# Patient Record
Sex: Male | Born: 1945
Health system: Southern US, Community
[De-identification: ages and names within clinical notes are randomized; demographics above are authoritative.]

## PROBLEM LIST (undated history)

## (undated) DIAGNOSIS — K635 Polyp of colon: Secondary | ICD-10-CM

## (undated) DIAGNOSIS — I10 Essential (primary) hypertension: Secondary | ICD-10-CM

## (undated) DIAGNOSIS — F17200 Nicotine dependence, unspecified, uncomplicated: Secondary | ICD-10-CM

## (undated) DIAGNOSIS — F1020 Alcohol dependence, uncomplicated: Secondary | ICD-10-CM

## (undated) DIAGNOSIS — E269 Hyperaldosteronism, unspecified: Secondary | ICD-10-CM

## (undated) DIAGNOSIS — I714 Abdominal aortic aneurysm, without rupture, unspecified: Secondary | ICD-10-CM

## (undated) DIAGNOSIS — K861 Other chronic pancreatitis: Secondary | ICD-10-CM

## (undated) DIAGNOSIS — I471 Supraventricular tachycardia: Secondary | ICD-10-CM

## (undated) DIAGNOSIS — E119 Type 2 diabetes mellitus without complications: Secondary | ICD-10-CM

## (undated) DIAGNOSIS — I4719 Other supraventricular tachycardia: Secondary | ICD-10-CM

## (undated) DIAGNOSIS — I739 Peripheral vascular disease, unspecified: Secondary | ICD-10-CM

## (undated) DIAGNOSIS — J9611 Chronic respiratory failure with hypoxia: Secondary | ICD-10-CM

## (undated) DIAGNOSIS — K219 Gastro-esophageal reflux disease without esophagitis: Secondary | ICD-10-CM

## (undated) DIAGNOSIS — K5904 Chronic idiopathic constipation: Secondary | ICD-10-CM

## (undated) DIAGNOSIS — I2510 Atherosclerotic heart disease of native coronary artery without angina pectoris: Secondary | ICD-10-CM

## (undated) DIAGNOSIS — C61 Malignant neoplasm of prostate: Secondary | ICD-10-CM

## (undated) HISTORY — DX: Malignant neoplasm of prostate: C61

## (undated) HISTORY — DX: Hyperaldosteronism, unspecified: E26.9

## (undated) HISTORY — DX: Other supraventricular tachycardia: I47.19

## (undated) HISTORY — DX: Alcohol dependence, uncomplicated: F10.20

## (undated) HISTORY — DX: Chronic respiratory failure with hypoxia: J96.11

## (undated) HISTORY — DX: Type 2 diabetes mellitus without complications: E11.9

## (undated) HISTORY — DX: Abdominal aortic aneurysm, without rupture: I71.4

## (undated) HISTORY — DX: Nicotine dependence, unspecified, uncomplicated: F17.200

## (undated) HISTORY — DX: Abdominal aortic aneurysm, without rupture, unspecified: I71.40

## (undated) HISTORY — DX: Supraventricular tachycardia: I47.1

## (undated) HISTORY — DX: Polyp of colon: K63.5

## (undated) HISTORY — DX: Atherosclerotic heart disease of native coronary artery without angina pectoris: I25.10

## (undated) HISTORY — DX: Chronic idiopathic constipation: K59.04

## (undated) HISTORY — DX: Peripheral vascular disease, unspecified: I73.9

## (undated) HISTORY — DX: Essential (primary) hypertension: I10

## (undated) HISTORY — DX: Gastro-esophageal reflux disease without esophagitis: K21.9

## (undated) HISTORY — DX: Other chronic pancreatitis: K86.1

## (undated) NOTE — *Deleted (*Deleted)
Nutrition Follow-up  DOCUMENTATION CODES:   Not applicable  INTERVENTION:  ***   NUTRITION DIAGNOSIS:   Inadequate oral intake related to poor appetite as evidenced by meal completion < 50%.  ***  GOAL:   Patient will meet greater than or equal to 90% of their needs  ***  MONITOR:   PO intake, Supplement acceptance, Labs, I & O's, Weight trends  REASON FOR ASSESSMENT:   Malnutrition Screening Tool    ASSESSMENT:   Pt admitted with ischemic stroke. PMH includes chronic hypoxemic respiratory failure 2/2 underlying COPD on 3L O2 at baseline, HTN, HLD, GERD, tobacco abuse, CAD, type 2 DM, PVD.  At baseline, pt eats 2 meals per day -- cereal for breakfast and a "complete meal" for dinner that is pre-prepared and consists of a protein, carbohydrate and vegetable. Pt denies any changes to his appetite PTA. Pt states he has been consuming supplements.   NUTRITION - FOCUSED PHYSICAL EXAM:  {RD Focused Exam List:21252}  Diet Order:   Diet Order            Diet - low sodium heart healthy           Diet 2 gram sodium Room service appropriate? Yes; Fluid consistency: Thin  Diet effective now                 EDUCATION NEEDS:   No education needs have been identified at this time  Skin:  Skin Assessment: Reviewed RN Assessment  Last BM:  PTA  Height:   Ht Readings from Last 1 Encounters:  10/13/20 6' (1.829 m)    Weight:   Wt Readings from Last 1 Encounters:  10/13/20 84.4 kg    Ideal Body Weight:     BMI:  Body mass index is 25.24 kg/m.  Estimated Nutritional Needs:   Kcal:  2000-2200  Protein:  100-110 grams  Fluid:  >/=2L/d    ***

## (undated) NOTE — *Deleted (*Deleted)
Patient arrived on the unit alert and oriented, with azithromycin infusing. Patient was settled and oriented to the room. Vital signs and NIH done.

## (undated) NOTE — *Deleted (*Deleted)
Physical Medicine and Rehabilitation Admission H&P    Chief Complaint  Patient presents with  . Functional decline     HPI: Brandon [name] is a 4 year old male with history of Chronic hypoxemic respiratory failure-3 L oxygen dependent, T2DM, CAD,PAT, PVD.  chronic alcoholism with chronic pancreatitis who was admitted on 10/13/20 with SOB with hypoxia, onset of LLE weakness with significant BLE edema. He was found to have AECOPD treated with nebs, Zithromax, diuretics and solumedrol. He was also found to have dysmetria with left finger to nose. CT head done showed acute/subacute hypodensity in right corona radiata as well as incidental finding of dehiscence  of bony external auditory canal--CT temporal bone on outpatient basis recommended.  CTA head negative for LVO. MRI brain done revealing multiple small acute infarcts in both hemispheres question embolic in nature. 2D echo showed EF 60-65% with moderate LVH, atrial septal aneurysm, abnormal aortic valve with moderate calcification of aortic valve and bulky LVOT calcification along base of mitral leaflet that could be cardiac source of stroke.    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  ROS    Past Medical History:  Diagnosis Date  . Abdominal aortic aneurysm without rupture (HCC)   . CAD (coronary artery disease)    severe on chest CT   . Chronic alcoholism (HCC)   . Chronic idiopathic constipation   . Chronic pancreatitis (HCC)   . Chronic respiratory failure with hypoxia (HCC)   . Essential hypertension   . GERD (gastroesophageal reflux disease)   . Hyperaldosteronism (HCC)   . Malignant neoplasm of prostate (HCC)   . Nicotine dependence   . Paroxysmal atrial tachycardia (HCC)   . Peripheral vascular disease (HCC)   . Serrated polyp of colon   . Type 2 diabetes mellitus (HCC)     Past Surgical History:   Procedure Laterality Date  . PROSTATECTOMY  12/2012    Family History  Problem Relation Age of Onset  . Hypertension Mother   . Hypertension Father     Social History:  reports that he has been smoking cigarettes. He has been smoking about 0.50 packs per day. He has never used smokeless tobacco. He reports previous alcohol use. No history on file for drug use.    Allergies  Allergen Reactions  . Furosemide Injection [Furosemide] Other (See Comments)    swelling  . Hctz [Hydrochlorothiazide]     Medications Prior to Admission  Medication Sig Dispense Refill  . albuterol (VENTOLIN HFA) 108 (90 Base) MCG/ACT inhaler Inhale 2 puffs into the lungs every 6 (six) hours as needed for wheezing or shortness of breath.     Marland Kitchen aspirin 81 MG EC tablet Take 81 mg by mouth daily.    Marland Kitchen atorvastatin (LIPITOR) 40 MG tablet Take 40 mg by mouth at bedtime.     . cilostazol (PLETAL) 100 MG tablet Take 50 mg by mouth 2 (two) times daily.     . enalapril (VASOTEC) 20 MG tablet Take 20 mg by mouth 2 (two) times daily.     . Fluticasone-Salmeterol (ADVAIR) 250-50 MCG/DOSE AEPB Inhale 1 puff into the lungs 2 (two) times daily.    . insulin aspart protamine - aspart (NOVOLOG 70/30 MIX) (70-30) 100 UNIT/ML FlexPen Inject 45 Units into the skin 2 (two) times daily with a meal.     . metFORMIN (GLUCOPHAGE) 1000 MG tablet Take 1,000 mg  by mouth 2 (two) times daily with a meal.     . metoprolol succinate (TOPROL-XL) 100 MG 24 hr tablet Take 150 mg by mouth in the morning and at bedtime.     . nortriptyline (PAMELOR) 50 MG capsule Take 50 mg by mouth at bedtime.     . Omega-3 1000 MG CAPS Take 1,000 mg by mouth in the morning and at bedtime.     Marland Kitchen omeprazole (PRILOSEC) 20 MG capsule Take 20 mg by mouth daily.     Marland Kitchen oxybutynin (DITROPAN-XL) 10 MG 24 hr tablet Take 10 mg by mouth daily.    Marland Kitchen spironolactone (ALDACTONE) 25 MG tablet Take 25 mg by mouth 2 (two) times daily before a meal.     . Tiotropium Bromide  Monohydrate 2.5 MCG/ACT AERS Inhale 2 puffs into the lungs daily.    Marland Kitchen KETOROLAC TROMETHAMINE, PF, OP Apply 1 drop to eye in the morning, at noon, in the evening, and at bedtime.      Drug Regimen Review { DRUG REGIMEN [id number]}  Home: Home Living Family/patient expects to be discharged to:: Private residence Living Arrangements: Alone Available Help at Discharge: Family Type of Home: House Home Access: Stairs to enter [employer] of Steps: 3 (3 steps in front; 4 steps on side) Entrance Stairs-Rails: Right, Left, Can reach both (rails on both sides in front; rail on left side on the side) Home Layout: One level Bathroom Shower/Tub: Engineer, manufacturing systems: Standard Bathroom Accessibility: Yes Home Equipment: Grab bars - tub/shower, None  Lives With: Alone   Functional History: Prior Function Level of Independence: Needs assistance Gait / Transfers Assistance Needed: independent with ambulation ADL's / Homemaking Assistance Needed: Pt is able to perform ADLs mod I.  Pt's daughter visits every 2 weeks and assists with finances, groceries and cleaning.  Pt manges his own meds, and prepares his own meals.  He does drive  Comments: Drives. No falls. Does not cook much. Wears 3.5 L 02 at home  Functional Status:  Mobility: Bed Mobility Overal bed mobility: Needs Assistance Bed Mobility: Supine to Sit Supine to sit: Supervision General bed mobility comments: use of rail, increased time to remove covers from LE's.  Transfers Overall transfer level: Needs assistance Equipment used: Rolling walker (2 wheeled) Transfers: Sit to/from Stand Sit to Stand: Min assist Stand pivot transfers: Mod assist General transfer comment: worked on standing from bed with hands on knees. Pt unable to complete without mod A with first trial but improved with practice and was able to complete with min 10% second 2 times.  Ambulation/Gait Ambulation/Gait assistance: Min assist, +2  safety/equipment Gait Distance (Feet): 40 Feet (2x) Assistive device: Rolling walker (2 wheeled) Gait Pattern/deviations: Step-through pattern, Decreased stride length, Trunk flexed General Gait Details: pt improved gait pattern today and did not cross over with turning. Initiated standing activity without UE support but pt could not safely maintain standing to work on ambulation without UE support. SpO2 84% after ambulation on 4L O2 Gait velocity: decreased Gait velocity interpretation: <1.8 ft/sec, indicate of risk for recurrent falls    ADL: ADL Overall ADL's : Needs assistance/impaired Eating/Feeding: Independent Grooming: Wash/dry hands, Wash/dry face, Oral care, Brushing hair, Minimal assistance, Standing Upper Body Bathing: Set up, Sitting Lower Body Bathing: Minimal assistance, Sit to/from stand Upper Body Dressing : Set up, Supervision/safety, Sitting Lower Body Dressing: Minimal assistance, Sit to/from stand Toilet Transfer: Moderate assistance, Ambulation, Comfort height toilet, Grab bars, RW Toilet Transfer Details (indicate cue type and  reason): Pt with LOB when turning requiring mod A to recover  Toileting- Clothing Manipulation and Hygiene: Minimal assistance, Sit to/from stand Functional mobility during ADLs: Minimal assistance, Rolling walker  Cognition: Cognition Overall Cognitive Status: Within Functional Limits for tasks assessed Arousal/Alertness: Awake/alert Orientation Level: Oriented X4 Attention: Sustained Sustained Attention: Appears intact Memory: Appears intact Memory Recall Sock: Without Cue Memory Recall Blue: Without Cue Memory Recall Bed: Without Cue Awareness: Appears intact Problem Solving: Appears intact Safety/Judgment: Appears intact Cognition Arousal/Alertness: Awake/alert Behavior During Therapy: WFL for tasks assessed/performed Overall Cognitive Status: Within Functional Limits for tasks assessed General Comments: pt remembered  therapists name from yesterday and was able to have higher level conversation about discharge options  Physical Exam: Blood pressure 109/69, pulse 78, temperature 97.8 F (36.6 C), temperature source Oral, resp. rate 16, height 6' (1.829 m), weight 84.4 kg, SpO2 100 %. Physical Exam  Results for orders placed or performed during the hospital encounter of 10/13/20 (from the past 48 hour(s))  Glucose, capillary     Status: Abnormal   Collection Time: 10/16/20 12:09 PM  Result Value Ref Range   Glucose-Capillary 103 (H) 70 - 99 mg/dL    Comment: Glucose reference range applies only to samples taken after fasting for at least 8 hours.  Glucose, capillary     Status: Abnormal   Collection Time: 10/16/20  3:56 PM  Result Value Ref Range   Glucose-Capillary 187 (H) 70 - 99 mg/dL    Comment: Glucose reference range applies only to samples taken after fasting for at least 8 hours.  Glucose, capillary     Status: Abnormal   Collection Time: 10/16/20 11:51 PM  Result Value Ref Range   Glucose-Capillary 108 (H) 70 - 99 mg/dL    Comment: Glucose reference range applies only to samples taken after fasting for at least 8 hours.  Glucose, capillary     Status: Abnormal   Collection Time: 10/17/20  6:03 AM  Result Value Ref Range   Glucose-Capillary 128 (H) 70 - 99 mg/dL    Comment: Glucose reference range applies only to samples taken after fasting for at least 8 hours.   Comment 1 Notify RN    Comment 2 Document in Chart   Glucose, capillary     Status: Abnormal   Collection Time: 10/17/20 12:01 PM  Result Value Ref Range   Glucose-Capillary 140 (H) 70 - 99 mg/dL    Comment: Glucose reference range applies only to samples taken after fasting for at least 8 hours.  Glucose, capillary     Status: Abnormal   Collection Time: 10/17/20  4:47 PM  Result Value Ref Range   Glucose-Capillary 161 (H) 70 - 99 mg/dL    Comment: Glucose reference range applies only to samples taken after fasting for at  least 8 hours.  Glucose, capillary     Status: Abnormal   Collection Time: 10/17/20  9:19 PM  Result Value Ref Range   Glucose-Capillary 181 (H) 70 - 99 mg/dL    Comment: Glucose reference range applies only to samples taken after fasting for at least 8 hours.  Glucose, capillary     Status: Abnormal   Collection Time: 10/18/20  6:08 AM  Result Value Ref Range   Glucose-Capillary 151 (H) 70 - 99 mg/dL    Comment: Glucose reference range applies only to samples taken after fasting for at least 8 hours.   No results found.     Medical Problem List and Plan: 1.  ***  secondary to ***  -patient may *** shower  -ELOS/Goals: *** 2.  Antithrombotics: -DVT/anticoagulation:  {VTE PROPHYLAXIS/ANTICOAGULATION - ZOXW:96045}  -antiplatelet therapy: *** 3. Pain Management: *** 4. Mood: ***  -antipsychotic agents: *** 5. Neuropsych: This patient *** capable of making decisions on *** own behalf. 6. Skin/Wound Care: *** 7. Fluids/Electrolytes/Nutrition: ***     ***  Jacquelynn Cree, PA-C 10/18/2020

## (undated) NOTE — *Deleted (*Deleted)
PMR Admission Coordinator Pre-Admission Assessment  Patient: Brandon [name] is an 80 y.o., male MRN: [id number] DOB: 11/05/1946 Height: 6' (182.9 cm) Weight: 84.4 kg              Insurance Information HMO: yes    PPO:      PCP:      IPA:      80/20:      OTHER:   [facility]***  SECONDARY: [employer]      Policy#: [id number]      Subscriber: patient CM Name: ***      Phone#: ***     Fax#: *** Pre-Cert#: [id number]      Employer: *** Benefits:  Phone #: n/[email or web address]     Name: *** Eff. Date: 01/01/2020-12/30/2020     Deduct: does not have for in-network providers      Out of Pocket Max: $3,900 ($0 met)      Life Max: NA  CIR: $295/day co-pay for days 1-6, $0/day co-pay for days 7-90      SNF: $0/day co-pay for days 1-20, $184/day co-pay for days 21-100; limited to 100 days /cal yr Outpatient: $295 per visit     Co-Pay:  Home Health: 100%; limited by medical necessity      Co-Pay: 0% DME: 80%      Co-Pay: 20% Providers: Editor, commissioning:       Phone#:   The Data processing manager" for patients in Inpatient Rehabilitation Facilities with attached "Privacy Act Statement-Health Care Records" was provided and verbally reviewed with: {CHL IP Patient Family YQ:[id number]}  Emergency Contact Information Contact Information    Name Relation Home Work Mobile   Raymond Gurney Daughter   [phone]   Wynona Dove Other   [phone]     Current Medical History  Patient Admitting Diagnosis: ischemic stroke History of Present Illness: *** Complete NIHSS TOTAL: 1    Past Medical History  Past Medical History:  Diagnosis Date  . Abdominal aortic aneurysm without rupture (HCC)   . CAD (coronary artery disease)    severe on chest CT   . Chronic alcoholism (HCC)   . Chronic idiopathic constipation   . Chronic pancreatitis (HCC)   . Chronic respiratory failure with hypoxia (HCC)   . Essential hypertension    . GERD (gastroesophageal reflux disease)   . Hyperaldosteronism (HCC)   . Malignant neoplasm of prostate (HCC)   . Nicotine dependence   . Paroxysmal atrial tachycardia (HCC)   . Peripheral vascular disease (HCC)   . Serrated polyp of colon   . Type 2 diabetes mellitus (HCC)     Family History  family history includes Hypertension in his father and mother.  Prior Rehab/Hospitalizations:  Has the patient had prior rehab or hospitalizations prior to admission? Yes  Has the patient had major surgery during 100 days prior to admission? No  Current Medications   Current Facility-Administered Medications:  .   stroke: mapping our early stages of recovery book, , Does not apply, Once, Pahwani, Rinka R, MD .  acetaminophen (TYLENOL) tablet 650 mg, 650 mg, Oral, Q4H PRN **OR** acetaminophen (TYLENOL) 160 MG/5ML solution 650 mg, 650 mg, Per Tube, Q4H PRN **OR** acetaminophen (TYLENOL) suppository 650 mg, 650 mg, Rectal, Q4H PRN, Pahwani, Rinka R, MD .  aspirin EC tablet 81 mg, 81 mg, Oral, Daily, Biby, Sharon L, NP, 81 mg at 10/17/20 1117 .  atorvastatin (LIPITOR) tablet 40 mg, 40 mg, Oral, QHS, Biby,  Jani Files, NP, 40 mg at 10/16/20 2105 .  cilostazol (PLETAL) tablet 50 mg, 50 mg, Oral, BID, Pahwani, Ravi, MD, 50 mg at 10/17/20 1138 .  clopidogrel (PLAVIX) tablet 75 mg, 75 mg, Oral, Daily, Biby, Sharon L, NP, 75 mg at 10/17/20 1118 .  dextrose 50 % solution 50 mL, 1 ampule, Intravenous, Once, Fayrene Helper, PA-C .  feeding supplement (ENSURE ENLIVE / ENSURE PLUS) liquid 237 mL, 237 mL, Oral, BID BM, Pahwani, Rinka R, MD, 237 mL at 10/16/20 1640 .  ferrous sulfate tablet 325 mg, 325 mg, Oral, TID WC, Marvel Plan, MD, 325 mg at 10/17/20 1118 .  insulin aspart (novoLOG) injection 0-15 Units, 0-15 Units, Subcutaneous, TID WC, Pahwani, Rinka R, MD, 2 Units at 10/17/20 1230 .  insulin aspart (novoLOG) injection 0-5 Units, 0-5 Units, Subcutaneous, QHS, Pahwani, Rinka R, MD, 2 Units at 10/15/20 2215 .   insulin aspart protamine- aspart (NOVOLOG MIX 70/30) injection 40 Units, 40 Units, Subcutaneous, BID WC, Hughie Closs, MD, 40 Units at 10/17/20 0802 .  metoprolol succinate (TOPROL-XL) 24 hr tablet 150 mg, 150 mg, Oral, BH-qamhs, John Giovanni, MD, 150 mg at 10/17/20 0808 .  mometasone-formoterol (DULERA) 200-5 MCG/ACT inhaler 2 puff, 2 puff, Inhalation, BID, Hughie Closs, MD, 2 puff at 10/17/20 0800 .  multivitamin with minerals tablet 1 tablet, 1 tablet, Oral, Daily, Pahwani, Ravi, MD, 1 tablet at 10/17/20 1120 .  nortriptyline (PAMELOR) capsule 50 mg, 50 mg, Oral, QHS, Pahwani, Ravi, MD, 50 mg at 10/16/20 2114 .  oxybutynin (DITROPAN-XL) 24 hr tablet 10 mg, 10 mg, Oral, Daily, Pahwani, Ravi, MD, 10 mg at 10/17/20 1118 .  pantoprazole (PROTONIX) EC tablet 40 mg, 40 mg, Oral, Daily, Pahwani, Ravi, MD, 40 mg at 10/17/20 1117 .  sodium zirconium cyclosilicate (LOKELMA) packet 10 g, 10 g, Oral, Once, Pahwani, Rinka R, MD .  umeclidinium bromide (INCRUSE ELLIPTA) 62.5 MCG/INH 1 puff, 1 puff, Inhalation, Daily, Pahwani, Ravi, MD, 1 puff at 10/17/20 0800  Patients Current Diet:  Diet Order            Diet 2 gram sodium Room service appropriate? Yes; Fluid consistency: Thin  Diet effective now                 Precautions / Restrictions Precautions Precautions: Fall, Other (comment) Precaution Comments: watch 02 Restrictions Weight Bearing Restrictions: No   Has the patient had 2 or more falls or a fall with injury in the past year?No  Prior Activity Level Limited Community (1-2x/wk): driving, left house 1x/week  Prior Functional Level Prior Function Level of Independence: Needs assistance Gait / Transfers Assistance Needed: independent with ambulation ADL's / Homemaking Assistance Needed: Pt is able to perform ADLs mod I.  Pt's daughter visits every 2 weeks and assists with finances, groceries and cleaning.  Pt manges his own meds, and prepares his own meals.  He does drive   Comments: Drives. No falls. Does not cook much. Wears 3.5 L 02 at home  Self Care: Did the patient need help bathing, dressing, using the toilet or eating?  Independent  Indoor Mobility: Did the patient need assistance with walking from room to room (with or without device)? Independent  Stairs: Did the patient need assistance with internal or external stairs (with or without device)? Independent  Functional Cognition: Did the patient need help planning regular tasks such as shopping or remembering to take medications? Independent  Home Assistive Devices / Equipment Home Assistive Devices/Equipment: Dentures (specify type), Cane (specify quad  or straight) Home Equipment: Grab bars - tub/shower, None  Prior Device Use: Indicate devices/aids used by the patient prior to current illness, exacerbation or injury? None of the above  Current Functional Level Cognition  Arousal/Alertness: Awake/alert Overall Cognitive Status: No family/caregiver present to determine baseline cognitive functioning Orientation Level: Oriented X4 General Comments: Pt is a bit slow to respond but appropriate with conversation and able to relay detailed recent and past history Attention: Sustained Sustained Attention: Appears intact Memory: Appears intact Awareness: Appears intact Problem Solving: Appears intact Safety/Judgment: Appears intact    Extremity Assessment (includes Sensation/Coordination)  Upper Extremity Assessment: LUE deficits/detail LUE Deficits / Details: drift noted, grossly 4/5  LUE Coordination: decreased fine motor, decreased gross motor  Lower Extremity Assessment: Defer to PT evaluation LLE Deficits / Details: Grossly ~3+-4/5 throughout. Some decreased sensation in foot. LLE Sensation: decreased light touch LLE Coordination: decreased fine motor, decreased gross motor    ADLs  Overall ADL's : Needs assistance/impaired Eating/Feeding: Independent Grooming: Wash/dry hands, Wash/dry  face, Oral care, Brushing hair, Minimal assistance, Standing Upper Body Bathing: Set up, Sitting Lower Body Bathing: Minimal assistance, Sit to/from stand Upper Body Dressing : Set up, Supervision/safety, Sitting Lower Body Dressing: Minimal assistance, Sit to/from stand Toilet Transfer: Moderate assistance, Ambulation, Comfort height toilet, Grab bars, RW Toilet Transfer Details (indicate cue type and reason): Pt with LOB when turning requiring mod A to recover  Toileting- Clothing Manipulation and Hygiene: Minimal assistance, Sit to/from stand Functional mobility during ADLs: Minimal assistance, Rolling walker    Mobility  Overal bed mobility: Needs Assistance Bed Mobility: Supine to Sit Supine to sit: Supervision General bed mobility comments: Use of rail to assist and increased time, no dizziness today    Transfers  Overall transfer level: Needs assistance Equipment used: Rolling walker (2 wheeled) Transfers: Sit to/from Stand Sit to Stand: Min assist Stand pivot transfers: Mod assist General transfer comment: stood to RW from bed, vc's for hand placement and for widening BOS as pt stands with feet touching making him more unsteady    Ambulation / Gait / Stairs / Wheelchair Mobility  Ambulation/Gait Ambulation/Gait assistance: Min assist, +2 safety/equipment Gait Distance (Feet): 40 Feet (10' + 40' + 40') Assistive device: Rolling walker (2 wheeled) Gait Pattern/deviations: Step-through pattern, Decreased stride length, Narrow base of support General Gait Details: pt noted to have wide BOS last session and this session had very narrow base with occasional cross over with min A to correct to prevent fall. Pt especially had difficulty with changes in direction. Pt could not ambulate further than 40' before needing rest break due to DOE. Heavy reliance on RW. SPO2 upper 70's on 4L O2. Gait pattern complicated by BLE neuropathy with noted increased foot slap. This may be basline for  him? Gait velocity: decreased Gait velocity interpretation: <1.31 ft/sec, indicative of household ambulator    Posture / Balance Dynamic Sitting Balance Sitting balance - Comments: able to don/doff socks while sititng EOB  Balance Overall balance assessment: Needs assistance Sitting-balance support: Feet supported, No upper extremity supported Sitting balance-Leahy Scale: Good Sitting balance - Comments: able to don/doff socks while sititng EOB  Standing balance support: Single extremity supported Standing balance-Leahy Scale: Poor Standing balance comment: requires UE support for static standing     Special needs/care consideration Oxygen 4L HFNC, Skin Cracking: arm/leg; right/left; ecchymosis: arm/leg; right/left; lower, Diabetic management novoLOG 70/30 Mix: 40 units 2x/day; novoLOG: 0-5 units daily at bedtime; novoLOG: 015 units 3x daily with meals and Designated visitor  Molly Maduro, brother     Previous Surveyor, minerals (from acute therapy documentation) Living Arrangements: Alone  Lives With: Alone Available Help at Discharge: Family Type of Home: House Home Layout: One level Home Access: Stairs to enter Entrance Stairs-Rails: Right, Left, Can reach both (rails on both sides in front; rail on left side on the side) Entrance Stairs-Number of Steps: 3 (3 steps in front; 4 steps on side) Bathroom Shower/Tub: Engineer, manufacturing systems: Standard Bathroom Accessibility: Yes How Accessible: Accessible via walker Home Care Services: No  Discharge Living Setting Plans for Discharge Living Setting: Patient's home Type of Home at Discharge: House Discharge Home Layout: One level Discharge Home Access: Stairs to enter Entrance Stairs-Rails: Right, Left, Can reach both (rails on both sides in front; rail on left on side) Entrance Stairs-Number of Steps: 3 (3 steps in front; 4 steps on side) Discharge Bathroom Shower/Tub: Tub/shower unit Discharge Bathroom Toilet: Standard  Discharge Bathroom Accessibility: Yes How Accessible: Accessible via walker Does the patient have any problems obtaining your medications?: No  Social/Family/Support Systems     Goals     Decrease burden of Care through IP rehab admission: Na   Possible need for SNF placement upon discharge:NA   Patient Condition: {PATIENT'S CONDITION:22832}  Preadmission Screen Completed By:  Domingo Pulse, CCC-SLP, 10/17/2020 5:21 PM ______________________________________________________________________   Discussed status with Dr. Marland Kitchenon***at *** and received approval for admission today.  Admission Coordinator:  Domingo Pulse, time***/Date***

---

## 1998-05-02 ENCOUNTER — Inpatient Hospital Stay (HOSPITAL_COMMUNITY): Admission: EM | Admit: 1998-05-02 | Discharge: 1998-05-10 | Payer: Self-pay | Admitting: Emergency Medicine

## 1998-05-12 ENCOUNTER — Ambulatory Visit (HOSPITAL_COMMUNITY): Admission: RE | Admit: 1998-05-12 | Discharge: 1998-05-12 | Payer: Self-pay | Admitting: Internal Medicine

## 1998-05-13 ENCOUNTER — Encounter: Admission: RE | Admit: 1998-05-13 | Discharge: 1998-05-13 | Payer: Self-pay | Admitting: Internal Medicine

## 1998-05-18 ENCOUNTER — Emergency Department (HOSPITAL_COMMUNITY): Admission: EM | Admit: 1998-05-18 | Discharge: 1998-05-18 | Payer: Self-pay | Admitting: Emergency Medicine

## 1998-05-20 ENCOUNTER — Encounter: Admission: RE | Admit: 1998-05-20 | Discharge: 1998-05-20 | Payer: Self-pay | Admitting: Internal Medicine

## 1999-02-02 ENCOUNTER — Encounter: Admission: RE | Admit: 1999-02-02 | Discharge: 1999-02-02 | Payer: Self-pay | Admitting: Internal Medicine

## 1999-07-14 ENCOUNTER — Emergency Department (HOSPITAL_COMMUNITY): Admission: EM | Admit: 1999-07-14 | Discharge: 1999-07-14 | Payer: Self-pay | Admitting: Emergency Medicine

## 2000-01-24 ENCOUNTER — Encounter: Admission: RE | Admit: 2000-01-24 | Discharge: 2000-01-24 | Payer: Self-pay | Admitting: Internal Medicine

## 2000-01-26 ENCOUNTER — Encounter: Admission: RE | Admit: 2000-01-26 | Discharge: 2000-01-26 | Payer: Self-pay | Admitting: Internal Medicine

## 2000-07-05 ENCOUNTER — Encounter: Admission: RE | Admit: 2000-07-05 | Discharge: 2000-07-05 | Payer: Self-pay | Admitting: Internal Medicine

## 2000-07-19 ENCOUNTER — Encounter: Admission: RE | Admit: 2000-07-19 | Discharge: 2000-07-19 | Payer: Self-pay | Admitting: Internal Medicine

## 2000-08-15 ENCOUNTER — Emergency Department (HOSPITAL_COMMUNITY): Admission: EM | Admit: 2000-08-15 | Discharge: 2000-08-15 | Payer: Self-pay | Admitting: *Deleted

## 2000-12-20 ENCOUNTER — Encounter: Admission: RE | Admit: 2000-12-20 | Discharge: 2000-12-20 | Payer: Self-pay | Admitting: Internal Medicine

## 2001-10-20 ENCOUNTER — Encounter: Admission: RE | Admit: 2001-10-20 | Discharge: 2001-10-20 | Payer: Self-pay

## 2001-12-19 ENCOUNTER — Encounter: Admission: RE | Admit: 2001-12-19 | Discharge: 2001-12-19 | Payer: Self-pay | Admitting: Internal Medicine

## 2002-08-04 ENCOUNTER — Encounter: Admission: RE | Admit: 2002-08-04 | Discharge: 2002-08-04 | Payer: Self-pay | Admitting: Internal Medicine

## 2002-08-18 ENCOUNTER — Encounter: Admission: RE | Admit: 2002-08-18 | Discharge: 2002-08-18 | Payer: Self-pay | Admitting: Internal Medicine

## 2002-09-01 ENCOUNTER — Encounter: Admission: RE | Admit: 2002-09-01 | Discharge: 2002-09-01 | Payer: Self-pay | Admitting: Internal Medicine

## 2002-11-03 ENCOUNTER — Encounter: Admission: RE | Admit: 2002-11-03 | Discharge: 2002-11-03 | Payer: Self-pay | Admitting: Internal Medicine

## 2002-12-08 ENCOUNTER — Encounter: Admission: RE | Admit: 2002-12-08 | Discharge: 2002-12-08 | Payer: Self-pay | Admitting: Internal Medicine

## 2003-01-06 ENCOUNTER — Encounter: Admission: RE | Admit: 2003-01-06 | Discharge: 2003-01-06 | Payer: Self-pay | Admitting: Internal Medicine

## 2003-06-22 ENCOUNTER — Encounter: Admission: RE | Admit: 2003-06-22 | Discharge: 2003-06-22 | Payer: Self-pay | Admitting: Internal Medicine

## 2003-07-07 ENCOUNTER — Encounter: Admission: RE | Admit: 2003-07-07 | Discharge: 2003-07-07 | Payer: Self-pay | Admitting: Internal Medicine

## 2004-01-21 ENCOUNTER — Encounter: Admission: RE | Admit: 2004-01-21 | Discharge: 2004-01-21 | Payer: Self-pay | Admitting: Internal Medicine

## 2004-01-26 ENCOUNTER — Encounter: Admission: RE | Admit: 2004-01-26 | Discharge: 2004-01-26 | Payer: Self-pay | Admitting: Internal Medicine

## 2004-07-18 ENCOUNTER — Encounter: Admission: RE | Admit: 2004-07-18 | Discharge: 2004-07-18 | Payer: Self-pay | Admitting: Internal Medicine

## 2004-11-15 ENCOUNTER — Ambulatory Visit: Payer: Self-pay | Admitting: Internal Medicine

## 2004-11-16 ENCOUNTER — Ambulatory Visit: Payer: Self-pay | Admitting: Internal Medicine

## 2004-12-07 ENCOUNTER — Ambulatory Visit: Payer: Self-pay | Admitting: Internal Medicine

## 2004-12-15 ENCOUNTER — Ambulatory Visit: Payer: Self-pay | Admitting: Internal Medicine

## 2005-01-15 ENCOUNTER — Ambulatory Visit: Payer: Self-pay | Admitting: Internal Medicine

## 2007-09-12 ENCOUNTER — Emergency Department (HOSPITAL_COMMUNITY): Admission: EM | Admit: 2007-09-12 | Discharge: 2007-09-12 | Payer: Self-pay | Admitting: General Surgery

## 2008-01-23 ENCOUNTER — Emergency Department (HOSPITAL_COMMUNITY): Admission: EM | Admit: 2008-01-23 | Discharge: 2008-01-23 | Payer: Self-pay | Admitting: Emergency Medicine

## 2008-03-15 ENCOUNTER — Emergency Department (HOSPITAL_COMMUNITY): Admission: EM | Admit: 2008-03-15 | Discharge: 2008-03-15 | Payer: Self-pay | Admitting: Emergency Medicine

## 2008-07-21 ENCOUNTER — Emergency Department (HOSPITAL_COMMUNITY): Admission: EM | Admit: 2008-07-21 | Discharge: 2008-07-21 | Payer: Self-pay | Admitting: Emergency Medicine

## 2011-09-20 LAB — POCT CARDIAC MARKERS
CKMB, poc: 2.3
Myoglobin, poc: 93.8
Operator id: 192351

## 2011-09-20 LAB — I-STAT 8, (EC8 V) (CONVERTED LAB)
Glucose, Bld: 281 — ABNORMAL HIGH
Potassium: 3.5
TCO2: 26
pCO2, Ven: 36.1 — ABNORMAL LOW
pH, Ven: 7.448 — ABNORMAL HIGH

## 2011-09-20 LAB — POCT I-STAT CREATININE: Operator id: 192351

## 2011-09-24 LAB — POCT CARDIAC MARKERS: Troponin i, poc: <0.05

## 2011-09-24 LAB — I-STAT 8, (EC8 V) (CONVERTED LAB)
Acid-Base Excess: 2
Bicarbonate: 25.2 — ABNORMAL HIGH
HCT: 46
Hemoglobin: 15.6
Operator id: 146091
Sodium: 139
TCO2: 26

## 2011-09-24 LAB — CBC
MCHC: 34.3
MCV: 91.8
Platelets: 304
WBC: 12.4 — ABNORMAL HIGH

## 2011-09-24 LAB — DIFFERENTIAL
Basophils Relative: 0
Eosinophils Absolute: 0.1
Neutrophils Relative %: 72

## 2011-09-28 LAB — BASIC METABOLIC PANEL
GFR calc Af Amer: >60
GFR calc non Af Amer: 58 — ABNORMAL LOW
Potassium: 3.7
Sodium: 137

## 2011-09-28 LAB — POCT CARDIAC MARKERS
Myoglobin, poc: 62.4
Operator id: 277751
Operator id: 288831

## 2011-09-28 LAB — POCT I-STAT, CHEM 8
BUN: 11
Chloride: 108
Creatinine, Ser: 1.1
HCT: 45
Hemoglobin: 15.3
Potassium: 3.8
Sodium: 137
Sodium: 141
TCO2: 23

## 2011-09-28 LAB — PROTIME-INR
INR: 1.1
Prothrombin Time: 14

## 2011-09-28 LAB — CBC
MCV: 93.3
Platelets: 336
WBC: 12.4 — ABNORMAL HIGH

## 2011-09-28 LAB — DIFFERENTIAL
Basophils Absolute: 0.1
Eosinophils Absolute: 0.1
Eosinophils Relative: 1
Monocytes Absolute: 1
Neutrophils Relative %: 61

## 2011-09-28 LAB — APTT: aPTT: 36

## 2011-10-12 LAB — I-STAT 8, (EC8 V) (CONVERTED LAB)
BUN: 11
Chloride: 104
Potassium: 2.8 — ABNORMAL LOW
pCO2, Ven: 42.8 — ABNORMAL LOW
pH, Ven: 7.434 — ABNORMAL HIGH

## 2011-10-12 LAB — POCT I-STAT CREATININE
Creatinine, Ser: 0.8
Operator id: 272551

## 2012-12-31 HISTORY — PX: PROSTATECTOMY: SHX69

## 2020-05-10 ENCOUNTER — Telehealth: Payer: Self-pay | Admitting: Gastroenterology

## 2020-05-10 NOTE — Telephone Encounter (Signed)
Hi Dr. Osker Mason,  DOD  We received a referral from [facility] for a repeat colon. Patient had colonoscopy done in 2015. I requested Op\Path report, will be sending them to you along with other records for you to review.     Please advise on scheduling.  Thank you

## 2020-05-12 NOTE — Telephone Encounter (Signed)
Dr. Havery Moros has approved patient to schedule for colonoscopy. Called patient left voicemail

## 2020-05-13 ENCOUNTER — Encounter: Payer: Self-pay | Admitting: Gastroenterology

## 2020-06-22 ENCOUNTER — Telehealth: Payer: Self-pay

## 2020-06-22 NOTE — Telephone Encounter (Signed)
Dr. Havery Moros;  This pt is a complex with multiple dx/co-morbidities as indicated in his records from the [place].    Please direct [place] as to whether you are ok with him being a direct to [facility] or if he needs an office visit first.  I will run your information up to your desk  Thanks for the review.

## 2020-06-23 NOTE — Telephone Encounter (Signed)
I am out of the office this week but will be back next week, review records, and make recommendation. Thanks

## 2020-06-28 NOTE — Telephone Encounter (Signed)
Dr. Havery Moros:  Thanks for [employer] ran them up to you and she mentioned something about a box that we are to put things in for the doctors.  We do not have the record in previsit.   Due to the complicated medical hx that needed to be reviewed, do you want to schedule an office visit?  Thanks so much.

## 2020-06-28 NOTE — Telephone Encounter (Signed)
Yes if he has a complicated medical history may be best to see him in the office first to decide how to proceed. Thanks

## 2020-06-28 NOTE — Telephone Encounter (Signed)
Terri I don't see any records in my inbox regarding this patient. If they can be placed on my desk I'm happy to review and make recommendations. thanks

## 2020-06-28 NOTE — Telephone Encounter (Signed)
Dr. Havery Moros:  Previsit is  7/1 for this pt.  Can you please review records and advise.  Thank you

## 2020-06-29 NOTE — Telephone Encounter (Signed)
Contacted pt to schedule office visit for new pt with Dr. Havery Moros D/T complicated medical hx.  Schedule for Tue 08/30/20 @ 1:40 pm.  Pt wrote new appt down.  PV and procedure cancelled at this time.

## 2020-07-14 ENCOUNTER — Encounter: Payer: Self-pay | Admitting: Gastroenterology

## 2020-08-24 ENCOUNTER — Telehealth: Payer: Self-pay

## 2020-08-24 NOTE — Telephone Encounter (Signed)
Called the [place] at [phone] ext: [phone] and RErequested pt's records to be refaxed as we have misplaced them in our office.   Last four: [id number].  Patient has had a colonoscopy within the year.  Requested that be sent along with all patient's medical Hx.

## 2020-08-29 NOTE — Progress Notes (Signed)
[facility] records [facility]

## 2020-08-29 NOTE — Progress Notes (Signed)
Records from [place] loaded. Still waiting on colonoscopy records from 2015 and 2020 (may have not been completed due to poor prep)

## 2020-08-30 ENCOUNTER — Ambulatory Visit: Payer: Non-veteran care | Admitting: Gastroenterology

## 2020-08-30 NOTE — Progress Notes (Deleted)
HPI :   Colonoscopy 02/01/2019 - poor prep, procedure aborted   Past Medical History:  Diagnosis Date   Abdominal aortic aneurysm without rupture (HCC)    CAD (coronary artery disease)    severe on chest CT    Chronic alcoholism (HCC)    Chronic idiopathic constipation    Chronic pancreatitis (HCC)    Chronic respiratory failure with hypoxia (HCC)    Essential hypertension    GERD (gastroesophageal reflux disease)    Hyperaldosteronism (HCC)    Malignant neoplasm of prostate (HCC)    Nicotine dependence    Paroxysmal atrial tachycardia (HCC)    Peripheral vascular disease (HCC)    Serrated polyp of colon    Type 2 diabetes mellitus (HCC)      *** The histories are not reviewed yet. Please review them in the "History" navigator section and refresh this [place]. No family history on file. Social History   Tobacco Use   Smoking status: Current Every Day Smoker    Packs/day: 0.50    Types: Cigarettes  Substance Use Topics   Alcohol use: Not Currently   Drug use: Not on file   Current Outpatient Medications  Medication Sig Dispense Refill   albuterol (VENTOLIN HFA) 108 (90 Base) MCG/ACT inhaler INHALE 2 PUFFS BY MOUTH FOUR TIMES A DAY AS NEEDED     amLODipine (NORVASC) 10 MG tablet Take by mouth.     aspirin 81 MG EC tablet Take 81 mg by mouth daily.     atorvastatin (LIPITOR) 40 MG tablet TAKE ONE TABLET BY MOUTH AT BEDTIME ** NEW CHOLESTEROL MEDICATION     budesonide-formoterol (SYMBICORT) 160-4.5 MCG/ACT inhaler INHALE 2 PUFFS BY MOUTH TWICE A DAY (RINSE MOUTH WELL WITH WATER AFTER EACH USE)     cilostazol (PLETAL) 100 MG tablet Take by mouth.     enalapril (VASOTEC) 20 MG tablet Take by mouth.     insulin aspart protamine - aspart (NOVOLOG 70/30 MIX) (70-30) 100 UNIT/ML FlexPen INJECT 45 UNITS SUBCUTANEOUSLY TWICE A DAY FOR DIABETES     KETOROLAC TROMETHAMINE, PF, OP Apply 1 drop to eye in the morning, at noon, in the evening, and at  bedtime.     metFORMIN (GLUCOPHAGE) 1000 MG tablet Take by mouth.     metoprolol succinate (TOPROL-XL) 100 MG 24 hr tablet Take 1 and 1/2 (half)  tablet by mouth 2 times a day.     nortriptyline (PAMELOR) 50 MG capsule Take by mouth.     Omega-3 1000 MG CAPS Take by mouth.     omeprazole (PRILOSEC) 20 MG capsule Take by mouth.     oxybutynin (DITROPAN-XL) 10 MG 24 hr tablet Take 10 mg by mouth daily.     spironolactone (ALDACTONE) 25 MG tablet TAKE ONE TABLET BY MOUTH TWICE A DAY BEFORE MEALS     Tiotropium Bromide Monohydrate 2.5 MCG/ACT AERS Inhale 2 puffs into the lungs daily.     No current facility-administered medications for this visit.   Allergies  Allergen Reactions   Furosemide Injection [Furosemide]    Hctz [Hydrochlorothiazide]      Review of Systems: All systems reviewed and negative except where noted in HPI.    No results found.  Physical Exam: There were no vitals taken for this visit. Constitutional: Pleasant,well-developed, ***male in no acute distress. HEENT: Normocephalic and atraumatic. Conjunctivae are normal. No scleral icterus. Neck supple.  Cardiovascular: Normal rate, regular rhythm.  Pulmonary/chest: Effort normal and breath sounds normal. No wheezing, rales or rhonchi.  Abdominal: Soft, nondistended, nontender. Bowel sounds active throughout. There are no masses palpable. No hepatomegaly. Extremities: no edema Lymphadenopathy: No cervical adenopathy noted. Neurological: Alert and oriented to person place and time. Skin: Skin is warm and dry. No rashes noted. Psychiatric: Normal mood and affect. Behavior is normal.   ASSESSMENT AND PLAN:  No ref. provider found

## 2020-10-13 ENCOUNTER — Emergency Department (HOSPITAL_COMMUNITY): Payer: No Typology Code available for payment source

## 2020-10-13 ENCOUNTER — Inpatient Hospital Stay (HOSPITAL_COMMUNITY): Payer: No Typology Code available for payment source

## 2020-10-13 ENCOUNTER — Encounter (HOSPITAL_COMMUNITY): Payer: Self-pay

## 2020-10-13 ENCOUNTER — Inpatient Hospital Stay (HOSPITAL_COMMUNITY)
Admission: EM | Admit: 2020-10-13 | Discharge: 2020-10-21 | DRG: 064 | Disposition: A | Discharge status: Skilled Nursing Facility | Payer: No Typology Code available for payment source | Attending: Internal Medicine | Admitting: Internal Medicine

## 2020-10-13 ENCOUNTER — Other Ambulatory Visit: Payer: Self-pay

## 2020-10-13 DIAGNOSIS — G8334 Monoplegia, unspecified affecting left nondominant side: Secondary | ICD-10-CM | POA: Diagnosis not present

## 2020-10-13 DIAGNOSIS — J449 Chronic obstructive pulmonary disease, unspecified: Secondary | ICD-10-CM | POA: Diagnosis not present

## 2020-10-13 DIAGNOSIS — I2510 Atherosclerotic heart disease of native coronary artery without angina pectoris: Secondary | ICD-10-CM | POA: Diagnosis present

## 2020-10-13 DIAGNOSIS — E1142 Type 2 diabetes mellitus with diabetic polyneuropathy: Secondary | ICD-10-CM | POA: Diagnosis present

## 2020-10-13 DIAGNOSIS — Z7984 Long term (current) use of oral hypoglycemic drugs: Secondary | ICD-10-CM

## 2020-10-13 DIAGNOSIS — I10 Essential (primary) hypertension: Secondary | ICD-10-CM | POA: Diagnosis present

## 2020-10-13 DIAGNOSIS — F1020 Alcohol dependence, uncomplicated: Secondary | ICD-10-CM | POA: Diagnosis present

## 2020-10-13 DIAGNOSIS — E875 Hyperkalemia: Secondary | ICD-10-CM | POA: Diagnosis not present

## 2020-10-13 DIAGNOSIS — Z8546 Personal history of malignant neoplasm of prostate: Secondary | ICD-10-CM

## 2020-10-13 DIAGNOSIS — R531 Weakness: Secondary | ICD-10-CM

## 2020-10-13 DIAGNOSIS — I110 Hypertensive heart disease with heart failure: Secondary | ICD-10-CM | POA: Diagnosis present

## 2020-10-13 DIAGNOSIS — Z20822 Contact with and (suspected) exposure to covid-19: Secondary | ICD-10-CM | POA: Diagnosis present

## 2020-10-13 DIAGNOSIS — E1151 Type 2 diabetes mellitus with diabetic peripheral angiopathy without gangrene: Secondary | ICD-10-CM | POA: Diagnosis not present

## 2020-10-13 DIAGNOSIS — I712 Thoracic aortic aneurysm, without rupture: Secondary | ICD-10-CM | POA: Diagnosis present

## 2020-10-13 DIAGNOSIS — I639 Cerebral infarction, unspecified: Secondary | ICD-10-CM | POA: Diagnosis not present

## 2020-10-13 DIAGNOSIS — I6340 Cerebral infarction due to embolism of unspecified cerebral artery: Secondary | ICD-10-CM | POA: Diagnosis present

## 2020-10-13 DIAGNOSIS — E871 Hypo-osmolality and hyponatremia: Secondary | ICD-10-CM | POA: Diagnosis present

## 2020-10-13 DIAGNOSIS — I6389 Other cerebral infarction: Secondary | ICD-10-CM | POA: Diagnosis not present

## 2020-10-13 DIAGNOSIS — Z8249 Family history of ischemic heart disease and other diseases of the circulatory system: Secondary | ICD-10-CM

## 2020-10-13 DIAGNOSIS — J441 Chronic obstructive pulmonary disease with (acute) exacerbation: Secondary | ICD-10-CM

## 2020-10-13 DIAGNOSIS — R29810 Facial weakness: Secondary | ICD-10-CM | POA: Diagnosis present

## 2020-10-13 DIAGNOSIS — D509 Iron deficiency anemia, unspecified: Secondary | ICD-10-CM | POA: Diagnosis present

## 2020-10-13 DIAGNOSIS — R0602 Shortness of breath: Secondary | ICD-10-CM

## 2020-10-13 DIAGNOSIS — R29703 NIHSS score 3: Secondary | ICD-10-CM | POA: Diagnosis not present

## 2020-10-13 DIAGNOSIS — Z9079 Acquired absence of other genital organ(s): Secondary | ICD-10-CM

## 2020-10-13 DIAGNOSIS — Z716 Tobacco abuse counseling: Secondary | ICD-10-CM

## 2020-10-13 DIAGNOSIS — I714 Abdominal aortic aneurysm, without rupture: Secondary | ICD-10-CM | POA: Diagnosis present

## 2020-10-13 DIAGNOSIS — E119 Type 2 diabetes mellitus without complications: Secondary | ICD-10-CM

## 2020-10-13 DIAGNOSIS — R278 Other lack of coordination: Secondary | ICD-10-CM | POA: Diagnosis present

## 2020-10-13 DIAGNOSIS — I5031 Acute diastolic (congestive) heart failure: Secondary | ICD-10-CM | POA: Diagnosis present

## 2020-10-13 DIAGNOSIS — E785 Hyperlipidemia, unspecified: Secondary | ICD-10-CM | POA: Diagnosis not present

## 2020-10-13 DIAGNOSIS — K861 Other chronic pancreatitis: Secondary | ICD-10-CM | POA: Diagnosis present

## 2020-10-13 DIAGNOSIS — Z23 Encounter for immunization: Secondary | ICD-10-CM

## 2020-10-13 DIAGNOSIS — K219 Gastro-esophageal reflux disease without esophagitis: Secondary | ICD-10-CM | POA: Diagnosis not present

## 2020-10-13 DIAGNOSIS — F17210 Nicotine dependence, cigarettes, uncomplicated: Secondary | ICD-10-CM | POA: Diagnosis present

## 2020-10-13 DIAGNOSIS — Z9981 Dependence on supplemental oxygen: Secondary | ICD-10-CM

## 2020-10-13 DIAGNOSIS — K5904 Chronic idiopathic constipation: Secondary | ICD-10-CM | POA: Diagnosis present

## 2020-10-13 DIAGNOSIS — Z7982 Long term (current) use of aspirin: Secondary | ICD-10-CM

## 2020-10-13 DIAGNOSIS — Z955 Presence of coronary angioplasty implant and graft: Secondary | ICD-10-CM

## 2020-10-13 DIAGNOSIS — J9621 Acute and chronic respiratory failure with hypoxia: Secondary | ICD-10-CM | POA: Diagnosis present

## 2020-10-13 DIAGNOSIS — E1165 Type 2 diabetes mellitus with hyperglycemia: Secondary | ICD-10-CM | POA: Diagnosis not present

## 2020-10-13 DIAGNOSIS — E269 Hyperaldosteronism, unspecified: Secondary | ICD-10-CM | POA: Diagnosis present

## 2020-10-13 DIAGNOSIS — Z888 Allergy status to other drugs, medicaments and biological substances status: Secondary | ICD-10-CM

## 2020-10-13 DIAGNOSIS — Z79899 Other long term (current) drug therapy: Secondary | ICD-10-CM

## 2020-10-13 DIAGNOSIS — Z7951 Long term (current) use of inhaled steroids: Secondary | ICD-10-CM

## 2020-10-13 DIAGNOSIS — Z794 Long term (current) use of insulin: Secondary | ICD-10-CM

## 2020-10-13 DIAGNOSIS — Z720 Tobacco use: Secondary | ICD-10-CM | POA: Diagnosis present

## 2020-10-13 DIAGNOSIS — T380X5A Adverse effect of glucocorticoids and synthetic analogues, initial encounter: Secondary | ICD-10-CM | POA: Diagnosis not present

## 2020-10-13 LAB — I-STAT VENOUS BLOOD GAS, ED
Acid-base deficit: 4 mmol/L — ABNORMAL HIGH (ref 0.0–2.0)
Bicarbonate: 21.9 mmol/L (ref 20.0–28.0)
Calcium, Ion: 1.16 mmol/L (ref 1.15–1.40)
HCT: 36 % — ABNORMAL LOW (ref 39.0–52.0)
Hemoglobin: 12.2 g/dL — ABNORMAL LOW (ref 13.0–17.0)
O2 Saturation: 99 %
Potassium: 5.1 mmol/L (ref 3.5–5.1)
Sodium: 131 mmol/L — ABNORMAL LOW (ref 135–145)
TCO2: 23 mmol/L (ref 22–32)
pCO2, Ven: 42.3 mmHg — ABNORMAL LOW (ref 44.0–60.0)
pH, Ven: 7.323 (ref 7.250–7.430)
pO2, Ven: 170 mmHg — ABNORMAL HIGH (ref 32.0–45.0)

## 2020-10-13 LAB — COMPREHENSIVE METABOLIC PANEL
ALT: 15 U/L (ref 0–44)
AST: 17 U/L (ref 15–41)
Albumin: 3.4 g/dL — ABNORMAL LOW (ref 3.5–5.0)
Alkaline Phosphatase: 60 U/L (ref 38–126)
Anion gap: 12 (ref 5–15)
BUN: 15 mg/dL (ref 8–23)
CO2: 19 mmol/L — ABNORMAL LOW (ref 22–32)
Calcium: 9.1 mg/dL (ref 8.9–10.3)
Chloride: 99 mmol/L (ref 98–111)
Creatinine, Ser: 1.05 mg/dL (ref 0.61–1.24)
GFR, Estimated: >60 mL/min (ref >60)
Glucose, Bld: 95 mg/dL (ref 70–99)
Potassium: 5.4 mmol/L — ABNORMAL HIGH (ref 3.5–5.1)
Sodium: 130 mmol/L — ABNORMAL LOW (ref 135–145)
Total Bilirubin: 0.7 mg/dL (ref 0.3–1.2)
Total Protein: 6.6 g/dL (ref 6.5–8.1)

## 2020-10-13 LAB — CBC WITH DIFFERENTIAL/PLATELET
Abs Immature Granulocytes: 0.03 10*3/uL (ref 0.00–0.07)
Basophils Absolute: 0 10*3/uL (ref 0.0–0.1)
Basophils Relative: 0 %
Eosinophils Absolute: 0 10*3/uL (ref 0.0–0.5)
Eosinophils Relative: 0 %
HCT: 34.4 % — ABNORMAL LOW (ref 39.0–52.0)
Hemoglobin: 10.2 g/dL — ABNORMAL LOW (ref 13.0–17.0)
Immature Granulocytes: 0 %
Lymphocytes Relative: 15 %
Lymphs Abs: 1.2 10*3/uL (ref 0.7–4.0)
MCH: 22.2 pg — ABNORMAL LOW (ref 26.0–34.0)
MCHC: 29.7 g/dL — ABNORMAL LOW (ref 30.0–36.0)
MCV: 74.9 fL — ABNORMAL LOW (ref 80.0–100.0)
Monocytes Absolute: 0.4 10*3/uL (ref 0.1–1.0)
Monocytes Relative: 5 %
Neutro Abs: 6.4 10*3/uL (ref 1.7–7.7)
Neutrophils Relative %: 80 %
Platelets: 329 10*3/uL (ref 150–400)
RBC: 4.59 MIL/uL (ref 4.22–5.81)
RDW: 22.1 % — ABNORMAL HIGH (ref 11.5–15.5)
WBC: 8.1 10*3/uL (ref 4.0–10.5)
nRBC: 0.2 % (ref 0.0–0.2)

## 2020-10-13 LAB — URINALYSIS, ROUTINE W REFLEX MICROSCOPIC
Bilirubin Urine: NEGATIVE
Glucose, UA: NEGATIVE mg/dL
Hgb urine dipstick: NEGATIVE
Ketones, ur: 5 mg/dL — AB
Leukocytes,Ua: NEGATIVE
Nitrite: NEGATIVE
Protein, ur: NEGATIVE mg/dL
Specific Gravity, Urine: 1.009 (ref 1.005–1.030)
pH: 6 (ref 5.0–8.0)

## 2020-10-13 LAB — GLUCOSE, CAPILLARY: Glucose-Capillary: 304 mg/dL — ABNORMAL HIGH (ref 70–99)

## 2020-10-13 LAB — CBG MONITORING, ED
Glucose-Capillary: 53 mg/dL — ABNORMAL LOW (ref 70–99)
Glucose-Capillary: 68 mg/dL — ABNORMAL LOW (ref 70–99)
Glucose-Capillary: 144 mg/dL — ABNORMAL HIGH (ref 70–99)

## 2020-10-13 LAB — TROPONIN I (HIGH SENSITIVITY): Troponin I (High Sensitivity): 7 ng/L (ref <18)

## 2020-10-13 LAB — BRAIN NATRIURETIC PEPTIDE: B Natriuretic Peptide: 178.9 pg/mL — ABNORMAL HIGH (ref 0.0–100.0)

## 2020-10-13 LAB — RESPIRATORY PANEL BY RT PCR (FLU A&B, COVID)
Influenza A by PCR: NEGATIVE
Influenza B by PCR: NEGATIVE
SARS Coronavirus 2 by RT PCR: NEGATIVE

## 2020-10-13 MED ORDER — ENSURE ENLIVE PO LIQD
237.0000 mL | Freq: Two times a day (BID) | ORAL | Status: DC
  Administered 2020-10-14 – 2020-10-20 (×10): 237 mL via ORAL

## 2020-10-13 MED ORDER — INFLUENZA VAC A&B SA ADJ QUAD 0.5 ML IM PRSY
0.5000 mL | PREFILLED_SYRINGE | INTRAMUSCULAR | Status: AC
  Administered 2020-10-17: 0.5 mL via INTRAMUSCULAR
  Filled 2020-10-13 (×2): qty 0.5

## 2020-10-13 MED ORDER — ENOXAPARIN SODIUM 40 MG/0.4ML ~~LOC~~ SOLN
40.0000 mg | SUBCUTANEOUS | Status: DC
  Administered 2020-10-13: 40 mg via SUBCUTANEOUS
  Filled 2020-10-13: qty 0.4

## 2020-10-13 MED ORDER — ALBUTEROL SULFATE HFA 108 (90 BASE) MCG/ACT IN AERS
2.0000 | INHALATION_SPRAY | Freq: Once | RESPIRATORY_TRACT | Status: AC
  Administered 2020-10-13: 2 via RESPIRATORY_TRACT
  Filled 2020-10-13: qty 6.7

## 2020-10-13 MED ORDER — SODIUM ZIRCONIUM CYCLOSILICATE 10 G PO PACK
10.0000 g | PACK | Freq: Once | ORAL | Status: DC
  Filled 2020-10-13: qty 1

## 2020-10-13 MED ORDER — METHYLPREDNISOLONE SODIUM SUCC 125 MG IJ SOLR
125.0000 mg | Freq: Once | INTRAMUSCULAR | Status: AC
  Administered 2020-10-13: 125 mg via INTRAVENOUS
  Filled 2020-10-13: qty 2

## 2020-10-13 MED ORDER — FUROSEMIDE 10 MG/ML IJ SOLN
40.0000 mg | Freq: Once | INTRAMUSCULAR | Status: AC
  Administered 2020-10-13: 40 mg via INTRAVENOUS
  Filled 2020-10-13: qty 4

## 2020-10-13 MED ORDER — ACETAMINOPHEN 160 MG/5ML PO SOLN: 650.0000 mg | ORAL | Status: DC | PRN

## 2020-10-13 MED ORDER — FUROSEMIDE 10 MG/ML IJ SOLN: 40.0000 mg | Freq: Once | INTRAMUSCULAR | Status: DC

## 2020-10-13 MED ORDER — IPRATROPIUM BROMIDE HFA 17 MCG/ACT IN AERS
2.0000 | INHALATION_SPRAY | Freq: Once | RESPIRATORY_TRACT | Status: AC
  Administered 2020-10-13: 2 via RESPIRATORY_TRACT
  Filled 2020-10-13: qty 12.9

## 2020-10-13 MED ORDER — PNEUMOCOCCAL VAC POLYVALENT 25 MCG/0.5ML IJ INJ
0.5000 mL | INJECTION | INTRAMUSCULAR | Status: AC
  Administered 2020-10-14: 0.5 mL via INTRAMUSCULAR
  Filled 2020-10-13: qty 0.5

## 2020-10-13 MED ORDER — ASPIRIN 325 MG PO TABS
325.0000 mg | ORAL_TABLET | Freq: Every day | ORAL | Status: DC
  Administered 2020-10-14: 325 mg via ORAL
  Filled 2020-10-13: qty 1

## 2020-10-13 MED ORDER — AZITHROMYCIN 500 MG PO TABS
500.0000 mg | ORAL_TABLET | Freq: Every day | ORAL | Status: DC
  Filled 2020-10-13: qty 1

## 2020-10-13 MED ORDER — ASPIRIN 300 MG RE SUPP: 300.0000 mg | Freq: Every day | RECTAL | Status: DC

## 2020-10-13 MED ORDER — ASPIRIN 81 MG PO CHEW
324.0000 mg | CHEWABLE_TABLET | Freq: Once | ORAL | Status: AC
  Administered 2020-10-13: 324 mg via ORAL
  Filled 2020-10-13: qty 4

## 2020-10-13 MED ORDER — SODIUM CHLORIDE 0.9 % IV SOLN
1.0000 g | Freq: Once | INTRAVENOUS | Status: AC
  Administered 2020-10-13: 1 g via INTRAVENOUS
  Filled 2020-10-13: qty 10

## 2020-10-13 MED ORDER — ALBUTEROL SULFATE HFA 108 (90 BASE) MCG/ACT IN AERS
4.0000 | INHALATION_SPRAY | Freq: Once | RESPIRATORY_TRACT | Status: AC
  Administered 2020-10-13: 4 via RESPIRATORY_TRACT
  Filled 2020-10-13: qty 6.7

## 2020-10-13 MED ORDER — INSULIN ASPART 100 UNIT/ML ~~LOC~~ SOLN
0.0000 [IU] | Freq: Three times a day (TID) | SUBCUTANEOUS | Status: DC
  Administered 2020-10-14 (×2): 3 [IU] via SUBCUTANEOUS
  Administered 2020-10-14 – 2020-10-15 (×3): 5 [IU] via SUBCUTANEOUS
  Administered 2020-10-15 – 2020-10-16 (×2): 2 [IU] via SUBCUTANEOUS
  Administered 2020-10-16 – 2020-10-17 (×2): 3 [IU] via SUBCUTANEOUS
  Administered 2020-10-17 (×2): 2 [IU] via SUBCUTANEOUS
  Administered 2020-10-18 – 2020-10-20 (×5): 3 [IU] via SUBCUTANEOUS
  Administered 2020-10-20: 5 [IU] via SUBCUTANEOUS
  Administered 2020-10-21: 3 [IU] via SUBCUTANEOUS
  Administered 2020-10-21: 5 [IU] via SUBCUTANEOUS
  Administered 2020-10-21: 2 [IU] via SUBCUTANEOUS

## 2020-10-13 MED ORDER — INSULIN ASPART 100 UNIT/ML ~~LOC~~ SOLN
0.0000 [IU] | Freq: Every day | SUBCUTANEOUS | Status: DC
  Administered 2020-10-13: 4 [IU] via SUBCUTANEOUS
  Administered 2020-10-15 – 2020-10-20 (×3): 2 [IU] via SUBCUTANEOUS

## 2020-10-13 MED ORDER — IOHEXOL 350 MG/ML SOLN
50.0000 mL | Freq: Once | INTRAVENOUS | Status: AC | PRN
  Administered 2020-10-13: 50 mL via INTRAVENOUS

## 2020-10-13 MED ORDER — DEXTROSE 50 % IV SOLN: 1.0000 | Freq: Once | INTRAVENOUS | Status: DC

## 2020-10-13 MED ORDER — STROKE: EARLY STAGES OF RECOVERY BOOK
Freq: Once | Status: DC
  Filled 2020-10-13: qty 1

## 2020-10-13 MED ORDER — ACETAMINOPHEN 325 MG PO TABS: 650.0000 mg | ORAL_TABLET | ORAL | Status: DC | PRN

## 2020-10-13 MED ORDER — ACETAMINOPHEN 650 MG RE SUPP: 650.0000 mg | RECTAL | Status: DC | PRN

## 2020-10-13 MED ORDER — IPRATROPIUM-ALBUTEROL 0.5-2.5 (3) MG/3ML IN SOLN
3.0000 mL | Freq: Once | RESPIRATORY_TRACT | Status: AC
  Administered 2020-10-13: 3 mL via RESPIRATORY_TRACT
  Filled 2020-10-13: qty 3

## 2020-10-13 MED ORDER — SODIUM CHLORIDE 0.9 % IV SOLN
500.0000 mg | Freq: Once | INTRAVENOUS | Status: AC
  Administered 2020-10-13: 500 mg via INTRAVENOUS
  Filled 2020-10-13: qty 500

## 2020-10-13 NOTE — H&P (Signed)
History and Physical    Brandon [name] ZMO:[id number] DOB: [birth year] DOA: 10/13/2020  PCP: Pcp, No  Patient coming from: Home I have personally briefly reviewed patient's old medical records in [place]  Chief Complaint: Left leg weakness and shortness of breath  HPI: Brandon [name] is a 74 y.o. male with medical history significant of chronic hypoxemic respiratory failure secondary to underlying COPD-on 3 L of oxygen at home, hypertension, hyperlipidemia, GERD, tobacco abuse, coronary artery disease, type 2 diabetes mellitus, PVD presents to emergency department with left leg weakness and shortness of breath since yesterday.  Patient tells me that yesterday he noticed left leg weakness and worsening shortness of breath.  EMS was called and was noted that his oxygen saturation was 86% on 3 L by nasal cannula.  They placed patient on nonrebreather with 10 L supplemental oxygen and his sats improved to 99%.  He reports wheezing, leg swelling, shortness of breath even at rest, orthopnea and unable to stand up due to left leg weakness.  Denies headache, blurry vision, slurred speech, facial droop, head trauma, loss of consciousness, chest pain, fever, chills, worsening cough, palpitation, nausea, vomiting, abdominal pain, urinary symptoms.  His last bowel movement was 5 days ago.  He continues to smoke 1 pack of cigarettes per day, denies alcohol, illicit drug use.  He lives alone at home.  He is not vaccinated against COVID-19.  ED Course: Upon arrival to ED: Patient tachypneic, hypoxic requiring 4 L of oxygen via nasal cannula, afebrile with no leukocytosis, BMP shows sodium of 130, potassium of 5.4, CO2 of 19, CBC shows microcytic anemia, troponin: Negative, COVID-19 negative, BNP: 178, UA negative for infection, patient was found to be hypoglycemic upon arrival with blood glucose of 53.  Received dextrose 50 and his blood sugar improved to 68 and then 144.  Patient received DuoNeb's,  albuterol breathing treatment, Solu-Medrol loading dose, Rocephin and azithromycin in ED.  CT head was obtained which was concerning for ischemic stroke.  Neurology consulted.  [facility] hospital[facility] consulted for admission for full stroke work-up and COPD exacerbation.  Review of Systems: As per HPI otherwise negative.    Past Medical History:  Diagnosis Date  . Abdominal aortic aneurysm without rupture ([place])   . CAD (coronary artery disease)    severe on chest CT   . Chronic alcoholism ([place])   . Chronic idiopathic constipation   . Chronic pancreatitis ([place])   . Chronic respiratory failure with hypoxia ([place])   . Essential hypertension   . GERD (gastroesophageal reflux disease)   . Hyperaldosteronism ([place])   . Malignant neoplasm of prostate ([place])   . Nicotine dependence   . Paroxysmal atrial tachycardia ([place])   . Peripheral vascular disease ([place])   . Serrated polyp of colon   . Type 2 diabetes mellitus ([place])     Past Surgical History:  Procedure Laterality Date  . PROSTATECTOMY  12/2012     reports that he has been smoking cigarettes. He has been smoking about 0.50 packs per day. He does not have any smokeless tobacco history on file. He reports previous alcohol use. No history on file for drug use.  Allergies  Allergen Reactions  . Furosemide Injection [Furosemide]   . Hctz [Hydrochlorothiazide]     Family History  Problem Relation Age of Onset  . Hypertension Mother   . Hypertension Father     Prior to Admission medications   Medication Sig Start Date End Date Taking? Authorizing Provider  albuterol (VENTOLIN HFA)  108 (90 Base) MCG/ACT inhaler INHALE 2 PUFFS BY MOUTH FOUR TIMES A DAY AS NEEDED 11/24/19   [provider]  amLODipine (NORVASC) 10 MG tablet Take by mouth.    [provider]  aspirin 81 MG EC tablet Take 81 mg by mouth daily. 09/18/19   [provider]  atorvastatin (LIPITOR) 40 MG tablet TAKE ONE TABLET BY MOUTH AT BEDTIME ** NEW  CHOLESTEROL MEDICATION 06/11/19   [provider]  budesonide-formoterol (SYMBICORT) 160-4.5 MCG/ACT inhaler INHALE 2 PUFFS BY MOUTH TWICE A DAY (RINSE MOUTH WELL WITH WATER AFTER EACH USE) 11/24/19   [provider]  cilostazol (PLETAL) 100 MG tablet Take by mouth.    [provider]  enalapril (VASOTEC) 20 MG tablet Take by mouth.    [provider]  insulin aspart protamine - aspart (NOVOLOG 70/30 MIX) (70-30) 100 UNIT/ML FlexPen INJECT 45 UNITS SUBCUTANEOUSLY TWICE A DAY FOR DIABETES 06/11/19   [provider]  KETOROLAC TROMETHAMINE, PF, OP Apply 1 drop to eye in the morning, at noon, in the evening, and at bedtime.    [provider]  metFORMIN (GLUCOPHAGE) 1000 MG tablet Take by mouth.    [provider]  metoprolol succinate (TOPROL-XL) 100 MG 24 hr tablet Take 1 and 1/2 (half)  tablet by mouth 2 times a day.    [provider]  nortriptyline (PAMELOR) 50 MG capsule Take by mouth.    [provider]  Omega-3 1000 MG CAPS Take by mouth.    [provider]  omeprazole (PRILOSEC) 20 MG capsule Take by mouth.    [provider]  oxybutynin (DITROPAN-XL) 10 MG 24 hr tablet Take 10 mg by mouth daily.    [provider]  spironolactone (ALDACTONE) 25 MG tablet TAKE ONE TABLET BY MOUTH TWICE A DAY BEFORE MEALS 04/09/19   [provider]  Tiotropium Bromide Monohydrate 2.5 MCG/ACT AERS Inhale 2 puffs into the lungs daily.    [provider]    Physical Exam: Vitals:   10/13/20 1146 10/13/20 1207 10/13/20 1300  BP:  121/67 106/60  Pulse:  86 80  Resp:  (!) 22 (!) 23  Temp:  98.7 F (37.1 C)   TempSrc:  Oral   SpO2: (!) 86% 95% 98%    Constitutional: In mild respiratory distress, on 4 L of oxygen via nasal cannula, Eyes: PERRL, lids and conjunctivae normal ENMT: Mucous membranes are moist. Posterior pharynx clear of any exudate or lesions.Normal dentition.  Neck:  normal, supple, no masses, no thyromegaly Respiratory: Tachypneic, decreased breath sounds noted on the bases.  No wheezing, rhonchi or crackles.  Patient use accessory muscles. Cardiovascular: Regular rate and rhythm, no murmurs / rubs / gallops.  Bilateral 2+ pitting edema positive. 2+ pedal pulses. No carotid bruits.  Abdomen: Soft, distended, no tenderness, no masses palpated. No hepatosplenomegaly. Bowel sounds positive.  Musculoskeletal: no clubbing / cyanosis. No joint deformity upper and lower extremities. Good ROM, no contractures. Normal muscle tone.  Skin: no rashes, lesions, ulcers. No induration Neurologic: CN 2-12 grossly intact. Sensation intact, DTR normal.  However 4 out of 5 in all 3 extremities except 3 out of 5 in left lower extremity.  Psychiatric: Normal judgment and insight. Alert and oriented x 3. Normal mood.    Labs on Admission: I have personally reviewed following labs and imaging studies  CBC: Recent Labs  Lab 10/13/20 1215 10/13/20 1332  WBC 8.1  --   NEUTROABS 6.4  --  HGB 10.2* 12.2*  HCT 34.4* 36.0*  MCV 74.9*  --   PLT 329  --    Basic Metabolic Panel: Recent Labs  Lab 10/13/20 1215 10/13/20 1332  NA 130* 131*  K 5.4* 5.1  CL 99  --   CO2 19*  --   GLUCOSE 95  --   BUN 15  --   CREATININE 1.05  --   CALCIUM 9.1  --    GFR: CrCl cannot be calculated (Unknown ideal weight.). Liver Function Tests: Recent Labs  Lab 10/13/20 1215  AST 17  ALT 15  ALKPHOS 60  BILITOT 0.7  PROT 6.6  ALBUMIN 3.4*   No results for input(s): LIPASE, AMYLASE in the last 168 hours. No results for input(s): AMMONIA in the last 168 hours. Coagulation Profile: No results for input(s): INR, PROTIME in the last 168 hours. Cardiac Enzymes: No results for input(s): CKTOTAL, CKMB, CKMBINDEX, TROPONINI in the last 168 hours. BNP (last 3 results) No results for input(s): PROBNP in the last 8760 hours. HbA1C: No results for input(s): HGBA1C in the last 72  hours. CBG: Recent Labs  Lab 10/13/20 1152 10/13/20 1216 10/13/20 1532  GLUCAP 53* 68* 144*   Lipid Profile: No results for input(s): CHOL, HDL, LDLCALC, TRIG, CHOLHDL, LDLDIRECT in the last 72 hours. Thyroid Function Tests: No results for input(s): TSH, T4TOTAL, FREET4, T3FREE, THYROIDAB in the last 72 hours. Anemia Panel: No results for input(s): VITAMINB12, FOLATE, FERRITIN, TIBC, IRON, RETICCTPCT in the last 72 hours. Urine analysis:    Component Value Date/Time   COLORURINE YELLOW 10/13/2020 1228   APPEARANCEUR CLEAR 10/13/2020 1228   LABSPEC 1.009 10/13/2020 1228   PHURINE 6.0 10/13/2020 1228   GLUCOSEU NEGATIVE 10/13/2020 [place] 10/13/2020 [place] 10/13/2020 1228   KETONESUR 5 (A) 10/13/2020 1228   PROTEINUR NEGATIVE 10/13/2020 1228   NITRITE NEGATIVE 10/13/2020 [place] 10/13/2020 1228    Radiological Exams on Admission: CT Head Wo Contrast  Result Date: 10/13/2020 CLINICAL DATA:  Neuro deficit, left arm and leg weakness. EXAM: CT HEAD WITHOUT CONTRAST TECHNIQUE: Contiguous axial images were obtained from the base of the skull through the vertex without intravenous contrast. COMPARISON:  None. FINDINGS: Brain: No intracranial hemorrhage. Small right corona radiata hypodensity. No mass lesion. No midline shift, ventriculomegaly or extra-axial fluid collection. Mild diffuse cerebral atrophy with ex vacuo dilatation. Scattered and confluent periventricular and subcortical white matter hypodensities are nonspecific however commonly associated with chronic microvascular ischemic changes. Vascular: No hyperdense vessel or unexpected calcification. Bilateral skull base atherosclerotic calcifications. Skull: Negative for fracture or focal lesion. Sinuses/Orbits: Normal orbits. [place] chronic of maxillary sinus disease with moderate mucosal thickening and layering secretions. Mild right maxillary sinus mucosal thickening. Right  mastoid effusion with dehiscence of the bony posterior external auditory canal wall. Other: None. IMPRESSION: Small right corona radiata hypodensity may reflect acute/subacute insult versus chronic microvascular ischemic changes. Consider MRI if suspicion persists. Mild cerebral atrophy and chronic microvascular ischemic changes. Right mastoid effusion with dehiscence of the bony external auditory canal. Further evaluation with outpatient CT temporal bone is recommended. Electronically Signed   By: Primitivo Gauze M.D.   On: 10/13/2020 14:11   CT Chest Wo Contrast  Result Date: 10/13/2020 CLINICAL DATA:  Shortness of breath in a patient with a history of COPD. EXAM: CT CHEST WITHOUT CONTRAST TECHNIQUE: Multidetector CT imaging of the chest was performed following the standard protocol without IV contrast. COMPARISON:  Single-view  of the chest 10/13/2020 and 07/21/2008. FINDINGS: Cardiovascular: The patient has extensive calcific aortic and coronary atherosclerosis. The ascending aorta measures 4.8 cm transverse at the level of the pulmonary outflow tract. Heart size is upper normal. No pericardial effusion Mediastinum/Nodes: No enlarged mediastinal or axillary lymph nodes. Thyroid gland, trachea, and esophagus demonstrate no significant findings. Lungs/Pleura: There are small bilateral pleural effusions. The patient has severe central lobular and paraseptal emphysema. There is mild basilar atelectasis bilaterally. Upper Abdomen: No acute abnormality. Three cysts are seen in the liver. The largest is in the right lobe and measures 6.2 cm. Musculoskeletal: No acute or focal abnormality. Multilevel cyst thoracic spondylosis is noted. IMPRESSION: Small bilateral pleural effusions and dependent basilar atelectasis. 4.8 cm ascending thoracic aortic aneurysm. Recommend semi-annual imaging followup by CTA or MRA and referral to cardiothoracic surgery if not already obtained. This recommendation follows 2010  ACCF/AHA/AATS/ACR/ASA/SCA/SCAI/SIR/STS/SVM Guidelines for the Diagnosis and Management of Patients With Thoracic Aortic Disease. Circulation. 2010; 121: A193-X902. Aortic atherosclerosis (ICD10-I70.0) and severe emphysema (ICD10-J43.9). Extensive calcific coronary atherosclerosis also noted. Electronically Signed   By: Inge Rise M.D.   On: 10/13/2020 14:11   DG Chest Port 1 View  Result Date: 10/13/2020 CLINICAL DATA:  Shortness of breath. Per chart review, patient has bilateral lower lobe rales and bilateral lower extremity edema. EXAM: PORTABLE CHEST 1 VIEW COMPARISON:  07/21/2008 FINDINGS: There is diffuse interstitial prominence, suggestive of interstitial pulmonary edema. No confluent consolidation. No visible pleural effusions or pneumothorax on this limited AP portable radiograph. Mild enlargement the cardiac silhouette. Tortuous aorta. Aortic atherosclerosis. IMPRESSION: 1. Diffuse interstitial prominence, suggestive of interstitial pulmonary edema. Atypical infection could have a similar appearance. 2. Mild cardiomegaly. Electronically Signed   By: Margaretha Sheffield MD   On: 10/13/2020 13:17    EKG: Independently reviewed.  Sinus rhythm, atrial premature complexes.  Borderline IV conduction delay.  Assessment/Plan Principal Problem:   Ischemic stroke [facility]) Active Problems:   Essential hypertension   Type 2 diabetes mellitus (HCC)   CAD (coronary artery disease)   GERD (gastroesophageal reflux disease)   Acute on chronic respiratory failure with hypoxemia (HCC)   COPD (chronic obstructive pulmonary disease) (HCC)   Tobacco abuse   Hyponatremia   Hyperkalemia    Ischemic stroke: -Patient presented with left leg weakness started yesterday.  Reviewed CT head. -admit forTelemetry monitoring -Stroke protocol -Allow for permissive hypertension for the first 24-48h - only treat PRN if SBP >409 mmHg or diastolic blood pressure >735. Blood pressures can be gradually normalized to  SBP<140 upon discharge. -Ordered MRI brain without, CTA head and neck, transthoracic echo, lipid panel, A1c -ASA given -Frequent neuro checks -Consult Neurology-appreciate input -PT/OT eval, Speech consult  Acute on chronic hypoxemic respiratory failure secondary to COPD exacerbation versus acute CHF of unknown type: -Patient is tachypneic, requiring 4 L of oxygen via nasal cannula, reviewed chest x-ray and CTA chest which shows small bilateral pleural effusion and dependent basilar atelectasis. Has b/l leg swelling -Received loading dose of Solu-Medrol, DuoNeb and albuterol breathing treatment in ED. -BNP: 178.  Initial troponin negative.  Covid: Negative -Continue Solu-Medrol, azithromycin, start patient on Lasix, check transthoracic echo.  Strict INO's and daily weight. -Monitor electrolytes closely. -Continue home inhalers -Incentive spirometry  Hypertension: Stable -Hold home BP meds-amlodipine, enalapril, metoprolol, Aldactone to allow permissive hypertension.  Monitor blood pressure closely  Hyperkalemia: Could be secondary to Aldactone. -Lokelma p.o. once given.  Check magnesium level.  Repeat BMP tomorrow a.m.  Microcytic anemia: H&H 10.2/34.4, MCV: 74.9. -  Check iron panel.  Monitor H&H closely.  Hyperlipidemia: Check lipid panel -Continue atorvastatin  PVD: Continue aspirin, statin, cilostazol  Type 2 diabetes mellitus: Check A1c -Hold Metformin.  Patient hypoglycemic upon arrival to ED. -Start patient on sliding scale insulin.  Monitor blood sugar closely  Ascending thoracic aortic aneurysm: Noted on CTA chest. -Outpatient follow-up recommended.  GERD: Continue PPI  Tobacco abuse: Counseled about cessation  Please note: Patient is allergic to Lasix (unknown type of reaction).  Patient was on Lasix before and was discontinued due to unknown reason.  Discussed with the pharmacy-we will give 1 dose of IV Lasix and monitor him closely for any reaction.  DVT  prophylaxis: Lovenox/SCD Code Status: Full code Family Communication: Patient's daughter present at bedside.  Plan of care discussed with patient in length and he verbalized understanding and agreed with it. Disposition Plan: Home/SNF in 2 to 3 days Consults called: Neurology Admission status: Inpatient   Mckinley Jewel MD [facility]  If 7PM-7AM, please contact night-coverage [email or web address] Password [facility]  10/13/2020, 3:57 PM

## 2020-10-13 NOTE — ED Notes (Signed)
PA aware of glucose of 68. She does not want pt drinking additional juice at this time. No additional orders received. PT A&Ox4.

## 2020-10-13 NOTE — Consult Note (Signed)
Neurology Consultation  Reason for Consult: Stroke Referring Physician: Lucrezia Starch, MD  CC: Left leg weakness  History is obtained from: Patient  HPI: Brandon [name] is a 74 y.o. male with type 2 diabetes, peripheral vascular disease, paroxysmal atrial tachycardia, essential hypertension, tobacco abuse and chronic alcoholism.  Patient presented to the emergency department via EMS secondary to shortness of breath and left leg weakness.  EMS noted that his O2 saturation was 86% on 3 L via nasal cannula when they arrived.  In addition his blood glucose was only 78.  Patient states that he was his normal self yesterday until approximately 12 noon when he got up to get something to eat and noticed that his left leg was weaker than his right.  He does not feel there is been any change since that time.  He denies any previous stroke and states that he does take aspirin 81 mg daily.  Currently he is very short of breath.  He does admit to smoking currently.  Patient denies any chest pain, blurred vision, headache, difficulty understanding or expressing himself.  LKW: 12 noon on 10/12/2020 tpa given?: no, out of window Premorbid modified Rankin scale (mRS):1  NIHSS 1a Level of Conscious.: 0 1b LOC Questions:0  1c LOC Commands: 0 2 Best Gaze: 0 3 Visual: 0 4 Facial Palsy: 1 5a Motor Arm - left: 1 5b Motor Arm - Right: 0 6a Motor Leg - Left: 2 6b Motor Leg - Right: 0 7 Limb Ataxia: 1 8 Sensory: 0 9 Best Language: 0 10 Dysarthria: 0 11 Extinct. and Inatten.: 0 TOTAL: 3   Past Medical History:  Diagnosis Date  . Abdominal aortic aneurysm without rupture ([place])   . CAD (coronary artery disease)    severe on chest CT   . Chronic alcoholism ([place])   . Chronic idiopathic constipation   . Chronic pancreatitis ([place])   . Chronic respiratory failure with hypoxia ([place])   . Essential hypertension   . GERD (gastroesophageal reflux disease)   . Hyperaldosteronism ([place])   . Malignant  neoplasm of prostate ([place])   . Nicotine dependence   . Paroxysmal atrial tachycardia ([place])   . Peripheral vascular disease ([place])   . Serrated polyp of colon   . Type 2 diabetes mellitus ([facility])     Family History  Problem Relation Age of Onset  . Hypertension Mother   . Hypertension Father    Social History:   reports that he has been smoking cigarettes. He has been smoking about 0.50 packs per day. He does not have any smokeless tobacco history on file. He reports previous alcohol use. No history on file for drug use.  Medications  Current Facility-Administered Medications:  .  aspirin chewable tablet 324 mg, 324 mg, Oral, Once, Domenic Moras, PA-C .  azithromycin (ZITHROMAX) 500 mg in sodium chloride 0.9 % 250 mL IVPB, 500 mg, Intravenous, Once, Sponseller, Rebekah R, PA-C .  dextrose 50 % solution 50 mL, 1 ampule, Intravenous, Once, Domenic Moras, PA-C .  ipratropium-albuterol (DUONEB) 0.5-2.5 (3) MG/3ML nebulizer solution 3 mL, 3 mL, Nebulization, Once, Sponseller, Rebekah R, PA-C  Current Outpatient Medications:  .  albuterol (VENTOLIN HFA) 108 (90 Base) MCG/ACT inhaler, INHALE 2 PUFFS BY MOUTH FOUR TIMES A DAY AS NEEDED, Disp: , Rfl:  .  amLODipine (NORVASC) 10 MG tablet, Take by mouth., Disp: , Rfl:  .  aspirin 81 MG EC tablet, Take 81 mg by mouth daily., Disp: , Rfl:  .  atorvastatin (LIPITOR)  40 MG tablet, TAKE ONE TABLET BY MOUTH AT BEDTIME ** NEW CHOLESTEROL MEDICATION, Disp: , Rfl:  .  budesonide-formoterol (SYMBICORT) 160-4.5 MCG/ACT inhaler, INHALE 2 PUFFS BY MOUTH TWICE A DAY (RINSE MOUTH WELL WITH WATER AFTER EACH USE), Disp: , Rfl:  .  cilostazol (PLETAL) 100 MG tablet, Take by mouth., Disp: , Rfl:  .  enalapril (VASOTEC) 20 MG tablet, Take by mouth., Disp: , Rfl:  .  insulin aspart protamine - aspart (NOVOLOG 70/30 MIX) (70-30) 100 UNIT/ML FlexPen, INJECT 45 UNITS SUBCUTANEOUSLY TWICE A DAY FOR DIABETES, Disp: , Rfl:  .  KETOROLAC TROMETHAMINE, PF, OP, Apply 1 drop to eye  in the morning, at noon, in the evening, and at bedtime., Disp: , Rfl:  .  metFORMIN (GLUCOPHAGE) 1000 MG tablet, Take by mouth., Disp: , Rfl:  .  metoprolol succinate (TOPROL-XL) 100 MG 24 hr tablet, Take 1 and 1/2 (half)  tablet by mouth 2 times a day., Disp: , Rfl:  .  nortriptyline (PAMELOR) 50 MG capsule, Take by mouth., Disp: , Rfl:  .  Omega-3 1000 MG CAPS, Take by mouth., Disp: , Rfl:  .  omeprazole (PRILOSEC) 20 MG capsule, Take by mouth., Disp: , Rfl:  .  oxybutynin (DITROPAN-XL) 10 MG 24 hr tablet, Take 10 mg by mouth daily., Disp: , Rfl:  .  spironolactone (ALDACTONE) 25 MG tablet, TAKE ONE TABLET BY MOUTH TWICE A DAY BEFORE MEALS, Disp: , Rfl:  .  Tiotropium Bromide Monohydrate 2.5 MCG/ACT AERS, Inhale 2 puffs into the lungs daily., Disp: , Rfl:   ROS: .   General ROS: negative for - chills, fatigue, fever, night sweats, weight gain or weight loss Psychological ROS: negative for - behavioral disorder, hallucinations, memory difficulties, mood swings or suicidal ideation Ophthalmic ROS: negative for - blurry vision, double vision, eye pain or loss of vision Respiratory ROS: Positive for - shortness of breath or wheezing Cardiovascular ROS: negative for - chest pain, dyspnea on exertion, edema or irregular heartbeat Gastrointestinal ROS: negative for - abdominal pain, diarrhea, hematemesis, nausea/vomiting or stool incontinence Genito-Urinary ROS: negative for - dysuria, hematuria, incontinence or urinary frequency/urgency Musculoskeletal ROS: Positive for - joint swelling or muscular weakness Neurological ROS: as noted in HPI Dermatological ROS: negative for rash and skin lesion changes  Exam: Current vital signs: BP 106/60   Pulse 80   Temp 98.7 F (37.1 C) (Oral)   Resp (!) 23   SpO2 98%  Vital signs in last 24 hours: Temp:  [98.7 F (37.1 C)] 98.7 F (37.1 C) (10/14 1207) Pulse Rate:  [80-86] 80 (10/14 1300) Resp:  [22-23] 23 (10/14 1300) BP: (106-121)/(60-67)  106/60 (10/14 1300) SpO2:  [86 %-98 %] 98 % (10/14 1300)   Constitutional: Appears well-developed and well-nourished.  Eyes: No scleral injection HENT: No OP obstrucion Head: Normocephalic.  Cardiovascular: Normal rate and regular rhythm.  Respiratory: Increased effort with labored breathing GI: Soft.  No distension. There is no tenderness.  Skin: WDI-with significant lower extremity edema and hemosiderin staining  Neuro: Mental Status: Patient is awake, alert, oriented to person, place, month, year, and situation.  Speech is clear without aphasia or dysarthria.  Naming, repeating, comprehension intact.  Patient is able to follow all commands. Cranial Nerves: II: Visual Fields are full.  III,IV, VI: EOMI without ptosis or diploplia. Pupils equal, round and reactive to light V: Facial sensation is symmetric to temperature VII: Slight left facial droop at rest however he also is adentulous.  VIII: hearing is intact  to voice X: Palat elevates symmetrically XI: Shoulder shrug is symmetric. XII: tongue is midline without atrophy or fasciculations.  Motor: Tone is normal. Bulk is normal.  4/5 strength throughout with the exception of his left leg which he is unable to lift off the bed.  He does have a left arm drift and allows his left leg to fall to the bed slowly.   Sensory: Sensation is symmetric to light touch and temperature in the arms and legs.  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  Deep Tendon Reflexes: 2+ and symmetric in the biceps with no patellar reflex Plantars: Mute bilaterally Cerebellar: Unable to do heel-to-shin however he did show dysmetria of his left finger-nose.  Right finger-nose showed no dysmetria.  Labs I have reviewed labs in epic and the results pertinent to this consultation are:   CBC    Component Value Date/Time   WBC 8.1 10/13/2020 1215   RBC 4.59 10/13/2020 1215   HGB 12.2 (L) 10/13/2020 1332   HCT 36.0 (L)  10/13/2020 1332   PLT 329 10/13/2020 1215   MCV 74.9 (L) 10/13/2020 1215   MCH 22.2 (L) 10/13/2020 1215   MCHC 29.7 (L) 10/13/2020 1215   RDW 22.1 (H) 10/13/2020 1215   LYMPHSABS 1.2 10/13/2020 1215   MONOABS 0.4 10/13/2020 1215   EOSABS 0.0 10/13/2020 1215   BASOSABS 0.0 10/13/2020 1215    CMP     Component Value Date/Time   NA 131 (L) 10/13/2020 1332   K 5.1 10/13/2020 1332   CL 99 10/13/2020 1215   CO2 19 (L) 10/13/2020 1215   GLUCOSE 95 10/13/2020 1215   BUN 15 10/13/2020 1215   CREATININE 1.05 10/13/2020 1215   CALCIUM 9.1 10/13/2020 1215   PROT 6.6 10/13/2020 1215   ALBUMIN 3.4 (L) 10/13/2020 1215   AST 17 10/13/2020 1215   ALT 15 10/13/2020 1215   ALKPHOS 60 10/13/2020 1215   BILITOT 0.7 10/13/2020 1215   GFRNONAA >60 10/13/2020 1215   GFRAA  07/21/2008 1915    >60        The eGFR has been calculated using the MDRD equation. This calculation has not been validated in all clinical    Imaging I have reviewed the images obtained:  CT-scan of the brain-small right corona radiata hypodensity may reflect acute/subacute insult versus chronic microvascular ischemic changes.  Mild cerebral atrophy and chronic microvascular ischemic changes.   Etta Quill PA-C [facility] [phone]  M-F  (9:00 am- 5:00 PM)  10/13/2020, 3:22 PM     Assessment:  This is a 74 year old male presenting to the hospital secondary to shortness of breath and left leg weakness.  CT scan did reveal a small right corona radiata hypodensity.  Exam does reveal drift in both left arm and left leg.  Patient also shows weakness in his left leg.  Along with dysmetria of the left finger-to-nose.  Patient will be admitted for both stroke and also shortness of breath.  Impression: -Right corona radiata stroke -Significant peripheral edema and peripheral vascular disease  Recommend -MRI of the brain without contrast -CTA head neck -Transthoracic Echo  -Plavix 75 mg daily   -Increase atorvastatin to 80 mg daily -BP goal: permissive HTN upto 220/120 mmHg -HBAIC and Lipid profile -Telemetry monitoring -Frequent neuro checks -NPO until passes stroke swallow screen -PT/OT -Primary team to treat patient's respiratory exacerbation and shortness of breath # please page stroke NP  Or  PA  Or MD from  8am -4 pm  as this patient from this time will be  followed by the stroke.   You can look them up on [email or web address]  Password [email or web address]

## 2020-10-13 NOTE — Progress Notes (Signed)
Secure chatted with Dr. Doristine Bosworth because patient has Lasix ordered despite having it listed as an allergy. Patient said he had a reaction to this medication back in 1984, most likely an intolerance. MD said she discussed with pharmacy and decided it was okay to give.

## 2020-10-13 NOTE — ED Triage Notes (Signed)
Pt presents to ED from home via EMS with complaints of Left leg and left arm weakness that began yesterday around 1200 pm. EMS arrived today and spo2 was 86% on 3lpm [place] and BG 54. Pt placed on 10lpm nrb and spo2 increased to 99%. Pt was encouraged to eat peanut butter and BG increased to 78.   EMS reports bilateral lower lobe rales, bilateral lower extremity edema.  On arrival to ED pt states he is "always short of breath, but no worse than normal"  Pt a&ox4.

## 2020-10-13 NOTE — Therapy (Signed)
Called to assess pt for desaturation into the mid to low 80's. On arrival pt was on 5L/[place] with a saturation of 84%. Pt placed on non-heated HFNC at 8L saturation is now 94%. Will continue to monitor and wean as tolerated.

## 2020-10-13 NOTE — ED Provider Notes (Signed)
[facility] EMERGENCY DEPARTMENT Provider Note   CSN: [id number] Arrival date & time: 10/13/20  1142     History Chief Complaint  Patient presents with  . Weakness  . Shortness of Breath    Brandon [name] is a 74 y.o. male who presents today via EMS for shortness of breath and concern for left arm left leg weakness.  Per EMS patient's oxygen saturation was 86% on 3 L by nasal cannula at home when they arrived. (Patient is chronically on 3 L by nasal cannula since January 2021 for emphysema).  They placed the patient on nonrebreather with 10 L supplemental oxygen, his sats improved to 99%.  Additionally when they arrived the patient's blood glucose was 78, he was given peanut butter.  Patient endorses shortness of breath, but states "it is no worse than normal".  Additionally he was abdomen is quite distended, which he states is normal since he had prostate removed.  He is concerned regarding swelling in his feet, states that it never been swollen like this before.  He endorses weakness of his left arm and left leg that "somewhere around noon yesterday", denies numbness or tingling.  Denies visual disturbance such as blurry vision or double vision, denies dizziness, nausea, vomiting, diarrhea. He denies fever, chills at home. Endorses constipation x1 week, however states he has not had an appetite for the last week and has hardly been eating anything.  He denies slurred speech, feeling confused or disoriented.  He is daughter Brandon [name] is at the bedside; she states she lives in [place].  She says that he is a "secretive person" so she is never quite sure how he is feeling, but that he is "a very sick man".  She states that he is abdomen is more distended than it has been in the past, and that his breathing is more difficult than it has been for him prior to today.  I personally reviewed this patient's medical record.  It is significant for COPD, prostate cancer with  prostatectomy, hypertension, GERD,  atrial tachycardia, PVD, type 2 diabetes, CAD.   Patient has not been vaccinated against COVID-19.  States he has had multiple appointments scehduled, however he never had a ride, so he was unable to receive it.   Patient is an every day smoker, 1/2 pack.  HPI     Past Medical History:  Diagnosis Date  . Abdominal aortic aneurysm without rupture ([place])   . CAD (coronary artery disease)    severe on chest CT   . Chronic alcoholism ([place])   . Chronic idiopathic constipation   . Chronic pancreatitis ([place])   . Chronic respiratory failure with hypoxia ([place])   . Essential hypertension   . GERD (gastroesophageal reflux disease)   . Hyperaldosteronism ([place])   . Malignant neoplasm of prostate ([place])   . Nicotine dependence   . Paroxysmal atrial tachycardia ([place])   . Peripheral vascular disease ([place])   . Serrated polyp of colon   . Type 2 diabetes mellitus [facility])     Patient Active Problem List   Diagnosis Date Noted  . Essential hypertension   . Type 2 diabetes mellitus ([place])   . CAD (coronary artery disease)   . GERD (gastroesophageal reflux disease)   . Acute on chronic respiratory failure with hypoxemia ([place])   . COPD (chronic obstructive pulmonary disease) ([place])   . Tobacco abuse   . Hyponatremia   . Hyperkalemia   . Ischemic stroke ([place])  Past Surgical History:  Procedure Laterality Date  . PROSTATECTOMY  12/2012       Family History  Problem Relation Age of Onset  . Hypertension Mother   . Hypertension Father     Social History   Tobacco Use  . Smoking status: Current Every Day Smoker    Packs/day: 0.50    Types: Cigarettes  Substance Use Topics  . Alcohol use: Not Currently  . Drug use: Not on file    Home Medications Prior to Admission medications   Medication Sig Start Date End Date Taking? Authorizing Provider  albuterol (VENTOLIN HFA) 108 (90 Base) MCG/ACT inhaler INHALE 2 PUFFS BY MOUTH FOUR TIMES A DAY AS  NEEDED 11/24/19   [provider]  amLODipine (NORVASC) 10 MG tablet Take by mouth.    [provider]  aspirin 81 MG EC tablet Take 81 mg by mouth daily. 09/18/19   [provider]  atorvastatin (LIPITOR) 40 MG tablet TAKE ONE TABLET BY MOUTH AT BEDTIME ** NEW CHOLESTEROL MEDICATION 06/11/19   [provider]  budesonide-formoterol (SYMBICORT) 160-4.5 MCG/ACT inhaler INHALE 2 PUFFS BY MOUTH TWICE A DAY (RINSE MOUTH WELL WITH WATER AFTER EACH USE) 11/24/19   [provider]  cilostazol (PLETAL) 100 MG tablet Take by mouth.    [provider]  enalapril (VASOTEC) 20 MG tablet Take by mouth.    [provider]  insulin aspart protamine - aspart (NOVOLOG 70/30 MIX) (70-30) 100 UNIT/ML FlexPen INJECT 45 UNITS SUBCUTANEOUSLY TWICE A DAY FOR DIABETES 06/11/19   [provider]  KETOROLAC TROMETHAMINE, PF, OP Apply 1 drop to eye in the morning, at noon, in the evening, and at bedtime.    [provider]  metFORMIN (GLUCOPHAGE) 1000 MG tablet Take by mouth.    [provider]  metoprolol succinate (TOPROL-XL) 100 MG 24 hr tablet Take 1 and 1/2 (half)  tablet by mouth 2 times a day.    [provider]  nortriptyline (PAMELOR) 50 MG capsule Take by mouth.    [provider]  Omega-3 1000 MG CAPS Take by mouth.    [provider]  omeprazole (PRILOSEC) 20 MG capsule Take by mouth.    [provider]  oxybutynin (DITROPAN-XL) 10 MG 24 hr tablet Take 10 mg by mouth daily.    [provider]  spironolactone (ALDACTONE) 25 MG tablet TAKE ONE TABLET BY MOUTH TWICE A DAY BEFORE MEALS 04/09/19   [provider]  Tiotropium Bromide Monohydrate 2.5 MCG/ACT AERS Inhale 2 puffs into the lungs daily.    [provider]    Allergies    Furosemide injection [furosemide] and Hctz [hydrochlorothiazide]  Review of Systems   Review of Systems  Constitutional: Positive for  activity change, appetite change and fatigue. Negative for chills, diaphoresis and fever.  HENT: Negative.  Negative for trouble swallowing and voice change.   Eyes: Negative for photophobia, pain and visual disturbance.  Respiratory: Positive for chest tightness and shortness of breath. Negative for cough.   Cardiovascular: Positive for leg swelling. Negative for chest pain and palpitations.  Gastrointestinal: Positive for abdominal distention. Negative for abdominal pain, nausea and vomiting.  Endocrine: Positive for cold intolerance.  Genitourinary: Negative for dysuria, frequency, hematuria and urgency.  Musculoskeletal: Negative.   Skin: Positive for rash.  Neurological: Positive for weakness. Negative for dizziness, syncope, facial asymmetry, speech difficulty, light-headedness, numbness and headaches.  Hematological: Negative for adenopathy.  Psychiatric/Behavioral: Negative.     Physical Exam Updated  Vital Signs BP 106/60   Pulse 80   Temp 98.7 F (37.1 C) (Oral)   Resp (!) 23   SpO2 98%   Physical Exam Vitals and nursing note reviewed.  Constitutional:      Appearance: He is ill-appearing.  HENT:     Head: Normocephalic and atraumatic.     Mouth/Throat:     Mouth: Mucous membranes are moist.     Pharynx: Uvula midline. No oropharyngeal exudate or posterior oropharyngeal erythema.  Eyes:     General:        Right eye: No discharge.        Left eye: No discharge.     Extraocular Movements: Extraocular movements intact.     Conjunctiva/sclera: Conjunctivae normal.     Pupils: Pupils are equal, round, and reactive to light.     Visual Fields: Right eye visual fields normal and left eye visual fields normal.  Neck:     Trachea: No tracheal deviation.  Cardiovascular:     Rate and Rhythm: Normal rate and regular rhythm.     Pulses:          Radial pulses are 2+ on the right side and 2+ on the left side.       Dorsalis pedis pulses are detected w/ Doppler on the right  side and detected w/ Doppler on the left side.     Heart sounds: Murmur heard.   Pulmonary:     Effort: Tachypnea and prolonged expiration present.     Breath sounds: Examination of the right-lower field reveals rales. Examination of the left-lower field reveals rales. Wheezing and rales present.  Chest:     Chest wall: No mass, tenderness or edema.  Abdominal:     General: Bowel sounds are normal. There is distension.     Palpations: Abdomen is soft. There is fluid wave.     Tenderness: There is no abdominal tenderness. There is no guarding or rebound.  Musculoskeletal:        General: No deformity.     Cervical back: Neck supple.     Right lower leg: No tenderness. 3+ Pitting Edema present.     Left lower leg: No tenderness. 3+ Pitting Edema present.     Comments: 5/ 5 grip strength bilaterally in upper extremities. 5/5 strength in right lower extremity 4/5 strength left lower extremity  Lymphadenopathy:     Cervical: No cervical adenopathy.  Skin:    General: Skin is warm and dry.     Capillary Refill: Capillary refill takes 2 to 3 seconds.     Coloration: Skin is pale.  Neurological:     Mental Status: He is alert and oriented to person, place, and time. Mental status is at baseline.     Cranial Nerves: Cranial nerves are intact. No cranial nerve deficit.     Sensory: Sensation is intact.     Motor: Weakness present.     Coordination: Finger-Nose-Finger Test abnormal and Heel to [employer] abnormal.     Comments: Delay in coordination of finger-nose test in left upper extremity; intact in RUE  Patient unable to perform heel-to-shin test left lower extremity; able to perform and intact with right lower extremity  Psychiatric:        Mood and Affect: Mood normal.        Speech: Speech normal.        Cognition and Memory: Cognition normal.     ED Results / Procedures / Treatments   Labs (  all labs ordered are listed, but only abnormal results are displayed) Labs Reviewed   COMPREHENSIVE METABOLIC PANEL - Abnormal; Notable for the following components:      Result Value   Sodium 130 (*)    Potassium 5.4 (*)    CO2 19 (*)    Albumin 3.4 (*)    All other components within normal limits  CBC WITH DIFFERENTIAL/PLATELET - Abnormal; Notable for the following components:   Hemoglobin 10.2 (*)    HCT 34.4 (*)    MCV 74.9 (*)    MCH 22.2 (*)    MCHC 29.7 (*)    RDW 22.1 (*)    All other components within normal limits  BRAIN NATRIURETIC PEPTIDE - Abnormal; Notable for the following components:   B Natriuretic Peptide 178.9 (*)    All other components within normal limits  URINALYSIS, ROUTINE W REFLEX MICROSCOPIC - Abnormal; Notable for the following components:   Ketones, ur 5 (*)    All other components within normal limits  CBG MONITORING, ED - Abnormal; Notable for the following components:   Glucose-Capillary 53 (*)    All other components within normal limits  CBG MONITORING, ED - Abnormal; Notable for the following components:   Glucose-Capillary 68 (*)    All other components within normal limits  I-STAT VENOUS BLOOD GAS, ED - Abnormal; Notable for the following components:   pCO2, Ven 42.3 (*)    pO2, Ven 170.0 (*)    Acid-base deficit 4.0 (*)    Sodium 131 (*)    HCT 36.0 (*)    Hemoglobin 12.2 (*)    All other components within normal limits  CBG MONITORING, ED - Abnormal; Notable for the following components:   Glucose-Capillary 144 (*)    All other components within normal limits  RESPIRATORY PANEL BY RT PCR (FLU A&B, COVID)  BLOOD GAS, VENOUS  TROPONIN I (HIGH SENSITIVITY)  TROPONIN I (HIGH SENSITIVITY)    EKG EKG Interpretation  Date/Time:  Thursday October 13 2020 11:50:25 EDT Ventricular Rate:  88 PR Interval:    QRS Duration: 113 QT Interval:  376 QTC Calculation: 455 R Axis:   74 Text Interpretation: Sinus rhythm Atrial premature complex Borderline intraventricular conduction delay Confirmed by Madalyn Rob [phone]) on  10/13/2020 12:26:56 PM   Radiology CT Head Wo Contrast  Result Date: 10/13/2020 CLINICAL DATA:  Neuro deficit, left arm and leg weakness. EXAM: CT HEAD WITHOUT CONTRAST TECHNIQUE: Contiguous axial images were obtained from the base of the skull through the vertex without intravenous contrast. COMPARISON:  None. FINDINGS: Brain: No intracranial hemorrhage. Small right corona radiata hypodensity. No mass lesion. No midline shift, ventriculomegaly or extra-axial fluid collection. Mild diffuse cerebral atrophy with ex vacuo dilatation. Scattered and confluent periventricular and subcortical white matter hypodensities are nonspecific however commonly associated with chronic microvascular ischemic changes. Vascular: No hyperdense vessel or unexpected calcification. Bilateral skull base atherosclerotic calcifications. Skull: Negative for fracture or focal lesion. Sinuses/Orbits: Normal orbits. [place]ela chronic of maxillary sinus disease with moderate mucosal thickening and layering secretions. Mild right maxillary sinus mucosal thickening. Right mastoid effusion with dehiscence of the bony posterior external auditory canal wall. Other: None. IMPRESSION: Small right corona radiata hypodensity may reflect acute/subacute insult versus chronic microvascular ischemic changes. Consider MRI if suspicion persists. Mild cerebral atrophy and chronic microvascular ischemic changes. Right mastoid effusion with dehiscence of the bony external auditory canal. Further evaluation with outpatient CT temporal bone is recommended. Electronically Signed   By: Milus Mallick.D.  On: 10/13/2020 14:11   CT Chest Wo Contrast  Result Date: 10/13/2020 CLINICAL DATA:  Shortness of breath in a patient with a history of COPD. EXAM: CT CHEST WITHOUT CONTRAST TECHNIQUE: Multidetector CT imaging of the chest was performed following the standard protocol without IV contrast. COMPARISON:  Single-view of the chest 10/13/2020 and  07/21/2008. FINDINGS: Cardiovascular: The patient has extensive calcific aortic and coronary atherosclerosis. The ascending aorta measures 4.8 cm transverse at the level of the pulmonary outflow tract. Heart size is upper normal. No pericardial effusion Mediastinum/Nodes: No enlarged mediastinal or axillary lymph nodes. Thyroid gland, trachea, and esophagus demonstrate no significant findings. Lungs/Pleura: There are small bilateral pleural effusions. The patient has severe central lobular and paraseptal emphysema. There is mild basilar atelectasis bilaterally. Upper Abdomen: No acute abnormality. Three cysts are seen in the liver. The largest is in the right lobe and measures 6.2 cm. Musculoskeletal: No acute or focal abnormality. Multilevel cyst thoracic spondylosis is noted. IMPRESSION: Small bilateral pleural effusions and dependent basilar atelectasis. 4.8 cm ascending thoracic aortic aneurysm. Recommend semi-annual imaging followup by CTA or MRA and referral to cardiothoracic surgery if not already obtained. This recommendation follows 2010 ACCF/AHA/AATS/ACR/ASA/SCA/SCAI/SIR/STS/SVM Guidelines for the Diagnosis and Management of Patients With Thoracic Aortic Disease. Circulation. 2010; 121: L798-X211. Aortic atherosclerosis (ICD10-I70.0) and severe emphysema (ICD10-J43.9). Extensive calcific coronary atherosclerosis also noted. Electronically Signed   By: Inge Rise M.D.   On: 10/13/2020 14:11   DG Chest Port 1 View  Result Date: 10/13/2020 CLINICAL DATA:  Shortness of breath. Per chart review, patient has bilateral lower lobe rales and bilateral lower extremity edema. EXAM: PORTABLE CHEST 1 VIEW COMPARISON:  07/21/2008 FINDINGS: There is diffuse interstitial prominence, suggestive of interstitial pulmonary edema. No confluent consolidation. No visible pleural effusions or pneumothorax on this limited AP portable radiograph. Mild enlargement the cardiac silhouette. Tortuous aorta. Aortic  atherosclerosis. IMPRESSION: 1. Diffuse interstitial prominence, suggestive of interstitial pulmonary edema. Atypical infection could have a similar appearance. 2. Mild cardiomegaly. Electronically Signed   By: Margaretha Sheffield MD   On: 10/13/2020 13:17    Procedures Procedures (including critical care time)  Medications Ordered in ED Medications  azithromycin (ZITHROMAX) 500 mg in sodium chloride 0.9 % 250 mL IVPB (has no administration in time range)  aspirin chewable tablet 324 mg (has no administration in time range)  ipratropium-albuterol (DUONEB) 0.5-2.5 (3) MG/3ML nebulizer solution 3 mL (has no administration in time range)  dextrose 50 % solution 50 mL (has no administration in time range)  methylPREDNISolone sodium succinate (SOLU-MEDROL) 125 mg/2 mL injection 125 mg (125 mg Intravenous Given 10/13/20 1307)  albuterol (VENTOLIN HFA) 108 (90 Base) MCG/ACT inhaler 4 puff (4 puffs Inhalation Given 10/13/20 1307)  ipratropium (ATROVENT HFA) inhaler 2 puff (2 puffs Inhalation Given 10/13/20 1307)  albuterol (VENTOLIN HFA) 108 (90 Base) MCG/ACT inhaler 2 puff (2 puffs Inhalation Given 10/13/20 1422)  cefTRIAXone (ROCEPHIN) 1 g in sodium chloride 0.9 % 100 mL IVPB (1 g Intravenous New Bag/Given 10/13/20 1422)    ED Course  I have reviewed the triage vital signs and the nursing notes.  Pertinent labs & imaging results that were available during my care of the patient were reviewed by me and considered in my medical decision making (see chart for details).    MDM Rules/Calculators/A&P                         Concern for CVA in this elderly patient with significant comorbidities.  Likely COPD exacerbation versus COVID-19 infection.  Patient has not been vaccinated.     Patient tachypneic on intake, 22.  Oxygen saturation 95% on 4 L supplemental O2 via nasal cannula.  Afebrile, normotensive.  CBG 53 initially on intake; patient given some orange juice.  Repeat CBG 68, will allow  patient to continue sipping on juice.  Patient obviously short of breath upon evaluation.  Physical exam concerning for diffuse wheezing, rales in lower lobes lobes  Neurologic exam with subtle left-sided weakness and impaired coordination.  Patient is outside of 24-hour window, as he states he is symptoms began "somewhere around noon yesterday".  Concern for CVA in this patient.  We will proceed with CT head without contrast, however will not enact code stroke at this time as patient is outside 24-hour window.  Chest x-ray, EKG, CBC, CMP, troponin, BNP, VBG, UA pending at this time  Covid swab pending.  Albuterol, ipratropium inhalers ordered, Solu-Medrol ordered for COPD exacerbation.  EKG sinus rhythm, borderline interventricular conduction delay.  I reevaluated the patient following administration of his inhalers.  His breath sounds have improved, there is less wheezing, though there is still notable wheezing in the right base and left upper lobe.  Will administer 2 more puffs of albuterol.   Chest x-ray concerning for diffuse interstitial prominence, suggestive interstitial pulmonary edema versus Atypical infection.  Mild cardiomegaly.  Will order CT of the chest for further evaluation, COVID swab pending.  Will cover with IV antibiotics for CAP in the meantime.  CBC with mild anemia 10.2, CMP with hyponatremia 130, elevated potassium 5.4.  UA remarkable for small ketones, 5  VBG with elevated has a base deficit of 4, low PCO2  42.3, PO2 elevated 170.  BNP elevated 178.9/ Troponin negative, 7.   Respiratory panel swab negative for COVID-19, influenza A/B  D50 ordered for persistent hypoglycemia.     CT chest with small bilateral pleural effusions, dependent basilar atelectasis.  Additional finding of 4.8 cm ascending thoracic aortic aneurysm (recommendation for semiannual CTA/MRA and cardiothoracic surgery follow-up).  Atherosclerosis, severe emphysema, calcific coronary  atherosclerosis  CT head concerning for small right corona radiata hypodensity acute/subacute insult versus chronic microvascular ischemic changes.  Consider MRI.  Also significant for right mastoid effusion as he has only sternal auditory canal, recommend CT temporal bone. Colleague Domenic Moras, PA-C consulted neurology Dr. Milas Gain who recommended completion of stroke work up with MRI and starting aspirin. Ordered in the ED .  Neurology PA will see this patient in the ED.   Reevaluation of the patient, he remains on 4 L supplemental O2 via nasal cannula, with O2 sats greater than 94%.  Pulmonary auscultation revealed persistent wheezing, will order DuoNeb as patient has tested negative for Covid.  I discussed plan for admission with both the patient and his daughter; they each voiced understanding of his treatment plan.  Each of their questions were answered to their expressed satisfaction.  Colleague Domenic Moras, PA-C consulted hospitalist Dr. Doristine Bosworth who is agreeable to see him in the ED and to admit this patient for hypoxia in context of COPD exacerbation as well as apparent acute stroke. We appreciate their willingness to see this patient and participate in his care.   Brandon [name] was evaluated in Emergency Department on 10/13/2020 for the symptoms described in the history of present illness. He was evaluated in the context of the global COVID-19 pandemic, which necessitated consideration that the patient might be at risk for infection with the SARS-CoV-2 virus  that causes COVID-19. Institutional protocols and algorithms that pertain to the evaluation of patients at risk for COVID-19 are in a state of rapid change based on information released by regulatory bodies including the CDC and federal and state organizations. These policies and algorithms were followed during the patient's care in the ED.  Final Clinical Impression(s) / ED Diagnoses Final diagnoses:  COPD exacerbation ([place])  Weakness     Rx / DC Orders ED Discharge Orders    None       Aura Dials 10/13/20 1545    Lucrezia Starch, MD 10/18/20 2337

## 2020-10-13 NOTE — Progress Notes (Signed)
Received call from Mardene Celeste, daughter, asking about patients medical condition.  Per patient it is ok to release any medical information to her as needed.  Daughter updated on patients condition

## 2020-10-13 NOTE — ED Provider Notes (Signed)
Appreciate consultation from on-call neurologist Sal, who recommend stroke work-up, give patient aspirin, and will discuss with medicine for admission.  Pt also experiencing COPD exacerbation.  Will treat.  1 amp of D-50 for hypoglycemia.    I have consulted with [facility] Dr. Doristine Bosworth who agrees to see and will admit pt for further care.    CRITICAL CARE Performed by: Domenic Moras Total critical care time: 35 minutes Critical care time was exclusive of separately billable procedures and treating other patients. Critical care was necessary to treat or prevent imminent or life-threatening deterioration. Critical care was time spent personally by me on the following activities: development of treatment plan with patient and/or surrogate as well as nursing, discussions with consultants, evaluation of patient's response to treatment, examination of patient, obtaining history from patient or surrogate, ordering and performing treatments and interventions, ordering and review of laboratory studies, ordering and review of radiographic studies, pulse oximetry and re-evaluation of patient's condition.  BP 106/60   Pulse 80   Temp 98.7 F (37.1 C) (Oral)   Resp (!) 23   SpO2 98%   Results for orders placed or performed during the hospital encounter of 10/13/20  Respiratory Panel by RT PCR (Flu A&B, Covid) - Nasopharyngeal Swab   Specimen: Nasopharyngeal Swab  Result Value Ref Range   SARS Coronavirus 2 by RT PCR NEGATIVE NEGATIVE   Influenza A by PCR NEGATIVE NEGATIVE   Influenza B by PCR NEGATIVE NEGATIVE  Comprehensive metabolic panel  Result Value Ref Range   Sodium 130 (L) 135 - 145 mmol/L   Potassium 5.4 (H) 3.5 - 5.1 mmol/L   Chloride 99 98 - 111 mmol/L   CO2 19 (L) 22 - 32 mmol/L   Glucose, Bld 95 70 - 99 mg/dL   BUN 15 8 - 23 mg/dL   Creatinine, Ser 1.05 0.61 - 1.24 mg/dL   Calcium 9.1 8.9 - 10.3 mg/dL   Total Protein 6.6 6.5 - 8.1 g/dL   Albumin 3.4 (L) 3.5 - 5.0 g/dL   AST  17 15 - 41 U/L   ALT 15 0 - 44 U/L   Alkaline Phosphatase 60 38 - 126 U/L   Total Bilirubin 0.7 0.3 - 1.2 mg/dL   GFR, Estimated >60 >60 mL/min   Anion gap 12 5 - 15  CBC with Differential  Result Value Ref Range   WBC 8.1 4.0 - 10.5 K/uL   RBC 4.59 4.22 - 5.81 MIL/uL   Hemoglobin 10.2 (L) 13.0 - 17.0 g/dL   HCT 34.4 (L) 39 - 52 %   MCV 74.9 (L) 80.0 - 100.0 fL   MCH 22.2 (L) 26.0 - 34.0 pg   MCHC 29.7 (L) 30.0 - 36.0 g/dL   RDW 22.1 (H) 11.5 - 15.5 %   Platelets 329 150 - 400 K/uL   nRBC 0.2 0.0 - 0.2 %   Neutrophils Relative % 80 %   Neutro Abs 6.4 1.7 - 7.7 K/uL   Lymphocytes Relative 15 %   Lymphs Abs 1.2 0.7 - 4.0 K/uL   Monocytes Relative 5 %   Monocytes Absolute 0.4 0.1 - 1.0 K/uL   Eosinophils Relative 0 %   Eosinophils Absolute 0.0 0.0 - 0.5 K/uL   Basophils Relative 0 %   Basophils Absolute 0.0 0.0 - 0.1 K/uL   Immature Granulocytes 0 %   Abs Immature Granulocytes 0.03 0.00 - 0.07 K/uL   Polychromasia PRESENT   Brain natriuretic peptide  Result Value Ref Range  B Natriuretic Peptide 178.9 (H) 0.0 - 100.0 pg/mL  Urinalysis, Routine w reflex microscopic  Result Value Ref Range   Color, Urine YELLOW YELLOW   APPearance CLEAR CLEAR   Specific Gravity, Urine 1.009 1.005 - 1.030   pH 6.0 5.0 - 8.0   Glucose, UA NEGATIVE NEGATIVE mg/dL   Hgb urine dipstick NEGATIVE NEGATIVE   Bilirubin Urine NEGATIVE NEGATIVE   Ketones, ur 5 (A) NEGATIVE mg/dL   Protein, ur NEGATIVE NEGATIVE mg/dL   Nitrite NEGATIVE NEGATIVE   Leukocytes,Ua NEGATIVE NEGATIVE  CBG monitoring, ED  Result Value Ref Range   Glucose-Capillary 53 (L) 70 - 99 mg/dL  CBG monitoring, ED  Result Value Ref Range   Glucose-Capillary 68 (L) 70 - 99 mg/dL  I-Stat venous blood gas, ED  Result Value Ref Range   pH, Ven 7.323 7.25 - 7.43   pCO2, Ven 42.3 (L) 44 - 60 mmHg   pO2, Ven 170.0 (H) 32 - 45 mmHg   Bicarbonate 21.9 20.0 - 28.0 mmol/L   TCO2 23 22 - 32 mmol/L   O2 Saturation 99.0 %   Acid-base  deficit 4.0 (H) 0.0 - 2.0 mmol/L   Sodium 131 (L) 135 - 145 mmol/L   Potassium 5.1 3.5 - 5.1 mmol/L   Calcium, Ion 1.16 1.15 - 1.40 mmol/L   HCT 36.0 (L) 39 - 52 %   Hemoglobin 12.2 (L) 13.0 - 17.0 g/dL   Sample type VENOUS   CBG monitoring, ED  Result Value Ref Range   Glucose-Capillary 144 (H) 70 - 99 mg/dL  Troponin I (High Sensitivity)  Result Value Ref Range   Troponin I (High Sensitivity) 7 <18 ng/L   CT Head Wo Contrast  Result Date: 10/13/2020 CLINICAL DATA:  Neuro deficit, left arm and leg weakness. EXAM: CT HEAD WITHOUT CONTRAST TECHNIQUE: Contiguous axial images were obtained from the base of the skull through the vertex without intravenous contrast. COMPARISON:  None. FINDINGS: Brain: No intracranial hemorrhage. Small right corona radiata hypodensity. No mass lesion. No midline shift, ventriculomegaly or extra-axial fluid collection. Mild diffuse cerebral atrophy with ex vacuo dilatation. Scattered and confluent periventricular and subcortical white matter hypodensities are nonspecific however commonly associated with chronic microvascular ischemic changes. Vascular: No hyperdense vessel or unexpected calcification. Bilateral skull base atherosclerotic calcifications. Skull: Negative for fracture or focal lesion. Sinuses/Orbits: Normal orbits. Sequela chronic of maxillary sinus disease with moderate mucosal thickening and layering secretions. Mild right maxillary sinus mucosal thickening. Right mastoid effusion with dehiscence of the bony posterior external auditory canal wall. Other: None. IMPRESSION: Small right corona radiata hypodensity may reflect acute/subacute insult versus chronic microvascular ischemic changes. Consider MRI if suspicion persists. Mild cerebral atrophy and chronic microvascular ischemic changes. Right mastoid effusion with dehiscence of the bony external auditory canal. Further evaluation with outpatient CT temporal bone is recommended. Electronically Signed    By: Primitivo Gauze M.D.   On: 10/13/2020 14:11   CT Chest Wo Contrast  Result Date: 10/13/2020 CLINICAL DATA:  Shortness of breath in a patient with a history of COPD. EXAM: CT CHEST WITHOUT CONTRAST TECHNIQUE: Multidetector CT imaging of the chest was performed following the standard protocol without IV contrast. COMPARISON:  Single-view of the chest 10/13/2020 and 07/21/2008. FINDINGS: Cardiovascular: The patient has extensive calcific aortic and coronary atherosclerosis. The ascending aorta measures 4.8 cm transverse at the level of the pulmonary outflow tract. Heart size is upper normal. No pericardial effusion Mediastinum/Nodes: No enlarged mediastinal or axillary lymph nodes. Thyroid gland, trachea, and  esophagus demonstrate no significant findings. Lungs/Pleura: There are small bilateral pleural effusions. The patient has severe central lobular and paraseptal emphysema. There is mild basilar atelectasis bilaterally. Upper Abdomen: No acute abnormality. Three cysts are seen in the liver. The largest is in the right lobe and measures 6.2 cm. Musculoskeletal: No acute or focal abnormality. Multilevel cyst thoracic spondylosis is noted. IMPRESSION: Small bilateral pleural effusions and dependent basilar atelectasis. 4.8 cm ascending thoracic aortic aneurysm. Recommend semi-annual imaging followup by CTA or MRA and referral to cardiothoracic surgery if not already obtained. This recommendation follows 2010 ACCF/AHA/AATS/ACR/ASA/SCA/SCAI/SIR/STS/SVM Guidelines for the Diagnosis and Management of Patients With Thoracic Aortic Disease. Circulation. 2010; 121: B048-G891. Aortic atherosclerosis (ICD10-I70.0) and severe emphysema (ICD10-J43.9). Extensive calcific coronary atherosclerosis also noted. Electronically Signed   By: Inge Rise M.D.   On: 10/13/2020 14:11   DG Chest Port 1 View  Result Date: 10/13/2020 CLINICAL DATA:  Shortness of breath. Per chart review, patient has bilateral lower lobe  rales and bilateral lower extremity edema. EXAM: PORTABLE CHEST 1 VIEW COMPARISON:  07/21/2008 FINDINGS: There is diffuse interstitial prominence, suggestive of interstitial pulmonary edema. No confluent consolidation. No visible pleural effusions or pneumothorax on this limited AP portable radiograph. Mild enlargement the cardiac silhouette. Tortuous aorta. Aortic atherosclerosis. IMPRESSION: 1. Diffuse interstitial prominence, suggestive of interstitial pulmonary edema. Atypical infection could have a similar appearance. 2. Mild cardiomegaly. Electronically Signed   By: Margaretha Sheffield MD   On: 10/13/2020 13:17       Domenic Moras, PA-C 10/13/20 1544    Lucrezia Starch, MD 10/18/20 [phone]

## 2020-10-13 NOTE — ED Notes (Signed)
Pt provided with 8oz OJ per PA request at this time. Pt remains a&ox4.

## 2020-10-13 NOTE — ED Notes (Signed)
Pt provided with 8oz OJ

## 2020-10-14 ENCOUNTER — Inpatient Hospital Stay (HOSPITAL_COMMUNITY): Payer: No Typology Code available for payment source

## 2020-10-14 DIAGNOSIS — I639 Cerebral infarction, unspecified: Secondary | ICD-10-CM | POA: Diagnosis not present

## 2020-10-14 DIAGNOSIS — I10 Essential (primary) hypertension: Secondary | ICD-10-CM | POA: Diagnosis not present

## 2020-10-14 DIAGNOSIS — I6389 Other cerebral infarction: Secondary | ICD-10-CM

## 2020-10-14 DIAGNOSIS — I6340 Cerebral infarction due to embolism of unspecified cerebral artery: Secondary | ICD-10-CM | POA: Diagnosis not present

## 2020-10-14 LAB — BASIC METABOLIC PANEL
Anion gap: 10 (ref 5–15)
BUN: 13 mg/dL (ref 8–23)
CO2: 22 mmol/L (ref 22–32)
Calcium: 8.8 mg/dL — ABNORMAL LOW (ref 8.9–10.3)
Chloride: 98 mmol/L (ref 98–111)
Creatinine, Ser: 1.1 mg/dL (ref 0.61–1.24)
GFR, Estimated: >60 mL/min (ref >60)
Glucose, Bld: 267 mg/dL — ABNORMAL HIGH (ref 70–99)
Potassium: 4.2 mmol/L (ref 3.5–5.1)
Sodium: 130 mmol/L — ABNORMAL LOW (ref 135–145)

## 2020-10-14 LAB — GLUCOSE, CAPILLARY
Glucose-Capillary: 228 mg/dL — ABNORMAL HIGH (ref 70–99)
Glucose-Capillary: 206 mg/dL — ABNORMAL HIGH (ref 70–99)
Glucose-Capillary: 190 mg/dL — ABNORMAL HIGH (ref 70–99)
Glucose-Capillary: 189 mg/dL — ABNORMAL HIGH (ref 70–99)
Glucose-Capillary: 163 mg/dL — ABNORMAL HIGH (ref 70–99)

## 2020-10-14 LAB — ECHOCARDIOGRAM COMPLETE
Area-P 1/2: 3.99 cm2
Height: 72 in
S' Lateral: 2.8 cm
Weight: 2977.09 oz

## 2020-10-14 LAB — CBC
HCT: 30.7 % — ABNORMAL LOW (ref 39.0–52.0)
Hemoglobin: 9.6 g/dL — ABNORMAL LOW (ref 13.0–17.0)
MCH: 22.4 pg — ABNORMAL LOW (ref 26.0–34.0)
MCHC: 31.3 g/dL (ref 30.0–36.0)
MCV: 71.6 fL — ABNORMAL LOW (ref 80.0–100.0)
Platelets: 326 10*3/uL (ref 150–400)
RBC: 4.29 MIL/uL (ref 4.22–5.81)
RDW: 21.8 % — ABNORMAL HIGH (ref 11.5–15.5)
WBC: 4.2 10*3/uL (ref 4.0–10.5)
nRBC: 0 % (ref 0.0–0.2)

## 2020-10-14 LAB — MAGNESIUM: Magnesium: 1.5 mg/dL — ABNORMAL LOW (ref 1.7–2.4)

## 2020-10-14 LAB — TSH: TSH: 0.467 u[IU]/mL (ref 0.350–4.500)

## 2020-10-14 LAB — FERRITIN: Ferritin: 19 ng/mL — ABNORMAL LOW (ref 24–336)

## 2020-10-14 LAB — IRON AND TIBC
Iron: 19 ug/dL — ABNORMAL LOW (ref 45–182)
Saturation Ratios: 5 % — ABNORMAL LOW (ref 17.9–39.5)
TIBC: 350 ug/dL (ref 250–450)
UIBC: 331 ug/dL

## 2020-10-14 LAB — LIPID PANEL
Cholesterol: 92 mg/dL (ref 0–200)
HDL: 41 mg/dL (ref >40)
LDL Cholesterol: 42 mg/dL (ref 0–99)
Total CHOL/HDL Ratio: 2.2 RATIO
Triglycerides: 45 mg/dL (ref <150)
VLDL: 9 mg/dL (ref 0–40)

## 2020-10-14 MED ORDER — FERROUS SULFATE 325 (65 FE) MG PO TABS
325.0000 mg | ORAL_TABLET | Freq: Three times a day (TID) | ORAL | Status: DC
  Administered 2020-10-15 – 2020-10-21 (×21): 325 mg via ORAL
  Filled 2020-10-14 (×21): qty 1

## 2020-10-14 MED ORDER — TIOTROPIUM BROMIDE MONOHYDRATE 2.5 MCG/ACT IN AERS: 2.0000 | INHALATION_SPRAY | Freq: Every day | RESPIRATORY_TRACT | Status: DC

## 2020-10-14 MED ORDER — MOMETASONE FURO-FORMOTEROL FUM 200-5 MCG/ACT IN AERO
2.0000 | INHALATION_SPRAY | Freq: Two times a day (BID) | RESPIRATORY_TRACT | Status: DC
  Administered 2020-10-14 – 2020-10-21 (×14): 2 via RESPIRATORY_TRACT
  Filled 2020-10-14: qty 8.8

## 2020-10-14 MED ORDER — ATORVASTATIN CALCIUM 40 MG PO TABS
40.0000 mg | ORAL_TABLET | Freq: Every day | ORAL | Status: DC
  Administered 2020-10-14 – 2020-10-20 (×7): 40 mg via ORAL
  Filled 2020-10-14 (×7): qty 1

## 2020-10-14 MED ORDER — FUROSEMIDE 10 MG/ML IJ SOLN
40.0000 mg | Freq: Once | INTRAMUSCULAR | Status: AC
  Administered 2020-10-14: 40 mg via INTRAVENOUS
  Filled 2020-10-14 (×2): qty 4

## 2020-10-14 MED ORDER — ASPIRIN EC 81 MG PO TBEC
81.0000 mg | DELAYED_RELEASE_TABLET | Freq: Every day | ORAL | Status: DC
  Administered 2020-10-15 – 2020-10-21 (×7): 81 mg via ORAL
  Filled 2020-10-14 (×7): qty 1

## 2020-10-14 MED ORDER — CILOSTAZOL 50 MG PO TABS
50.0000 mg | ORAL_TABLET | Freq: Two times a day (BID) | ORAL | Status: DC
  Administered 2020-10-14 – 2020-10-21 (×14): 50 mg via ORAL
  Filled 2020-10-14 (×15): qty 1

## 2020-10-14 MED ORDER — OXYBUTYNIN CHLORIDE ER 5 MG PO TB24
10.0000 mg | ORAL_TABLET | Freq: Every day | ORAL | Status: DC
  Administered 2020-10-14 – 2020-10-21 (×8): 10 mg via ORAL
  Filled 2020-10-14 (×8): qty 2

## 2020-10-14 MED ORDER — MAGNESIUM SULFATE 2 GM/50ML IV SOLN
2.0000 g | Freq: Once | INTRAVENOUS | Status: AC
  Administered 2020-10-14: 2 g via INTRAVENOUS
  Filled 2020-10-14: qty 50

## 2020-10-14 MED ORDER — NORTRIPTYLINE HCL 25 MG PO CAPS
50.0000 mg | ORAL_CAPSULE | Freq: Every day | ORAL | Status: DC
  Administered 2020-10-14 – 2020-10-20 (×7): 50 mg via ORAL
  Filled 2020-10-14 (×7): qty 2

## 2020-10-14 MED ORDER — CLOPIDOGREL BISULFATE 75 MG PO TABS
75.0000 mg | ORAL_TABLET | Freq: Every day | ORAL | Status: DC
  Administered 2020-10-14 – 2020-10-21 (×8): 75 mg via ORAL
  Filled 2020-10-14 (×8): qty 1

## 2020-10-14 MED ORDER — INSULIN ASPART PROT & ASPART (70-30 MIX) 100 UNIT/ML ~~LOC~~ SUSP
30.0000 [IU] | Freq: Two times a day (BID) | SUBCUTANEOUS | Status: DC
  Administered 2020-10-14: 30 [IU] via SUBCUTANEOUS
  Filled 2020-10-14: qty 10

## 2020-10-14 MED ORDER — INSULIN ASPART PROT & ASPART (70-30 MIX) 100 UNIT/ML PEN: 30.0000 [IU] | PEN_INJECTOR | Freq: Two times a day (BID) | SUBCUTANEOUS | Status: DC

## 2020-10-14 MED ORDER — ADULT MULTIVITAMIN W/MINERALS CH
1.0000 | ORAL_TABLET | Freq: Every day | ORAL | Status: DC
  Administered 2020-10-14 – 2020-10-21 (×8): 1 via ORAL
  Filled 2020-10-14 (×8): qty 1

## 2020-10-14 MED ORDER — UMECLIDINIUM BROMIDE 62.5 MCG/INH IN AEPB
1.0000 | INHALATION_SPRAY | Freq: Every day | RESPIRATORY_TRACT | Status: DC
  Administered 2020-10-15 – 2020-10-21 (×7): 1 via RESPIRATORY_TRACT
  Filled 2020-10-14: qty 7

## 2020-10-14 MED ORDER — PANTOPRAZOLE SODIUM 40 MG PO TBEC
40.0000 mg | DELAYED_RELEASE_TABLET | Freq: Every day | ORAL | Status: DC
  Administered 2020-10-14 – 2020-10-21 (×8): 40 mg via ORAL
  Filled 2020-10-14 (×8): qty 1

## 2020-10-14 NOTE — Progress Notes (Signed)
Inpatient Diabetes Program Recommendations  AACE/ADA: New Consensus Statement on Inpatient Glycemic Control (2015)  Target Ranges:  Prepandial:   less than 140 mg/dL      Peak postprandial:   less than 180 mg/dL (1-2 hours)      Critically ill patients:  140 - 180 mg/dL   Lab Results  Component Value Date   GLUCAP 206 (H) 10/14/2020    Review of Glycemic Control Results for Brandon [name], Brandon [name] (MRN [id number]) as of 10/14/2020 10:51  Ref. Range 10/13/2020 12:16 10/13/2020 15:32 10/13/2020 22:51 10/14/2020 06:02 10/14/2020 07:59  Glucose-Capillary Latest Ref Range: 70 - 99 mg/dL 68 (L) 144 (H) 304 (H) 228 (H) 206 (H)   Diabetes history: None  Current orders for Inpatient glycemic control:  Novolog 0-15 units tid + hs  Inpatient Diabetes Program Recommendations:    125 Solumedrol given in ED for COPD exacerbation causing glucose trends to be elevated over night into this am. Watch on Novolog Correction scale for now. Glucose 53 on presentation to ED. A1c Pending.  Thanks,  Tama Headings RN, MSN, BC-ADM Inpatient Diabetes Coordinator Team Pager [phone] (8a-5p)

## 2020-10-14 NOTE — Progress Notes (Signed)
STROKE TEAM PROGRESS NOTE   INTERVAL HISTORY I have personally reviewed history of presenting illness, electronic medical records and available imaging films in PACS.  He presented with shortness of breath but has chronic COPD and is on home oxygen.  He also complains of left leg weakness which is improving today but is not back to baseline.  MRI scan of the brain shows bilateral right greater than left embolic infarcts.  CT angiogram shows no significant large vessel stenosis or occlusion.  Echocardiogram is pending.  LDL cholesterol is 42 mg percent.  Vitals:   10/14/20 0242 10/14/20 0323 10/14/20 0442 10/14/20 0754  BP: 119/68  105/66 115/67  Pulse: 85 87 87 92  Resp: 14 18 14 19   Temp: 97.8 F (36.6 C)  97.9 F (36.6 C) 97.8 F (36.6 C)  TempSrc: Oral  Oral Oral  SpO2: 97% 100% 98% 100%  Weight:      Height:       CBC:  Recent Labs  Lab 10/13/20 1215 10/13/20 1215 10/13/20 1332 10/14/20 0126  WBC 8.1  --   --  4.2  NEUTROABS 6.4  --   --   --   HGB 10.2*   < > 12.2* 9.6*  HCT 34.4*   < > 36.0* 30.7*  MCV 74.9*  --   --  71.6*  PLT 329  --   --  326   < > = values in this interval not displayed.   Basic Metabolic Panel:  Recent Labs  Lab 10/13/20 1215 10/13/20 1215 10/13/20 1332 10/14/20 0126  NA 130*   < > 131* 130*  K 5.4*   < > 5.1 4.2  CL 99  --   --  98  CO2 19*  --   --  22  GLUCOSE 95  --   --  267*  BUN 15  --   --  13  CREATININE 1.05  --   --  1.10  CALCIUM 9.1  --   --  8.8*  MG  --   --   --  1.5*   < > = values in this interval not displayed.   Lipid Panel:  Recent Labs  Lab 10/14/20 0126  CHOL 92  TRIG 45  HDL 41  CHOLHDL 2.2  VLDL 9  LDLCALC 42   HgbA1c: No results for input(s): HGBA1C in the last 168 hours. Urine Drug Screen: No results for input(s): LABOPIA, COCAINSCRNUR, LABBENZ, AMPHETMU, THCU, LABBARB in the last 168 hours.  Alcohol Level No results for input(s): ETH in the last 168 hours.  IMAGING past 24 hours CT ANGIO HEAD  W OR WO CONTRAST  Result Date: 10/13/2020 CLINICAL DATA:  Left-sided weakness EXAM: CT ANGIOGRAPHY HEAD AND NECK TECHNIQUE: Multidetector CT imaging of the head and neck was performed using the standard protocol during bolus administration of intravenous contrast. Multiplanar CT image reconstructions and MIPs were obtained to evaluate the vascular anatomy. Carotid stenosis measurements (when applicable) are obtained utilizing NASCET criteria, using the distal internal carotid diameter as the denominator. CONTRAST:  45mL OMNIPAQUE IOHEXOL 350 MG/ML SOLN COMPARISON:  CT head earlier same day FINDINGS: CT HEAD Brain: There is no acute intracranial hemorrhage, mass effect, or edema. Gray-white differentiation is preserved. There is no extra-axial fluid collection. Prominence of the ventricles and sulci reflecting stable parenchymal volume loss. Patchy hypoattenuation in the supratentorial white matter likely reflects stable chronic microvascular ischemic changes. As noted previously, a superimposed small vessel infarct of the right corona  radiata is not excluded. Vascular: No hyperdense vessel. Skull: Calvarium is unremarkable. Sinuses/Orbits: No acute finding. Other: None. Review of the MIP images confirms the above findings CTA NECK Aortic arch: Calcified plaque along the arch and great vessel origins, which are patent. Right carotid system: Patent. Primarily calcified plaque along the proximal carotid causes less than 50% stenosis. Left carotid system: Patent. Primarily calcified plaque along the proximal internal carotid causes less than 50% stenosis. There is also calcified plaque along the distal internal carotid causing minimal stenosis. Vertebral arteries: Patent. Right vertebral artery is dominant. Multifocal calcified plaque along the right vertebral artery causing up to moderate stenosis. Skeleton: Multilevel cervical spine degenerative changes. Other neck: No mass or adenopathy. Upper chest: Advanced  emphysema.  Trace right pleural effusion. Review of the MIP images confirms the above findings CTA HEAD Anterior circulation: Intracranial internal carotid arteries are patent with calcified plaque causing mild stenosis. Anterior cerebral arteries are patent. 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 Middle cerebral arteries are patent. Posterior circulation: Intracranial vertebral arteries are patent with calcified plaque causing mild stenosis. Basilar artery is patent. Posterior cerebral arteries are patent. Right larger than left posterior communicating arteries are present. Venous sinuses: Patent as allowed by contrast bolus timing. Review of the MIP images confirms the above findings IMPRESSION: No acute intracranial abnormality. No large vessel occlusion or hemodynamically significant stenosis. Electronically Signed   By: Macy Mis M.D.   On: 10/13/2020 17:00   CT Head Wo Contrast  Result Date: 10/13/2020 CLINICAL DATA:  Neuro deficit, left arm and leg weakness. EXAM: CT HEAD WITHOUT CONTRAST TECHNIQUE: Contiguous axial images were obtained from the base of the skull through the vertex without intravenous contrast. COMPARISON:  None. FINDINGS: Brain: No intracranial hemorrhage. Small right corona radiata hypodensity. No mass lesion. No midline shift, ventriculomegaly or extra-axial fluid collection. Mild diffuse cerebral atrophy with ex vacuo dilatation. Scattered and confluent periventricular and subcortical white matter hypodensities are nonspecific however commonly associated with chronic microvascular ischemic changes. Vascular: No hyperdense vessel or unexpected calcification. Bilateral skull base atherosclerotic calcifications. Skull: Negative for fracture or focal lesion. Sinuses/Orbits: Normal orbits. Sequela chronic of maxillary sinus disease with moderate mucosal thickening and layering secretions. Mild right maxillary sinus mucosal thickening. Right mastoid effusion with dehiscence of  the bony posterior external auditory canal wall. Other: None. IMPRESSION: Small right corona radiata hypodensity may reflect acute/subacute insult versus chronic microvascular ischemic changes. Consider MRI if suspicion persists. Mild cerebral atrophy and chronic microvascular ischemic changes. Right mastoid effusion with dehiscence of the bony external auditory canal. Further evaluation with outpatient CT temporal bone is recommended. Electronically Signed   By: Primitivo Gauze M.D.   On: 10/13/2020 14:11   CT ANGIO NECK W OR WO CONTRAST  Result Date: 10/13/2020 CLINICAL DATA:  Left-sided weakness EXAM: CT ANGIOGRAPHY HEAD AND NECK TECHNIQUE: Multidetector CT imaging of the head and neck was performed using the standard protocol during bolus administration of intravenous contrast. Multiplanar CT image reconstructions and MIPs were obtained to evaluate the vascular anatomy. Carotid stenosis measurements (when applicable) are obtained utilizing NASCET criteria, using the distal internal carotid diameter as the denominator. CONTRAST:  42mL OMNIPAQUE IOHEXOL 350 MG/ML SOLN COMPARISON:  CT head earlier same day FINDINGS: CT HEAD Brain: There is no acute intracranial hemorrhage, mass effect, or edema. Gray-white differentiation is preserved. There is no extra-axial fluid collection. Prominence of the ventricles and sulci reflecting stable parenchymal volume loss. Patchy hypoattenuation in the supratentorial white matter likely reflects  stable chronic microvascular ischemic changes. As noted previously, a superimposed small vessel infarct of the right corona radiata is not excluded. Vascular: No hyperdense vessel. Skull: Calvarium is unremarkable. Sinuses/Orbits: No acute finding. Other: None. Review of the MIP images confirms the above findings CTA NECK Aortic arch: Calcified plaque along the arch and great vessel origins, which are patent. Right carotid system: Patent. Primarily calcified plaque along the  proximal carotid causes less than 50% stenosis. Left carotid system: Patent. Primarily calcified plaque along the proximal internal carotid causes less than 50% stenosis. There is also calcified plaque along the distal internal carotid causing minimal stenosis. Vertebral arteries: Patent. Right vertebral artery is dominant. Multifocal calcified plaque along the right vertebral artery causing up to moderate stenosis. Skeleton: Multilevel cervical spine degenerative changes. Other neck: No mass or adenopathy. Upper chest: Advanced emphysema.  Trace right pleural effusion. Review of the MIP images confirms the above findings CTA HEAD Anterior circulation: Intracranial internal carotid arteries are patent with calcified plaque causing mild stenosis. Anterior cerebral arteries are patent. 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 Middle cerebral arteries are patent. Posterior circulation: Intracranial vertebral arteries are patent with calcified plaque causing mild stenosis. Basilar artery is patent. Posterior cerebral arteries are patent. Right larger than left posterior communicating arteries are present. Venous sinuses: Patent as allowed by contrast bolus timing. Review of the MIP images confirms the above findings IMPRESSION: No acute intracranial abnormality. No large vessel occlusion or hemodynamically significant stenosis. Electronically Signed   By: Macy Mis M.D.   On: 10/13/2020 17:00   CT Chest Wo Contrast  Result Date: 10/13/2020 CLINICAL DATA:  Shortness of breath in a patient with a history of COPD. EXAM: CT CHEST WITHOUT CONTRAST TECHNIQUE: Multidetector CT imaging of the chest was performed following the standard protocol without IV contrast. COMPARISON:  Single-view of the chest 10/13/2020 and 07/21/2008. FINDINGS: Cardiovascular: The patient has extensive calcific aortic and coronary atherosclerosis. The ascending aorta measures 4.8 cm transverse at the level of the pulmonary outflow tract.  Heart size is upper normal. No pericardial effusion Mediastinum/Nodes: No enlarged mediastinal or axillary lymph nodes. Thyroid gland, trachea, and esophagus demonstrate no significant findings. Lungs/Pleura: There are small bilateral pleural effusions. The patient has severe central lobular and paraseptal emphysema. There is mild basilar atelectasis bilaterally. Upper Abdomen: No acute abnormality. Three cysts are seen in the liver. The largest is in the right lobe and measures 6.2 cm. Musculoskeletal: No acute or focal abnormality. Multilevel cyst thoracic spondylosis is noted. IMPRESSION: Small bilateral pleural effusions and dependent basilar atelectasis. 4.8 cm ascending thoracic aortic aneurysm. Recommend semi-annual imaging followup by CTA or MRA and referral to cardiothoracic surgery if not already obtained. This recommendation follows 2010 ACCF/AHA/AATS/ACR/ASA/SCA/SCAI/SIR/STS/SVM Guidelines for the Diagnosis and Management of Patients With Thoracic Aortic Disease. Circulation. 2010; 121: V564-P329. Aortic atherosclerosis (ICD10-I70.0) and severe emphysema (ICD10-J43.9). Extensive calcific coronary atherosclerosis also noted. Electronically Signed   By: Inge Rise M.D.   On: 10/13/2020 14:11   MR BRAIN WO CONTRAST  Result Date: 10/13/2020 CLINICAL DATA:  Acute neurologic deficit EXAM: MRI HEAD WITHOUT CONTRAST TECHNIQUE: Multiplanar, multiecho pulse sequences of the brain and surrounding structures were obtained without intravenous contrast. COMPARISON:  10/13/2020 CTA head FINDINGS: Brain: Multiple small acute infarcts within both hemispheres, including the right precentral gyrus, left middle frontal gyrus and paramedian left parietal lobe. No acute hemorrhage. Early confluent hyperintense T2-weighted signal of the periventricular and deep white matter. There is generalized atrophy without lobar predilection. No chronic  microhemorrhage. Normal midline structures. Choose 1 Vascular: Normal flow  voids. Skull and upper cervical spine: Normal marrow signal. Sinuses/Orbits: Right mastoid effusion and right ocular lens replacement. Other: None IMPRESSION: 1. Multiple small acute infarcts within both hemispheres, possibly of an embolic etiology. 2. Generalized atrophy and findings of chronic microvascular disease. Electronically Signed   By: Ulyses Jarred M.D.   On: 10/13/2020 19:47   DG Chest Port 1 View  Result Date: 10/13/2020 CLINICAL DATA:  Shortness of breath. Per chart review, patient has bilateral lower lobe rales and bilateral lower extremity edema. EXAM: PORTABLE CHEST 1 VIEW COMPARISON:  07/21/2008 FINDINGS: There is diffuse interstitial prominence, suggestive of interstitial pulmonary edema. No confluent consolidation. No visible pleural effusions or pneumothorax on this limited AP portable radiograph. Mild enlargement the cardiac silhouette. Tortuous aorta. Aortic atherosclerosis. IMPRESSION: 1. Diffuse interstitial prominence, suggestive of interstitial pulmonary edema. Atypical infection could have a similar appearance. 2. Mild cardiomegaly. Electronically Signed   By: Margaretha Sheffield MD   On: 10/13/2020 13:17    PHYSICAL EXAM Pleasant elderly male not in distress.  He is on nasal cannula oxygen. . Afebrile. Head is nontraumatic. Neck is supple without bruit.    Cardiac exam no murmur or gallop. Lungs are clear to auscultation. Distal pulses are well felt.  Neurological Exam : Awake alert oriented to time place and person.  Speech and language appear normal.  Extraocular movements are full range without nystagmus.  Blinks to threat bilaterally.  Face is symmetric without weakness.  Tongue midline.  Motor system exam shows no upper or lower extremity drift.  Diminished fine finger movement on the left and orbits right over left upper extremity.  Mild left lower extremity drift with weakness of left hip flexor and ankle dorsiflexors 4/5.  Sensation is intact.  Gait not tested.    ASSESSMENT/PLAN [name] is a 74 y.o. male with history of type 2 diabetes, peripheral vascular disease, paroxysmal atrial tachycardia, essential hypertension, tobacco abuse and chronic alcoholism presenting with SOB, sudden onset non-dermatomal LLE weakness w/ B pedal edema and stock distribution sensory deficit.   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 Mild Small vessel disease. Mild Atrophy. R mastoid effusion w/ dehiscence  CTA head & neck Unremarkable   MRI  Multiple small B infarct. Small vessel disease. Atrophy.   2D Echo pending   Consider 30d monitor to look for AF as source of stroke  LDL 42  HgbA1c pending   VTE prophylaxis - SCDs   aspirin 81 mg daily and pletal prior to admission, now on aspirin 325 mg daily. Will decrease aspirin and add plavix x 3 weeks. After 3 weeks, continue Plavix and pletal.    Therapy recommendations:  CIR  Disposition:  pending   Hypertension  Stable . Permissive hypertension (OK if < 220/120) but gradually normalize in 5-7 days . Long-term BP goal normotensive  Hyperlipidemia  Home meds:  lipitor 40  Resumed lipitor in hospital  LDL 42, goal < 70  Continue statin at discharge  Diabetes type II  HgbA1c pending, goal < 7.0  CBGs Recent Labs    10/13/20 2251 10/14/20 0602 10/14/20 0759  GLUCAP 304* 228* 206*      SSI  Glucoses elevated d/t solumedrol in ED for COPD exacerbation  Other Stroke Risk Factors  Advanced age  Cigarette smoker, admits to still smoking a  few cigarettes daily, Dr. Leonie Man advised to stop smoking  Chronic ETOH use  Coronary artery disease s/p stent  PVD  Other Active Problems  Acute on chronic hypoxemic respirator failure d/t COPD exacerbation vs acute CHF w/ low sats  Hyperkalemia  Microcytic anemia  Prostate cancer  Ascending thoracic aortic  aneurysm  GERD  Hospital day # 1  He presented with left leg weakness secondary to by cerebral embolic infarcts with strong suspicion for paroxysmal A. fib.  Recommend aspirin 81 mg and Plavix 75 mg daily for 3 weeks followed by Plavix alone.  Continue ongoing stroke work-up and therapy consults. Discussed with Dr. Doristine Bosworth.I have spent a total of  35 minutes with the patient reviewing hospital notes,  test results, labs and examining the patient as well as establishing an assessment and plan that was discussed personally with the patient.  > 50% of time was spent in direct patient care.  Antony Contras, MD    To contact Stroke Continuity provider, please refer to [email or web address] After hours, contact General Neurology

## 2020-10-14 NOTE — Progress Notes (Signed)
Initial Nutrition Assessment  DOCUMENTATION CODES:   Not applicable  INTERVENTION:  Magic cup BID with meals, each supplement provides 290 kcal and 9 grams of protein  MVI daily  Continue Ensure Enlive po BID, each supplement provides 350 kcal and 20 grams of protein   NUTRITION DIAGNOSIS:   Inadequate oral intake related to poor appetite as evidenced by meal completion < 50%.    GOAL:   Patient will meet greater than or equal to 90% of their needs    MONITOR:   PO intake, Supplement acceptance, Labs, I & O's, Weight trends  REASON FOR ASSESSMENT:   Malnutrition Screening Tool    ASSESSMENT:   Pt admitted with ischemic stroke. PMH includes chronic hypoxemic respiratory failure 2/2 underlying COPD on 3L O2 at baseline, HTN, HLD, GERD, tobacco abuse, CAD, type 2 DM, PVD.   Pt unavailable at time of RD visit. Pt with orders for Ensure Enlive BID. Per RN, pt has received only one bottle this morning -- unsure of completion at this time. Will continue orders for Ensure and monitor tolerance. Will order additional supplements to provide additional kcals/protein given poor po intake (50% meal completion x 1 recorded meal).  No wt history available for review. Noted pt with +3 pitting edema to BLE and LUE, so wt reading likely inaccurate.   UOP: 3415ml x24 hours I/O: -3157ml since admit  Labs: Na 130 (L), Mg 1.5 (L), CBGs 206-304 (Diabetes Coordinator following) Medications: ss novolog, lokelma, IV Mg sulfate 2g x1  Diet Order:   Diet Order            Diet 2 gram sodium Room service appropriate? Yes; Fluid consistency: Thin  Diet effective now                 EDUCATION NEEDS:   No education needs have been identified at this time  Skin:  Skin Assessment: Reviewed RN Assessment  Last BM:  PTA  Height:   Ht Readings from Last 1 Encounters:  10/13/20 6' (1.829 m)    Weight:   Wt Readings from Last 1 Encounters:  10/13/20 84.4 kg    BMI:  Body mass  index is 25.24 kg/m.  Estimated Nutritional Needs:   Kcal:  2000-2200  Protein:  100-110 grams  Fluid:  >/=2L/d    Larkin Ina, MS, RD, LDN RD pager number and weekend/on-call pager number located in [place].

## 2020-10-14 NOTE — Evaluation (Signed)
Physical Therapy Evaluation Patient Details Name: Brandon [name] MRN: [id number] DOB: 09/02/1946 Today's Date: 10/14/2020   History of Present Illness  Patient is a 74 y/o male who presents with left sided weakness and SOB. 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  Also with COPD exacberation. PMH includes chronic hypoxemic respiratory failure secondary to underlying COPD-on 3 L of oxygen at home, HTN, HLD, tobacco abuse, CAD, DM, PVD  Clinical Impression  Patient presents with left sided weakness, incoordination, impaired sensation, impaired balance, dyspnea on exertion and impaired mobility s/p above. Pt lives alone and reports being independent for ADLs, IADLs and ambulation PTA. Drives and reports no falls. Today, pt tolerated Min-Mod A for standing from different surfaces and min A for balance/safety with use of RW. Difficult getting 02 reading due to poor wave form but likely dropped to 80s on 4L/min 02 [place]. Wears 02 at home. Would benefit from CIR to maximize independence and mobility prior to return home. Will follow acutely.    Follow Up Recommendations CIR;Supervision for mobility/OOB (pending improvement)    Equipment Recommendations  Other (comment) (TBA)    Recommendations for Other Services       Precautions / Restrictions Precautions Precautions: Fall;Other (comment) Precaution Comments: watch 02 Restrictions Weight Bearing Restrictions: No      Mobility  Bed Mobility Overal bed mobility: Needs Assistance Bed Mobility: Supine to Sit     Supine to sit: Supervision;HOB elevated     General bed mobility comments: Use of rail to assist. + dizziness which resolved.  Transfers Overall transfer level: Needs assistance Equipment used: None Transfers: Sit to/from Stand Sit to Stand: Min assist;Mod assist         General transfer comment: ASsist to power to standing with pt reaching for support once upright, +  dizziness. Stood from [employer], from toilet x1. more assist needed from low surface.  Ambulation/Gait Ambulation/Gait assistance: Min assist Gait Distance (Feet): 16 Feet (+ 25') Assistive device: None;Rolling walker (2 wheeled) Gait Pattern/deviations: Step-through pattern;Decreased stride length;Wide base of support Gait velocity: decreased Gait velocity interpretation: <1.31 ft/sec, indicative of household ambulator General Gait Details: Slow, unsteady and incoordinated gait BLEs with bil knee instability. 2/4 DOE. Sp02 difficult getting readying, likely dropping to 80s. Used RW back to bed, cues for RW proximity, management.  Stairs            Wheelchair Mobility    Modified Rankin (Stroke Patients Only) Modified Rankin (Stroke Patients Only) Pre-Morbid Rankin Score: Slight disability Modified Rankin: Moderately severe disability     Balance Overall balance assessment: Needs assistance Sitting-balance support: Feet supported;No upper extremity supported Sitting balance-Leahy Scale: Good     Standing balance support: During functional activity Standing balance-Leahy Scale: Poor Standing balance comment: Requires external support vs UE support for balance.                             Pertinent Vitals/Pain Pain Assessment: No/denies pain    Home Living Family/patient expects to be discharged to:: Private residence Living Arrangements: Alone   Type of Home: Other(Comment) (modular home) Home Access: Level entry     Home Layout: One level Home Equipment: Grab bars - tub/shower;None      Prior Function Level of Independence: Independent         Comments: Drives. No falls. Does not cook much. Wears 3.5 L 02 at home     Hand Dominance  Dominant Hand: Right    Extremity/Trunk Assessment   Upper Extremity Assessment Upper Extremity Assessment: Defer to OT evaluation;LUE deficits/detail LUE Deficits / Details: Dysmetria present FTN test. LUE  Coordination: decreased fine motor;decreased gross motor    Lower Extremity Assessment Lower Extremity Assessment: LLE deficits/detail LLE Deficits / Details: Grossly ~3+-4/5 throughout. Some decreased sensation in foot. LLE Sensation: decreased light touch LLE Coordination: decreased fine motor;decreased gross motor       Communication   Communication: HOH  Cognition Arousal/Alertness: Awake/alert Behavior During Therapy: WFL for tasks assessed/performed Overall Cognitive Status: Within Functional Limits for tasks assessed                                 General Comments: for basic mobility tasks, needs further assessment.      General Comments General comments (skin integrity, edema, etc.): Pt reports decreased swelling in BLEs and feet from admission. Good BP response to exercise.    Exercises     Assessment/Plan    PT Assessment Patient needs continued PT services  PT Problem List Decreased strength;Decreased mobility;Impaired sensation;Decreased balance;Decreased knowledge of use of DME;Decreased activity tolerance;Cardiopulmonary status limiting activity       PT Treatment Interventions Therapeutic activities;Gait training;Therapeutic exercise;Patient/family education;DME instruction;Balance training;Functional mobility training;Neuromuscular re-education    PT Goals (Current goals can be found in the Care Plan section)  Acute Rehab PT Goals Patient Stated Goal: to return home and be independent PT Goal Formulation: With patient Time For Goal Achievement: 10/28/20 Potential to Achieve Goals: Good    Frequency Min 3X/week   Barriers to discharge Decreased caregiver support lives alone    Co-evaluation               AM-PAC PT "6 Clicks" Mobility  Outcome Measure Help needed turning from your back to your side while in a flat bed without using bedrails?: None Help needed moving from lying on your back to sitting on the side of a flat bed  without using bedrails?: A Little Help needed moving to and from a bed to a chair (including a wheelchair)?: A Little Help needed standing up from a chair using your arms (e.g., wheelchair or bedside chair)?: A Lot Help needed to walk in hospital room?: A Little Help needed climbing 3-5 steps with a railing? : A Lot 6 Click Score: 17    End of Session Equipment Utilized During Treatment: Gait belt;Oxygen Activity Tolerance: Treatment limited secondary to medical complications (Comment) (drop in SP02) Patient left: in bed;with call bell/phone within reach;with nursing/sitter in room Nurse Communication: Mobility status PT Visit Diagnosis: Difficulty in walking, not elsewhere classified (R26.2);Unsteadiness on feet (R26.81);Hemiplegia and hemiparesis Hemiplegia - Right/Left: Left Hemiplegia - dominant/non-dominant: Non-dominant Hemiplegia - caused by: Cerebral infarction    Time: 0920-1000 PT Time Calculation (min) (ACUTE ONLY): 40 min   Charges:   PT Evaluation $PT Eval Moderate Complexity: 1 Mod PT Treatments $Gait Training: 8-22 mins $Therapeutic Activity: 8-22 mins        Marisa Severin, PT, DPT Acute Rehabilitation Services Pager [phone] Office [phone]      Brandon [name] 10/14/2020, 10:29 AM

## 2020-10-14 NOTE — Progress Notes (Signed)
PROGRESS NOTE    [name]  PPJ:[id number] DOB: 1946-08-26 DOA: 10/13/2020 PCP: Pcp, No   Brief Narrative:  HPI: [name] is a 74 y.o. male with medical history significant of chronic hypoxemic respiratory failure secondary to underlying COPD-on 3 L of oxygen at home, hypertension, hyperlipidemia, GERD, tobacco abuse, coronary artery disease, type 2 diabetes mellitus, PVD presents to emergency department with left leg weakness and shortness of breath since yesterday.  Patient tells me that yesterday he noticed left leg weakness and worsening shortness of breath.  EMS was called and was noted that his oxygen saturation was 86% on 3 L by nasal cannula.  They placed patient on nonrebreather with 10 L supplemental oxygen and his sats improved to 99%.  He reports wheezing, leg swelling, shortness of breath even at rest, orthopnea and unable to stand up due to left leg weakness.  Denies headache, blurry vision, slurred speech, facial droop, head trauma, loss of consciousness, chest pain, fever, chills, worsening cough, palpitation, nausea, vomiting, abdominal pain, urinary symptoms.  His last bowel movement was 5 days ago.  He continues to smoke 1 pack of cigarettes per day, denies alcohol, illicit drug use.  He lives alone at home.  He is not vaccinated against COVID-19.  ED Course: Upon arrival to ED: Patient tachypneic, hypoxic requiring 4 L of oxygen via nasal cannula, afebrile with no leukocytosis, BMP shows sodium of 130, potassium of 5.4, CO2 of 19, CBC shows microcytic anemia, troponin: Negative, COVID-19 negative, BNP: 178, UA negative for infection, patient was found to be hypoglycemic upon arrival with blood glucose of 53.  Received dextrose 50 and his blood sugar improved to 68 and then 144.  Patient received DuoNeb's, albuterol breathing treatment, Solu-Medrol loading dose, Rocephin and azithromycin in ED.  CT head was obtained which was concerning for ischemic stroke.  Neurology  consulted.  [facility] hospitalist consulted for admission for full stroke work-up and COPD exacerbation.  Assessment & Plan:   Principal Problem:   Ischemic stroke [facility]) Active Problems:   Essential hypertension   Type 2 diabetes mellitus (HCC)   CAD (coronary artery disease)   GERD (gastroesophageal reflux disease)   Acute on chronic respiratory failure with hypoxemia (HCC)   COPD (chronic obstructive pulmonary disease) (HCC)   Tobacco abuse   Hyponatremia   Hyperkalemia  Acute ischemic stroke: -Patient presented with left leg weakness started yesterday. CT head shows small right corona radiata acute/subacute stroke.  MRI brain shows multiple small acute infarcts within both hemispheres, possibly embolic.  Patient was on aspirin.  Plavix has been added.  He was also on Pletal.  Neurology managing this.  Patient seen by PT OT.  They recommended CIR.  Acute on chronic hypoxemic respiratory failure secondary to COPD exacerbation versus acute CHF of unknown type: Patient uses 3 L of nasal oxygen at baseline.  He was requiring more than 3 upon admission.  Today he feels better.  Denied any shortness of breath.  He was on 5 L when I was in his room and was saturating 98%.  I brought him down to 3 L.  He was saturating around 92%.  On exam he has no wheezes or crackles.  I do not think he has any COPD exacerbation at this point in time.  Chest x-ray yesterday showed vascular congestion.  He received Lasix 40 mg x 1 yesterday.  I will repeat 1 dose today.  Transthoracic echo shows normal ejection fraction.  His diagnosis really is acute diastolic congestive  heart failure.  He has had good diuresis and his total net negative more than 3 L today.  Monitor strict I's and O's and daily weight.  I will resume his home inhalers for COPD.  Essential  hypertension: Stable -Hold home BP meds-amlodipine, enalapril, metoprolol, Aldactone to allow permissive hypertension.  Monitor blood pressure  closely  Hyperkalemia: Resolved  Iron deficiency anemia: Stable.  Watch daily.  Will initiate on iron pills at the time of discharge.  Hyperlipidemia: Continue atorvastatin.  PVD: Continue aspirin, statin, cilostazol  Type 2 diabetes mellitus: Hemoglobin A1c is still pending. Uses Metformin and 70/30, 45 units twice daily.  He is hyperglycemic here.  Will resume 70/30 at 30 units twice daily.  Continue SSI.  Ascending thoracic aortic aneurysm: Noted on CTA chest. -Outpatient follow-up recommended.  GERD: Continue PPI  Tobacco abuse: Counseled about cessation provided again today.  DVT prophylaxis: SCD's Start: 10/13/20 1553   Code Status: Full Code  Family Communication:  None present at bedside.  Plan of care discussed with patient in length and he verbalized understanding and agreed with it.  Status is: Inpatient  Remains inpatient appropriate because:Inpatient level of care appropriate due to severity of illness   Dispo: The patient is from: Home              Anticipated d/c is to: CIR              Anticipated d/c date is: 1 day              Patient currently is not medically stable to d/c.        Estimated body mass index is 25.24 kg/m as calculated from the following:   Height as of this encounter: 6' (1.829 m).   Weight as of this encounter: 84.4 kg.      Nutritional status:  Nutrition Problem: Inadequate oral intake Etiology: poor appetite   Signs/Symptoms: meal completion < 50%   Interventions: Ensure Enlive (each supplement provides 350kcal and 20 grams of protein), MVI, Magic cup    Consultants:   Neurology  Procedures:   None  Antimicrobials:  Anti-infectives (From admission, onward)   Start     Dose/Rate Route Frequency Ordered Stop   10/14/20 1600  azithromycin (ZITHROMAX) tablet 500 mg        500 mg Oral Daily 10/13/20 1809     10/13/20 1345  cefTRIAXone (ROCEPHIN) 1 g in sodium chloride 0.9 % 100 mL IVPB        1 g 200  mL/hr over 30 Minutes Intravenous  Once 10/13/20 1343 10/13/20 1605   10/13/20 1345  azithromycin (ZITHROMAX) 500 mg in sodium chloride 0.9 % 250 mL IVPB        500 mg 250 mL/hr over 60 Minutes Intravenous  Once 10/13/20 1343 10/13/20 1649         Subjective: Seen and examined.  Feels much better.  Had no complaint at all.  Objective: Vitals:   10/14/20 0323 10/14/20 0442 10/14/20 0754 10/14/20 1140  BP:  105/66 115/67 (!) 111/58  Pulse: 87 87 92   Resp: 18 14 19 18   Temp:  97.9 F (36.6 C) 97.8 F (36.6 C) (!) 97.5 F (36.4 C)  TempSrc:  Oral Oral Oral  SpO2: 100% 98% 100%   Weight:      Height:        Intake/Output Summary (Last 24 hours) at 10/14/2020 1404 Last data filed at 10/14/2020 0836 Gross per 24 hour  Intake  240 ml  Output 3400 ml  Net -3160 ml   Filed Weights   10/13/20 2109  Weight: 84.4 kg    Examination:  General exam: Appears calm and comfortable  Respiratory system: Clear to auscultation. Respiratory effort normal. Cardiovascular system: S1 & S2 heard, RRR. No JVD, murmurs, rubs, gallops or clicks. No pedal edema. Gastrointestinal system: Abdomen is nondistended, soft and nontender. No organomegaly or masses felt. Normal bowel sounds heard. Central nervous system: Alert and oriented. No focal neurological deficits. Extremities: Symmetric 5 x 5 power. Skin: No rashes, lesions or ulcers Psychiatry: Judgement and insight appear normal. Mood & affect appropriate.    Data Reviewed: I have personally reviewed following labs and imaging studies  CBC: Recent Labs  Lab 10/13/20 1215 10/13/20 1332 10/14/20 0126  WBC 8.1  --  4.2  NEUTROABS 6.4  --   --   HGB 10.2* 12.2* 9.6*  HCT 34.4* 36.0* 30.7*  MCV 74.9*  --  71.6*  PLT 329  --  867   Basic Metabolic Panel: Recent Labs  Lab 10/13/20 1215 10/13/20 1332 10/14/20 0126  NA 130* 131* 130*  K 5.4* 5.1 4.2  CL 99  --  98  CO2 19*  --  22  GLUCOSE 95  --  267*  BUN 15  --  13   CREATININE 1.05  --  1.10  CALCIUM 9.1  --  8.8*  MG  --   --  1.5*   GFR: Estimated Creatinine Clearance: 63.7 mL/min (by C-G formula based on SCr of 1.1 mg/dL). Liver Function Tests: Recent Labs  Lab 10/13/20 1215  AST 17  ALT 15  ALKPHOS 60  BILITOT 0.7  PROT 6.6  ALBUMIN 3.4*   No results for input(s): LIPASE, AMYLASE in the last 168 hours. No results for input(s): AMMONIA in the last 168 hours. Coagulation Profile: No results for input(s): INR, PROTIME in the last 168 hours. Cardiac Enzymes: No results for input(s): CKTOTAL, CKMB, CKMBINDEX, TROPONINI in the last 168 hours. BNP (last 3 results) No results for input(s): PROBNP in the last 8760 hours. HbA1C: No results for input(s): HGBA1C in the last 72 hours. CBG: Recent Labs  Lab 10/13/20 1532 10/13/20 2251 10/14/20 0602 10/14/20 0759 10/14/20 1150  GLUCAP 144* 304* 228* 206* 190*   Lipid Profile: Recent Labs    10/14/20 0126  CHOL 92  HDL 41  LDLCALC 42  TRIG 45  CHOLHDL 2.2   Thyroid Function Tests: Recent Labs    10/14/20 0126  TSH 0.467   Anemia Panel: Recent Labs    10/14/20 0126  FERRITIN 19*  TIBC 350  IRON 19*   Sepsis Labs: No results for input(s): PROCALCITON, LATICACIDVEN in the last 168 hours.  Recent Results (from the past 240 hour(s))  Respiratory Panel by RT PCR (Flu A&B, Covid) - Nasopharyngeal Swab     Status: None   Collection Time: 10/13/20 12:22 PM   Specimen: Nasopharyngeal Swab  Result Value Ref Range Status   SARS Coronavirus 2 by RT PCR NEGATIVE NEGATIVE Final    Comment: (NOTE) SARS-CoV-2 target nucleic acids are NOT DETECTED.  The SARS-CoV-2 RNA is generally detectable in upper respiratoy specimens during the acute phase of infection. The lowest concentration of SARS-CoV-2 viral copies this assay can detect is 131 copies/mL. A negative result does not preclude SARS-Cov-2 infection and should not be used as the sole basis for treatment or other patient  management decisions. A negative result may occur with  improper  specimen collection/handling, submission of specimen other than nasopharyngeal swab, presence of viral mutation(s) within the areas targeted by this assay, and inadequate number of viral copies (<131 copies/mL). A negative result must be combined with clinical observations, patient history, and epidemiological information. The expected result is Negative.  Fact Sheet for Patients:  PinkCheek.be  Fact Sheet for Healthcare Providers:  GravelBags.it  This test is no t yet approved or cleared by the [place] FDA and  has been authorized for detection and/or diagnosis of SARS-CoV-2 by FDA under an Emergency Use Authorization (EUA). This EUA will remain  in effect (meaning this test can be used) for the duration of the COVID-19 declaration under Section 564(b)(1) of the Act, 21 U.S.C. section 360bbb-3(b)(1), unless the authorization is terminated or revoked sooner.     Influenza A by PCR NEGATIVE NEGATIVE Final   Influenza B by PCR NEGATIVE NEGATIVE Final    Comment: (NOTE) The Xpert Xpress SARS-CoV-2/FLU/RSV assay is intended as an aid in  the diagnosis of influenza from Nasopharyngeal swab specimens and  should not be used as a sole basis for treatment. Nasal washings and  aspirates are unacceptable for Xpert Xpress SARS-CoV-2/FLU/RSV  testing.  Fact Sheet for Patients: PinkCheek.be  Fact Sheet for Healthcare Providers: GravelBags.it  This test is not yet approved or cleared by the [place] FDA and  has been authorized for detection and/or diagnosis of SARS-CoV-2 by  FDA under an Emergency Use Authorization (EUA). This EUA will remain  in effect (meaning this test can be used) for the duration of the  Covid-19 declaration under Section 564(b)(1) of the Act, 21  U.S.C. section 360bbb-3(b)(1),  unless the authorization is  terminated or revoked. Performed at [facility], [place] [address] [place], [place] [address]       Radiology Studies: CT ANGIO HEAD W OR WO CONTRAST  Result Date: 10/13/2020 CLINICAL DATA:  Left-sided weakness EXAM: CT ANGIOGRAPHY HEAD AND NECK TECHNIQUE: Multidetector CT imaging of the head and neck was performed using the standard protocol during bolus administration of intravenous contrast. Multiplanar CT image reconstructions and MIPs were obtained to evaluate the vascular anatomy. Carotid stenosis measurements (when applicable) are obtained utilizing NASCET criteria, using the distal internal carotid diameter as the denominator. CONTRAST:  59mL OMNIPAQUE IOHEXOL 350 MG/ML SOLN COMPARISON:  CT head earlier same day FINDINGS: CT HEAD Brain: There is no acute intracranial hemorrhage, mass effect, or edema. Gray-white differentiation is preserved. There is no extra-axial fluid collection. Prominence of the ventricles and sulci reflecting stable parenchymal volume loss. Patchy hypoattenuation in the supratentorial white matter likely reflects stable chronic microvascular ischemic changes. As noted previously, a superimposed small vessel infarct of the right corona radiata is not excluded. Vascular: No hyperdense vessel. Skull: Calvarium is unremarkable. Sinuses/Orbits: No acute finding. Other: None. Review of the MIP images confirms the above findings CTA NECK Aortic arch: Calcified plaque along the arch and great vessel origins, which are patent. Right carotid system: Patent. Primarily calcified plaque along the proximal carotid causes less than 50% stenosis. Left carotid system: Patent. Primarily calcified plaque along the proximal internal carotid causes less than 50% stenosis. There is also calcified plaque along the distal internal carotid causing minimal stenosis. Vertebral arteries: Patent. Right vertebral artery is dominant. Multifocal calcified plaque along the  right vertebral artery causing up to moderate stenosis. Skeleton: Multilevel cervical spine degenerative changes. Other neck: No mass or adenopathy. Upper chest: Advanced emphysema.  Trace right pleural effusion. Review of the MIP  images confirms the above findings CTA HEAD Anterior circulation: Intracranial internal carotid arteries are patent with calcified plaque causing mild stenosis. Anterior cerebral arteries are patent. 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 Middle cerebral arteries are patent. Posterior circulation: Intracranial vertebral arteries are patent with calcified plaque causing mild stenosis. Basilar artery is patent. Posterior cerebral arteries are patent. Right larger than left posterior communicating arteries are present. Venous sinuses: Patent as allowed by contrast bolus timing. Review of the MIP images confirms the above findings IMPRESSION: No acute intracranial abnormality. No large vessel occlusion or hemodynamically significant stenosis. Electronically Signed   By: Macy Mis M.D.   On: 10/13/2020 17:00   CT Head Wo Contrast  Result Date: 10/13/2020 CLINICAL DATA:  Neuro deficit, left arm and leg weakness. EXAM: CT HEAD WITHOUT CONTRAST TECHNIQUE: Contiguous axial images were obtained from the base of the skull through the vertex without intravenous contrast. COMPARISON:  None. FINDINGS: Brain: No intracranial hemorrhage. Small right corona radiata hypodensity. No mass lesion. No midline shift, ventriculomegaly or extra-axial fluid collection. Mild diffuse cerebral atrophy with ex vacuo dilatation. Scattered and confluent periventricular and subcortical white matter hypodensities are nonspecific however commonly associated with chronic microvascular ischemic changes. Vascular: No hyperdense vessel or unexpected calcification. Bilateral skull base atherosclerotic calcifications. Skull: Negative for fracture or focal lesion. Sinuses/Orbits: Normal orbits. Sequela chronic of  maxillary sinus disease with moderate mucosal thickening and layering secretions. Mild right maxillary sinus mucosal thickening. Right mastoid effusion with dehiscence of the bony posterior external auditory canal wall. Other: None. IMPRESSION: Small right corona radiata hypodensity may reflect acute/subacute insult versus chronic microvascular ischemic changes. Consider MRI if suspicion persists. Mild cerebral atrophy and chronic microvascular ischemic changes. Right mastoid effusion with dehiscence of the bony external auditory canal. Further evaluation with outpatient CT temporal bone is recommended. Electronically Signed   By: Primitivo Gauze M.D.   On: 10/13/2020 14:11   CT ANGIO NECK W OR WO CONTRAST  Result Date: 10/13/2020 CLINICAL DATA:  Left-sided weakness EXAM: CT ANGIOGRAPHY HEAD AND NECK TECHNIQUE: Multidetector CT imaging of the head and neck was performed using the standard protocol during bolus administration of intravenous contrast. Multiplanar CT image reconstructions and MIPs were obtained to evaluate the vascular anatomy. Carotid stenosis measurements (when applicable) are obtained utilizing NASCET criteria, using the distal internal carotid diameter as the denominator. CONTRAST:  70mL OMNIPAQUE IOHEXOL 350 MG/ML SOLN COMPARISON:  CT head earlier same day FINDINGS: CT HEAD Brain: There is no acute intracranial hemorrhage, mass effect, or edema. Gray-white differentiation is preserved. There is no extra-axial fluid collection. Prominence of the ventricles and sulci reflecting stable parenchymal volume loss. Patchy hypoattenuation in the supratentorial white matter likely reflects stable chronic microvascular ischemic changes. As noted previously, a superimposed small vessel infarct of the right corona radiata is not excluded. Vascular: No hyperdense vessel. Skull: Calvarium is unremarkable. Sinuses/Orbits: No acute finding. Other: None. Review of the MIP images confirms the above findings  CTA NECK Aortic arch: Calcified plaque along the arch and great vessel origins, which are patent. Right carotid system: Patent. Primarily calcified plaque along the proximal carotid causes less than 50% stenosis. Left carotid system: Patent. Primarily calcified plaque along the proximal internal carotid causes less than 50% stenosis. There is also calcified plaque along the distal internal carotid causing minimal stenosis. Vertebral arteries: Patent. Right vertebral artery is dominant. Multifocal calcified plaque along the right vertebral artery causing up to moderate stenosis. Skeleton: Multilevel cervical spine degenerative changes. Other neck:  No mass or adenopathy. Upper chest: Advanced emphysema.  Trace right pleural effusion. Review of the MIP images confirms the above findings CTA HEAD Anterior circulation: Intracranial internal carotid arteries are patent with calcified plaque causing mild stenosis. Anterior cerebral arteries are patent. 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 Middle cerebral arteries are patent. Posterior circulation: Intracranial vertebral arteries are patent with calcified plaque causing mild stenosis. Basilar artery is patent. Posterior cerebral arteries are patent. Right larger than left posterior communicating arteries are present. Venous sinuses: Patent as allowed by contrast bolus timing. Review of the MIP images confirms the above findings IMPRESSION: No acute intracranial abnormality. No large vessel occlusion or hemodynamically significant stenosis. Electronically Signed   By: Macy Mis M.D.   On: 10/13/2020 17:00   CT Chest Wo Contrast  Result Date: 10/13/2020 CLINICAL DATA:  Shortness of breath in a patient with a history of COPD. EXAM: CT CHEST WITHOUT CONTRAST TECHNIQUE: Multidetector CT imaging of the chest was performed following the standard protocol without IV contrast. COMPARISON:  Single-view of the chest 10/13/2020 and 07/21/2008. FINDINGS:  Cardiovascular: The patient has extensive calcific aortic and coronary atherosclerosis. The ascending aorta measures 4.8 cm transverse at the level of the pulmonary outflow tract. Heart size is upper normal. No pericardial effusion Mediastinum/Nodes: No enlarged mediastinal or axillary lymph nodes. Thyroid gland, trachea, and esophagus demonstrate no significant findings. Lungs/Pleura: There are small bilateral pleural effusions. The patient has severe central lobular and paraseptal emphysema. There is mild basilar atelectasis bilaterally. Upper Abdomen: No acute abnormality. Three cysts are seen in the liver. The largest is in the right lobe and measures 6.2 cm. Musculoskeletal: No acute or focal abnormality. Multilevel cyst thoracic spondylosis is noted. IMPRESSION: Small bilateral pleural effusions and dependent basilar atelectasis. 4.8 cm ascending thoracic aortic aneurysm. Recommend semi-annual imaging followup by CTA or MRA and referral to cardiothoracic surgery if not already obtained. This recommendation follows 2010 ACCF/AHA/AATS/ACR/ASA/SCA/SCAI/SIR/STS/SVM Guidelines for the Diagnosis and Management of Patients With Thoracic Aortic Disease. Circulation. 2010; 121: X106-Y694. Aortic atherosclerosis (ICD10-I70.0) and severe emphysema (ICD10-J43.9). Extensive calcific coronary atherosclerosis also noted. Electronically Signed   By: Inge Rise M.D.   On: 10/13/2020 14:11   MR BRAIN WO CONTRAST  Result Date: 10/13/2020 CLINICAL DATA:  Acute neurologic deficit EXAM: MRI HEAD WITHOUT CONTRAST TECHNIQUE: Multiplanar, multiecho pulse sequences of the brain and surrounding structures were obtained without intravenous contrast. COMPARISON:  10/13/2020 CTA head FINDINGS: Brain: Multiple small acute infarcts within both hemispheres, including the right precentral gyrus, left middle frontal gyrus and paramedian left parietal lobe. No acute hemorrhage. Early confluent hyperintense T2-weighted signal of the  periventricular and deep white matter. There is generalized atrophy without lobar predilection. No chronic microhemorrhage. Normal midline structures. Choose 1 Vascular: Normal flow voids. Skull and upper cervical spine: Normal marrow signal. Sinuses/Orbits: Right mastoid effusion and right ocular lens replacement. Other: None IMPRESSION: 1. Multiple small acute infarcts within both hemispheres, possibly of an embolic etiology. 2. Generalized atrophy and findings of chronic microvascular disease. Electronically Signed   By: Ulyses Jarred M.D.   On: 10/13/2020 19:47   DG Chest Port 1 View  Result Date: 10/13/2020 CLINICAL DATA:  Shortness of breath. Per chart review, patient has bilateral lower lobe rales and bilateral lower extremity edema. EXAM: PORTABLE CHEST 1 VIEW COMPARISON:  07/21/2008 FINDINGS: There is diffuse interstitial prominence, suggestive of interstitial pulmonary edema. No confluent consolidation. No visible pleural effusions or pneumothorax on this limited AP portable radiograph. Mild enlargement the cardiac  silhouette. Tortuous aorta. Aortic atherosclerosis. IMPRESSION: 1. Diffuse interstitial prominence, suggestive of interstitial pulmonary edema. Atypical infection could have a similar appearance. 2. Mild cardiomegaly. Electronically Signed   By: Margaretha Sheffield MD   On: 10/13/2020 13:17   ECHOCARDIOGRAM COMPLETE  Result Date: 10/14/2020    ECHOCARDIOGRAM REPORT   Patient Name:   BONIFACE GOFFE Date of Exam: 10/14/2020 Medical Rec #:  [id number]     Height:       72.0 in Accession #:    [id number]    Weight:       186.1 lb Date of Birth:  16-Nov-1946     BSA:          2.066 m Patient Age:    24 years      BP:           90/62 mmHg Patient Gender: M             HR:           86 bpm. Exam Location:  Inpatient Procedure: 2D Echo, Color Doppler and Cardiac Doppler                                 MODIFIED REPORT: This report was modified by Cherlynn Kaiser MD on 10/14/2020 due to non-clinical                                     revision.  Indications:     Stroke 434.91 / I163.9  History:         Patient has no prior history of Echocardiogram examinations.                  CAD; Risk Factors:Hypertension, Diabetes and GERD. Abdominal                  Aortic Aneurysm.  Sonographer:     Tiffany Dance Referring Phys:  [id number] Mckinley Jewel Diagnosing Phys: Cherlynn Kaiser MD IMPRESSIONS  1. Left ventricular ejection fraction, by estimation, is 60 to 65%. The left ventricle has normal function. The left ventricle has no regional wall motion abnormalities. There is moderate left ventricular hypertrophy. Indeterminate diastolic filling due  to E-A fusion.  2. Right ventricular systolic function is normal. The right ventricular size is mildly enlarged. Tricuspid regurgitation signal is inadequate for assessing PA pressure.  3. The mitral valve is degenerative. Mild mitral valve regurgitation. No evidence of mitral stenosis.  4. Prominent LVOT calcifications extending along base of anterior mitral leaflet.. The aortic valve is abnormal. There is moderate calcification of the aortic valve. Aortic valve regurgitation is mild to moderate. No aortic stenosis is present.  5. Aortic dilatation noted. There is mild dilatation of the aortic root, measuring 40 mm. There is moderate dilatation of the ascending aorta, measuring 48 mm.  6. The inferior vena cava is dilated in size with >50% respiratory variability, suggesting right atrial pressure of 8 mmHg.  7. Cannot exclude small PFO by color flow. Atrial septal aneurysm. Conclusion(s)/Recommendation(s): Bulky LVOT calcifications may be a cardiac source of stroke. FINDINGS  Left Ventricle: Left ventricular ejection fraction, by estimation, is 60 to 65%. The left ventricle has normal function. The left ventricle has no regional wall motion abnormalities. The left ventricular internal cavity size was normal in size. There is  moderate left ventricular hypertrophy.  Indeterminate diastolic  filling due to E-A fusion. Right Ventricle: The right ventricular size is mildly enlarged. No increase in right ventricular wall thickness. Right ventricular systolic function is normal. Tricuspid regurgitation signal is inadequate for assessing PA pressure. Left Atrium: Left atrial size was normal in size. Right Atrium: Right atrial size was normal in size. Pericardium: There is no evidence of pericardial effusion. Mitral Valve: The mitral valve is degenerative in appearance. There is mild thickening of the mitral valve leaflet(s). There is moderate calcification of the mitral valve leaflet(s). Mild mitral valve regurgitation. No evidence of mitral valve stenosis. Tricuspid Valve: The tricuspid valve is normal in structure. Tricuspid valve regurgitation is trivial. No evidence of tricuspid stenosis. Aortic Valve: Prominent LVOT calcifications extending along base of anterior mitral leaflet. The aortic valve is abnormal. There is moderate calcification of the aortic valve. Aortic valve regurgitation is mild to moderate. No aortic stenosis is present. Pulmonic Valve: The pulmonic valve was grossly normal. Pulmonic valve regurgitation is trivial. No evidence of pulmonic stenosis. Aorta: Aortic dilatation noted. There is mild dilatation of the aortic root, measuring 40 mm. There is moderate dilatation of the ascending aorta, measuring 48 mm. Venous: The inferior vena cava is dilated in size with greater than 50% respiratory variability, suggesting right atrial pressure of 8 mmHg. IAS/Shunts: The interatrial septum is aneurysmal. Cannot exclude small PFO by color flow. Atrial septal aneurysm. Additional Comments: A venous catheter is visualized in the right atrium and right ventricle.  LEFT VENTRICLE PLAX 2D LVIDd:         4.30 cm  Diastology LVIDs:         2.80 cm  LV e' medial:    5.33 cm/s LV PW:         1.50 cm  LV E/e' medial:  14.6 LV IVS:        1.40 cm  LV e' lateral:   6.31 cm/s LVOT  diam:     2.20 cm  LV E/e' lateral: 12.3 LV SV:         70 LV SV Index:   34 LVOT Area:     3.80 cm  RIGHT VENTRICLE             IVC RV Basal diam:  3.10 cm     IVC diam: 2.30 cm RV Mid diam:    2.60 cm RV S prime:     11.00 cm/s TAPSE (M-mode): 1.7 cm LEFT ATRIUM             Index       RIGHT ATRIUM           Index LA diam:        4.30 cm 2.08 cm/m  RA Area:     19.50 cm LA Vol (A2C):   64.7 ml 31.31 ml/m RA Volume:   58.10 ml  28.12 ml/m LA Vol (A4C):   57.7 ml 27.93 ml/m LA Biplane Vol: 62.8 ml 30.39 ml/m  AORTIC VALVE LVOT Vmax:   89.20 cm/s LVOT Vmean:  58.400 cm/s LVOT VTI:    0.184 m  AORTA Ao Root diam: 4.00 cm Ao Asc diam:  4.80 cm MITRAL VALVE MV Area (PHT): 3.99 cm    SHUNTS MV Decel Time: 190 msec    Systemic VTI:  0.18 m MV E velocity: 77.60 cm/s  Systemic Diam: 2.20 cm MV A velocity: 91.70 cm/s MV E/A ratio:  0.85 Cherlynn Kaiser MD Electronically signed by Cherlynn Kaiser MD Signature Date/Time: 10/14/2020/12:22:32 PM  Final (Updated)     Scheduled Meds: .  stroke: mapping our early stages of recovery book   Does not apply Once  . [START ON 10/15/2020] aspirin EC  81 mg Oral Daily  . atorvastatin  40 mg Oral QHS  . azithromycin  500 mg Oral Daily  . cilostazol  50 mg Oral BID  . clopidogrel  75 mg Oral Daily  . dextrose  1 ampule Intravenous Once  . feeding supplement  237 mL Oral BID BM  . furosemide  40 mg Intravenous Once  . influenza vaccine adjuvanted  0.5 mL Intramuscular Tomorrow-1000  . insulin aspart  0-15 Units Subcutaneous TID WC  . insulin aspart  0-5 Units Subcutaneous QHS  . insulin aspart protamine - aspart  30 Units Subcutaneous BID WC  . mometasone-formoterol  2 puff Inhalation BID  . multivitamin with minerals  1 tablet Oral Daily  . nortriptyline  50 mg Oral QHS  . oxybutynin  10 mg Oral Daily  . pantoprazole  40 mg Oral Daily  . sodium zirconium cyclosilicate  10 g Oral Once  . Tiotropium Bromide Monohydrate  2 puff Inhalation Daily   Continuous  Infusions:   LOS: 1 day   Time spent: 38 minutes   Darliss Cheney, MD [facility]  10/14/2020, 2:04 PM   To contact the attending provider between 7A-7P or the covering provider during after hours 7P-7A, please log into the web site [email or web address].

## 2020-10-14 NOTE — Evaluation (Addendum)
Speech Language Pathology Evaluation Patient Details Name: Brandon [name] MRN: [id number] DOB: 1946/10/26 Today's Date: 10/14/2020 Time: 5956-3875 SLP Time Calculation (min) (ACUTE ONLY): 30 min  Problem List:  Patient Active Problem List   Diagnosis Date Noted  . Essential hypertension   . Type 2 diabetes mellitus ([place])   . CAD (coronary artery disease)   . GERD (gastroesophageal reflux disease)   . Acute on chronic respiratory failure with hypoxemia ([place])   . COPD (chronic obstructive pulmonary disease) ([place])   . Tobacco abuse   . Hyponatremia   . Hyperkalemia   . Ischemic stroke [facility])    Past Medical History:  Past Medical History:  Diagnosis Date  . Abdominal aortic aneurysm without rupture ([place])   . CAD (coronary artery disease)    severe on chest CT   . Chronic alcoholism ([place])   . Chronic idiopathic constipation   . Chronic pancreatitis ([place])   . Chronic respiratory failure with hypoxia ([place])   . Essential hypertension   . GERD (gastroesophageal reflux disease)   . Hyperaldosteronism ([place])   . Malignant neoplasm of prostate ([place])   . Nicotine dependence   . Paroxysmal atrial tachycardia ([place])   . Peripheral vascular disease ([place])   . Serrated polyp of colon   . Type 2 diabetes mellitus ([place])    Past Surgical History:  Past Surgical History:  Procedure Laterality Date  . PROSTATECTOMY  12/2012   HPI:  74 y.o. male with medical history significant of chronic hypoxemic respiratory failure secondary to underlying COPD-on 3 L of oxygen at home, hypertension, hyperlipidemia, GERD, tobacco abuse, coronary artery disease, type 2 diabetes mellitus, PVD presents to emergency department with left leg weakness and shortness of breath since yesterday 10/13/20 MRI revealed: Multiple small acute infarcts within both hemispheres, possibly of an embolic etiology  Assessment / Plan / Recommendation Clinical Impression  Pt administered the SLUMS ([place] Mental  Status Examination) with a normal score obtained of 29/30 with one word unable to be recalled without a cue after a time delay.  Pt cooperative throughout SLE; oriented x4, cognition appears [facility] for reasoning, problem solving, attention, and executive functioning.  Pt able to communicate verbally within complex conversation re: deficits, past medical history and personal information.  He was 100% intelligible within complex conversation as well.  No c/o swallowing difficulty despite COPD dx; passed Yale swallow screening.  No needs identified for ST at this time.  Will s/o for speech services within acute setting.  Thank you for this consult.    SLP Assessment  SLP Recommendation/Assessment: Patient does not need any further Speech Language Pathology Services SLP Visit Diagnosis: Cognitive communication deficit (R41.841)    Follow Up Recommendations  None    Frequency and Duration   Evaluation only        SLP Evaluation Cognition  Overall Cognitive Status: Within Functional Limits for tasks assessed Arousal/Alertness: Awake/alert Orientation Level: Oriented X4 Attention: Sustained Sustained Attention: Appears intact Memory: Appears intact Memory Recall Sock: Without Cue Memory Recall Blue: Without Cue Memory Recall Bed: Without Cue Awareness: Appears intact Problem Solving: Appears intact Safety/Judgment: Appears intact       Comprehension  Auditory Comprehension Overall Auditory Comprehension: Appears within functional limits for tasks assessed Yes/No Questions: Within Functional Limits Commands: Within Functional Limits Conversation: Complex Visual Recognition/Discrimination Discrimination: Within Function Limits Reading Comprehension Reading Status: Within funtional limits    Expression Expression Primary Mode of Expression: Verbal Verbal Expression Overall Verbal Expression: Appears within functional limits  for tasks assessed Initiation: No impairment Level of  Generative/Spontaneous Verbalization: Conversation Repetition: No impairment Naming: No impairment Pragmatics: No impairment Non-Verbal Means of Communication: Not applicable Written Expression Dominant Hand: Right Written Expression: Within Functional Limits   Oral / Motor  Oral Motor/Sensory Function Overall Oral Motor/Sensory Function: Within functional limits Motor Speech Overall Motor Speech: Appears within functional limits for tasks assessed Respiration: Within functional limits Phonation: Normal Resonance: Within functional limits Articulation: Within functional limitis Intelligibility: Intelligible Motor Planning: Witnin functional limits Motor Speech Errors: Not applicable                      Elvina Sidle, M.S., CCC-SLP 10/14/2020, 11:01 AM

## 2020-10-14 NOTE — Progress Notes (Signed)
Inpatient Rehab Admissions Coordinator Note:   Per PT recommendation, pt was screened for CIR candidacy by Gayland Curry, MS, CCC-SLP.  At this time we are recommending an inpatient rehab consult.  AC will place consult order per protocol.  Please contact me with questions.    Gayland Curry, [place], [place] Admissions Coordinator [phone] 10/14/20 3:42 PM

## 2020-10-14 NOTE — Progress Notes (Signed)
OT Cancellation Note  Patient Details Name: Robben Jagiello MRN: [id number] DOB: 03-Sep-1946      10/14/20 1100  OT Visit Information  Reason Eval/Treat Not Completed Patient at procedure or test/ unavailable (working with [place]. Will follow up later time)    [facility] 10/14/2020, 3:01 PM  Maurie Boettcher, OT/L   Acute OT Clinical Specialist Acute Rehabilitation Services Pager [phone] Office [phone]

## 2020-10-14 NOTE — Progress Notes (Signed)
  Echocardiogram 2D Echocardiogram has been performed.  Brandon [name] 10/14/2020, 9:28 AM

## 2020-10-15 DIAGNOSIS — I10 Essential (primary) hypertension: Secondary | ICD-10-CM | POA: Diagnosis not present

## 2020-10-15 DIAGNOSIS — I639 Cerebral infarction, unspecified: Secondary | ICD-10-CM | POA: Diagnosis not present

## 2020-10-15 LAB — GLUCOSE, CAPILLARY
Glucose-Capillary: 144 mg/dL — ABNORMAL HIGH (ref 70–99)
Glucose-Capillary: 213 mg/dL — ABNORMAL HIGH (ref 70–99)
Glucose-Capillary: 244 mg/dL — ABNORMAL HIGH (ref 70–99)
Glucose-Capillary: 228 mg/dL — ABNORMAL HIGH (ref 70–99)

## 2020-10-15 LAB — CBC
HCT: 33.8 % — ABNORMAL LOW (ref 39.0–52.0)
Hemoglobin: 10.1 g/dL — ABNORMAL LOW (ref 13.0–17.0)
MCH: 21.7 pg — ABNORMAL LOW (ref 26.0–34.0)
MCHC: 29.9 g/dL — ABNORMAL LOW (ref 30.0–36.0)
MCV: 72.7 fL — ABNORMAL LOW (ref 80.0–100.0)
Platelets: 344 10*3/uL (ref 150–400)
RBC: 4.65 MIL/uL (ref 4.22–5.81)
RDW: 21.9 % — ABNORMAL HIGH (ref 11.5–15.5)
WBC: 10 10*3/uL (ref 4.0–10.5)
nRBC: 0.2 % (ref 0.0–0.2)

## 2020-10-15 LAB — HEMOGLOBIN A1C
Hgb A1c MFr Bld: 6.1 % — ABNORMAL HIGH (ref 4.8–5.6)
Mean Plasma Glucose: 128 mg/dL

## 2020-10-15 MED ORDER — FUROSEMIDE 10 MG/ML IJ SOLN
40.0000 mg | Freq: Once | INTRAMUSCULAR | Status: AC
  Administered 2020-10-15: 40 mg via INTRAVENOUS
  Filled 2020-10-15: qty 4

## 2020-10-15 MED ORDER — INSULIN ASPART PROT & ASPART (70-30 MIX) 100 UNIT/ML ~~LOC~~ SUSP
40.0000 [IU] | Freq: Two times a day (BID) | SUBCUTANEOUS | Status: DC
  Administered 2020-10-15 – 2020-10-21 (×13): 40 [IU] via SUBCUTANEOUS
  Filled 2020-10-15 (×2): qty 10

## 2020-10-15 MED ORDER — METOPROLOL SUCCINATE ER 50 MG PO TB24
150.0000 mg | ORAL_TABLET | ORAL | Status: DC
  Administered 2020-10-15 – 2020-10-21 (×12): 150 mg via ORAL
  Filled 2020-10-15 (×12): qty 1

## 2020-10-15 NOTE — Progress Notes (Signed)
STROKE TEAM PROGRESS NOTE   INTERVAL HISTORY RN at bedside. Pt lethargic lying in bed, but neuro intact. LLE weakness resolved. Still has sinus tachy on the tele with HR 120s. Per RN, when he sits up, HR goes to 130s. Occasional PACs.   Vitals:   10/15/20 0810 10/15/20 0814 10/15/20 1117 10/15/20 1143  BP:   124/79   Pulse:      Resp:   (!) 21 20  Temp:   98 F (36.7 C)   TempSrc:   Oral   SpO2: 100% 100% 96%   Weight:      Height:       CBC:  Recent Labs  Lab 10/13/20 1215 10/13/20 1215 10/13/20 1332 10/14/20 0126  WBC 8.1  --   --  4.2  NEUTROABS 6.4  --   --   --   HGB 10.2*   < > 12.2* 9.6*  HCT 34.4*   < > 36.0* 30.7*  MCV 74.9*  --   --  71.6*  PLT 329  --   --  326   < > = values in this interval not displayed.   Basic Metabolic Panel:  Recent Labs  Lab 10/13/20 1215 10/13/20 1215 10/13/20 1332 10/14/20 0126  NA 130*   < > 131* 130*  K 5.4*   < > 5.1 4.2  CL 99  --   --  98  CO2 19*  --   --  22  GLUCOSE 95  --   --  267*  BUN 15  --   --  13  CREATININE 1.05  --   --  1.10  CALCIUM 9.1  --   --  8.8*  MG  --   --   --  1.5*   < > = values in this interval not displayed.   Lipid Panel:  Recent Labs  Lab 10/14/20 0126  CHOL 92  TRIG 45  HDL 41  CHOLHDL 2.2  VLDL 9  LDLCALC 42   HgbA1c:  Recent Labs  Lab 10/14/20 0126  HGBA1C 6.1*   Urine Drug Screen: No results for input(s): LABOPIA, COCAINSCRNUR, LABBENZ, AMPHETMU, THCU, LABBARB in the last 168 hours.  Alcohol Level No results for input(s): ETH in the last 168 hours.  IMAGING past 24 hours No results found.  PHYSICAL EXAM Pleasant elderly male not in distress.  He is on nasal cannula oxygen. . Afebrile. Head is nontraumatic. Neck is supple without bruit.  Cardiac exam no murmur or gallop but tachycardia with occasional PACs. Lungs are clear to auscultation. Distal pulses are well felt.  Neurological Exam : Awake alert oriented to time place and person.  Speech and language appear  normal.  Extraocular movements are full range without nystagmus.  Visual field full.  Face is symmetric without weakness.  Tongue midline.  Motor system exam shows BUEs 5-/5 and BLEs 4/5.  Sensation is symmetrical.  FTN bilaterally intact. Gait not tested.    ASSESSMENT/PLAN [name] is a 74 y.o. male with history of type 2 diabetes, peripheral vascular disease, paroxysmal atrial tachycardia, essential hypertension, tobacco abuse and chronic alcoholism presenting with SOB, sudden onset non-dermatomal LLE weakness w/ B pedal edema and stock distribution sensory deficit.   Stroke:  B/l ACAs, left MCA/ACA and MCA/PCA punctate infarcts, embolic secondary to unknown source, concerning for occult PAF  CT head small R corona radiata hypodensity. Mild Small vessel disease. Mild Atrophy. R mastoid effusion w/ dehiscence  CTA head & neck  Unremarkable   MRI  multiple small B infarct as above. Small vessel disease. Atrophy.   2D Echo EF 60-65%   Recommend 30d cardiac monitor to look for AF as outpt. If negative, will consider loop recorder  LDL - 42  HgbA1c - 6.1  VTE prophylaxis - SCDs   aspirin 81 mg daily and pletal prior to admission, now on aspirin, pletal and plavix for 3 weeks. After 3 weeks, continue Plavix and pletal.    Therapy recommendations:  CIR  Disposition:  pending   Tachycardia   Hx of paroxysmal atrial tachycardia  Currently HR 120s, most likely related to anemia  Hx of alcoholism  Concerning for occult PAF  Recommend 30d cardiac monitor to look for AF as outpt. If negative, will consider loop recorder  Hypertension  Stable . Long-term BP goal normotensive  Hyperlipidemia  Home meds:  lipitor 40  Resumed lipitor in hospital  LDL 42, goal < 70  Continue statin at discharge  Diabetes type II  HgbA1c 6.1, goal < 7.0  CBGs  SSI  Glucoses elevated could be d/t solumedrol in ED for COPD exacerbation  Close PCP follow up  Anemia,  microcytic  Hemoglobin 10.2-9.6  Sinus tachycardia  Iron 19, ferritin 19  On iron pills  Tobacco abuse  Current smoker  Smoking cessation counseling provided  Pt is willing to quit  Other Stroke Risk Factors  Advanced age  Chronic ETOH use  Coronary artery disease s/p stent  PVD on pletal PTA  Other Active Problems  Acute on chronic hypoxemic respirator failure d/t COPD exacerbation  Hyperkalemia  Prostate cancer  Ascending thoracic aortic aneurysm  GERD  Mild hyponatremia, sodium 130  Hospital day # 2  Neurology will sign off. Please call with questions. Pt will follow up with stroke clinic NP at [facility] in about 4 weeks. Thanks for the consult.  Brandon Hawking, MD PhD Stroke Neurology 10/15/2020 2:30 PM   To contact Stroke Continuity provider, please refer to [email or web address] After hours, contact General Neurology

## 2020-10-15 NOTE — Progress Notes (Signed)
Inpatient Rehab Admissions:  Inpatient Rehab Consult received.  I met with patient at the bedside for rehabilitation assessment and to discuss goals and expectations of an inpatient rehab admission.  Pt interested in inpatient rehab for continued therapy.  Pt's primary insurance does not contract with CIR.  Not sure if able to bill secondary insurance for CIR.  Pt and TOC informed.  Will update pt and TOC as information is determined.  Also awaiting OT evaluation and discharge recommendations.  Signed: Gayland Curry, [place], [place] Admissions Coordinator [phone]

## 2020-10-15 NOTE — Progress Notes (Signed)
PROGRESS NOTE    [place]  NUU:[id number] DOB: 03/21/1946 DOA: 10/13/2020 PCP: Pcp, No   Brief Narrative:  Brandon [name] is a 74 y.o. male with medical history significant of chronic hypoxemic respiratory failure secondary to underlying COPD-on 3 L of oxygen at home, hypertension, hyperlipidemia, GERD, tobacco abuse, coronary artery disease, type 2 diabetes mellitus, PVD presented to ED with left leg weakness and shortness of breath of 1 day duration. EMS was called and was noted that his oxygen saturation was 86% on 3 L by nasal cannula.  They placed patient on nonrebreather with 10 L supplemental oxygen and his sats improved to 99%.  He reported wheezing, leg swelling, shortness of breath even at rest, orthopnea and unable to stand up due to left leg weakness.  No other complaint.  Upon arrival to ED: Patient tachypneic, hypoxic requiring 4 L of oxygen via nasal cannula, afebrile with no leukocytosis, UA negative for infection, patient was found to be hypoglycemic upon arrival with blood glucose of 53.  Received dextrose 50 and his blood sugar improved to 68 and then 144.  Patient received DuoNeb's, albuterol breathing treatment, Solu-Medrol loading dose, Rocephin and azithromycin in ED.  CT head was obtained which was concerning for ischemic stroke.  Neurology consulted.  Patient was admitted under hospitalist service. MRI brain shows multiple small acute infarcts within both hemispheres, possibly embolic.  Patient was on aspirin.  Plavix has been added.  He was also on Pletal.    Assessment & Plan:   Principal Problem:   Ischemic stroke ([place]) Active Problems:   Essential hypertension   Type 2 diabetes mellitus (HCC)   CAD (coronary artery disease)   GERD (gastroesophageal reflux disease)   Acute on chronic respiratory failure with hypoxemia (HCC)   COPD (chronic obstructive pulmonary disease) (HCC)   Tobacco abuse   Hyponatremia   Hyperkalemia  Acute ischemic stroke: -Patient  presented with left leg weakness started yesterday. CT head shows small right corona radiata acute/subacute stroke.  MRI brain shows multiple small acute infarcts within both hemispheres, possibly embolic.  Patient was on aspirin.  Plavix has been added.  He was also on Pletal.  Patient seen by PT OT.  They recommended CIR. pending insurance authorization.  Will need 30-day event monitor.  Will notify cardiology on Monday.  Acute on chronic hypoxemic respiratory failure secondary to acute CHF with preserved ejection fraction: Patient uses 3 L of nasal oxygen at baseline.  He was requiring more than 3 upon admission.  He continues to feel well. Chest x-ray showed vascular congestion.  He received Lasix 40 mg x 1 yesterday and day before.  Lungs clear to auscultation but still on 4 L.  I will repeat 1 dose today.  Transthoracic echo shows normal ejection fraction.  His diagnosis really is acute diastolic congestive heart failure.  He has had good diuresis and his total net negative more than 3 L today.  Monitor strict I's and O's and daily weight.   COPD: Stable, not in exacerbation.  Continue home medications.  Essential  hypertension: Stable -Hold home BP meds-amlodipine, enalapril, metoprolol, Aldactone to allow permissive hypertension.  Monitor blood pressure closely  Hyperkalemia: Resolved  Iron deficiency anemia: Stable.  Watch daily.  Will initiate on iron pills at the time of discharge.  Hyperlipidemia: Continue atorvastatin.  PVD: Continue aspirin, statin, cilostazol  Type 2 diabetes mellitus: Hemoglobin A1c 6.1. Uses Metformin and 70/30, 45 units twice daily.  Was a started on 30 units yesterday.  He is hyperglycemic again.  Will increase to 40 units.  Continue SSI.  Ascending thoracic aortic aneurysm: Noted on CTA chest. -Outpatient follow-up recommended.  GERD: Continue PPI  Tobacco abuse: Counseled about cessation provided again today.  Tachycardia: Nursing informed me  that his heart rate was around 120 sustaining.  He was asymptomatic.  EKG stat has been ordered.  DVT prophylaxis: SCD's Start: 10/13/20 1553   Code Status: Full Code  Family Communication:  None present at bedside.  Plan of care discussed with patient in length and he verbalized understanding and agreed with it.  Status is: Inpatient  Remains inpatient appropriate because:Inpatient level of care appropriate due to severity of illness   Dispo: The patient is from: Home              Anticipated d/c is to: CIR              Anticipated d/c date is: 1 day              Patient currently is not medically stable to d/c.        Estimated body mass index is 25.24 kg/m as calculated from the following:   Height as of this encounter: 6' (1.829 m).   Weight as of this encounter: 84.4 kg.      Nutritional status:  Nutrition Problem: Inadequate oral intake Etiology: poor appetite   Signs/Symptoms: meal completion < 50%   Interventions: Ensure Enlive (each supplement provides 350kcal and 20 grams of protein), MVI, Magic cup    Consultants:   Neurology  Procedures:   None  Antimicrobials:  Anti-infectives (From admission, onward)   Start     Dose/Rate Route Frequency Ordered Stop   10/14/20 1600  azithromycin (ZITHROMAX) tablet 500 mg  Status:  Discontinued        500 mg Oral Daily 10/13/20 1809 10/14/20 1408   10/13/20 1345  cefTRIAXone (ROCEPHIN) 1 g in sodium chloride 0.9 % 100 mL IVPB        1 g 200 mL/hr over 30 Minutes Intravenous  Once 10/13/20 1343 10/13/20 1605   10/13/20 1345  azithromycin (ZITHROMAX) 500 mg in sodium chloride 0.9 % 250 mL IVPB        500 mg 250 mL/hr over 60 Minutes Intravenous  Once 10/13/20 1343 10/13/20 1649         Subjective: Seen and examined.  Feels better without any complaints.  Objective: Vitals:   10/15/20 0810 10/15/20 0814 10/15/20 1117 10/15/20 1143  BP:   124/79   Pulse:      Resp:   (!) 21 20  Temp:   98 F (36.7  C)   TempSrc:   Oral   SpO2: 100% 100% 96%   Weight:      Height:        Intake/Output Summary (Last 24 hours) at 10/15/2020 1414 Last data filed at 10/15/2020 1133 Gross per 24 hour  Intake 240 ml  Output 800 ml  Net -560 ml   Filed Weights   10/13/20 2109  Weight: 84.4 kg    Examination:  General exam: Appears calm and comfortable  Respiratory system: Clear to auscultation. Respiratory effort normal. Cardiovascular system: S1 & S2 heard, RRR. No JVD, murmurs, rubs, gallops or clicks. No pedal edema. Gastrointestinal system: Abdomen is nondistended, soft and nontender. No organomegaly or masses felt. Normal bowel sounds heard. Central nervous system: Alert and oriented. No focal neurological deficits. Extremities: Symmetric 5 x 5 power. Skin: No rashes,  lesions or ulcers.  Psychiatry: Judgement and insight appear normal. Mood & affect appropriate.    Data Reviewed: I have personally reviewed following labs and imaging studies  CBC: Recent Labs  Lab 10/13/20 1215 10/13/20 1332 10/14/20 0126  WBC 8.1  --  4.2  NEUTROABS 6.4  --   --   HGB 10.2* 12.2* 9.6*  HCT 34.4* 36.0* 30.7*  MCV 74.9*  --  71.6*  PLT 329  --  627   Basic Metabolic Panel: Recent Labs  Lab 10/13/20 1215 10/13/20 1332 10/14/20 0126  NA 130* 131* 130*  K 5.4* 5.1 4.2  CL 99  --  98  CO2 19*  --  22  GLUCOSE 95  --  267*  BUN 15  --  13  CREATININE 1.05  --  1.10  CALCIUM 9.1  --  8.8*  MG  --   --  1.5*   GFR: Estimated Creatinine Clearance: 63.7 mL/min (by C-G formula based on SCr of 1.1 mg/dL). Liver Function Tests: Recent Labs  Lab 10/13/20 1215  AST 17  ALT 15  ALKPHOS 60  BILITOT 0.7  PROT 6.6  ALBUMIN 3.4*   No results for input(s): LIPASE, AMYLASE in the last 168 hours. No results for input(s): AMMONIA in the last 168 hours. Coagulation Profile: No results for input(s): INR, PROTIME in the last 168 hours. Cardiac Enzymes: No results for input(s): CKTOTAL, CKMB,  CKMBINDEX, TROPONINI in the last 168 hours. BNP (last 3 results) No results for input(s): PROBNP in the last 8760 hours. HbA1C: Recent Labs    10/14/20 0126  HGBA1C 6.1*   CBG: Recent Labs  Lab 10/14/20 1150 10/14/20 1540 10/14/20 2115 10/15/20 0629 10/15/20 1122  GLUCAP 190* 189* 163* 144* 213*   Lipid Profile: Recent Labs    10/14/20 0126  CHOL 92  HDL 41  LDLCALC 42  TRIG 45  CHOLHDL 2.2   Thyroid Function Tests: Recent Labs    10/14/20 0126  TSH 0.467   Anemia Panel: Recent Labs    10/14/20 0126  FERRITIN 19*  TIBC 350  IRON 19*   Sepsis Labs: No results for input(s): PROCALCITON, LATICACIDVEN in the last 168 hours.  Recent Results (from the past 240 hour(s))  Respiratory Panel by RT PCR (Flu A&B, Covid) - Nasopharyngeal Swab     Status: None   Collection Time: 10/13/20 12:22 PM   Specimen: Nasopharyngeal Swab  Result Value Ref Range Status   SARS Coronavirus 2 by RT PCR NEGATIVE NEGATIVE Final    Comment: (NOTE) SARS-CoV-2 target nucleic acids are NOT DETECTED.  The SARS-CoV-2 RNA is generally detectable in upper respiratoy specimens during the acute phase of infection. The lowest concentration of SARS-CoV-2 viral copies this assay can detect is 131 copies/mL. A negative result does not preclude SARS-Cov-2 infection and should not be used as the sole basis for treatment or other patient management decisions. A negative result may occur with  improper specimen collection/handling, submission of specimen other than nasopharyngeal swab, presence of viral mutation(s) within the areas targeted by this assay, and inadequate number of viral copies (<131 copies/mL). A negative result must be combined with clinical observations, patient history, and epidemiological information. The expected result is Negative.  Fact Sheet for Patients:  PinkCheek.be  Fact Sheet for Healthcare Providers:   GravelBags.it  This test is no t yet approved or cleared by the [place] FDA and  has been authorized for detection and/or diagnosis of SARS-CoV-2 by FDA under an  Emergency Use Authorization (EUA). This EUA will remain  in effect (meaning this test can be used) for the duration of the COVID-19 declaration under Section 564(b)(1) of the Act, 21 U.S.C. section 360bbb-3(b)(1), unless the authorization is terminated or revoked sooner.     Influenza A by PCR NEGATIVE NEGATIVE Final   Influenza B by PCR NEGATIVE NEGATIVE Final    Comment: (NOTE) The Xpert Xpress SARS-CoV-2/FLU/RSV assay is intended as an aid in  the diagnosis of influenza from Nasopharyngeal swab specimens and  should not be used as a sole basis for treatment. Nasal washings and  aspirates are unacceptable for Xpert Xpress SARS-CoV-2/FLU/RSV  testing.  Fact Sheet for Patients: PinkCheek.be  Fact Sheet for Healthcare Providers: GravelBags.it  This test is not yet approved or cleared by the [place] FDA and  has been authorized for detection and/or diagnosis of SARS-CoV-2 by  FDA under an Emergency Use Authorization (EUA). This EUA will remain  in effect (meaning this test can be used) for the duration of the  Covid-19 declaration under Section 564(b)(1) of the Act, 21  U.S.C. section 360bbb-3(b)(1), unless the authorization is  terminated or revoked. Performed at [facility], [place] [address], [place], [place] [address]       Radiology Studies: CT ANGIO HEAD W OR WO CONTRAST  Result Date: 10/13/2020 CLINICAL DATA:  Left-sided weakness EXAM: CT ANGIOGRAPHY HEAD AND NECK TECHNIQUE: Multidetector CT imaging of the head and neck was performed using the standard protocol during bolus administration of intravenous contrast. Multiplanar CT image reconstructions and MIPs were obtained to evaluate the vascular anatomy.  Carotid stenosis measurements (when applicable) are obtained utilizing NASCET criteria, using the distal internal carotid diameter as the denominator. CONTRAST:  48mL OMNIPAQUE IOHEXOL 350 MG/ML SOLN COMPARISON:  CT head earlier same day FINDINGS: CT HEAD Brain: There is no acute intracranial hemorrhage, mass effect, or edema. Gray-white differentiation is preserved. There is no extra-axial fluid collection. Prominence of the ventricles and sulci reflecting stable parenchymal volume loss. Patchy hypoattenuation in the supratentorial white matter likely reflects stable chronic microvascular ischemic changes. As noted previously, a superimposed small vessel infarct of the right corona radiata is not excluded. Vascular: No hyperdense vessel. Skull: Calvarium is unremarkable. Sinuses/Orbits: No acute finding. Other: None. Review of the MIP images confirms the above findings CTA NECK Aortic arch: Calcified plaque along the arch and great vessel origins, which are patent. Right carotid system: Patent. Primarily calcified plaque along the proximal carotid causes less than 50% stenosis. Left carotid system: Patent. Primarily calcified plaque along the proximal internal carotid causes less than 50% stenosis. There is also calcified plaque along the distal internal carotid causing minimal stenosis. Vertebral arteries: Patent. Right vertebral artery is dominant. Multifocal calcified plaque along the right vertebral artery causing up to moderate stenosis. Skeleton: Multilevel cervical spine degenerative changes. Other neck: No mass or adenopathy. Upper chest: Advanced emphysema.  Trace right pleural effusion. Review of the MIP images confirms the above findings CTA HEAD Anterior circulation: Intracranial internal carotid arteries are patent with calcified plaque causing mild stenosis. Anterior cerebral arteries are patent. 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 Middle cerebral arteries are patent. Posterior circulation:  Intracranial vertebral arteries are patent with calcified plaque causing mild stenosis. Basilar artery is patent. Posterior cerebral arteries are patent. Right larger than left posterior communicating arteries are present. Venous sinuses: Patent as allowed by contrast bolus timing. Review of the MIP images confirms the above findings IMPRESSION: No acute intracranial  abnormality. No large vessel occlusion or hemodynamically significant stenosis. Electronically Signed   By: Macy Mis M.D.   On: 10/13/2020 17:00   CT ANGIO NECK W OR WO CONTRAST  Result Date: 10/13/2020 CLINICAL DATA:  Left-sided weakness EXAM: CT ANGIOGRAPHY HEAD AND NECK TECHNIQUE: Multidetector CT imaging of the head and neck was performed using the standard protocol during bolus administration of intravenous contrast. Multiplanar CT image reconstructions and MIPs were obtained to evaluate the vascular anatomy. Carotid stenosis measurements (when applicable) are obtained utilizing NASCET criteria, using the distal internal carotid diameter as the denominator. CONTRAST:  32mL OMNIPAQUE IOHEXOL 350 MG/ML SOLN COMPARISON:  CT head earlier same day FINDINGS: CT HEAD Brain: There is no acute intracranial hemorrhage, mass effect, or edema. Gray-white differentiation is preserved. There is no extra-axial fluid collection. Prominence of the ventricles and sulci reflecting stable parenchymal volume loss. Patchy hypoattenuation in the supratentorial white matter likely reflects stable chronic microvascular ischemic changes. As noted previously, a superimposed small vessel infarct of the right corona radiata is not excluded. Vascular: No hyperdense vessel. Skull: Calvarium is unremarkable. Sinuses/Orbits: No acute finding. Other: None. Review of the MIP images confirms the above findings CTA NECK Aortic arch: Calcified plaque along the arch and great vessel origins, which are patent. Right carotid system: Patent. Primarily calcified plaque along the  proximal carotid causes less than 50% stenosis. Left carotid system: Patent. Primarily calcified plaque along the proximal internal carotid causes less than 50% stenosis. There is also calcified plaque along the distal internal carotid causing minimal stenosis. Vertebral arteries: Patent. Right vertebral artery is dominant. Multifocal calcified plaque along the right vertebral artery causing up to moderate stenosis. Skeleton: Multilevel cervical spine degenerative changes. Other neck: No mass or adenopathy. Upper chest: Advanced emphysema.  Trace right pleural effusion. Review of the MIP images confirms the above findings CTA HEAD Anterior circulation: Intracranial internal carotid arteries are patent with calcified plaque causing mild stenosis. Anterior cerebral arteries are patent. 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 Middle cerebral arteries are patent. Posterior circulation: Intracranial vertebral arteries are patent with calcified plaque causing mild stenosis. Basilar artery is patent. Posterior cerebral arteries are patent. Right larger than left posterior communicating arteries are present. Venous sinuses: Patent as allowed by contrast bolus timing. Review of the MIP images confirms the above findings IMPRESSION: No acute intracranial abnormality. No large vessel occlusion or hemodynamically significant stenosis. Electronically Signed   By: Macy Mis M.D.   On: 10/13/2020 17:00   MR BRAIN WO CONTRAST  Result Date: 10/13/2020 CLINICAL DATA:  Acute neurologic deficit EXAM: MRI HEAD WITHOUT CONTRAST TECHNIQUE: Multiplanar, multiecho pulse sequences of the brain and surrounding structures were obtained without intravenous contrast. COMPARISON:  10/13/2020 CTA head FINDINGS: Brain: Multiple small acute infarcts within both hemispheres, including the right precentral gyrus, left middle frontal gyrus and paramedian left parietal lobe. No acute hemorrhage. Early confluent hyperintense T2-weighted  signal of the periventricular and deep white matter. There is generalized atrophy without lobar predilection. No chronic microhemorrhage. Normal midline structures. Choose 1 Vascular: Normal flow voids. Skull and upper cervical spine: Normal marrow signal. Sinuses/Orbits: Right mastoid effusion and right ocular lens replacement. Other: None IMPRESSION: 1. Multiple small acute infarcts within both hemispheres, possibly of an embolic etiology. 2. Generalized atrophy and findings of chronic microvascular disease. Electronically Signed   By: Ulyses Jarred M.D.   On: 10/13/2020 19:47   ECHOCARDIOGRAM COMPLETE  Result Date: 10/14/2020    ECHOCARDIOGRAM REPORT   Patient Name:  Brandon [name] Date of Exam: 10/14/2020 Medical Rec #:  [id number]     Height:       72.0 in Accession #:    [id number]    Weight:       186.1 lb Date of Birth:  04/16/1946     BSA:          2.066 m Patient Age:    51 years      BP:           90/62 mmHg Patient Gender: M             HR:           86 bpm. Exam Location:  Inpatient Procedure: 2D Echo, Color Doppler and Cardiac Doppler                                 MODIFIED REPORT: This report was modified by Cherlynn Kaiser MD on 10/14/2020 due to non-clinical                                    revision.  Indications:     Stroke 434.91 / I163.9  History:         Patient has no prior history of Echocardiogram examinations.                  CAD; Risk Factors:Hypertension, Diabetes and GERD. Abdominal                  Aortic Aneurysm.  Sonographer:     Tiffany Dance Referring Phys:  [id number] Mckinley Jewel Diagnosing Phys: Cherlynn Kaiser MD IMPRESSIONS  1. Left ventricular ejection fraction, by estimation, is 60 to 65%. The left ventricle has normal function. The left ventricle has no regional wall motion abnormalities. There is moderate left ventricular hypertrophy. Indeterminate diastolic filling due  to E-A fusion.  2. Right ventricular systolic function is normal. The right ventricular size is  mildly enlarged. Tricuspid regurgitation signal is inadequate for assessing PA pressure.  3. The mitral valve is degenerative. Mild mitral valve regurgitation. No evidence of mitral stenosis.  4. Prominent LVOT calcifications extending along base of anterior mitral leaflet.. The aortic valve is abnormal. There is moderate calcification of the aortic valve. Aortic valve regurgitation is mild to moderate. No aortic stenosis is present.  5. Aortic dilatation noted. There is mild dilatation of the aortic root, measuring 40 mm. There is moderate dilatation of the ascending aorta, measuring 48 mm.  6. The inferior vena cava is dilated in size with >50% respiratory variability, suggesting right atrial pressure of 8 mmHg.  7. Cannot exclude small PFO by color flow. Atrial septal aneurysm. Conclusion(s)/Recommendation(s): Bulky LVOT calcifications may be a cardiac source of stroke. FINDINGS  Left Ventricle: Left ventricular ejection fraction, by estimation, is 60 to 65%. The left ventricle has normal function. The left ventricle has no regional wall motion abnormalities. The left ventricular internal cavity size was normal in size. There is  moderate left ventricular hypertrophy. Indeterminate diastolic filling due to E-A fusion. Right Ventricle: The right ventricular size is mildly enlarged. No increase in right ventricular wall thickness. Right ventricular systolic function is normal. Tricuspid regurgitation signal is inadequate for assessing PA pressure. Left Atrium: Left atrial size was normal in size. Right Atrium: Right atrial size was normal in size. Pericardium: There is no evidence  of pericardial effusion. Mitral Valve: The mitral valve is degenerative in appearance. There is mild thickening of the mitral valve leaflet(s). There is moderate calcification of the mitral valve leaflet(s). Mild mitral valve regurgitation. No evidence of mitral valve stenosis. Tricuspid Valve: The tricuspid valve is normal in structure.  Tricuspid valve regurgitation is trivial. No evidence of tricuspid stenosis. Aortic Valve: Prominent LVOT calcifications extending along base of anterior mitral leaflet. The aortic valve is abnormal. There is moderate calcification of the aortic valve. Aortic valve regurgitation is mild to moderate. No aortic stenosis is present. Pulmonic Valve: The pulmonic valve was grossly normal. Pulmonic valve regurgitation is trivial. No evidence of pulmonic stenosis. Aorta: Aortic dilatation noted. There is mild dilatation of the aortic root, measuring 40 mm. There is moderate dilatation of the ascending aorta, measuring 48 mm. Venous: The inferior vena cava is dilated in size with greater than 50% respiratory variability, suggesting right atrial pressure of 8 mmHg. IAS/Shunts: The interatrial septum is aneurysmal. Cannot exclude small PFO by color flow. Atrial septal aneurysm. Additional Comments: A venous catheter is visualized in the right atrium and right ventricle.  LEFT VENTRICLE PLAX 2D LVIDd:         4.30 cm  Diastology LVIDs:         2.80 cm  LV e' medial:    5.33 cm/s LV PW:         1.50 cm  LV E/e' medial:  14.6 LV IVS:        1.40 cm  LV e' lateral:   6.31 cm/s LVOT diam:     2.20 cm  LV E/e' lateral: 12.3 LV SV:         70 LV SV Index:   34 LVOT Area:     3.80 cm  RIGHT VENTRICLE             IVC RV Basal diam:  3.10 cm     IVC diam: 2.30 cm RV Mid diam:    2.60 cm RV S prime:     11.00 cm/s TAPSE (M-mode): 1.7 cm LEFT ATRIUM             Index       RIGHT ATRIUM           Index LA diam:        4.30 cm 2.08 cm/m  RA Area:     19.50 cm LA Vol (A2C):   64.7 ml 31.31 ml/m RA Volume:   58.10 ml  28.12 ml/m LA Vol (A4C):   57.7 ml 27.93 ml/m LA Biplane Vol: 62.8 ml 30.39 ml/m  AORTIC VALVE LVOT Vmax:   89.20 cm/s LVOT Vmean:  58.400 cm/s LVOT VTI:    0.184 m  AORTA Ao Root diam: 4.00 cm Ao Asc diam:  4.80 cm MITRAL VALVE MV Area (PHT): 3.99 cm    SHUNTS MV Decel Time: 190 msec    Systemic VTI:  0.18 m MV E  velocity: 77.60 cm/s  Systemic Diam: 2.20 cm MV A velocity: 91.70 cm/s MV E/A ratio:  0.85 Cherlynn Kaiser MD Electronically signed by Cherlynn Kaiser MD Signature Date/Time: 10/14/2020/12:22:32 PM    Final (Updated)     Scheduled Meds: .  stroke: mapping our early stages of recovery book   Does not apply Once  . aspirin EC  81 mg Oral Daily  . atorvastatin  40 mg Oral QHS  . cilostazol  50 mg Oral BID  . clopidogrel  75 mg Oral Daily  .  dextrose  1 ampule Intravenous Once  . feeding supplement  237 mL Oral BID BM  . ferrous sulfate  325 mg Oral TID WC  . influenza vaccine adjuvanted  0.5 mL Intramuscular Tomorrow-1000  . insulin aspart  0-15 Units Subcutaneous TID WC  . insulin aspart  0-5 Units Subcutaneous QHS  . insulin aspart protamine- aspart  40 Units Subcutaneous BID WC  . mometasone-formoterol  2 puff Inhalation BID  . multivitamin with minerals  1 tablet Oral Daily  . nortriptyline  50 mg Oral QHS  . oxybutynin  10 mg Oral Daily  . pantoprazole  40 mg Oral Daily  . sodium zirconium cyclosilicate  10 g Oral Once  . umeclidinium bromide  1 puff Inhalation Daily   Continuous Infusions:   LOS: 2 days   Time spent: 30 minutes   Darliss Cheney, MD [facility]  10/15/2020, 2:14 PM   To contact the attending provider between 7A-7P or the covering provider during after hours 7P-7A, please log into the web site [email or web address].

## 2020-10-15 NOTE — Social Work (Signed)
CSW contacted by [facility]. Informed they are unsure if patient's secondary insurance can be filed, requested additional referral be sent to [facility].  CSW spoke with patient and was provided permission to send referral to [facility]. CSW faxed referral to [facility].   Darra Lis, [place] Social Worker

## 2020-10-16 DIAGNOSIS — I10 Essential (primary) hypertension: Secondary | ICD-10-CM | POA: Diagnosis not present

## 2020-10-16 LAB — GLUCOSE, CAPILLARY
Glucose-Capillary: 103 mg/dL — ABNORMAL HIGH (ref 70–99)
Glucose-Capillary: 108 mg/dL — ABNORMAL HIGH (ref 70–99)
Glucose-Capillary: 138 mg/dL — ABNORMAL HIGH (ref 70–99)
Glucose-Capillary: 187 mg/dL — ABNORMAL HIGH (ref 70–99)

## 2020-10-16 NOTE — Progress Notes (Signed)
PROGRESS NOTE    Brandon [name]  NLG:[id number] DOB: 07/31/1946 DOA: 10/13/2020 PCP: Pcp, No   Brief Narrative:  Brandon [name] is a 74 y.o. male with medical history significant of chronic hypoxemic respiratory failure secondary to underlying COPD-on 3 L of oxygen at home, hypertension, hyperlipidemia, GERD, tobacco abuse, coronary artery disease, type 2 diabetes mellitus, PVD presented to ED with left leg weakness and shortness of breath of 1 day duration. EMS was called and was noted that his oxygen saturation was 86% on 3 L by nasal cannula.  They placed patient on nonrebreather with 10 L supplemental oxygen and his sats improved to 99%.  He reported wheezing, leg swelling, shortness of breath even at rest, orthopnea and unable to stand up due to left leg weakness.  No other complaint.  Upon arrival to ED: Patient tachypneic, hypoxic requiring 4 L of oxygen via nasal cannula, afebrile with no leukocytosis, UA negative for infection, patient was found to be hypoglycemic upon arrival with blood glucose of 53.  Received dextrose 50 and his blood sugar improved to 68 and then 144.  Patient received DuoNeb's, albuterol breathing treatment, Solu-Medrol loading dose, Rocephin and azithromycin in ED.  CT head was obtained which was concerning for ischemic stroke.  Neurology consulted.  Patient was admitted under hospitalist service. MRI brain shows multiple small acute infarcts within both hemispheres, possibly embolic.  Patient was on aspirin.  Plavix has been added.  He was also on Pletal.    Assessment & Plan:   Principal Problem:   Ischemic stroke (Jun[place]) Active Problems:   Essential hypertension   Type 2 diabetes mellitus (HCC)   CAD (coronary artery disease)   GERD (gastroesophageal reflux disease)   Acute on chronic respiratory failure with hypoxemia (HCC)   COPD (chronic obstructive pulmonary disease) (HCC)   Tobacco abuse   Hyponatremia   Hyperkalemia  Acute ischemic stroke: -Patient  presented with left leg weakness started yesterday. CT head shows small right corona radiata acute/subacute stroke.  MRI brain shows multiple small acute infarcts within both hemispheres, possibly embolic.  Patient was on aspirin.  Plavix has been added.  He was also on Pletal.  Patient seen by PT OT.  They recommended CIR. pending insurance authorization.  Will need 30-day event monitor.  Will notify cardiology on Monday.  Acute on chronic hypoxemic respiratory failure secondary to acute CHF with preserved ejection fraction: Patient uses 3 L of nasal oxygen at baseline.  He was requiring more than 3 upon admission.  He continues to feel well. Chest x-ray showed vascular congestion.  He received Lasix 40 mg x 1 yesterday and day before.  Lungs clear to auscultation but still on 4 L.  I will repeat 1 dose today.  Transthoracic echo shows normal ejection fraction.  His diagnosis really is acute diastolic congestive heart failure.  He has had good diuresis and his total net negative more than 5 L today.  Monitor strict I's and O's and daily weight.   COPD: Stable, not in exacerbation.  Continue home medications.  Essential  hypertension: Stable -Hold home BP meds-amlodipine, enalapril, Aldactone to allow permissive hypertension.  Blood pressure within normal [name].  Hyperkalemia: Resolved  Iron deficiency anemia: Stable.  Watch daily.  Will initiate on iron pills at the time of discharge.  Hyperlipidemia: Continue atorvastatin.  PVD: Continue aspirin, statin, cilostazol  Type 2 diabetes mellitus: Which I will continue along with SSI.  Ascending thoracic aortic aneurysm: Noted on CTA chest. -Outpatient follow-up recommended.  GERD: Continue PPI  Tobacco abuse: Counseled about cessation provided again today.  Tachycardia: Resolved.  EKG showed sinus tachycardia yesterday.  Continue home dose of Toprol-XL.  DVT prophylaxis: SCD's Start: 10/13/20 1553   Code Status: Full Code  Family  Communication:  None present at bedside.  Plan of care discussed with patient in length and he verbalized understanding and agreed with it.  Status is: Inpatient  Remains inpatient appropriate because:Inpatient level of care appropriate due to severity of illness   Dispo: The patient is from: Home              Anticipated d/c is to: CIR              Anticipated d/c date is: 1 day              Patient currently is not medically stable to d/c.        Estimated body mass index is 25.24 kg/m as calculated from the following:   Height as of this encounter: 6' (1.829 m).   Weight as of this encounter: 84.4 kg.      Nutritional status:  Nutrition Problem: Inadequate oral intake Etiology: poor appetite   Signs/Symptoms: meal completion < 50%   Interventions: Ensure Enlive (each supplement provides 350kcal and 20 grams of protein), MVI, Magic cup    Consultants:   Neurology  Procedures:   None  Antimicrobials:  Anti-infectives (From admission, onward)   Start     Dose/Rate Route Frequency Ordered Stop   10/14/20 1600  azithromycin (ZITHROMAX) tablet 500 mg  Status:  Discontinued        500 mg Oral Daily 10/13/20 1809 10/14/20 1408   10/13/20 1345  cefTRIAXone (ROCEPHIN) 1 g in sodium chloride 0.9 % 100 mL IVPB        1 g 200 mL/hr over 30 Minutes Intravenous  Once 10/13/20 1343 10/13/20 1605   10/13/20 1345  azithromycin (ZITHROMAX) 500 mg in sodium chloride 0.9 % 250 mL IVPB        500 mg 250 mL/hr over 60 Minutes Intravenous  Once 10/13/20 1343 10/13/20 1649         Subjective: Seen and examined.  He has no complaints.  Objective: Vitals:   10/16/20 0430 10/16/20 0716 10/16/20 0833 10/16/20 1210  BP: 115/72 110/72  110/74  Pulse: 99 99  (!) 102  Resp: 20 18    Temp: 98.2 F (36.8 C) 98 F (36.7 C)  98 F (36.7 C)  TempSrc: Oral     SpO2: 96% 100% 100% 95%  Weight:      Height:        Intake/Output Summary (Last 24 hours) at 10/16/2020  1315 Last data filed at 10/15/2020 2201 Gross per 24 hour  Intake 240 ml  Output 2025 ml  Net -1785 ml   Filed Weights   10/13/20 2109  Weight: 84.4 kg    Examination:  General exam: Appears calm and comfortable  Respiratory system: Clear to auscultation. Respiratory effort normal. Cardiovascular system: S1 & S2 heard, RRR. No JVD, murmurs, rubs, gallops or clicks. No pedal edema. Gastrointestinal system: Abdomen is nondistended, soft and nontender. No organomegaly or masses felt. Normal bowel sounds heard. Central nervous system: Alert and oriented. No focal neurological deficits. Extremities: Symmetric 5 x 5 power. Skin: No rashes, lesions or ulcers.  Psychiatry: Judgement and insight appear normal. Mood & affect appropriate.    Data Reviewed: I have personally reviewed following labs and imaging studies  CBC: Recent Labs  Lab 10/13/20 1215 10/13/20 1332 10/14/20 0126 10/15/20 1450  WBC 8.1  --  4.2 10.0  NEUTROABS 6.4  --   --   --   HGB 10.2* 12.2* 9.6* 10.1*  HCT 34.4* 36.0* 30.7* 33.8*  MCV 74.9*  --  71.6* 72.7*  PLT 329  --  326 161   Basic Metabolic Panel: Recent Labs  Lab 10/13/20 1215 10/13/20 1332 10/14/20 0126  NA 130* 131* 130*  K 5.4* 5.1 4.2  CL 99  --  98  CO2 19*  --  22  GLUCOSE 95  --  267*  BUN 15  --  13  CREATININE 1.05  --  1.10  CALCIUM 9.1  --  8.8*  MG  --   --  1.5*   GFR: Estimated Creatinine Clearance: 63.7 mL/min (by C-G formula based on SCr of 1.1 mg/dL). Liver Function Tests: Recent Labs  Lab 10/13/20 1215  AST 17  ALT 15  ALKPHOS 60  BILITOT 0.7  PROT 6.6  ALBUMIN 3.4*   No results for input(s): LIPASE, AMYLASE in the last 168 hours. No results for input(s): AMMONIA in the last 168 hours. Coagulation Profile: No results for input(s): INR, PROTIME in the last 168 hours. Cardiac Enzymes: No results for input(s): CKTOTAL, CKMB, CKMBINDEX, TROPONINI in the last 168 hours. BNP (last 3 results) No results for  input(s): PROBNP in the last 8760 hours. HbA1C: Recent Labs    10/14/20 0126  HGBA1C 6.1*   CBG: Recent Labs  Lab 10/15/20 1122 10/15/20 1647 10/15/20 2159 10/16/20 0632 10/16/20 1209  GLUCAP 213* 244* 228* 138* 103*   Lipid Profile: Recent Labs    10/14/20 0126  CHOL 92  HDL 41  LDLCALC 42  TRIG 45  CHOLHDL 2.2   Thyroid Function Tests: Recent Labs    10/14/20 0126  TSH 0.467   Anemia Panel: Recent Labs    10/14/20 0126  FERRITIN 19*  TIBC 350  IRON 19*   Sepsis Labs: No results for input(s): PROCALCITON, LATICACIDVEN in the last 168 hours.  Recent Results (from the past 240 hour(s))  Respiratory Panel by RT PCR (Flu A&B, Covid) - Nasopharyngeal Swab     Status: None   Collection Time: 10/13/20 12:22 PM   Specimen: Nasopharyngeal Swab  Result Value Ref [name] Status   SARS Coronavirus 2 by RT PCR NEGATIVE NEGATIVE Final    Comment: (NOTE) SARS-CoV-2 target nucleic acids are NOT DETECTED.  The SARS-CoV-2 RNA is generally detectable in upper respiratoy specimens during the acute phase of infection. The lowest concentration of SARS-CoV-2 viral copies this assay can detect is 131 copies/mL. A negative result does not preclude SARS-Cov-2 infection and should not be used as the sole basis for treatment or other patient management decisions. A negative result may occur with  improper specimen collection/handling, submission of specimen other than nasopharyngeal swab, presence of viral mutation(s) within the areas targeted by this assay, and inadequate number of viral copies (<131 copies/mL). A negative result must be combined with clinical observations, patient history, and epidemiological information. The expected result is Negative.  Fact Sheet for Patients:  PinkCheek.be  Fact Sheet for Healthcare Providers:  GravelBags.it  This test is no t yet approved or cleared by the [place] FDA and   has been authorized for detection and/or diagnosis of SARS-CoV-2 by FDA under an Emergency Use Authorization (EUA). This EUA will remain  in effect (meaning this test can be used) for the  duration of the COVID-19 declaration under Section 564(b)(1) of the Act, 21 U.S.C. section 360bbb-3(b)(1), unless the authorization is terminated or revoked sooner.     Influenza A by PCR NEGATIVE NEGATIVE Final   Influenza B by PCR NEGATIVE NEGATIVE Final    Comment: (NOTE) The Xpert Xpress SARS-CoV-2/FLU/RSV assay is intended as an aid in  the diagnosis of influenza from Nasopharyngeal swab specimens and  should not be used as a sole basis for treatment. Nasal washings and  aspirates are unacceptable for Xpert Xpress SARS-CoV-2/FLU/RSV  testing.  Fact Sheet for Patients: PinkCheek.be  Fact Sheet for Healthcare Providers: GravelBags.it  This test is not yet approved or cleared by the [place] FDA and  has been authorized for detection and/or diagnosis of SARS-CoV-2 by  FDA under an Emergency Use Authorization (EUA). This EUA will remain  in effect (meaning this test can be used) for the duration of the  Covid-19 declaration under Section 564(b)(1) of the Act, 21  U.S.C. section 360bbb-3(b)(1), unless the authorization is  terminated or revoked. Performed at [facility], [place] [address] [place], [place] [address]       Radiology Studies: No results found.  Scheduled Meds: .  stroke: mapping our early stages of recovery book   Does not apply Once  . aspirin EC  81 mg Oral Daily  . atorvastatin  40 mg Oral QHS  . cilostazol  50 mg Oral BID  . clopidogrel  75 mg Oral Daily  . dextrose  1 ampule Intravenous Once  . feeding supplement  237 mL Oral BID BM  . ferrous sulfate  325 mg Oral TID WC  . influenza vaccine adjuvanted  0.5 mL Intramuscular Tomorrow-1000  . insulin aspart  0-15 Units Subcutaneous TID WC  .  insulin aspart  0-5 Units Subcutaneous QHS  . insulin aspart protamine- aspart  40 Units Subcutaneous BID WC  . metoprolol succinate  150 mg Oral BH-qamhs  . mometasone-formoterol  2 puff Inhalation BID  . multivitamin with minerals  1 tablet Oral Daily  . nortriptyline  50 mg Oral QHS  . oxybutynin  10 mg Oral Daily  . pantoprazole  40 mg Oral Daily  . sodium zirconium cyclosilicate  10 g Oral Once  . umeclidinium bromide  1 puff Inhalation Daily   Continuous Infusions:   LOS: 3 days   Time spent: 28 minutes   Darliss Cheney, MD [facility]  10/16/2020, 1:15 PM   To contact the attending provider between 7A-7P or the covering provider during after hours 7P-7A, please log into the web site [email or web address].

## 2020-10-16 NOTE — Evaluation (Signed)
Occupational Therapy Evaluation Patient Details Name: Brandon [name] MRN: [id number] DOB: [birth year] Today's Date: 10/16/2020    History of Present Illness Patient is a 74 y/o male who presents with left sided weakness and SOB. 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  Also with COPD exacberation. PMH includes chronic hypoxemic respiratory failure secondary to underlying COPD-on 3 L of oxygen at home, HTN, HLD, tobacco abuse, CAD, DM, PVD   Clinical Impression   Pt admitted with above. He demonstrates the below listed deficits and will benefit from continued OT to maximize safety and independence with BADLs.  Pt presents to OT with Mild Rt hemiparesis, impaired coordination, mildly impaired cognition, nystagmus with Rt gaze, impaired balance.  He currently requires min guard A - mod A for ADLs and min - mod A for functional mobility as he looses his balance when turning.  He reports he lives alone and was mod I with ADLs and functional mobility PTA.  Daughter provided some assist with IADLs.  Feel he would benefit from CIR to allow him to maximize safety and independence with ADLs.       Follow Up Recommendations  CIR    Equipment Recommendations  3 in 1 bedside commode;Tub/shower bench    Recommendations for Other Services Rehab consult     Precautions / Restrictions Precautions Precautions: Fall;Other (comment) Precaution Comments: watch 02      Mobility Bed Mobility Overal bed mobility: Needs Assistance Bed Mobility: Supine to Sit     Supine to sit: Supervision        Transfers Overall transfer level: Needs assistance Equipment used: Rolling walker (2 wheeled) Transfers: Sit to/from [facility] Sit to Stand: Min assist Stand pivot transfers: Mod assist       General transfer comment: LOB when turning     Balance Overall balance assessment: Needs assistance Sitting-balance support: Feet  supported;No upper extremity supported Sitting balance-Leahy Scale: Good Sitting balance - Comments: able to don/doff socks while sititng EOB    Standing balance support: Single extremity supported Standing balance-Leahy Scale: Poor Standing balance comment: requires UE support for static standing                            ADL either performed or assessed with clinical judgement   ADL Overall ADL's : Needs assistance/impaired Eating/Feeding: Independent   Grooming: Wash/dry hands;Wash/dry face;Oral care;Brushing hair;Minimal assistance;Standing   Upper Body Bathing: Set up;Sitting   Lower Body Bathing: Minimal assistance;Sit to/from stand   Upper Body Dressing : Set up;Supervision/safety;Sitting   Lower Body Dressing: Minimal assistance;Sit to/from stand   Toilet Transfer: Moderate assistance;Ambulation;Comfort height toilet;Grab bars;RW Armed forces technical officer Details (indicate cue type and reason): Pt with LOB when turning requiring mod A to recover  Toileting- Clothing Manipulation and Hygiene: Minimal assistance;Sit to/from stand       Functional mobility during ADLs: Minimal assistance;Rolling walker       Vision Baseline Vision/History: Cataracts Patient Visual Report: No change from baseline Vision Assessment?: Yes Eye Alignment: Within Functional Limits Alignment/Gaze Preference: Within Defined Limits Tracking/Visual Pursuits: Decreased smoothness of horizontal tracking;Decreased smoothness of vertical tracking Visual Fields: No apparent deficits Additional Comments: Pt with horizontal nystagmus with Rt gaze      Perception Perception Perception Tested?: Yes   Praxis Praxis Praxis tested?: Within functional limits    Pertinent Vitals/Pain Pain Assessment: No/denies pain     Hand Dominance Right   Extremity/Trunk Assessment Upper  Extremity Assessment Upper Extremity Assessment: LUE deficits/detail LUE Deficits / Details: drift noted, grossly 4/5   LUE Coordination: decreased fine motor;decreased gross motor   Lower Extremity Assessment Lower Extremity Assessment: Defer to PT evaluation   Cervical / Trunk Assessment Cervical / Trunk Assessment: Kyphotic   Communication Communication Communication: HOH   Cognition Arousal/Alertness: Awake/alert Behavior During Therapy: WFL for tasks assessed/performed Overall Cognitive Status: No family/caregiver present to determine baseline cognitive functioning                                 General Comments: Pt is a bit slow to respond    General Comments  DOE 2/4 with activity on 3.5L supplemental 02    Exercises     Shoulder Instructions      Home Living Family/patient expects to be discharged to:: Private residence Living Arrangements: Alone Available Help at Discharge: Family Type of Home: House Home Access: Level entry     Home Layout: One level     Bathroom Shower/Tub: Teacher, early years/pre: Standard     Home Equipment: Grab bars - tub/shower;None          Prior Functioning/Environment Level of Independence: Needs assistance  Gait / Transfers Assistance Needed: independent with ambulation ADL's / Homemaking Assistance Needed: Pt is able to perform ADLs mod I.  Pt's daughter visits every 2 weeks and assists with finances, groceries and cleaning.  Pt manges his own meds, and prepares his own meals.  He does drive             OT Problem List: Decreased strength;Decreased activity tolerance;Impaired balance (sitting and/or standing);Decreased coordination;Impaired vision/perception;Decreased cognition;Decreased knowledge of use of DME or AE;Cardiopulmonary status limiting activity      OT Treatment/Interventions: Self-care/ADL training;Neuromuscular education;DME and/or AE instruction;Therapeutic activities;Cognitive remediation/compensation;Visual/perceptual remediation/compensation;Patient/family education;Balance training    OT  Goals(Current goals can be found in the care plan section) Acute Rehab OT Goals Patient Stated Goal: to get back to normal  OT Goal Formulation: With patient Time For Goal Achievement: 10/30/20 Potential to Achieve Goals: Good ADL Goals Pt Will Perform Grooming: with supervision;standing Pt Will Perform Lower Body Bathing: with min guard assist;sit to/from stand Pt Will Perform Lower Body Dressing: with min guard assist;sit to/from stand Pt Will Transfer to Toilet: with min guard assist;ambulating;regular height toilet;bedside commode;grab bars Pt Will Perform Toileting - Clothing Manipulation and hygiene: with min guard assist;sit to/from stand Pt/caregiver will Perform Home Exercise Program: Increased strength;Left upper extremity;With written HEP provided;Independently Additional ADL Goal #1: Pt will be able to independently alternate and divide attention during ADLs  OT Frequency: Min 2X/week   Barriers to D/C: Decreased caregiver support  Pt lives alone        Co-evaluation              AM-PAC OT "6 Clicks" Daily Activity     Outcome Measure Help from another person eating meals?: None Help from another person taking care of personal grooming?: A Little Help from another person toileting, which includes using toliet, bedpan, or urinal?: A Lot Help from another person bathing (including washing, rinsing, drying)?: A Little Help from another person to put on and taking off regular upper body clothing?: A Little Help from another person to put on and taking off regular lower body clothing?: A Little 6 Click Score: 18   End of Session Equipment Utilized During Treatment: Rolling walker;Gait belt;Oxygen Nurse Communication: Mobility  status  Activity Tolerance: Patient tolerated treatment well Patient left: in chair;with call bell/phone within reach;with chair alarm set  OT Visit Diagnosis: Unsteadiness on feet (R26.81);Hemiplegia and hemiparesis Hemiplegia - Right/Left:  Left Hemiplegia - dominant/non-dominant: Non-Dominant Hemiplegia - caused by: Cerebral infarction                Time: 5320-2334 OT Time Calculation (min): 22 min Charges:  OT General Charges $OT Visit: 1 Visit OT Evaluation $OT Eval Moderate Complexity: 1 Mod  Nilsa Nutting., OTR/L Acute Rehabilitation Services Pager [phone] Office [phone]   Lucille Passy M 10/16/2020, 10:15 AM

## 2020-10-17 ENCOUNTER — Other Ambulatory Visit: Payer: Self-pay | Admitting: Physician Assistant

## 2020-10-17 DIAGNOSIS — I639 Cerebral infarction, unspecified: Secondary | ICD-10-CM

## 2020-10-17 DIAGNOSIS — I10 Essential (primary) hypertension: Secondary | ICD-10-CM | POA: Diagnosis not present

## 2020-10-17 DIAGNOSIS — I6340 Cerebral infarction due to embolism of unspecified cerebral artery: Secondary | ICD-10-CM | POA: Diagnosis not present

## 2020-10-17 LAB — GLUCOSE, CAPILLARY
Glucose-Capillary: 128 mg/dL — ABNORMAL HIGH (ref 70–99)
Glucose-Capillary: 140 mg/dL — ABNORMAL HIGH (ref 70–99)
Glucose-Capillary: 161 mg/dL — ABNORMAL HIGH (ref 70–99)
Glucose-Capillary: 181 mg/dL — ABNORMAL HIGH (ref 70–99)

## 2020-10-17 NOTE — Consult Note (Signed)
Physical Medicine and Rehabilitation Consult Reason for Consult: Left side weakness and shortness of breath Referring Physician: [facility]   HPI: Brandon [name] is a 74 y.o. right-handed male with history of chronic hypoxemic respiratory failure secondary to COPD on 3 L oxygen at home, hypertension, hyperlipidemia, tobacco abuse, CAD maintained on aspirin, type 2 diabetes mellitus, peripheral vascular disease.  Per chart review patient lives alone modified independent with ADLs prior to admission and driving short distances.  Daughter provides assist with groceries and housekeeping.  1 level home with level entry.  Presented 10/13/2020 with left-sided weakness and shortness of breath.  CT of the head showed small right corona radiata hypodensity reflecting acute/subacute insult versus chronic microvascular ischemic changes.  Patient did not receive TPA.  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  CT angiogram head and neck no large vessel occlusion or significant stenosis.  CT of the chest showed small bilateral pleural effusions and dependent basilar atelectasis.  A 4.8 cm ascending thoracic aortic aneurysm recommending semiannual imaging.  Admission chemistries potassium 5.4 sodium 130, hemoglobin 10.2, urinalysis negative nitrite.  Echocardiogram with ejection fraction of 60 to 65% no wall motion abnormalities.  Presently maintained on aspirin/pletal and Plavix for CVA prophylaxis x3 weeks and after 3 weeks continue Plavix and pletal.  Hospital course bouts of tachycardia on telemetry heart rate in the 120s to 130s and monitored.  Tolerating a regular diet.  Therapy evaluations completed with recommendations of physical medicine rehab consult.   Review of Systems  Constitutional: Negative for chills and fever.  HENT: Negative for hearing loss.   Eyes: Negative for blurred vision and double vision.  Respiratory: Positive for shortness of breath.    Cardiovascular: Positive for leg swelling. Negative for chest pain.  Gastrointestinal: Positive for constipation. Negative for heartburn, nausea and vomiting.       GERD  Genitourinary: Positive for urgency. Negative for dysuria, flank pain and hematuria.  Musculoskeletal: Positive for joint pain and myalgias.  Skin: Negative for rash.  Neurological: Positive for sensory change and weakness.  Psychiatric/Behavioral: The patient has insomnia.   All other systems reviewed and are negative.  Past Medical History:  Diagnosis Date  . Abdominal aortic aneurysm without rupture ([place])   . CAD (coronary artery disease)    severe on chest CT   . Chronic alcoholism ([place])   . Chronic idiopathic constipation   . Chronic pancreatitis ([place])   . Chronic respiratory failure with hypoxia ([place])   . Essential hypertension   . GERD (gastroesophageal reflux disease)   . Hyperaldosteronism ([place])   . Malignant neoplasm of prostate ([place])   . Nicotine dependence   . Paroxysmal atrial tachycardia ([place])   . Peripheral vascular disease ([place])   . Serrated polyp of colon   . Type 2 diabetes mellitus ([place])    Past Surgical History:  Procedure Laterality Date  . PROSTATECTOMY  12/2012   Family History  Problem Relation Age of Onset  . Hypertension Mother   . Hypertension Father    Social History:  reports that he has been smoking cigarettes. He has been smoking about 0.50 packs per day. He has never used smokeless tobacco. He reports previous alcohol use. No history on file for drug use. Allergies:  Allergies  Allergen Reactions  . Furosemide Injection [Furosemide] Other (See Comments)    swelling  . Hctz [Hydrochlorothiazide]    Medications Prior to Admission  Medication Sig Dispense Refill  . albuterol (  VENTOLIN HFA) 108 (90 Base) MCG/ACT inhaler Inhale 2 puffs into the lungs every 6 (six) hours as needed for wheezing or shortness of breath.     Marland Kitchen aspirin 81 MG EC tablet Take 81 mg by mouth daily.     Marland Kitchen atorvastatin (LIPITOR) 40 MG tablet Take 40 mg by mouth at bedtime.     . cilostazol (PLETAL) 100 MG tablet Take 50 mg by mouth 2 (two) times daily.     . enalapril (VASOTEC) 20 MG tablet Take 20 mg by mouth 2 (two) times daily.     . Fluticasone-Salmeterol (ADVAIR) 250-50 MCG/DOSE AEPB Inhale 1 puff into the lungs 2 (two) times daily.    . insulin aspart protamine - aspart (NOVOLOG 70/30 MIX) (70-30) 100 UNIT/ML FlexPen Inject 45 Units into the skin 2 (two) times daily with a meal.     . metFORMIN (GLUCOPHAGE) 1000 MG tablet Take 1,000 mg by mouth 2 (two) times daily with a meal.     . metoprolol succinate (TOPROL-XL) 100 MG 24 hr tablet Take 150 mg by mouth in the morning and at bedtime.     . nortriptyline (PAMELOR) 50 MG capsule Take 50 mg by mouth at bedtime.     . Omega-3 1000 MG CAPS Take 1,000 mg by mouth in the morning and at bedtime.     Marland Kitchen omeprazole (PRILOSEC) 20 MG capsule Take 20 mg by mouth daily.     Marland Kitchen oxybutynin (DITROPAN-XL) 10 MG 24 hr tablet Take 10 mg by mouth daily.    Marland Kitchen spironolactone (ALDACTONE) 25 MG tablet Take 25 mg by mouth 2 (two) times daily before a meal.     . Tiotropium Bromide Monohydrate 2.5 MCG/ACT AERS Inhale 2 puffs into the lungs daily.    Marland Kitchen KETOROLAC TROMETHAMINE, PF, OP Apply 1 drop to eye in the morning, at noon, in the evening, and at bedtime.      Home: Home Living Family/patient expects to be discharged to:: Private residence Living Arrangements: Alone Available Help at Discharge: Family Type of Home: House Home Access: Level entry [place]: One level Bathroom Shower/Tub: Tub/shower unit [place]: Grab bars - tub/shower, None  Lives With: Alone  Functional History: Prior Function Level of Independence: Needs assistance Gait / Transfers Assistance Needed: independent with ambulation ADL's / Homemaking Assistance Needed: Pt is able to perform ADLs mod I.  Pt's daughter visits every 2 weeks and assists  with finances, groceries and cleaning.  Pt manges his own meds, and prepares his own meals.  He does drive  Comments: Drives. No falls. Does not cook much. Wears 3.5 L 02 at home Functional Status:  Mobility: Bed Mobility Overal bed mobility: Needs Assistance Bed Mobility: Supine to Sit Supine to sit: Supervision General bed mobility comments: Use of rail to assist. + dizziness which resolved. Transfers Overall transfer level: Needs assistance Equipment used: Rolling walker (2 wheeled) Transfers: Sit to/from Stand, [employer] Transfers Sit to Stand: Min assist Stand pivot transfers: Mod assist General transfer comment: LOB when turning  Ambulation/Gait Ambulation/Gait assistance: Min assist Gait Distance (Feet): 16 Feet (+ 25') Assistive device: None, Rolling walker (2 wheeled) Gait Pattern/deviations: Step-through pattern, Decreased stride length, Wide base of support General Gait Details: Slow, unsteady and incoordinated gait BLEs with bil knee instability. 2/4 DOE. Sp02 difficult getting readying, likely dropping to 80s. Used RW back to bed, cues for RW proximity, management. Gait velocity: decreased Gait velocity interpretation: <1.31 ft/sec, indicative of household ambulator  ADL: ADL Overall ADL's : Needs assistance/impaired Eating/Feeding: Independent Grooming: Wash/dry hands, Wash/dry face, Oral care, Brushing hair, Minimal assistance, Standing Upper Body Bathing: Set up, Sitting Lower Body Bathing: Minimal assistance, Sit to/from stand Upper Body Dressing : Set up, Supervision/safety, Sitting Lower Body Dressing: Minimal assistance, Sit to/from stand Toilet Transfer: Moderate assistance, Ambulation, Comfort height toilet, Grab bars, RW Toilet Transfer Details (indicate cue type and reason): Pt with LOB when turning requiring mod A to recover  Toileting- Clothing Manipulation and Hygiene: Minimal assistance, Sit to/from stand Functional mobility during ADLs: Minimal  assistance, Rolling walker  Cognition: Cognition Overall Cognitive Status: No family/caregiver present to determine baseline cognitive functioning Arousal/Alertness: Awake/alert Orientation Level: Oriented to person Attention: Sustained Sustained Attention: Appears intact Memory: Appears intact Memory Recall Sock: Without Cue Memory Recall Blue: Without Cue Memory Recall Bed: Without Cue Awareness: Appears intact Problem Solving: Appears intact Safety/Judgment: Appears intact Cognition Arousal/Alertness: Awake/alert Behavior During Therapy: WFL for tasks assessed/performed Overall Cognitive Status: No family/caregiver present to determine baseline cognitive functioning General Comments: Pt is a bit slow to respond   Blood pressure (!) 99/56, pulse 90, temperature 98 F (36.7 C), temperature source Oral, resp. rate 16, height 6' (1.829 m), weight 84.4 kg, SpO2 98 %. Physical Exam General: Alert and oriented x 2, No apparent distress HEENT: Head is normocephalic, atraumatic, PERRLA, EOMI, sclera anicteric, oral mucosa pink and moist, dentition intact, ext ear canals clear,  Neck: Supple without JVD or lymphadenopathy Heart: Reg rate and rhythm. No murmurs rubs or gallops Chest: CTA bilaterally without wheezes, rales, or rhonchi; no distress Abdomen: Soft, non-tender, non-distended, bowel sounds positive. Extremities: No clubbing, cyanosis, or edema. Pulses are 2+ Skin: Clean and intact without signs of breakdown Neuro:  Patient is alert in no acute distress makes good eye contact with examiner follows commands.  Oriented x3. 4/5 strength throughout. Delayed processing.  Psych: Pt's affect is appropriate. Pt is cooperative   Results for orders placed or performed during the hospital encounter of 10/13/20 (from the past 24 hour(s))  Glucose, capillary     Status: Abnormal   Collection Time: 10/16/20 12:09 PM  Result Value Ref Range   Glucose-Capillary 103 (H) 70 - 99 mg/dL   Glucose, capillary     Status: Abnormal   Collection Time: 10/16/20  3:56 PM  Result Value Ref Range   Glucose-Capillary 187 (H) 70 - 99 mg/dL  Glucose, capillary     Status: Abnormal   Collection Time: 10/16/20 11:51 PM  Result Value Ref Range   Glucose-Capillary 108 (H) 70 - 99 mg/dL  Glucose, capillary     Status: Abnormal   Collection Time: 10/17/20  6:03 AM  Result Value Ref Range   Glucose-Capillary 128 (H) 70 - 99 mg/dL   Comment 1 Notify RN    Comment 2 Document in Chart    No results found.   Assessment/Plan: Diagnosis: b/l ACAs, left MCA/ACA, and MCA/PCA punctate infarcts, embolic 1. Does the need for close, 24 hr/day medical supervision in concert with the patient's rehab needs make it unreasonable for this patient to be served in a less intensive setting? Yes 2. Co-Morbidities requiring supervision/potential complications: type 2 DM, PVD, paroxysmal atrial tachycardia, essential HTN, tobacco abuse, chronic alcoholism, SOB, LLE weakness, b/l pedal edema 3. Due to bladder management, bowel management, safety, skin/wound care, disease management, medication administration, pain management and patient education, does the patient require 24 hr/day rehab nursing? Yes 4. Does the patient require coordinated care of a physician, rehab  nurse, therapy disciplines of PT, OT, SLP to address physical and functional deficits in the context of the above medical diagnosis(es)? Yes Addressing deficits in the following areas: balance, endurance, locomotion, strength, transferring, bowel/bladder control, bathing, dressing, feeding, grooming, toileting, cognition and psychosocial support 5. Can the patient actively participate in an intensive therapy program of at least 3 hrs of therapy per day at least 5 days per week? Yes 6. The potential for patient to make measurable gains while on inpatient rehab is excellent 7. Anticipated functional outcomes upon discharge from inpatient rehab are  modified independent  with PT, modified independent with OT, modified independent with SLP. 8. Estimated rehab length of stay to reach the above functional goals is: 10-14 days 9. Anticipated discharge destination: Home 10. Overall Rehab/Functional Prognosis: excellent  RECOMMENDATIONS: This patient's condition is appropriate for continued rehabilitative care in the following setting: CIR Patient has agreed to participate in recommended program. Yes Note that insurance prior authorization may be required for reimbursement for recommended care.  Comment: Thank you for this consult. Admission coordinator to follow.   I have personally performed a face to face diagnostic evaluation, including, but not limited to relevant history and physical exam findings, of this patient and developed relevant assessment and plan.  Additionally, I have reviewed and concur with the physician assistant's documentation above.  Leeroy Cha, MD  Lavon Paganini Despard, PA-C 10/17/2020

## 2020-10-17 NOTE — Progress Notes (Addendum)
Inpatient Rehab Admissions:  Inpatient Rehab Consult received.  I met with patient at the bedside for rehabilitation assessment and to discuss goals and expectations of an inpatient rehab admission.  Pt acknowledged understanding of CIR goals and expectations.  Informed pt CIR is able to defer billing [facility] and able to bill his secondary insurance [employer].  Pt interested in pursuing CIR. Pt appears to be an appropriate candidate.  Will continue to follow.   Spoke with pt's brother, [name] at 62. Explained CIR goals and expectations to him.  He acknowledged understanding.  He also acknowledged that he could intermittently provide assistance/supervision for pt once he is discharged home. Will begin insurance authorization.   Signed: Gayland Curry, [place], [place] Admissions Coordinator [phone]

## 2020-10-17 NOTE — Progress Notes (Signed)
Physical Therapy Treatment Patient Details Name: Brandon [name] MRN: [id number] DOB: [birth year] Today's Date: 10/17/2020    History of Present Illness Patient is a 74 y/o male who presents with left sided weakness and SOB. 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  Also with COPD exacberation. PMH includes chronic hypoxemic respiratory failure secondary to underlying COPD-on 3 L of oxygen at home, HTN, HLD, tobacco abuse, CAD, DM, PVD    PT Comments    Pt making steady progress with mobility. Shoes donned today for ambulation. Pt requires min A for balance with transfers. Pt ambulated 43' with RW and min A +2 for safety and equipment with narrow BOS and increased assistance with turning due to near cross over gait. Pt ambulated a second bout of 40' but could not go further than this at a time without recovery due to DOE and SPO2 into upper 70's on 4 L room air. Continue to recommend CIR for further rehab for pt to return to mod I level to return home. PT will continue to follow.    Follow Up Recommendations  CIR;Supervision for mobility/OOB     Equipment Recommendations  Other (comment) (RW vs cane)    Recommendations for Other Services       Precautions / Restrictions Precautions Precautions: Fall;Other (comment) Precaution Comments: watch 02 Restrictions Weight Bearing Restrictions: No    Mobility  Bed Mobility Overal bed mobility: Needs Assistance Bed Mobility: Supine to Sit     Supine to sit: Supervision     General bed mobility comments: Use of rail to assist and increased time, no dizziness today  Transfers Overall transfer level: Needs assistance Equipment used: Rolling walker (2 wheeled) Transfers: Sit to/from Stand Sit to Stand: Min assist         General transfer comment: stood to RW from bed, vc's for hand placement and for widening BOS as pt stands with feet touching making him more  unsteady  Ambulation/Gait Ambulation/Gait assistance: Min assist;+2 safety/equipment Gait Distance (Feet): 40 Feet (10' + 40' + 40') Assistive device: Rolling walker (2 wheeled) Gait Pattern/deviations: Step-through pattern;Decreased stride length;Narrow base of support Gait velocity: decreased Gait velocity interpretation: <1.31 ft/sec, indicative of household ambulator General Gait Details: pt noted to have wide BOS last session and this session had very narrow base with occasional cross over with min A to correct to prevent fall. Pt especially had difficulty with changes in direction. Pt could not ambulate further than 40' before needing rest break due to DOE. Heavy reliance on RW. SPO2 upper 70's on 4L O2. Gait pattern complicated by BLE neuropathy with noted increased foot slap. This may be basline for him?   Stairs             Wheelchair Mobility    Modified Rankin (Stroke Patients Only) Modified Rankin (Stroke Patients Only) Pre-Morbid Rankin Score: Slight disability Modified Rankin: Moderately severe disability     Balance Overall balance assessment: Needs assistance Sitting-balance support: Feet supported;No upper extremity supported Sitting balance-Leahy Scale: Good     Standing balance support: Single extremity supported Standing balance-Leahy Scale: Poor Standing balance comment: requires UE support for static standing                             Cognition Arousal/Alertness: Awake/alert Behavior During Therapy: WFL for tasks assessed/performed Overall Cognitive Status: No family/caregiver present to determine baseline cognitive functioning  General Comments: Pt is a bit slow to respond but appropriate with conversation and able to relay detailed recent and past history      Exercises General Exercises - Lower Extremity Long Arc Quad: AROM;Both;10 reps;Seated Hip Flexion/Marching: AROM;Both;10  reps;Seated;Standing Other Exercises Other Exercises: performed seated lateral flexion activities before ambulation for increased trunk activation. 5x each side    General Comments        Pertinent Vitals/Pain Pain Assessment: No/denies pain    Home Living                      Prior Function            PT Goals (current goals can now be found in the care plan section) Acute Rehab PT Goals Patient Stated Goal: to get back to normal  PT Goal Formulation: With patient Time For Goal Achievement: 10/28/20 Potential to Achieve Goals: Good Progress towards PT goals: Progressing toward goals    Frequency    Min 4X/week      PT Plan Current plan remains appropriate    Co-evaluation              AM-PAC PT "6 Clicks" Mobility   Outcome Measure  Help needed turning from your back to your side while in a flat bed without using bedrails?: None Help needed moving from lying on your back to sitting on the side of a flat bed without using bedrails?: A Little Help needed moving to and from a bed to a chair (including a wheelchair)?: A Little Help needed standing up from a chair using your arms (e.g., wheelchair or bedside chair)?: A Little Help needed to walk in hospital room?: A Little Help needed climbing 3-5 steps with a railing? : A Lot 6 Click Score: 18    End of Session Equipment Utilized During Treatment: Gait belt;Oxygen Activity Tolerance: Patient tolerated treatment well Patient left: with call bell/phone within reach;in chair;with chair alarm set Nurse Communication: Mobility status PT Visit Diagnosis: Difficulty in walking, not elsewhere classified (R26.2);Unsteadiness on feet (R26.81);Hemiplegia and hemiparesis Hemiplegia - Right/Left: Left Hemiplegia - dominant/non-dominant: Non-dominant Hemiplegia - caused by: Cerebral infarction     Time: 1022-1056 PT Time Calculation (min) (ACUTE ONLY): 34 min  Charges:  $Gait Training: 8-22  mins $Therapeutic Exercise: 8-22 mins                     [place]  Pager [phone] Office [place] 10/17/2020, 12:00 PM

## 2020-10-17 NOTE — Progress Notes (Signed)
PROGRESS NOTE    Brandon [name]  BZJ:[id number] DOB: 1946/04/13 DOA: 10/13/2020 PCP: Pcp, No   Brief Narrative:  Brandon [name] is a 74 y.o. male with medical history significant of chronic hypoxemic respiratory failure secondary to underlying COPD-on 3 L of oxygen at home, hypertension, hyperlipidemia, GERD, tobacco abuse, coronary artery disease, type 2 diabetes mellitus, PVD presented to ED with left leg weakness and shortness of breath of 1 day duration. EMS was called and was noted that his oxygen saturation was 86% on 3 L by nasal cannula.  They placed patient on nonrebreather with 10 L supplemental oxygen and his sats improved to 99%.  He reported wheezing, leg swelling, shortness of breath even at rest, orthopnea and unable to stand up due to left leg weakness.  No other complaint.  Upon arrival to ED: Patient tachypneic, hypoxic requiring 4 L of oxygen via nasal cannula, afebrile with no leukocytosis, UA negative for infection, patient was found to be hypoglycemic upon arrival with blood glucose of 53.  Received dextrose 50 and his blood sugar improved to 68 and then 144.  Patient received DuoNeb's, albuterol breathing treatment, Solu-Medrol loading dose, Rocephin and azithromycin in ED.  CT head was obtained which was concerning for ischemic stroke.  Neurology consulted.  Patient was admitted under hospitalist service. MRI brain shows multiple small acute infarcts within both hemispheres, possibly embolic.  Patient was on aspirin.  Plavix has been added.  He was also on Pletal.    Assessment & Plan:   Principal Problem:   Ischemic stroke ([place]) Active Problems:   Essential hypertension   Type 2 diabetes mellitus (HCC)   CAD (coronary artery disease)   GERD (gastroesophageal reflux disease)   Acute on chronic respiratory failure with hypoxemia (HCC)   COPD (chronic obstructive pulmonary disease) (HCC)   Tobacco abuse   Hyponatremia   Hyperkalemia  Acute ischemic stroke: -Patient  presented with left leg weakness started yesterday. CT head shows small right corona radiata acute/subacute stroke.  MRI brain shows multiple small acute infarcts within both hemispheres, possibly embolic.  Patient was on aspirin.  Plavix has been added.  He was also on Pletal.  Patient seen by PT OT.  They recommended CIR. pending insurance authorization.  Will need 30-day event monitor.  I have notified cardiology.  Acute on chronic hypoxemic respiratory failure secondary to acute CHF with preserved ejection fraction: Patient uses 3 L of nasal oxygen at baseline.  He was requiring more than 3 upon admission.  He continues to feel well. Chest x-ray showed vascular congestion.  He received Lasix 40 mg x 1 for first 3 days of admission.  Lungs clear to auscultation but still on 4 L which is kind of his baseline. Transthoracic echo shows normal ejection fraction.  His diagnosis really is acute diastolic congestive heart failure.  He has had good diuresis and his total net negative more than 5 L up until yesterday.  Interestingly, no I's and O's have been documented for last 24 hours.  No need for Lasix anymore.  Was not on any Lasix prior to admission.  Will reassess daily for any needs.  COPD: Stable, not in exacerbation.  Continue home medications.  Essential  hypertension: Stable -Hold home BP meds-amlodipine, enalapril, Aldactone to allow permissive hypertension.  Blood pressure within normal range.  Hyperkalemia: Resolved  Iron deficiency anemia: Stable.  Watch daily.  Will initiate on iron pills at the time of discharge.  Hyperlipidemia: Continue atorvastatin.  PVD: Continue aspirin, statin,  cilostazol  Type 2 diabetes mellitus: Controlled I will continue along with SSI.  Ascending thoracic aortic aneurysm: Noted on CTA chest. -Outpatient follow-up recommended.  GERD: Continue PPI  Tobacco abuse: Counseled about cessation provided again today.  Tachycardia: Resolved.  EKG  showed sinus tachycardia yesterday.  Continue home dose of Toprol-XL.  DVT prophylaxis: SCD's Start: 10/13/20 1553   Code Status: Full Code  Family Communication:  None present at bedside.  Plan of care discussed with patient in length and he verbalized understanding and agreed with it.  Status is: Inpatient  Remains inpatient appropriate because:Inpatient level of care appropriate due to severity of illness   Dispo: The patient is from: Home              Anticipated d/c is to: CIR              Anticipated d/c date is: 1 day, waiting for bed and insurance authorization.  He has [employer].              Patient currently is not medically stable to d/c.        Estimated body mass index is 25.24 kg/m as calculated from the following:   Height as of this encounter: 6' (1.829 m).   Weight as of this encounter: 84.4 kg.      Nutritional status:  Nutrition Problem: Inadequate oral intake Etiology: poor appetite   Signs/Symptoms: meal completion < 50%   Interventions: Ensure Enlive (each supplement provides 350kcal and 20 grams of protein), MVI, Magic cup    Consultants:   Neurology  Procedures:   None  Antimicrobials:  Anti-infectives (From admission, onward)   Start     Dose/Rate Route Frequency Ordered Stop   10/14/20 1600  azithromycin (ZITHROMAX) tablet 500 mg  Status:  Discontinued        500 mg Oral Daily 10/13/20 1809 10/14/20 1408   10/13/20 1345  cefTRIAXone (ROCEPHIN) 1 g in sodium chloride 0.9 % 100 mL IVPB        1 g 200 mL/hr over 30 Minutes Intravenous  Once 10/13/20 1343 10/13/20 1605   10/13/20 1345  azithromycin (ZITHROMAX) 500 mg in sodium chloride 0.9 % 250 mL IVPB        500 mg 250 mL/hr over 60 Minutes Intravenous  Once 10/13/20 1343 10/13/20 1649         Subjective: Seen and examined.  No complaints.  Objective: Vitals:   10/16/20 2055 10/16/20 2352 10/17/20 0341 10/17/20 0807  BP:  104/70 (!) 99/56 107/69  Pulse:  94 90 91   Resp:   16 19  Temp:  98.2 F (36.8 C) 98 F (36.7 C) 97.6 F (36.4 C)  TempSrc:  Oral Oral Oral  SpO2: 91% 100% 98% 98%  Weight:      Height:       No intake or output data in the 24 hours ending 10/17/20 1055 Filed Weights   10/13/20 2109  Weight: 84.4 kg    Examination:  General exam: Appears calm and comfortable  Respiratory system: Clear to auscultation. Respiratory effort normal. Cardiovascular system: S1 & S2 heard, RRR. No JVD, murmurs, rubs, gallops or clicks. No pedal edema. Gastrointestinal system: Abdomen is nondistended, soft and nontender. No organomegaly or masses felt. Normal bowel sounds heard. Central nervous system: Alert and oriented. No focal neurological deficits. Extremities: Symmetric 5 x 5 power. Skin: No rashes, lesions or ulcers.  Psychiatry: Judgement and insight appear normal. Mood & affect appropriate.  Data Reviewed: I have personally reviewed following labs and imaging studies  CBC: Recent Labs  Lab 10/13/20 1215 10/13/20 1332 10/14/20 0126 10/15/20 1450  WBC 8.1  --  4.2 10.0  NEUTROABS 6.4  --   --   --   HGB 10.2* 12.2* 9.6* 10.1*  HCT 34.4* 36.0* 30.7* 33.8*  MCV 74.9*  --  71.6* 72.7*  PLT 329  --  326 893   Basic Metabolic Panel: Recent Labs  Lab 10/13/20 1215 10/13/20 1332 10/14/20 0126  NA 130* 131* 130*  K 5.4* 5.1 4.2  CL 99  --  98  CO2 19*  --  22  GLUCOSE 95  --  267*  BUN 15  --  13  CREATININE 1.05  --  1.10  CALCIUM 9.1  --  8.8*  MG  --   --  1.5*   GFR: Estimated Creatinine Clearance: 63.7 mL/min (by C-G formula based on SCr of 1.1 mg/dL). Liver Function Tests: Recent Labs  Lab 10/13/20 1215  AST 17  ALT 15  ALKPHOS 60  BILITOT 0.7  PROT 6.6  ALBUMIN 3.4*   No results for input(s): LIPASE, AMYLASE in the last 168 hours. No results for input(s): AMMONIA in the last 168 hours. Coagulation Profile: No results for input(s): INR, PROTIME in the last 168 hours. Cardiac Enzymes: No results for  input(s): CKTOTAL, CKMB, CKMBINDEX, TROPONINI in the last 168 hours. BNP (last 3 results) No results for input(s): PROBNP in the last 8760 hours. HbA1C: No results for input(s): HGBA1C in the last 72 hours. CBG: Recent Labs  Lab 10/16/20 0632 10/16/20 1209 10/16/20 1556 10/16/20 2351 10/17/20 0603  GLUCAP 138* 103* 187* 108* 128*   Lipid Profile: No results for input(s): CHOL, HDL, LDLCALC, TRIG, CHOLHDL, LDLDIRECT in the last 72 hours. Thyroid Function Tests: No results for input(s): TSH, T4TOTAL, FREET4, T3FREE, THYROIDAB in the last 72 hours. Anemia Panel: No results for input(s): VITAMINB12, FOLATE, FERRITIN, TIBC, IRON, RETICCTPCT in the last 72 hours. Sepsis Labs: No results for input(s): PROCALCITON, LATICACIDVEN in the last 168 hours.  Recent Results (from the past 240 hour(s))  Respiratory Panel by RT PCR (Flu A&B, Covid) - Nasopharyngeal Swab     Status: None   Collection Time: 10/13/20 12:22 PM   Specimen: Nasopharyngeal Swab  Result Value Ref Range Status   SARS Coronavirus 2 by RT PCR NEGATIVE NEGATIVE Final    Comment: (NOTE) SARS-CoV-2 target nucleic acids are NOT DETECTED.  The SARS-CoV-2 RNA is generally detectable in upper respiratoy specimens during the acute phase of infection. The lowest concentration of SARS-CoV-2 viral copies this assay can detect is 131 copies/mL. A negative result does not preclude SARS-Cov-2 infection and should not be used as the sole basis for treatment or other patient management decisions. A negative result may occur with  improper specimen collection/handling, submission of specimen other than nasopharyngeal swab, presence of viral mutation(s) within the areas targeted by this assay, and inadequate number of viral copies (<131 copies/mL). A negative result must be combined with clinical observations, patient history, and epidemiological information. The expected result is Negative.  Fact Sheet for Patients:   PinkCheek.be  Fact Sheet for Healthcare Providers:  GravelBags.it  This test is no t yet approved or cleared by the [place] FDA and  has been authorized for detection and/or diagnosis of SARS-CoV-2 by FDA under an Emergency Use Authorization (EUA). This EUA will remain  in effect (meaning this test can be used) for the  duration of the COVID-19 declaration under Section 564(b)(1) of the Act, 21 U.S.C. section 360bbb-3(b)(1), unless the authorization is terminated or revoked sooner.     Influenza A by PCR NEGATIVE NEGATIVE Final   Influenza B by PCR NEGATIVE NEGATIVE Final    Comment: (NOTE) The Xpert Xpress SARS-CoV-2/FLU/RSV assay is intended as an aid in  the diagnosis of influenza from Nasopharyngeal swab specimens and  should not be used as a sole basis for treatment. Nasal washings and  aspirates are unacceptable for Xpert Xpress SARS-CoV-2/FLU/RSV  testing.  Fact Sheet for Patients: PinkCheek.be  Fact Sheet for Healthcare Providers: GravelBags.it  This test is not yet approved or cleared by the [place] FDA and  has been authorized for detection and/or diagnosis of SARS-CoV-2 by  FDA under an Emergency Use Authorization (EUA). This EUA will remain  in effect (meaning this test can be used) for the duration of the  Covid-19 declaration under Section 564(b)(1) of the Act, 21  U.S.C. section 360bbb-3(b)(1), unless the authorization is  terminated or revoked. Performed at [facility], [place] [address], [place],  [address]       Radiology Studies: No results found.  Scheduled Meds: .  stroke: mapping our early stages of recovery book   Does not apply Once  . aspirin EC  81 mg Oral Daily  . atorvastatin  40 mg Oral QHS  . cilostazol  50 mg Oral BID  . clopidogrel  75 mg Oral Daily  . dextrose  1 ampule Intravenous Once  . feeding  supplement  237 mL Oral BID BM  . ferrous sulfate  325 mg Oral TID WC  . influenza vaccine adjuvanted  0.5 mL Intramuscular Tomorrow-1000  . insulin aspart  0-15 Units Subcutaneous TID WC  . insulin aspart  0-5 Units Subcutaneous QHS  . insulin aspart protamine- aspart  40 Units Subcutaneous BID WC  . metoprolol succinate  150 mg Oral BH-qamhs  . mometasone-formoterol  2 puff Inhalation BID  . multivitamin with minerals  1 tablet Oral Daily  . nortriptyline  50 mg Oral QHS  . oxybutynin  10 mg Oral Daily  . pantoprazole  40 mg Oral Daily  . sodium zirconium cyclosilicate  10 g Oral Once  . umeclidinium bromide  1 puff Inhalation Daily   Continuous Infusions:   LOS: 4 days   Time spent: 27 minutes   Darliss Cheney, MD [facility]  10/17/2020, 10:55 AM   To contact the attending provider between 7A-7P or the covering provider during after hours 7P-7A, please log into the web site [email or web address].

## 2020-10-18 DIAGNOSIS — I10 Essential (primary) hypertension: Secondary | ICD-10-CM | POA: Diagnosis not present

## 2020-10-18 LAB — GLUCOSE, CAPILLARY
Glucose-Capillary: 151 mg/dL — ABNORMAL HIGH (ref 70–99)
Glucose-Capillary: 94 mg/dL (ref 70–99)
Glucose-Capillary: 179 mg/dL — ABNORMAL HIGH (ref 70–99)
Glucose-Capillary: 235 mg/dL — ABNORMAL HIGH (ref 70–99)

## 2020-10-18 NOTE — Progress Notes (Signed)
Physical Therapy Treatment Patient Details Name: Brandon [name] MRN: [id number] DOB: [birth year] Today's Date: 10/18/2020    History of Present Illness Patient is a 74 y/o male who presents with left sided weakness and SOB. 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  Also with COPD exacberation. PMH includes chronic hypoxemic respiratory failure secondary to underlying COPD-on 3 L of oxygen at home, HTN, HLD, tobacco abuse, CAD, DM, PVD    PT Comments    Pt continuing to work on standing balance and strengthening activities. Performed sit>stand x3 with hands on knees. Pt required mod A first trial but progressed to min A. Ambulated 81' x2 with RW with 4L O2 and sats dropped to 84%. COntinue to recommend CIR at d/c.  Pt mentions he could go home with his daughter short term for 24/7 supervision. PT will continue to follow.   Follow Up Recommendations  CIR;Supervision for mobility/OOB     Equipment Recommendations  Rolling walker with 5" wheels    Recommendations for Other Services       Precautions / Restrictions Precautions Precautions: Fall;Other (comment) Precaution Comments: watch 02 Restrictions Weight Bearing Restrictions: No    Mobility  Bed Mobility Overal bed mobility: Needs Assistance Bed Mobility: Supine to Sit     Supine to sit: Supervision     General bed mobility comments: use of rail, increased time to remove covers from LE's.   Transfers Overall transfer level: Needs assistance Equipment used: Rolling walker (2 wheeled) Transfers: Sit to/from Stand Sit to Stand: Min assist         General transfer comment: worked on standing from bed with hands on knees. Pt unable to complete without mod A with first trial but improved with practice and was able to complete with min 10% second 2 times.   Ambulation/Gait Ambulation/Gait assistance: Min assist;+2 safety/equipment Gait Distance (Feet): 40 Feet  (2x) Assistive device: Rolling walker (2 wheeled) Gait Pattern/deviations: Step-through pattern;Decreased stride length;Trunk flexed Gait velocity: decreased Gait velocity interpretation: <1.8 ft/sec, indicate of risk for recurrent falls General Gait Details: pt improved gait pattern today and did not cross over with turning. Initiated standing activity without UE support but pt could not safely maintain standing to work on ambulation without UE support. SpO2 84% after ambulation on 4L O2   Stairs             Wheelchair Mobility    Modified Rankin (Stroke Patients Only) Modified Rankin (Stroke Patients Only) Pre-Morbid Rankin Score: Slight disability Modified Rankin: Moderately severe disability     Balance Overall balance assessment: Needs assistance Sitting-balance support: Feet supported;No upper extremity supported Sitting balance-Leahy Scale: Good     Standing balance support: Single extremity supported Standing balance-Leahy Scale: Poor Standing balance comment: worked on standing without support and placing arms in different positions (abd, flex to 90 deg and overhead) . Pt needed to place hands on RW in between each position                            Cognition Arousal/Alertness: Awake/alert Behavior During Therapy: [facility] for tasks assessed/performed Overall Cognitive Status: Within Functional Limits for tasks assessed                                 General Comments: pt remembered therapists name from yesterday and was able to have higher level conversation about  discharge options      Exercises General Exercises - Lower Extremity Hip Flexion/Marching: AROM;Both;10 reps;Standing    General Comments General comments (skin integrity, edema, etc.): DOE 2/4 with ambulation      Pertinent Vitals/Pain Pain Assessment: No/denies pain    Home Living                      Prior Function            PT Goals (current goals  can now be found in the care plan section) Acute Rehab PT Goals Patient Stated Goal: to get back to normal  PT Goal Formulation: With patient Time For Goal Achievement: 10/28/20 Potential to Achieve Goals: Good Progress towards PT goals: Progressing toward goals    Frequency    Min 4X/week      PT Plan Current plan remains appropriate    Co-evaluation              AM-PAC PT "6 Clicks" Mobility   Outcome Measure  Help needed turning from your back to your side while in a flat bed without using bedrails?: None Help needed moving from lying on your back to sitting on the side of a flat bed without using bedrails?: A Little Help needed moving to and from a bed to a chair (including a wheelchair)?: A Little Help needed standing up from a chair using your arms (e.g., wheelchair or bedside chair)?: A Little Help needed to walk in hospital room?: A Little Help needed climbing 3-5 steps with a railing? : A Lot 6 Click Score: 18    End of Session Equipment Utilized During Treatment: Gait belt;Oxygen Activity Tolerance: Patient tolerated treatment well Patient left: with call bell/phone within reach;in chair;with chair alarm set Nurse Communication: Mobility status PT Visit Diagnosis: Difficulty in walking, not elsewhere classified (R26.2);Unsteadiness on feet (R26.81);Hemiplegia and hemiparesis Hemiplegia - Right/Left: Left Hemiplegia - dominant/non-dominant: Non-dominant Hemiplegia - caused by: Cerebral infarction     Time: 2446-9507 PT Time Calculation (min) (ACUTE ONLY): 30 min  Charges:  $Gait Training: 23-37 mins                     [place]  Pager [phone] Office [place] 10/18/2020, 10:33 AM

## 2020-10-18 NOTE — TOC Initial Note (Signed)
Transition of Care [facility]) - Initial/Assessment Note    Patient Details  Name: Brandon [name] MRN: [id number] Date of Birth: 01/07/1946  [facility]) CM/SW Contact:    Geralynn Ochs, LCSW Phone Number: 10/18/2020, 4:24 PM  Clinical Narrative:     CSW received call back from [facility] with [place] [facility] that the [place] has denied the request for IR, suggesting that the patient would not be appropriate for such intense rehab with his comorbidities and COPD, suggesting SNF placement instead. CSW updated patient and daughter, and will fax SNF request to [place] tomorrow for approval. CSW to follow.              Expected Discharge Plan: Skilled Nursing Facility Barriers to Discharge: Insurance Authorization, SNF Pending bed offer   Patient Goals and CMS Choice Patient states their goals for this hospitalization and ongoing recovery are:: to get rehab CMS Medicare.gov Compare Post Acute Care list provided to:: Patient Choice offered to / list presented to : Patient, Adult Children  Expected Discharge Plan and Services Expected Discharge Plan: [place] Acute Care Choice: [place] Living arrangements for the past 2 months: Single Family Home                                      Prior Living Arrangements/Services Living arrangements for the past 2 months: Single Family Home   Patient language and need for interpreter reviewed:: No Do you feel safe going back to the place where you live?: Yes      Need for Family Participation in Patient Care: No (Comment) Care giver support system in place?: No (comment)   Criminal Activity/Legal Involvement Pertinent to Current Situation/Hospitalization: No - Comment as needed  Activities of Daily Living Home Assistive Devices/Equipment: Dentures (specify type), Cane (specify quad or straight) ADL Screening (condition at time of admission) Patient's cognitive ability adequate to safely complete  daily activities?: Yes Is the patient deaf or have difficulty hearing?: Yes Does the patient have difficulty seeing, even when wearing glasses/contacts?: No Does the patient have difficulty concentrating, remembering, or making decisions?: No Patient able to express need for assistance with ADLs?: No Does the patient have difficulty dressing or bathing?: No Independently performs ADLs?: Yes (appropriate for developmental age) Does the patient have difficulty walking or climbing stairs?: Yes Weakness of Legs: Left Weakness of Arms/Hands: Left  Permission Sought/Granted Permission sought to share information with : Facility Sport and exercise psychologist, Family Supports Permission granted to share information with : Yes, Verbal Permission Granted  Share Information with NAME: Mardene Celeste  Permission granted to share info w AGENCY: SN[facility], [facility]  Permission granted to share info w Relationship: Daughter     Emotional Assessment Appearance:: Appears stated age Attitude/Demeanor/Rapport: Engaged Affect (typically observed): Appropriate Orientation: : Oriented to Self, Oriented to Place, Oriented to  Time, Oriented to Situation Alcohol / Substance Use: Not Applicable Psych Involvement: No (comment)  Admission diagnosis:  Weakness [R53.1] SOB (shortness of breath) [R06.02] COPD exacerbation (HCC) [J44.1] Ischemic stroke [facility]) [I63.9] Patient Active Problem List   Diagnosis Date Noted  . Essential hypertension   . Type 2 diabetes mellitus ([place])   . CAD (coronary artery disease)   . GERD (gastroesophageal reflux disease)   . Acute on chronic respiratory failure with hypoxemia ([place])   . COPD (chronic obstructive pulmonary disease) ([place])   . Tobacco abuse   .  Hyponatremia   . Hyperkalemia   . Ischemic stroke ([place])    PCP:  Pcp, No Pharmacy:   [facility], [place] [place] [address] Phone: [phone] Fax: [phone]     Social  Determinants of Health (SDOH) Interventions    Readmission Risk Interventions No flowsheet data found.

## 2020-10-18 NOTE — Progress Notes (Signed)
PROGRESS NOTE    Brandon [name]  VPX:[id number] DOB: 1946/04/27 DOA: 10/13/2020 PCP: Pcp, No   Brief Narrative:  Brandon [name] is a 74 y.o. male with medical history significant of chronic hypoxemic respiratory failure secondary to underlying COPD-on 3 L of oxygen at home, hypertension, hyperlipidemia, GERD, tobacco abuse, coronary artery disease, type 2 diabetes mellitus, PVD presented to ED with left leg weakness and shortness of breath of 1 day duration. EMS was called and was noted that his oxygen saturation was 86% on 3 L by nasal cannula.  They placed patient on nonrebreather with 10 L supplemental oxygen and his sats improved to 99%.  He reported wheezing, leg swelling, shortness of breath even at rest, orthopnea and unable to stand up due to left leg weakness.  No other complaint.  Upon arrival to ED: Patient tachypneic, hypoxic requiring 4 L of oxygen via nasal cannula, afebrile with no leukocytosis, UA negative for infection, patient was found to be hypoglycemic upon arrival with blood glucose of 53.  Received dextrose 50 and his blood sugar improved to 68 and then 144.  Patient received DuoNeb's, albuterol breathing treatment, Solu-Medrol loading dose, Rocephin and azithromycin in ED.  CT head was obtained which was concerning for ischemic stroke.  Neurology consulted.  Patient was admitted under hospitalist service. MRI brain shows multiple small acute infarcts within both hemispheres, possibly embolic.  Patient was on aspirin.  Plavix has been added.  He was also on Pletal.    Assessment & Plan:   Principal Problem:   Ischemic stroke ([place]) Active Problems:   Essential hypertension   Type 2 diabetes mellitus (HCC)   CAD (coronary artery disease)   GERD (gastroesophageal reflux disease)   Acute on chronic respiratory failure with hypoxemia (HCC)   COPD (chronic obstructive pulmonary disease) (HCC)   Tobacco abuse   Hyponatremia   Hyperkalemia  Acute ischemic stroke: -Patient  presented with left leg weakness started yesterday. CT head shows small right corona radiata acute/subacute stroke.  MRI brain shows multiple small acute infarcts within both hemispheres, possibly embolic.  Patient was on aspirin.  Plavix has been added.  He was also on Pletal.  Patient seen by PT OT.  They recommended CIR. pending insurance authorization.  Will need 30-day event monitor.  I have notified cardiology.  Acute on chronic hypoxemic respiratory failure secondary to acute CHF with preserved ejection fraction: Patient uses 3 L of nasal oxygen at baseline.  He was requiring more than 3 upon admission.  He continues to feel well. Chest x-ray showed vascular congestion.  He received Lasix 40 mg x 1 for first 3 days of admission.  Lungs clear to auscultation but still on 4 L which is kind of his baseline. Transthoracic echo shows normal ejection fraction.  His diagnosis really is acute diastolic congestive heart failure.  He has had good diuresis and his total net negative 5 L.  No need for Lasix anymore.  Was not on any Lasix prior to admission.  Will reassess daily for any needs.  COPD: Stable, not in exacerbation.  Continue home medications.  Essential  hypertension: Stable -Hold home BP meds-amlodipine, enalapril, Aldactone to allow permissive hypertension.  Blood pressure within normal range.  Hyperkalemia: Resolved.  Repeat labs in the morning.  Hyponatremia: Stable.  Repeat labs in the morning.  Iron deficiency anemia: Stable.  Watch daily.  Will initiate on iron pills at the time of discharge.  Hyperlipidemia: Continue atorvastatin.  PVD: Continue aspirin, statin, cilostazol  Type  2 diabetes mellitus: Controlled, I will continue along with SSI.  Ascending thoracic aortic aneurysm: Noted on CTA chest. -Outpatient follow-up recommended.  GERD: Continue PPI  Tobacco abuse: Counseled about cessation provided again today.  Tachycardia: Resolved.  EKG showed sinus  tachycardia yesterday.  Continue home dose of Toprol-XL.  DVT prophylaxis: SCD's Start: 10/13/20 1553   Code Status: Full Code  Family Communication:  None present at bedside.  Plan of care discussed with patient in length and he verbalized understanding and agreed with it.  Status is: Inpatient  Remains inpatient appropriate because:Inpatient level of care appropriate due to severity of illness   Dispo: The patient is from: Home              Anticipated d/c is to: CIR              Anticipated d/c date is: 1 day, waiting for bed and insurance authorization.  He has [employer].  He was offered a bed at our CIR but he does not have financial means to pay daily co-pay.              Patient currently is medically stable to d/c.        Estimated body mass index is 25.24 kg/m as calculated from the following:   Height as of this encounter: 6' (1.829 m).   Weight as of this encounter: 84.4 kg.      Nutritional status:  Nutrition Problem: Inadequate oral intake Etiology: poor appetite   Signs/Symptoms: meal completion < 50%   Interventions: Ensure Enlive (each supplement provides 350kcal and 20 grams of protein), MVI, Magic cup    Consultants:   Neurology  Procedures:   None  Antimicrobials:  Anti-infectives (From admission, onward)   Start     Dose/Rate Route Frequency Ordered Stop   10/14/20 1600  azithromycin (ZITHROMAX) tablet 500 mg  Status:  Discontinued        500 mg Oral Daily 10/13/20 1809 10/14/20 1408   10/13/20 1345  cefTRIAXone (ROCEPHIN) 1 g in sodium chloride 0.9 % 100 mL IVPB        1 g 200 mL/hr over 30 Minutes Intravenous  Once 10/13/20 1343 10/13/20 1605   10/13/20 1345  azithromycin (ZITHROMAX) 500 mg in sodium chloride 0.9 % 250 mL IVPB        500 mg 250 mL/hr over 60 Minutes Intravenous  Once 10/13/20 1343 10/13/20 1649         Subjective: Patient seen and examined.  He has no complaints.  He is frustrated due to the fact that he is  waiting for insurance authorization.  Objective: Vitals:   10/18/20 0005 10/18/20 0440 10/18/20 0752 10/18/20 1156  BP: 120/71 116/88 109/69 (!) 100/59  Pulse: 89  78 88  Resp: 20 16  16   Temp: 97.9 F (36.6 C) 98.6 F (37 C) 97.8 F (36.6 C) 98.1 F (36.7 C)  TempSrc: Oral Oral Oral Oral  SpO2: 99% 100% 100% 97%  Weight:      Height:        Intake/Output Summary (Last 24 hours) at 10/18/2020 1358 Last data filed at 10/18/2020 0800 Gross per 24 hour  Intake 640 ml  Output 500 ml  Net 140 ml   Filed Weights   10/13/20 2109  Weight: 84.4 kg    Examination:  General exam: Appears calm and comfortable  Respiratory system: Clear to auscultation. Respiratory effort normal. Cardiovascular system: S1 & S2 heard, RRR. No JVD, murmurs,  rubs, gallops or clicks. No pedal edema. Gastrointestinal system: Abdomen is nondistended, soft and nontender. No organomegaly or masses felt. Normal bowel sounds heard. Central nervous system: Alert and oriented. No focal neurological deficits. Extremities: Symmetric 5 x 5 power. Skin: No rashes, lesions or ulcers.  Psychiatry: Judgement and insight appear normal. Mood & affect appropriate.    Data Reviewed: I have personally reviewed following labs and imaging studies  CBC: Recent Labs  Lab 10/13/20 1215 10/13/20 1332 10/14/20 0126 10/15/20 1450  WBC 8.1  --  4.2 10.0  NEUTROABS 6.4  --   --   --   HGB 10.2* 12.2* 9.6* 10.1*  HCT 34.4* 36.0* 30.7* 33.8*  MCV 74.9*  --  71.6* 72.7*  PLT 329  --  326 269   Basic Metabolic Panel: Recent Labs  Lab 10/13/20 1215 10/13/20 1332 10/14/20 0126  NA 130* 131* 130*  K 5.4* 5.1 4.2  CL 99  --  98  CO2 19*  --  22  GLUCOSE 95  --  267*  BUN 15  --  13  CREATININE 1.05  --  1.10  CALCIUM 9.1  --  8.8*  MG  --   --  1.5*   GFR: Estimated Creatinine Clearance: 63.7 mL/min (by C-G formula based on SCr of 1.1 mg/dL). Liver Function Tests: Recent Labs  Lab 10/13/20 1215  AST 17   ALT 15  ALKPHOS 60  BILITOT 0.7  PROT 6.6  ALBUMIN 3.4*   No results for input(s): LIPASE, AMYLASE in the last 168 hours. No results for input(s): AMMONIA in the last 168 hours. Coagulation Profile: No results for input(s): INR, PROTIME in the last 168 hours. Cardiac Enzymes: No results for input(s): CKTOTAL, CKMB, CKMBINDEX, TROPONINI in the last 168 hours. BNP (last 3 results) No results for input(s): PROBNP in the last 8760 hours. HbA1C: No results for input(s): HGBA1C in the last 72 hours. CBG: Recent Labs  Lab 10/17/20 1201 10/17/20 1647 10/17/20 2119 10/18/20 0608 10/18/20 1154  GLUCAP 140* 161* 181* 151* 94   Lipid Profile: No results for input(s): CHOL, HDL, LDLCALC, TRIG, CHOLHDL, LDLDIRECT in the last 72 hours. Thyroid Function Tests: No results for input(s): TSH, T4TOTAL, FREET4, T3FREE, THYROIDAB in the last 72 hours. Anemia Panel: No results for input(s): VITAMINB12, FOLATE, FERRITIN, TIBC, IRON, RETICCTPCT in the last 72 hours. Sepsis Labs: No results for input(s): PROCALCITON, LATICACIDVEN in the last 168 hours.  Recent Results (from the past 240 hour(s))  Respiratory Panel by RT PCR (Flu A&B, Covid) - Nasopharyngeal Swab     Status: None   Collection Time: 10/13/20 12:22 PM   Specimen: Nasopharyngeal Swab  Result Value Ref Range Status   SARS Coronavirus 2 by RT PCR NEGATIVE NEGATIVE Final    Comment: (NOTE) SARS-CoV-2 target nucleic acids are NOT DETECTED.  The SARS-CoV-2 RNA is generally detectable in upper respiratoy specimens during the acute phase of infection. The lowest concentration of SARS-CoV-2 viral copies this assay can detect is 131 copies/mL. A negative result does not preclude SARS-Cov-2 infection and should not be used as the sole basis for treatment or other patient management decisions. A negative result may occur with  improper specimen collection/handling, submission of specimen other than nasopharyngeal swab, presence of viral  mutation(s) within the areas targeted by this assay, and inadequate number of viral copies (<131 copies/mL). A negative result must be combined with clinical observations, patient history, and epidemiological information. The expected result is Negative.  Fact Sheet for  Patients:  PinkCheek.be  Fact Sheet for Healthcare Providers:  GravelBags.it  This test is no t yet approved or cleared by the [place] FDA and  has been authorized for detection and/or diagnosis of SARS-CoV-2 by FDA under an Emergency Use Authorization (EUA). This EUA will remain  in effect (meaning this test can be used) for the duration of the COVID-19 declaration under Section 564(b)(1) of the Act, 21 U.S.C. section 360bbb-3(b)(1), unless the authorization is terminated or revoked sooner.     Influenza A by PCR NEGATIVE NEGATIVE Final   Influenza B by PCR NEGATIVE NEGATIVE Final    Comment: (NOTE) The Xpert Xpress SARS-CoV-2/FLU/RSV assay is intended as an aid in  the diagnosis of influenza from Nasopharyngeal swab specimens and  should not be used as a sole basis for treatment. Nasal washings and  aspirates are unacceptable for Xpert Xpress SARS-CoV-2/FLU/RSV  testing.  Fact Sheet for Patients: PinkCheek.be  Fact Sheet for Healthcare Providers: GravelBags.it  This test is not yet approved or cleared by the [place] FDA and  has been authorized for detection and/or diagnosis of SARS-CoV-2 by  FDA under an Emergency Use Authorization (EUA). This EUA will remain  in effect (meaning this test can be used) for the duration of the  Covid-19 declaration under Section 564(b)(1) of the Act, 21  U.S.C. section 360bbb-3(b)(1), unless the authorization is  terminated or revoked. Performed at [facility], [place] [address], [place], [place] [address]       Radiology Studies: No  results found.  Scheduled Meds: .  stroke: mapping our early stages of recovery book   Does not apply Once  . aspirin EC  81 mg Oral Daily  . atorvastatin  40 mg Oral QHS  . cilostazol  50 mg Oral BID  . clopidogrel  75 mg Oral Daily  . dextrose  1 ampule Intravenous Once  . feeding supplement  237 mL Oral BID BM  . ferrous sulfate  325 mg Oral TID WC  . insulin aspart  0-15 Units Subcutaneous TID WC  . insulin aspart  0-5 Units Subcutaneous QHS  . insulin aspart protamine- aspart  40 Units Subcutaneous BID WC  . metoprolol succinate  150 mg Oral BH-qamhs  . mometasone-formoterol  2 puff Inhalation BID  . multivitamin with minerals  1 tablet Oral Daily  . nortriptyline  50 mg Oral QHS  . oxybutynin  10 mg Oral Daily  . pantoprazole  40 mg Oral Daily  . sodium zirconium cyclosilicate  10 g Oral Once  . umeclidinium bromide  1 puff Inhalation Daily   Continuous Infusions:   LOS: 5 days   Time spent: 26 minutes   Darliss Cheney, MD [facility]  10/18/2020, 1:58 PM   To contact the attending provider between 7A-7P or the covering provider during after hours 7P-7A, please log into the web site [email or web address].

## 2020-10-18 NOTE — Progress Notes (Signed)
IP rehab admissions - I met with patient today and I called his daughter with his permission.  They would like to pursue [place] authorization for acute inpatient rehab admission.  Case manager/SW are aware.  I do have authorizations for CIR through [facility], but the copay for the patient is $295 a day for the first 6 days.  Patient and daughter say patient is on a fixed income and cannot afford the copays.  I will follow along to be sure patient can go to CIR through his [place] insurance.  [facility] does not have a contract with [facility].  Call me for questions.  [phone]

## 2020-10-19 DIAGNOSIS — I10 Essential (primary) hypertension: Secondary | ICD-10-CM | POA: Diagnosis not present

## 2020-10-19 LAB — GLUCOSE, CAPILLARY
Glucose-Capillary: 157 mg/dL — ABNORMAL HIGH (ref 70–99)
Glucose-Capillary: 71 mg/dL (ref 70–99)
Glucose-Capillary: 177 mg/dL — ABNORMAL HIGH (ref 70–99)
Glucose-Capillary: 136 mg/dL — ABNORMAL HIGH (ref 70–99)

## 2020-10-19 NOTE — NC FL2 (Addendum)
[place] LEVEL OF CARE SCREENING TOOL     IDENTIFICATION  Patient Name: Brandon [name] Birthdate: 26-Apr-1946 Sex: male Admission Date (Current Location): 10/13/2020  [facility] and [place] Number:  Herbalist and Address:  [facility], [place] [address], [place], [place] [address]      Provider Number: [id number]  Attending Physician Name and Address:  Little Ishikawa, MD  Relative Name and Phone Number:       Current Level of Care: Hospital Recommended Level of Care: [place] Prior Approval Number:    Date Approved/Denied:   PASRR Number: [id number]  Discharge Plan: SNF    Current Diagnoses: Patient Active Problem List   Diagnosis Date Noted   Essential hypertension    Type 2 diabetes mellitus ([place])    CAD (coronary artery disease)    GERD (gastroesophageal reflux disease)    Acute on chronic respiratory failure with hypoxemia (HCC)    COPD (chronic obstructive pulmonary disease) (HCC)    Tobacco abuse    Hyponatremia    Hyperkalemia    Ischemic stroke ([place])     Orientation RESPIRATION BLADDER Height & Weight     Self, Time, Situation, Place  O2 (Nasal Cannula 2L) Continent Weight: 186 lb 1.1 oz (84.4 kg) Height:  6' (182.9 cm)  BEHAVIORAL SYMPTOMS/MOOD NEUROLOGICAL BOWEL NUTRITION STATUS      Continent Diet (See DC Summary)  AMBULATORY STATUS COMMUNICATION OF NEEDS Skin   Limited Assist Verbally Normal                       Personal Care Assistance Level of Assistance  Bathing, Feeding, Dressing Bathing Assistance: Limited assistance Feeding assistance: Independent Dressing Assistance: Limited assistance     Functional Limitations Info             SPECIAL CARE FACTORS FREQUENCY  PT (By licensed PT), OT (By licensed OT)     PT Frequency: 5xweek OT Frequency: 5xweek            Contractures Contractures Info: Not present    Additional Factors Info  Code  Status, Allergies Code Status Info: Full Allergies Info: Furosemide Injection, HCTZ (Hydrochlorothiazide)           Current Medications (10/19/2020):  This is the current hospital active medication list Current Facility-Administered Medications  Medication Dose Route Frequency Provider Last Rate Last Admin    stroke: mapping our early stages of recovery book   Does not apply Once Pahwani, Rinka R, MD       acetaminophen (TYLENOL) tablet 650 mg  650 mg Oral Q4H PRN Pahwani, Rinka R, MD       Or   acetaminophen (TYLENOL) 160 MG/5ML solution 650 mg  650 mg Per Tube Q4H PRN Pahwani, Rinka R, MD       Or   acetaminophen (TYLENOL) suppository 650 mg  650 mg Rectal Q4H PRN Pahwani, Rinka R, MD       aspirin EC tablet 81 mg  81 mg Oral Daily Biby, Sharon L, NP   81 mg at 10/19/20 1030   atorvastatin (LIPITOR) tablet 40 mg  40 mg Oral QHS Burnetta Sabin L, NP   40 mg at 10/18/20 2127   cilostazol (PLETAL) tablet 50 mg  50 mg Oral BID Darliss Cheney, MD   50 mg at 10/19/20 1030   clopidogrel (PLAVIX) tablet 75 mg  75 mg Oral Daily Donzetta Starch, NP   75  mg at 10/19/20 1030   dextrose 50 % solution 50 mL  1 ampule Intravenous Once Domenic Moras, PA-C       feeding supplement (ENSURE ENLIVE / ENSURE PLUS) liquid 237 mL  237 mL Oral BID BM Pahwani, Rinka R, MD   237 mL at 10/18/20 1038   ferrous sulfate tablet 325 mg  325 mg Oral TID WC Rosalin Hawking, MD   325 mg at 10/19/20 0819   insulin aspart (novoLOG) injection 0-15 Units  0-15 Units Subcutaneous TID WC Pahwani, Rinka R, MD   3 Units at 10/19/20 0714   insulin aspart (novoLOG) injection 0-5 Units  0-5 Units Subcutaneous QHS Pahwani, Rinka R, MD   2 Units at 10/18/20 2130   insulin aspart protamine- aspart (NOVOLOG MIX 70/30) injection 40 Units  40 Units Subcutaneous BID WC Darliss Cheney, MD   40 Units at 10/19/20 0819   metoprolol succinate (TOPROL-XL) 24 hr tablet 150 mg  150 mg Oral Freddy Finner, Wandra Feinstein, MD   150 mg at 10/19/20  0819   mometasone-formoterol (DULERA) 200-5 MCG/ACT inhaler 2 puff  2 puff Inhalation BID Darliss Cheney, MD   2 puff at 10/19/20 0753   multivitamin with minerals tablet 1 tablet  1 tablet Oral Daily Darliss Cheney, MD   1 tablet at 10/19/20 1030   nortriptyline (PAMELOR) capsule 50 mg  50 mg Oral QHS Pahwani, Einar Grad, MD   50 mg at 10/18/20 2127   oxybutynin (DITROPAN-XL) 24 hr tablet 10 mg  10 mg Oral Daily Pahwani, Einar Grad, MD   10 mg at 10/19/20 1030   pantoprazole (PROTONIX) EC tablet 40 mg  40 mg Oral Daily Pahwani, Einar Grad, MD   40 mg at 10/19/20 1031   sodium zirconium cyclosilicate (LOKELMA) packet 10 g  10 g Oral Once Pahwani, Rinka R, MD       umeclidinium bromide (INCRUSE ELLIPTA) 62.5 MCG/INH 1 puff  1 puff Inhalation Daily Darliss Cheney, MD   1 puff at 10/19/20 0754     Discharge Medications: Please see discharge summary for a list of discharge medications.  Relevant Imaging Results:  Relevant Lab Results:   Additional Information SS# [id number]  Mar[place] Setting, Student-Social Work

## 2020-10-19 NOTE — Progress Notes (Signed)
PROGRESS NOTE    Brandon [name]  WUJ:[id number] DOB: 03-04-1946 DOA: 10/13/2020 PCP: Pcp, No   Brief Narrative:  Brandon [name] is a 74 y.o. male with medical history significant of chronic hypoxemic respiratory failure secondary to underlying COPD-on 3 L of oxygen at home, hypertension, hyperlipidemia, GERD, tobacco abuse, coronary artery disease, type 2 diabetes mellitus, PVD presented to ED with left leg weakness and shortness of breath of 1 day duration. EMS was called and was noted that his oxygen saturation was 86% on 3 L by nasal cannula.  They placed patient on nonrebreather with 10 L supplemental oxygen and his sats improved to 99%.  He reported wheezing, leg swelling, shortness of breath even at rest, orthopnea and unable to stand up due to left leg weakness.  No other complaint. In ED: Patient tachypneic, hypoxic requiring 4 L of oxygen via nasal cannula, afebrile with no leukocytosis, UA negative for infection, patient was found to be hypoglycemic upon arrival with blood glucose of 53.  Received dextrose 50 and his blood sugar improved to 68 and then 144.  Patient received DuoNeb's, albuterol breathing treatment, Solu-Medrol loading dose, Rocephin and azithromycin in ED.  CT head was obtained which was concerning for ischemic stroke.  Neurology consulted.  Patient was admitted under hospitalist service. MRI brain shows multiple small acute infarcts within both hemispheres, possibly embolic.  Patient was on aspirin.  Plavix has been added.  He was also on Pletal.    Assessment & Plan:   Principal Problem:   Ischemic stroke ([name]) Active Problems:   Essential hypertension   Type 2 diabetes mellitus (HCC)   CAD (coronary artery disease)   GERD (gastroesophageal reflux disease)   Acute on chronic respiratory failure with hypoxemia (HCC)   COPD (chronic obstructive pulmonary disease) (HCC)   Tobacco abuse   Hyponatremia   Hyperkalemia  Acute ischemic stroke: -Patient presented with  left leg weakness started yesterday. CT head shows small right corona radiata acute/subacute stroke.  MRI brain shows multiple small acute infarcts within both hemispheres, possibly embolic.  Patient was on aspirin.  Plavix has been added.  He was also on Pletal.  Patient seen by PT OT.  They recommended CIR. pending insurance authorization.  Will need 30-day event monitor.  Cardiology aware.  Acute on chronic hypoxemic respiratory failure secondary to acute CHF with preserved ejection fraction: Patient uses 3 L of nasal oxygen at baseline.  He was requiring more than 3 upon admission.  He continues to feel well. Chest x-ray showed vascular congestion.  He received Lasix 40 mg x 1 for first 3 days of admission.  Lungs clear to auscultation but still on 4 L which is kind of his baseline. Transthoracic echo shows normal ejection fraction.  His diagnosis really is acute diastolic congestive heart failure.  He has had good diuresis and his total net negative 5 L.  No need for Lasix anymore.  Was not on any Lasix prior to admission.  Will reassess daily for any needs.  COPD: Stable, not in exacerbation.  Continue home medications.  Essential  hypertension: Stable -Hold home BP meds-amlodipine, enalapril, Aldactone to allow permissive hypertension.  Blood pressure within normal range.  Hyperkalemia: Resolved.  Repeat labs in the morning.  Hyponatremia: Stable.  Repeat labs in the morning.  Chronic iron deficiency anemia: Stable.  Watch daily.  Will initiate on iron pills at the time of discharge.  Hyperlipidemia: Continue atorvastatin.  PVD: Continue aspirin, statin, cilostazol  Type 2 diabetes mellitus: Controlled,  I will continue along with SSI.  Ascending thoracic aortic aneurysm: Noted on CTA chest. -Outpatient follow-up recommended.  GERD: Continue PPI  Tobacco abuse: Counseled about cessation provided again today.   DVT prophylaxis: SCD's Start: 10/13/20 1553   Code Status:  Full Code  Family Communication:  None present at bedside.  Plan of care discussed with patient in length and he verbalized understanding and agreed with it.  Status is: Inpatient  Remains inpatient appropriate because:Inpatient level of care appropriate due to severity of illness   Dispo: The patient is from: Home              Anticipated d/c is to: CIR              Anticipated d/c date is: 1 day, waiting for bed and insurance authorization.  He has [employer].  He was offered a bed at our CIR but he does not have financial means to pay daily co-pay.              Patient currently is medically stable to d/c.   Estimated body mass index is 25.24 kg/m as calculated from the following:   Height as of this encounter: 6' (1.829 m).   Weight as of this encounter: 84.4 kg.  Nutritional status: Nutrition Problem: Inadequate oral intake Etiology: poor appetite Signs/Symptoms: meal completion < 50% Interventions: Ensure Enlive (each supplement provides 350kcal and 20 grams of protein), MVI, Magic cup  Consultants:   Neurology  Procedures:   None  Antimicrobials:  Anti-infectives (From admission, onward)   Start     Dose/Rate Route Frequency Ordered Stop   10/14/20 1600  azithromycin (ZITHROMAX) tablet 500 mg  Status:  Discontinued        500 mg Oral Daily 10/13/20 1809 10/14/20 1408   10/13/20 1345  cefTRIAXone (ROCEPHIN) 1 g in sodium chloride 0.9 % 100 mL IVPB        1 g 200 mL/hr over 30 Minutes Intravenous  Once 10/13/20 1343 10/13/20 1605   10/13/20 1345  azithromycin (ZITHROMAX) 500 mg in sodium chloride 0.9 % 250 mL IVPB        500 mg 250 mL/hr over 60 Minutes Intravenous  Once 10/13/20 1343 10/13/20 1649         Subjective: No acute issues or events overnight, looking forward to discharge into physical therapy and subsequently discharged home, otherwise denies chest pain, shortness of breath, nausea, vomiting, diarrhea, constipation, headache, fevers,  chills.  Objective: Vitals:   10/18/20 1626 10/18/20 2040 10/19/20 0017 10/19/20 0418  BP: (!) 108/59 (!) 111/58 104/61 100/62  Pulse: 98 94 90 88  Resp: 16 17 20 20   Temp: 98.7 F (37.1 C) 98.4 F (36.9 C) 99.6 F (37.6 C) 98.6 F (37 C)  TempSrc: Oral Oral Oral   SpO2: 98% 93% 94% 98%  Weight:      Height:        Intake/Output Summary (Last 24 hours) at 10/19/2020 0728 Last data filed at 10/18/2020 1700 Gross per 24 hour  Intake 900 ml  Output 200 ml  Net 700 ml   Filed Weights   10/13/20 2109  Weight: 84.4 kg    Examination:  General exam: Appears calm and comfortable  Respiratory system: Clear to auscultation. Respiratory effort normal. Cardiovascular system: S1 & S2 heard, RRR. No JVD, murmurs, rubs, gallops or clicks. No pedal edema. Gastrointestinal system: Abdomen is nondistended, soft and nontender. No organomegaly or masses felt. Normal bowel sounds heard. Central nervous  system: Alert and oriented. No focal neurological deficits. Extremities: Symmetric 5 x 5 power. Skin: No rashes, lesions or ulcers.  Psychiatry: Judgement and insight appear normal. Mood & affect appropriate.    Data Reviewed: I have personally reviewed following labs and imaging studies  CBC: Recent Labs  Lab 10/13/20 1215 10/13/20 1332 10/14/20 0126 10/15/20 1450  WBC 8.1  --  4.2 10.0  NEUTROABS 6.4  --   --   --   HGB 10.2* 12.2* 9.6* 10.1*  HCT 34.4* 36.0* 30.7* 33.8*  MCV 74.9*  --  71.6* 72.7*  PLT 329  --  326 017   Basic Metabolic Panel: Recent Labs  Lab 10/13/20 1215 10/13/20 1332 10/14/20 0126  NA 130* 131* 130*  K 5.4* 5.1 4.2  CL 99  --  98  CO2 19*  --  22  GLUCOSE 95  --  267*  BUN 15  --  13  CREATININE 1.05  --  1.10  CALCIUM 9.1  --  8.8*  MG  --   --  1.5*   GFR: Estimated Creatinine Clearance: 63.7 mL/min (by C-G formula based on SCr of 1.1 mg/dL). Liver Function Tests: Recent Labs  Lab 10/13/20 1215  AST 17  ALT 15  ALKPHOS 60  BILITOT  0.7  PROT 6.6  ALBUMIN 3.4*   No results for input(s): LIPASE, AMYLASE in the last 168 hours. No results for input(s): AMMONIA in the last 168 hours. Coagulation Profile: No results for input(s): INR, PROTIME in the last 168 hours. Cardiac Enzymes: No results for input(s): CKTOTAL, CKMB, CKMBINDEX, TROPONINI in the last 168 hours. BNP (last 3 results) No results for input(s): PROBNP in the last 8760 hours. HbA1C: No results for input(s): HGBA1C in the last 72 hours. CBG: Recent Labs  Lab 10/18/20 0608 10/18/20 1154 10/18/20 1623 10/18/20 2116 10/19/20 0642  GLUCAP 151* 94 179* 235* 157*   Lipid Profile: No results for input(s): CHOL, HDL, LDLCALC, TRIG, CHOLHDL, LDLDIRECT in the last 72 hours. Thyroid Function Tests: No results for input(s): TSH, T4TOTAL, FREET4, T3FREE, THYROIDAB in the last 72 hours. Anemia Panel: No results for input(s): VITAMINB12, FOLATE, FERRITIN, TIBC, IRON, RETICCTPCT in the last 72 hours. Sepsis Labs: No results for input(s): PROCALCITON, LATICACIDVEN in the last 168 hours.  Recent Results (from the past 240 hour(s))  Respiratory Panel by RT PCR (Flu A&B, Covid) - Nasopharyngeal Swab     Status: None   Collection Time: 10/13/20 12:22 PM   Specimen: Nasopharyngeal Swab  Result Value Ref Range Status   SARS Coronavirus 2 by RT PCR NEGATIVE NEGATIVE Final    Comment: (NOTE) SARS-CoV-2 target nucleic acids are NOT DETECTED.  The SARS-CoV-2 RNA is generally detectable in upper respiratoy specimens during the acute phase of infection. The lowest concentration of SARS-CoV-2 viral copies this assay can detect is 131 copies/mL. A negative result does not preclude SARS-Cov-2 infection and should not be used as the sole basis for treatment or other patient management decisions. A negative result may occur with  improper specimen collection/handling, submission of specimen other than nasopharyngeal swab, presence of viral mutation(s) within the areas  targeted by this assay, and inadequate number of viral copies (<131 copies/mL). A negative result must be combined with clinical observations, patient history, and epidemiological information. The expected result is Negative.  Fact Sheet for Patients:  PinkCheek.be  Fact Sheet for Healthcare Providers:  GravelBags.it  This test is no t yet approved or cleared by the [place] FDA  and  has been authorized for detection and/or diagnosis of SARS-CoV-2 by FDA under an Emergency Use Authorization (EUA). This EUA will remain  in effect (meaning this test can be used) for the duration of the COVID-19 declaration under Section 564(b)(1) of the Act, 21 U.S.C. section 360bbb-3(b)(1), unless the authorization is terminated or revoked sooner.     Influenza A by PCR NEGATIVE NEGATIVE Final   Influenza B by PCR NEGATIVE NEGATIVE Final    Comment: (NOTE) The Xpert Xpress SARS-CoV-2/FLU/RSV assay is intended as an aid in  the diagnosis of influenza from Nasopharyngeal swab specimens and  should not be used as a sole basis for treatment. Nasal washings and  aspirates are unacceptable for Xpert Xpress SARS-CoV-2/FLU/RSV  testing.  Fact Sheet for Patients: PinkCheek.be  Fact Sheet for Healthcare Providers: GravelBags.it  This test is not yet approved or cleared by the [place] FDA and  has been authorized for detection and/or diagnosis of SARS-CoV-2 by  FDA under an Emergency Use Authorization (EUA). This EUA will remain  in effect (meaning this test can be used) for the duration of the  Covid-19 declaration under Section 564(b)(1) of the Act, 21  U.S.C. section 360bbb-3(b)(1), unless the authorization is  terminated or revoked. Performed at [facility], [place] [address], [place], [place] [address]       Radiology Studies: No results found.  Scheduled  Meds:   stroke: mapping our early stages of recovery book   Does not apply Once   aspirin EC  81 mg Oral Daily   atorvastatin  40 mg Oral QHS   cilostazol  50 mg Oral BID   clopidogrel  75 mg Oral Daily   dextrose  1 ampule Intravenous Once   feeding supplement  237 mL Oral BID BM   ferrous sulfate  325 mg Oral TID WC   insulin aspart  0-15 Units Subcutaneous TID WC   insulin aspart  0-5 Units Subcutaneous QHS   insulin aspart protamine- aspart  40 Units Subcutaneous BID WC   metoprolol succinate  150 mg Oral BH-qamhs   mometasone-formoterol  2 puff Inhalation BID   multivitamin with minerals  1 tablet Oral Daily   nortriptyline  50 mg Oral QHS   oxybutynin  10 mg Oral Daily   pantoprazole  40 mg Oral Daily   sodium zirconium cyclosilicate  10 g Oral Once   umeclidinium bromide  1 puff Inhalation Daily   Continuous Infusions:   LOS: 6 days   Time spent: 26 minutes  Little Ishikawa, DO [facility]  10/19/2020, 7:28 AM   To contact the attending provider between 7A-7P or the covering provider during after hours 7P-7A, please log into the web site [email or web address].

## 2020-10-19 NOTE — Progress Notes (Signed)
Occupational Therapy Treatment Patient Details Name: Brandon [name] MRN: [id number] DOB: 20-Feb-1946 Today's Date: 10/19/2020    History of present illness Patient is a 74 y/o male who presents with left sided weakness and SOB. 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  Also with COPD exacberation. PMH includes chronic hypoxemic respiratory failure secondary to underlying COPD-on 3 L of oxygen at home, HTN, HLD, tobacco abuse, CAD, DM, PVD   OT comments  Patient agreeable to functional transfers this session. Patient min A with functional ambulation to bathroom using rolling walker and 1 occasion of near LE scissoring while turing around sink. Patient min A with cues to utilize grab bar for toilet transfer. Patient min A with shower transfer with cues to sequence. Patient agreeable to sitting in chair at end of session, demonstrates adequate grip and motor coordination to pour water from pitcher and place lid on cup.   Follow Up Recommendations  CIR    Equipment Recommendations  3 in 1 bedside commode;Tub/shower bench       Precautions / Restrictions Precautions Precautions: Fall;Other (comment) Precaution Comments: watch 02 Restrictions Weight Bearing Restrictions: No       Mobility Bed Mobility               General bed mobility comments: seated EOB  Transfers Overall transfer level: Needs assistance Equipment used: Rolling walker (2 wheeled) Transfers: Sit to/from Stand Sit to Stand: Min assist         General transfer comment: min A for steadying and cues for hand placement with toilet transfer to utilize grab bar    Balance Overall balance assessment: Needs assistance Sitting-balance support: Feet supported;No upper extremity supported Sitting balance-Leahy Scale: Good     Standing balance support: Bilateral upper extremity supported Standing balance-Leahy Scale: Poor                              ADL either performed or assessed with clinical judgement   ADL Overall ADL's : Needs assistance/impaired Eating/Feeding: Independent Eating/Feeding Details (indicate cue type and reason): patient able to pour water from pitcher into cup without assist                 Lower Body Dressing: Set up;Sitting/lateral leans Lower Body Dressing Details (indicate cue type and reason): to don slippers, able to bend over and slide heels in with fingers Toilet Transfer: Minimal assistance;Cueing for safety;Regular Toilet;Ambulation;RW;Grab bars Toilet Transfer Details (indicate cue type and reason): cues to pull from grab bar to stand, min A for steadying  Toileting- Clothing Manipulation and Hygiene: Supervision/safety;Sitting/lateral lean   Tub/ Shower Transfer: Minimal assistance;Cueing for safety;Cueing for sequencing;Ambulation;Grab bars;Rolling walker Tub/Shower Transfer Details (indicate cue type and reason): instruct patient in how to sequence reaching for grab bar and stepping in one leg at a time over shower threshold, min A for steadying throughout functional transfer, patient follow instructions well  Functional mobility during ADLs: Minimal assistance;Rolling walker General ADL Comments: today's session focusing on functional transfer training patient requiring min A throughout due to unsteadiness and LE near scissoring observed x1               Cognition Arousal/Alertness: Awake/alert Behavior During Therapy: Flat affect Overall Cognitive Status: Within Functional Limits for tasks assessed  Pertinent Vitals/ Pain       Pain Assessment: No/denies pain         Frequency  Min 2X/week        Progress Toward Goals  OT Goals(current goals can now be found in the care plan section)  Progress towards OT goals: Progressing toward goals  Acute Rehab OT Goals Patient Stated Goal: to get back  to normal  OT Goal Formulation: With patient Time For Goal Achievement: 10/30/20 Potential to Achieve Goals: Good  Plan Discharge plan remains appropriate       AM-PAC OT "6 Clicks" Daily Activity     Outcome Measure   Help from another person eating meals?: None Help from another person taking care of personal grooming?: A Little Help from another person toileting, which includes using toliet, bedpan, or urinal?: A Little Help from another person bathing (including washing, rinsing, drying)?: A Little Help from another person to put on and taking off regular upper body clothing?: A Little Help from another person to put on and taking off regular lower body clothing?: A Little 6 Click Score: 19    End of Session Equipment Utilized During Treatment: Rolling walker;Gait belt;Oxygen  OT Visit Diagnosis: Unsteadiness on feet (R26.81);Hemiplegia and hemiparesis Hemiplegia - Right/Left: Left Hemiplegia - dominant/non-dominant: Non-Dominant Hemiplegia - caused by: Cerebral infarction   Activity Tolerance Patient tolerated treatment well   Patient Left in chair;with call bell/phone within reach;with chair alarm set   Nurse Communication Mobility status        Time: 2122-4825 OT Time Calculation (min): 19 min  Charges: OT General Charges $OT Visit: 1 Visit OT Treatments $Self Care/Home Management : 8-22 mins  Delbert Phenix OT OT pager: [place] 10/19/2020, 11:59 AM

## 2020-10-19 NOTE — Progress Notes (Signed)
Physical Therapy Treatment Patient Details Name: Brandon [name] MRN: [id number] DOB: 02-18-1946 Today's Date: 10/19/2020    History of Present Illness Patient is a 74 y/o male who presents with left sided weakness and SOB. 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  Also with COPD exacberation. PMH includes chronic hypoxemic respiratory failure secondary to underlying COPD-on 3 L of oxygen at home, HTN, HLD, tobacco abuse, CAD, DM, PVD    PT Comments    Pt tolerates treatment well, ambulating for increased distances. Pt does continue to demonstrate LE weakness and requires some physical assistance to transfer from lower surfaces. PT continues to recommend SNF placement at this time as the pt has no caregiver support identified based on chart review and remains at an increased risk for falls due to LE weakness and impaired functional mobility.   Follow Up Recommendations  Supervision for mobility/OOB;SNF     Equipment Recommendations  Rolling walker with 5" wheels    Recommendations for Other Services       Precautions / Restrictions Precautions Precautions: Fall;Other (comment) Precaution Comments: watch 02 Restrictions Weight Bearing Restrictions: No    Mobility  Bed Mobility                  Transfers Overall transfer level: Needs assistance Equipment used: Rolling walker (2 wheeled) Transfers: Sit to/from Stand Sit to Stand: Min assist;Supervision         General transfer comment: minA to stand from low chair, minG to control descent in stand to sit. Pt then completes 5x sit to stand from edge of bed with supervision  Ambulation/Gait Ambulation/Gait assistance: Min guard Gait Distance (Feet): 120 Feet Assistive device: Rolling walker (2 wheeled) Gait Pattern/deviations: Step-to pattern Gait velocity: reduced Gait velocity interpretation: <1.8 ft/sec, indicate of risk for recurrent falls General Gait  Details: pt with step to gait, reduced foot clearance of LLE initially but improves with continued ambulation   Stairs             Wheelchair Mobility    Modified Rankin (Stroke Patients Only) Modified Rankin (Stroke Patients Only) Pre-Morbid Rankin Score: Slight disability Modified Rankin: Moderately severe disability     Balance Overall balance assessment: Needs assistance Sitting-balance support: No upper extremity supported;Feet supported Sitting balance-Leahy Scale: Good     Standing balance support: Single extremity supported;Bilateral upper extremity supported Standing balance-Leahy Scale: Poor Standing balance comment: reliant on UE support of RW                            Cognition Arousal/Alertness: Awake/alert Behavior During Therapy: Flat affect Overall Cognitive Status: Within Functional Limits for tasks assessed                                        Exercises      General Comments General comments (skin integrity, edema, etc.): pt reports some SOB after 100' of ambulation while on 3L , PT unable to obtain reliable pulse ox reading, RN reports this has been an issue for nursing staff as well      Pertinent Vitals/Pain Pain Assessment: No/denies pain    Home Living                      Prior Function  PT Goals (current goals can now be found in the care plan section) Acute Rehab PT Goals Patient Stated Goal: to get back to normal  Progress towards PT goals: Progressing toward goals    Frequency    Min 4X/week      PT Plan Current plan remains appropriate    Co-evaluation              AM-PAC PT "6 Clicks" Mobility   Outcome Measure  Help needed turning from your back to your side while in a flat bed without using bedrails?: None Help needed moving from lying on your back to sitting on the side of a flat bed without using bedrails?: None Help needed moving to and from a bed to  a chair (including a wheelchair)?: A Little Help needed standing up from a chair using your arms (e.g., wheelchair or bedside chair)?: A Little Help needed to walk in hospital room?: A Little Help needed climbing 3-5 steps with a railing? : A Lot 6 Click Score: 19    End of Session Equipment Utilized During Treatment: Gait belt;Oxygen Activity Tolerance: Patient tolerated treatment well Patient left: with call bell/phone within reach;in bed;with bed alarm set Nurse Communication: Mobility status PT Visit Diagnosis: Difficulty in walking, not elsewhere classified (R26.2);Unsteadiness on feet (R26.81);Hemiplegia and hemiparesis Hemiplegia - Right/Left: Left Hemiplegia - dominant/non-dominant: Non-dominant Hemiplegia - caused by: Cerebral infarction     Time: 8453-6468 PT Time Calculation (min) (ACUTE ONLY): 17 min  Charges:  $Gait Training: 8-22 mins                     Zenaida Niece, PT, DPT Acute Rehabilitation Pager: [phone]    Zenaida Niece 10/19/2020, 4:38 PM

## 2020-10-20 DIAGNOSIS — I10 Essential (primary) hypertension: Secondary | ICD-10-CM | POA: Diagnosis not present

## 2020-10-20 LAB — GLUCOSE, CAPILLARY
Glucose-Capillary: 90 mg/dL (ref 70–99)
Glucose-Capillary: 197 mg/dL — ABNORMAL HIGH (ref 70–99)
Glucose-Capillary: 214 mg/dL — ABNORMAL HIGH (ref 70–99)
Glucose-Capillary: 215 mg/dL — ABNORMAL HIGH (ref 70–99)

## 2020-10-20 NOTE — Progress Notes (Signed)
[facility] IP rehab admissions - noted denial for CIR from [place] and plans in process for SNF through the [place].  I will continue to follow along.  Call for questions.  [phone]

## 2020-10-20 NOTE — TOC Progression Note (Signed)
Transition of Care [facility]) - Progression Note    Patient Details  Name: Swade Shonka MRN: [id number] Date of Birth: 12-16-1946  Transition of Care [facility]) CM/SW [place],  Phone Number: 10/20/2020, 2:42 PM  Clinical Narrative:   CSW received call from the [place] that they are not going to approve SNF, the patient will need to utilize his [employer] benefits first. CSW explained to patient's daughter, and discussed bed offers for SNF using [employer]. Daughter discussed with the patient, and patient prefers either [facility], [facility], or [facility]. CSW asked [facility] to review referral, and they do not have any beds available for unvaccinated patients. [facility] is able to offer and can admit the patient tomorrow, pending insurance approval. Insurance request has been faxed to [facility] for [employer]. CSW to follow.    Expected Discharge Plan: Skilled Nursing Facility Barriers to Discharge: Ship broker, SNF Pending bed offer  Expected Discharge Plan and Services Expected Discharge Plan: [place] Choice: [place] arrangements for the past 2 months: Single Family Home                                       Social Determinants of Health (SDOH) Interventions    Readmission Risk Interventions No flowsheet data found.

## 2020-10-20 NOTE — Progress Notes (Signed)
Physical Therapy Treatment Patient Details Name: Brandon [name] MRN: [id number] DOB: Sep 02, 1946 Today's Date: 10/20/2020    History of Present Illness Patient is a 74 y/o male who presents with left sided weakness and SOB. 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  Also with COPD exacberation. PMH includes chronic hypoxemic respiratory failure secondary to underlying COPD-on 3 L of oxygen at home, HTN, HLD, tobacco abuse, CAD, DM, PVD    PT Comments    Pt with increased fatigue today, tolerating reduced ambulation distances this session compared to previous days. Pt continues to demonstrate increased work of breathing with activity. Due to endurance deficits pt would have great difficulty managing ADLs and IADLs safely throughout the day. Pt will continue to benefit from acute PT POC to improve activity tolerance and restore independence in mobility. PT continues to recommend SNF placement at this time.   Follow Up Recommendations  SNF;Supervision for mobility/OOB     Equipment Recommendations  Rolling walker with 5" wheels    Recommendations for Other Services       Precautions / Restrictions Precautions Precautions: Fall;Other (comment) Precaution Comments: watch 02 Restrictions Weight Bearing Restrictions: No    Mobility  Bed Mobility                  Transfers Overall transfer level: Needs assistance Equipment used: Rolling walker (2 wheeled) Transfers: Sit to/from Stand Sit to Stand: Supervision            Ambulation/Gait Ambulation/Gait assistance: Min guard Gait Distance (Feet): 80 Feet Assistive device: Rolling walker (2 wheeled) Gait Pattern/deviations: Step-to pattern Gait velocity: reduced Gait velocity interpretation: <1.8 ft/sec, indicate of risk for recurrent falls General Gait Details: pt with slowed step-to gait, increased trunk flexion   Stairs             Wheelchair  Mobility    Modified Rankin (Stroke Patients Only) Modified Rankin (Stroke Patients Only) Pre-Morbid Rankin Score: Slight disability Modified Rankin: Moderately severe disability     Balance Overall balance assessment: Needs assistance Sitting-balance support: No upper extremity supported;Feet supported Sitting balance-Leahy Scale: Good     Standing balance support: No upper extremity supported Standing balance-Leahy Scale: Fair                              Cognition Arousal/Alertness: Awake/alert Behavior During Therapy: Flat affect Overall Cognitive Status: Within Functional Limits for tasks assessed                                        Exercises      General Comments General comments (skin integrity, edema, etc.): pt reports increased fatigue this session. pt found to have removed nasal canula upon PT arrival, PT returns pt to 3L  with mobility as this is what the pt wears at baseline. Pt with increased work of breathing noted at end of mobility      Pertinent Vitals/Pain Pain Assessment: No/denies pain    Home Living                      Prior Function            PT Goals (current goals can now be found in the care plan section) Acute Rehab PT Goals Patient Stated Goal: to get back to normal  Progress towards PT goals: Progressing toward goals    Frequency    Min 3X/week      PT Plan Frequency needs to be updated    Co-evaluation              AM-PAC PT "6 Clicks" Mobility   Outcome Measure  Help needed turning from your back to your side while in a flat bed without using bedrails?: None Help needed moving from lying on your back to sitting on the side of a flat bed without using bedrails?: None Help needed moving to and from a bed to a chair (including a wheelchair)?: None Help needed standing up from a chair using your arms (e.g., wheelchair or bedside chair)?: None Help needed to walk in hospital  room?: A Little Help needed climbing 3-5 steps with a railing? : A Lot 6 Click Score: 21    End of Session Equipment Utilized During Treatment: Oxygen Activity Tolerance: Patient tolerated treatment well Patient left: in chair;with call bell/phone within reach;with chair alarm set Nurse Communication: Mobility status PT Visit Diagnosis: Difficulty in walking, not elsewhere classified (R26.2);Unsteadiness on feet (R26.81);Hemiplegia and hemiparesis Hemiplegia - Right/Left: Left Hemiplegia - dominant/non-dominant: Non-dominant Hemiplegia - caused by: Cerebral infarction     Time: 0518-3358 PT Time Calculation (min) (ACUTE ONLY): 12 min  Charges:  $Gait Training: 8-22 mins                     Zenaida Niece, PT, DPT Acute Rehabilitation Pager: [phone]    Zenaida Niece 10/20/2020, 4:51 PM

## 2020-10-20 NOTE — TOC Progression Note (Signed)
Transition of Care [facility]) - Progression Note    Patient Details  Name: Brandon [name] MRN: [id number] Date of Birth: 03-03-1946  Transition of Care [facility]) CM/SW [place], [place] Phone Number: 10/20/2020, 2:42 PM  Clinical Narrative:   CSW sent SNF request to the [facility] for approval, awaiting response. CSW to follow.    Expected Discharge Plan: Skilled Nursing Facility Barriers to Discharge: Ship broker, SNF Pending bed offer  Expected Discharge Plan and Services Expected Discharge Plan: [place] Choice: [place] arrangements for the past 2 months: Single Family Home                                       Social Determinants of Health (SDOH) Interventions    Readmission Risk Interventions No flowsheet data found.

## 2020-10-20 NOTE — Progress Notes (Signed)
PROGRESS NOTE    Brandon [name]  SAY:[id number] DOB: 1946/11/19 DOA: 10/13/2020 PCP: Pcp, No   Brief Narrative:  Brandon [name] is a 74 y.o. male with medical history significant of chronic hypoxemic respiratory failure secondary to underlying COPD-on 3 L of oxygen at home, hypertension, hyperlipidemia, GERD, tobacco abuse, coronary artery disease, type 2 diabetes mellitus, PVD presented to ED with left leg weakness and shortness of breath of 1 day duration. EMS was called and was noted that his oxygen saturation was 86% on 3 L by nasal cannula.  They placed patient on nonrebreather with 10 L supplemental oxygen and his sats improved to 99%.  He reported wheezing, leg swelling, shortness of breath even at rest, orthopnea and unable to stand up due to left leg weakness.  No other complaint. In ED: Patient tachypneic, hypoxic requiring 4 L of oxygen via nasal cannula, afebrile with no leukocytosis, UA negative for infection, patient was found to be hypoglycemic upon arrival with blood glucose of 53.  Received dextrose 50 and his blood sugar improved to 68 and then 144.  Patient received DuoNeb's, albuterol breathing treatment, Solu-Medrol loading dose, Rocephin and azithromycin in ED.  CT head was obtained which was concerning for ischemic stroke.  Neurology consulted.  Patient was admitted under hospitalist service. MRI brain shows multiple small acute infarcts within both hemispheres, possibly embolic.  Patient was on aspirin.  Plavix has been added.  He was also on Pletal.    Assessment & Plan:   Principal Problem:   Ischemic stroke ([place]) Active Problems:   Essential hypertension   Type 2 diabetes mellitus (HCC)   CAD (coronary artery disease)   GERD (gastroesophageal reflux disease)   Acute on chronic respiratory failure with hypoxemia (HCC)   COPD (chronic obstructive pulmonary disease) (HCC)   Tobacco abuse   Hyponatremia   Hyperkalemia  Acute ischemic stroke: -Patient presented with  left leg weakness.  - CT head shows small right corona radiata acute/subacute stroke.  - MRI brain shows multiple small acute infarcts within both hemispheres, possibly embolic.   - Patient was on aspirin; Plavix has been added.  He was also on Pletal.   - Patient seen by PT OT.  They recommended CIR. pending insurance authorization.  Will need 30-day event monitor.  Cardiology aware.  Acute on chronic hypoxemic respiratory failure secondary to acute CHF with preserved ejection fraction:  - Patient uses 3 L of nasal oxygen at baseline.  He was requiring more than 3 upon admission.  - Chest x-ray showed vascular congestion.  He received Lasix and improved drastically - Transthoracic echo shows normal ejection fraction.   - Not on diuretics at home per chart review  COPD: Stable, not in exacerbation.  Continue home medications.  Essential  hypertension: Stable -Hold home BP meds-amlodipine, enalapril, Aldactone to allow permissive hypertension.  Blood pressure within normal range.  Hyperkalemia: Resolved.  Repeat labs in the morning.  Hyponatremia: Stable.  Repeat labs in the morning.  Chronic iron deficiency anemia: Stable.  Watch daily.  Will initiate on iron pills at the time of discharge.  Hyperlipidemia: Continue atorvastatin.  PVD: Continue aspirin, statin, cilostazol  Type 2 diabetes mellitus: Controlled, I will continue along with SSI.  Ascending thoracic aortic aneurysm:  - Noted on CTA chest. -Outpatient follow-up recommended.  GERD: Continue PPI  Tobacco abuse: Counseled about cessation provided again today.   DVT prophylaxis: SCD's Start: 10/13/20 1553   Code Status: Full Code  Family Communication:  None present at  bedside.  Plan of care discussed with patient in length and he verbalized understanding and agreed with it.  Status is: Inpatient  Remains inpatient appropriate because:Inpatient level of care appropriate due to severity of  illness   Dispo: The patient is from: Home              Anticipated d/c is to: CIR              Anticipated d/c date is: 1 day, waiting for bed and insurance authorization.  He has [employer].  He was offered a bed at our CIR but he does not have financial means to pay daily co-pay.              Patient currently is medically stable to d/c.   Estimated body mass index is 25.24 kg/m as calculated from the following:   Height as of this encounter: 6' (1.829 m).   Weight as of this encounter: 84.4 kg.  Nutritional status: Nutrition Problem: Inadequate oral intake Etiology: poor appetite Signs/Symptoms: meal completion < 50% Interventions: Ensure Enlive (each supplement provides 350kcal and 20 grams of protein), MVI, Magic cup  Consultants:   Neurology  Procedures:   None  Antimicrobials:  Anti-infectives (From admission, onward)   Start     Dose/Rate Route Frequency Ordered Stop   10/14/20 1600  azithromycin (ZITHROMAX) tablet 500 mg  Status:  Discontinued        500 mg Oral Daily 10/13/20 1809 10/14/20 1408   10/13/20 1345  cefTRIAXone (ROCEPHIN) 1 g in sodium chloride 0.9 % 100 mL IVPB        1 g 200 mL/hr over 30 Minutes Intravenous  Once 10/13/20 1343 10/13/20 1605   10/13/20 1345  azithromycin (ZITHROMAX) 500 mg in sodium chloride 0.9 % 250 mL IVPB        500 mg 250 mL/hr over 60 Minutes Intravenous  Once 10/13/20 1343 10/13/20 1649         Subjective: No acute issues or events overnight, looking forward to discharge into physical therapy and subsequently discharged home, otherwise denies chest pain, shortness of breath, nausea, vomiting, diarrhea, constipation, headache, fevers, chills.  Objective: Vitals:   10/19/20 1958 10/19/20 2028 10/19/20 2332 10/20/20 0357  BP:  116/66 111/72 112/61  Pulse:  93 86 87  Resp:    18  Temp:  98 F (36.7 C) 98.2 F (36.8 C) 97.9 F (36.6 C)  TempSrc:  Oral Axillary Oral  SpO2: 100% 100% 96% 98%  Weight:      Height:        No intake or output data in the 24 hours ending 10/20/20 0734 Filed Weights   10/13/20 2109  Weight: 84.4 kg    Examination:  General exam: Appears calm and comfortable  Respiratory system: Clear to auscultation. Respiratory effort normal. Cardiovascular system: S1 & S2 heard, RRR. No JVD, murmurs, rubs, gallops or clicks. No pedal edema. Gastrointestinal system: Abdomen is nondistended, soft and nontender. No organomegaly or masses felt. Normal bowel sounds heard. Central nervous system: Alert and oriented. No focal neurological deficits. Extremities: Symmetric 5 x 5 power. Skin: No rashes, lesions or ulcers.  Psychiatry: Judgement and insight appear normal. Mood & affect appropriate.    Data Reviewed: I have personally reviewed following labs and imaging studies  CBC: Recent Labs  Lab 10/13/20 1215 10/13/20 1332 10/14/20 0126 10/15/20 1450  WBC 8.1  --  4.2 10.0  NEUTROABS 6.4  --   --   --  HGB 10.2* 12.2* 9.6* 10.1*  HCT 34.4* 36.0* 30.7* 33.8*  MCV 74.9*  --  71.6* 72.7*  PLT 329  --  326 510   Basic Metabolic Panel: Recent Labs  Lab 10/13/20 1215 10/13/20 1332 10/14/20 0126  NA 130* 131* 130*  K 5.4* 5.1 4.2  CL 99  --  98  CO2 19*  --  22  GLUCOSE 95  --  267*  BUN 15  --  13  CREATININE 1.05  --  1.10  CALCIUM 9.1  --  8.8*  MG  --   --  1.5*   GFR: Estimated Creatinine Clearance: 63.7 mL/min (by C-G formula based on SCr of 1.1 mg/dL). Liver Function Tests: Recent Labs  Lab 10/13/20 1215  AST 17  ALT 15  ALKPHOS 60  BILITOT 0.7  PROT 6.6  ALBUMIN 3.4*   No results for input(s): LIPASE, AMYLASE in the last 168 hours. No results for input(s): AMMONIA in the last 168 hours. Coagulation Profile: No results for input(s): INR, PROTIME in the last 168 hours. Cardiac Enzymes: No results for input(s): CKTOTAL, CKMB, CKMBINDEX, TROPONINI in the last 168 hours. BNP (last 3 results) No results for input(s): PROBNP in the last 8760  hours. HbA1C: No results for input(s): HGBA1C in the last 72 hours. CBG: Recent Labs  Lab 10/19/20 0642 10/19/20 1207 10/19/20 1602 10/19/20 2112 10/20/20 0605  GLUCAP 157* 71 177* 136* 90   Lipid Profile: No results for input(s): CHOL, HDL, LDLCALC, TRIG, CHOLHDL, LDLDIRECT in the last 72 hours. Thyroid Function Tests: No results for input(s): TSH, T4TOTAL, FREET4, T3FREE, THYROIDAB in the last 72 hours. Anemia Panel: No results for input(s): VITAMINB12, FOLATE, FERRITIN, TIBC, IRON, RETICCTPCT in the last 72 hours. Sepsis Labs: No results for input(s): PROCALCITON, LATICACIDVEN in the last 168 hours.  Recent Results (from the past 240 hour(s))  Respiratory Panel by RT PCR (Flu A&B, Covid) - Nasopharyngeal Swab     Status: None   Collection Time: 10/13/20 12:22 PM   Specimen: Nasopharyngeal Swab  Result Value Ref Range Status   SARS Coronavirus 2 by RT PCR NEGATIVE NEGATIVE Final    Comment: (NOTE) SARS-CoV-2 target nucleic acids are NOT DETECTED.  The SARS-CoV-2 RNA is generally detectable in upper respiratoy specimens during the acute phase of infection. The lowest concentration of SARS-CoV-2 viral copies this assay can detect is 131 copies/mL. A negative result does not preclude SARS-Cov-2 infection and should not be used as the sole basis for treatment or other patient management decisions. A negative result may occur with  improper specimen collection/handling, submission of specimen other than nasopharyngeal swab, presence of viral mutation(s) within the areas targeted by this assay, and inadequate number of viral copies (<131 copies/mL). A negative result must be combined with clinical observations, patient history, and epidemiological information. The expected result is Negative.  Fact Sheet for Patients:  PinkCheek.be  Fact Sheet for Healthcare Providers:  GravelBags.it  This test is no t yet approved  or cleared by the [place] FDA and  has been authorized for detection and/or diagnosis of SARS-CoV-2 by FDA under an Emergency Use Authorization (EUA). This EUA will remain  in effect (meaning this test can be used) for the duration of the COVID-19 declaration under Section 564(b)(1) of the Act, 21 U.S.C. section 360bbb-3(b)(1), unless the authorization is terminated or revoked sooner.     Influenza A by PCR NEGATIVE NEGATIVE Final   Influenza B by PCR NEGATIVE NEGATIVE Final  Comment: (NOTE) The Xpert Xpress SARS-CoV-2/FLU/RSV assay is intended as an aid in  the diagnosis of influenza from Nasopharyngeal swab specimens and  should not be used as a sole basis for treatment. Nasal washings and  aspirates are unacceptable for Xpert Xpress SARS-CoV-2/FLU/RSV  testing.  Fact Sheet for Patients: PinkCheek.be  Fact Sheet for Healthcare Providers: GravelBags.it  This test is not yet approved or cleared by the [place] FDA and  has been authorized for detection and/or diagnosis of SARS-CoV-2 by  FDA under an Emergency Use Authorization (EUA). This EUA will remain  in effect (meaning this test can be used) for the duration of the  Covid-19 declaration under Section 564(b)(1) of the Act, 21  U.S.C. section 360bbb-3(b)(1), unless the authorization is  terminated or revoked. Performed at [facility], [place] [address], [place], [place] [address]       Radiology Studies: No results found.  Scheduled Meds: .  stroke: mapping our early stages of recovery book   Does not apply Once  . aspirin EC  81 mg Oral Daily  . atorvastatin  40 mg Oral QHS  . cilostazol  50 mg Oral BID  . clopidogrel  75 mg Oral Daily  . dextrose  1 ampule Intravenous Once  . feeding supplement  237 mL Oral BID BM  . ferrous sulfate  325 mg Oral TID WC  . insulin aspart  0-15 Units Subcutaneous TID WC  . insulin aspart  0-5 Units  Subcutaneous QHS  . insulin aspart protamine- aspart  40 Units Subcutaneous BID WC  . metoprolol succinate  150 mg Oral BH-qamhs  . mometasone-formoterol  2 puff Inhalation BID  . multivitamin with minerals  1 tablet Oral Daily  . nortriptyline  50 mg Oral QHS  . oxybutynin  10 mg Oral Daily  . pantoprazole  40 mg Oral Daily  . sodium zirconium cyclosilicate  10 g Oral Once  . umeclidinium bromide  1 puff Inhalation Daily   Continuous Infusions:   LOS: 7 days   Time spent: 26 minutes  Little Ishikawa, DO [facility]  10/20/2020, 7:34 AM   To contact the attending provider between 7A-7P or the covering provider during after hours 7P-7A, please log into the web site [email or web address].

## 2020-10-21 DIAGNOSIS — I10 Essential (primary) hypertension: Secondary | ICD-10-CM | POA: Diagnosis not present

## 2020-10-21 LAB — RESPIRATORY PANEL BY RT PCR (FLU A&B, COVID)
Influenza A by PCR: NEGATIVE
Influenza B by PCR: NEGATIVE
SARS Coronavirus 2 by RT PCR: NEGATIVE

## 2020-10-21 LAB — GLUCOSE, CAPILLARY
Glucose-Capillary: 139 mg/dL — ABNORMAL HIGH (ref 70–99)
Glucose-Capillary: 232 mg/dL — ABNORMAL HIGH (ref 70–99)
Glucose-Capillary: 153 mg/dL — ABNORMAL HIGH (ref 70–99)

## 2020-10-21 MED ORDER — CLOPIDOGREL BISULFATE 75 MG PO TABS
75.0000 mg | ORAL_TABLET | Freq: Every day | ORAL | 1 refills | Status: DC
Start: 2020-10-22 — End: 2021-05-04

## 2020-10-21 MED ORDER — POLYETHYLENE GLYCOL 3350 17 G PO PACK
17.0000 g | PACK | Freq: Every day | ORAL | Status: DC
  Administered 2020-10-21: 17 g via ORAL
  Filled 2020-10-21: qty 1

## 2020-10-21 MED ORDER — METOPROLOL SUCCINATE ER 50 MG PO TB24: 150.0000 mg | ORAL_TABLET | ORAL | 0 refills | Status: DC

## 2020-10-21 MED ORDER — FERROUS SULFATE 325 (65 FE) MG PO TABS
325.0000 mg | ORAL_TABLET | Freq: Three times a day (TID) | ORAL | 3 refills | Status: DC
Start: 2020-10-21 — End: 2021-01-07

## 2020-10-21 MED ORDER — ASPIRIN 81 MG PO TBEC
81.0000 mg | DELAYED_RELEASE_TABLET | Freq: Every day | ORAL | 0 refills | Status: DC
Start: 2020-10-21 — End: 2021-05-04

## 2020-10-21 MED ORDER — CILOSTAZOL 50 MG PO TABS
50.0000 mg | ORAL_TABLET | Freq: Two times a day (BID) | ORAL | 1 refills | Status: DC
Start: 2020-10-21 — End: 2021-01-14

## 2020-10-21 NOTE — Progress Notes (Signed)
Inpatient Rehab Admissions Coordinator:   Spoke with TOC yesterday that pt will need to use [employer] Benefits for SNF.  Will withdraw CIR request today and sign off.  Shann Medal, PT, DPT Admissions Coordinator [phone] 10/21/20  9:17 AM

## 2020-10-21 NOTE — Progress Notes (Signed)
Report called to Benjamine Mola, Therapist, sports at [facility].  Pt has all belongings with him including his dentures, cell phone and electric razor.  Awaiting PTAR for transportation.

## 2020-10-21 NOTE — Discharge Summary (Signed)
Physician Discharge Summary  Brandon [name] IPJ:[id number] DOB: June 09, 1946 DOA: 10/13/2020  PCP: Pcp, No  Admit date: 10/13/2020 Discharge date: 10/21/2020  Admitted From: Home Disposition: SNF  Recommendations for Outpatient Follow-up:  1. Follow up with PCP in 1-2 weeks 2. Neurology as scheduled:   Discharge Condition: Stable CODE STATUS: Full Diet recommendation: Low-fat, low-salt diabetic diet  Brief/Interim Summary: Brandon Hugginsis a 74 y.o.malewith medical history significant ofchronic hypoxemic respiratory failure secondary to underlying COPD-on 3 L of oxygen at home, hypertension, hyperlipidemia, GERD, tobacco abuse, coronary artery disease, type 2 diabetes mellitus, PVD presented to ED with left leg weakness and shortness of breath of 1 day duration. EMS was called and was noted that his oxygen saturation was 86% on 3 L by nasal cannula. They placed patient on nonrebreather with 10 L supplemental oxygen and his sats improved to 99%. He reported wheezing, leg swelling, shortness of breath even at rest, orthopnea and unable to stand up due to left leg weakness.  No other complaint. In ED: Patient tachypneic, hypoxic requiring 4 L of oxygen via nasal cannula, afebrile with no leukocytosis, UA negative for infection, patient was found to be hypoglycemic upon arrival with blood glucose of 53. Received dextrose 50 and his blood sugar improved to 68 and then 144. Patient received DuoNeb's, albuterol breathing treatment, Solu-Medrol loading dose, Rocephin and azithromycin in ED. CT head was obtained which was concerning for ischemic stroke. Neurology consulted.  Patient was admitted under hospitalist service. MRI brain shows multiple small acute infarcts within both hemispheres, possibly embolic.  Patient was on aspirin.  Plavix has been added.  He was also on Pletal.  Patient admitted as above with acute unilateral weakness difficulty ambulating and dyspnea noted to have acute  ischemic stroke on imaging.  Concern for embolic disease, neurology consulted and following.  Patient to be continued on statin, aspirin, Plavix, and Pletal, will continue for 30 days then continue on Plavix and Pletal only.  Patient is also to be placed on cardiac monitor per neurology recommendations.  At this time patient continues to be evaluated by PT recommending SNF placement given ongoing weakness although markedly improving from admission but not yet back to baseline.  Patient otherwise stable and agreeable for discharge to SNF for ongoing treatment.  Follow-up in the outpatient setting with PCP, cardiology, neurology.  If event monitor remains negative for 30 days neurology is considering loop recorder recommendations.    Discharge Diagnoses:  Principal Problem:   Ischemic stroke [facility]) Active Problems:   Essential hypertension   Type 2 diabetes mellitus ([place])   CAD (coronary artery disease)   GERD (gastroesophageal reflux disease)   Acute on chronic respiratory failure with hypoxemia (H[facility])   COPD (chronic obstructive pulmonary disease) (H[facility])   Tobacco abuse   Hyponatremia   Hyperkalemia    Discharge Instructions  Discharge Instructions    Ambulatory referral to Neurology   Complete by: As directed    Follow up with stroke clinic NP (Jessica Vanschaick or Cecille Rubin, if both not available, consider Zachery Dauer, or Ahern) at [facility] in about 4 weeks. Thanks.   Diet - low sodium heart healthy   Complete by: As directed    Increase activity slowly   Complete by: As directed      Allergies as of 10/21/2020      Reactions   Furosemide Injection [furosemide] Other (See Comments)   swelling   Hctz [hydrochlorothiazide]       Medication List    STOP taking  these medications   enalapril 20 MG tablet Commonly known as: VASOTEC   spironolactone 25 MG tablet Commonly known as: ALDACTONE     TAKE these medications   albuterol 108 (90 Base) MCG/ACT inhaler Commonly  known as: VENTOLIN HFA Inhale 2 puffs into the lungs every 6 (six) hours as needed for wheezing or shortness of breath.   aspirin 81 MG EC tablet Take 1 tablet (81 mg total) by mouth daily.   atorvastatin 40 MG tablet Commonly known as: LIPITOR Take 40 mg by mouth at bedtime.   cilostazol 50 MG tablet Commonly known as: PLETAL Take 1 tablet (50 mg total) by mouth 2 (two) times daily. What changed: medication strength   clopidogrel 75 MG tablet Commonly known as: PLAVIX Take 1 tablet (75 mg total) by mouth daily. Start taking on: October 22, 2020   ferrous sulfate 325 (65 FE) MG tablet Take 1 tablet (325 mg total) by mouth 3 (three) times daily with meals.   Fluticasone-Salmeterol 250-50 MCG/DOSE Aepb Commonly known as: ADVAIR Inhale 1 puff into the lungs 2 (two) times daily.   insulin aspart protamine - aspart (70-30) 100 UNIT/ML FlexPen Commonly known as: NOVOLOG 70/30 MIX Inject 45 Units into the skin 2 (two) times daily with a meal.   KETOROLAC TROMETHAMINE (PF) OP Apply 1 drop to eye in the morning, at noon, in the evening, and at bedtime.   metFORMIN 1000 MG tablet Commonly known as: GLUCOPHAGE Take 1,000 mg by mouth 2 (two) times daily with a meal.   metoprolol succinate 50 MG 24 hr tablet Commonly known as: TOPROL-XL Take 3 tablets (150 mg total) by mouth 2 (two) times daily in the am and at bedtime.. Take with or immediately following a meal. What changed:   medication strength  when to take this  additional instructions   nortriptyline 50 MG capsule Commonly known as: PAMELOR Take 50 mg by mouth at bedtime.   Omega-3 1000 MG Caps Take 1,000 mg by mouth in the morning and at bedtime.   omeprazole 20 MG capsule Commonly known as: PRILOSEC Take 20 mg by mouth daily.   oxybutynin 10 MG 24 hr tablet Commonly known as: DITROPAN-XL Take 10 mg by mouth daily.   Tiotropium Bromide Monohydrate 2.5 MCG/ACT Aers Inhale 2 puffs into the lungs daily.        Follow-up Information    [facility]. Schedule an appointment as soon as possible for a visit in 4 week(s).   Specialty: Neurology Contact information: [place] [phone]             Allergies  Allergen Reactions  . Furosemide Injection [Furosemide] Other (See Comments)    swelling  . Hctz [Hydrochlorothiazide]     Consultations: Neurology, cardiology  Procedures/Studies: CT ANGIO HEAD W OR WO CONTRAST  Result Date: 10/13/2020 CLINICAL DATA:  Left-sided weakness EXAM: CT ANGIOGRAPHY HEAD AND NECK TECHNIQUE: Multidetector CT imaging of the head and neck was performed using the standard protocol during bolus administration of intravenous contrast. Multiplanar CT image reconstructions and MIPs were obtained to evaluate the vascular anatomy. Carotid stenosis measurements (when applicable) are obtained utilizing NASCET criteria, using the distal internal carotid diameter as the denominator. CONTRAST:  28mL OMNIPAQUE IOHEXOL 350 MG/ML SOLN COMPARISON:  CT head earlier same day FINDINGS: CT HEAD Brain: There is no acute intracranial hemorrhage, mass effect, or edema. Gray-white differentiation is preserved. There is no extra-axial fluid collection. Prominence of the ventricles  and sulci reflecting stable parenchymal volume loss. Patchy hypoattenuation in the supratentorial white matter likely reflects stable chronic microvascular ischemic changes. As noted previously, a superimposed small vessel infarct of the right corona radiata is not excluded. Vascular: No hyperdense vessel. Skull: Calvarium is unremarkable. Sinuses/Orbits: No acute finding. Other: None. Review of the MIP images confirms the above findings CTA NECK Aortic arch: Calcified plaque along the arch and great vessel origins, which are patent. Right carotid system: Patent. Primarily calcified plaque along the proximal carotid causes less than 50% stenosis.  Left carotid system: Patent. Primarily calcified plaque along the proximal internal carotid causes less than 50% stenosis. There is also calcified plaque along the distal internal carotid causing minimal stenosis. Vertebral arteries: Patent. Right vertebral artery is dominant. Multifocal calcified plaque along the right vertebral artery causing up to moderate stenosis. Skeleton: Multilevel cervical spine degenerative changes. Other neck: No mass or adenopathy. Upper chest: Advanced emphysema.  Trace right pleural effusion. Review of the MIP images confirms the above findings CTA HEAD Anterior circulation: Intracranial internal carotid arteries are patent with calcified plaque causing mild stenosis. Anterior cerebral arteries are patent. 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 Middle cerebral arteries are patent. Posterior circulation: Intracranial vertebral arteries are patent with calcified plaque causing mild stenosis. Basilar artery is patent. Posterior cerebral arteries are patent. Right larger than left posterior communicating arteries are present. Venous sinuses: Patent as allowed by contrast bolus timing. Review of the MIP images confirms the above findings IMPRESSION: No acute intracranial abnormality. No large vessel occlusion or hemodynamically significant stenosis. Electronically Signed   By: Macy Mis M.D.   On: 10/13/2020 17:00   CT Head Wo Contrast  Result Date: 10/13/2020 CLINICAL DATA:  Neuro deficit, left arm and leg weakness. EXAM: CT HEAD WITHOUT CONTRAST TECHNIQUE: Contiguous axial images were obtained from the base of the skull through the vertex without intravenous contrast. COMPARISON:  None. FINDINGS: Brain: No intracranial hemorrhage. Small right corona radiata hypodensity. No mass lesion. No midline shift, ventriculomegaly or extra-axial fluid collection. Mild diffuse cerebral atrophy with ex vacuo dilatation. Scattered and confluent periventricular and subcortical white  matter hypodensities are nonspecific however commonly associated with chronic microvascular ischemic changes. Vascular: No hyperdense vessel or unexpected calcification. Bilateral skull base atherosclerotic calcifications. Skull: Negative for fracture or focal lesion. Sinuses/Orbits: Normal orbits. Sequela chronic of maxillary sinus disease with moderate mucosal thickening and layering secretions. Mild right maxillary sinus mucosal thickening. Right mastoid effusion with dehiscence of the bony posterior external auditory canal wall. Other: None. IMPRESSION: Small right corona radiata hypodensity may reflect acute/subacute insult versus chronic microvascular ischemic changes. Consider MRI if suspicion persists. Mild cerebral atrophy and chronic microvascular ischemic changes. Right mastoid effusion with dehiscence of the bony external auditory canal. Further evaluation with outpatient CT temporal bone is recommended. Electronically Signed   By: Primitivo Gauze M.D.   On: 10/13/2020 14:11   CT ANGIO NECK W OR WO CONTRAST  Result Date: 10/13/2020 CLINICAL DATA:  Left-sided weakness EXAM: CT ANGIOGRAPHY HEAD AND NECK TECHNIQUE: Multidetector CT imaging of the head and neck was performed using the standard protocol during bolus administration of intravenous contrast. Multiplanar CT image reconstructions and MIPs were obtained to evaluate the vascular anatomy. Carotid stenosis measurements (when applicable) are obtained utilizing NASCET criteria, using the distal internal carotid diameter as the denominator. CONTRAST:  60mL OMNIPAQUE IOHEXOL 350 MG/ML SOLN COMPARISON:  CT head earlier same day FINDINGS: CT HEAD Brain: There is no acute intracranial hemorrhage, mass  effect, or edema. Gray-white differentiation is preserved. There is no extra-axial fluid collection. Prominence of the ventricles and sulci reflecting stable parenchymal volume loss. Patchy hypoattenuation in the supratentorial white matter likely  reflects stable chronic microvascular ischemic changes. As noted previously, a superimposed small vessel infarct of the right corona radiata is not excluded. Vascular: No hyperdense vessel. Skull: Calvarium is unremarkable. Sinuses/Orbits: No acute finding. Other: None. Review of the MIP images confirms the above findings CTA NECK Aortic arch: Calcified plaque along the arch and great vessel origins, which are patent. Right carotid system: Patent. Primarily calcified plaque along the proximal carotid causes less than 50% stenosis. Left carotid system: Patent. Primarily calcified plaque along the proximal internal carotid causes less than 50% stenosis. There is also calcified plaque along the distal internal carotid causing minimal stenosis. Vertebral arteries: Patent. Right vertebral artery is dominant. Multifocal calcified plaque along the right vertebral artery causing up to moderate stenosis. Skeleton: Multilevel cervical spine degenerative changes. Other neck: No mass or adenopathy. Upper chest: Advanced emphysema.  Trace right pleural effusion. Review of the MIP images confirms the above findings CTA HEAD Anterior circulation: Intracranial internal carotid arteries are patent with calcified plaque causing mild stenosis. Anterior cerebral arteries are patent. 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 Middle cerebral arteries are patent. Posterior circulation: Intracranial vertebral arteries are patent with calcified plaque causing mild stenosis. Basilar artery is patent. Posterior cerebral arteries are patent. Right larger than left posterior communicating arteries are present. Venous sinuses: Patent as allowed by contrast bolus timing. Review of the MIP images confirms the above findings IMPRESSION: No acute intracranial abnormality. No large vessel occlusion or hemodynamically significant stenosis. Electronically Signed   By: Macy Mis M.D.   On: 10/13/2020 17:00   CT Chest Wo Contrast  Result  Date: 10/13/2020 CLINICAL DATA:  Shortness of breath in a patient with a history of COPD. EXAM: CT CHEST WITHOUT CONTRAST TECHNIQUE: Multidetector CT imaging of the chest was performed following the standard protocol without IV contrast. COMPARISON:  Single-view of the chest 10/13/2020 and 07/21/2008. FINDINGS: Cardiovascular: The patient has extensive calcific aortic and coronary atherosclerosis. The ascending aorta measures 4.8 cm transverse at the level of the pulmonary outflow tract. Heart size is upper normal. No pericardial effusion Mediastinum/Nodes: No enlarged mediastinal or axillary lymph nodes. Thyroid gland, trachea, and esophagus demonstrate no significant findings. Lungs/Pleura: There are small bilateral pleural effusions. The patient has severe central lobular and paraseptal emphysema. There is mild basilar atelectasis bilaterally. Upper Abdomen: No acute abnormality. Three cysts are seen in the liver. The largest is in the right lobe and measures 6.2 cm. Musculoskeletal: No acute or focal abnormality. Multilevel cyst thoracic spondylosis is noted. IMPRESSION: Small bilateral pleural effusions and dependent basilar atelectasis. 4.8 cm ascending thoracic aortic aneurysm. Recommend semi-annual imaging followup by CTA or MRA and referral to cardiothoracic surgery if not already obtained. This recommendation follows 2010 ACCF/AHA/AATS/ACR/ASA/SCA/SCAI/SIR/STS/SVM Guidelines for the Diagnosis and Management of Patients With Thoracic Aortic Disease. Circulation. 2010; 121: Z660-Y301. Aortic atherosclerosis (ICD10-I70.0) and severe emphysema (ICD10-J43.9). Extensive calcific coronary atherosclerosis also noted. Electronically Signed   By: Inge Rise M.D.   On: 10/13/2020 14:11   MR BRAIN WO CONTRAST  Result Date: 10/13/2020 CLINICAL DATA:  Acute neurologic deficit EXAM: MRI HEAD WITHOUT CONTRAST TECHNIQUE: Multiplanar, multiecho pulse sequences of the brain and surrounding structures were  obtained without intravenous contrast. COMPARISON:  10/13/2020 CTA head FINDINGS: Brain: Multiple small acute infarcts within both hemispheres, including the right precentral  gyrus, left middle frontal gyrus and paramedian left parietal lobe. No acute hemorrhage. Early confluent hyperintense T2-weighted signal of the periventricular and deep white matter. There is generalized atrophy without lobar predilection. No chronic microhemorrhage. Normal midline structures. Choose 1 Vascular: Normal flow voids. Skull and upper cervical spine: Normal marrow signal. Sinuses/Orbits: Right mastoid effusion and right ocular lens replacement. Other: None IMPRESSION: 1. Multiple small acute infarcts within both hemispheres, possibly of an embolic etiology. 2. Generalized atrophy and findings of chronic microvascular disease. Electronically Signed   By: Ulyses Jarred M.D.   On: 10/13/2020 19:47   DG Chest Port 1 View  Result Date: 10/13/2020 CLINICAL DATA:  Shortness of breath. Per chart review, patient has bilateral lower lobe rales and bilateral lower extremity edema. EXAM: PORTABLE CHEST 1 VIEW COMPARISON:  07/21/2008 FINDINGS: There is diffuse interstitial prominence, suggestive of interstitial pulmonary edema. No confluent consolidation. No visible pleural effusions or pneumothorax on this limited AP portable radiograph. Mild enlargement the cardiac silhouette. Tortuous aorta. Aortic atherosclerosis. IMPRESSION: 1. Diffuse interstitial prominence, suggestive of interstitial pulmonary edema. Atypical infection could have a similar appearance. 2. Mild cardiomegaly. Electronically Signed   By: Margaretha Sheffield MD   On: 10/13/2020 13:17   ECHOCARDIOGRAM COMPLETE  Result Date: 10/14/2020    ECHOCARDIOGRAM REPORT   Patient Name:   Brandon [name] Date of Exam: 10/14/2020 Medical Rec #:  [id number]     Height:       72.0 in Accession #:    [id number]    Weight:       186.1 lb Date of Birth:  1946-03-22     BSA:          2.066  m Patient Age:    71 years      BP:           90/62 mmHg Patient Gender: M             HR:           86 bpm. Exam Location:  Inpatient Procedure: 2D Echo, Color Doppler and Cardiac Doppler                                 MODIFIED REPORT: This report was modified by Cherlynn Kaiser MD on 10/14/2020 due to non-clinical                                    revision.  Indications:     Stroke 434.91 / I163.9  History:         Patient has no prior history of Echocardiogram examinations.                  CAD; Risk Factors:Hypertension, Diabetes and GERD. Abdominal                  Aortic Aneurysm.  Sonographer:     Tiffany Dance Referring Phys:  [id number] Mckinley Jewel Diagnosing Phys: Cherlynn Kaiser MD IMPRESSIONS  1. Left ventricular ejection fraction, by estimation, is 60 to 65%. The left ventricle has normal function. The left ventricle has no regional wall motion abnormalities. There is moderate left ventricular hypertrophy. Indeterminate diastolic filling due  to E-A fusion.  2. Right ventricular systolic function is normal. The right ventricular size is mildly enlarged. Tricuspid regurgitation signal is inadequate for assessing PA pressure.  3. The mitral  valve is degenerative. Mild mitral valve regurgitation. No evidence of mitral stenosis.  4. Prominent LVOT calcifications extending along base of anterior mitral leaflet.. The aortic valve is abnormal. There is moderate calcification of the aortic valve. Aortic valve regurgitation is mild to moderate. No aortic stenosis is present.  5. Aortic dilatation noted. There is mild dilatation of the aortic root, measuring 40 mm. There is moderate dilatation of the ascending aorta, measuring 48 mm.  6. The inferior vena cava is dilated in size with >50% respiratory variability, suggesting right atrial pressure of 8 mmHg.  7. Cannot exclude small PFO by color flow. Atrial septal aneurysm. Conclusion(s)/Recommendation(s): Bulky LVOT calcifications may be a cardiac source of  stroke. FINDINGS  Left Ventricle: Left ventricular ejection fraction, by estimation, is 60 to 65%. The left ventricle has normal function. The left ventricle has no regional wall motion abnormalities. The left ventricular internal cavity size was normal in size. There is  moderate left ventricular hypertrophy. Indeterminate diastolic filling due to E-A fusion. Right Ventricle: The right ventricular size is mildly enlarged. No increase in right ventricular wall thickness. Right ventricular systolic function is normal. Tricuspid regurgitation signal is inadequate for assessing PA pressure. Left Atrium: Left atrial size was normal in size. Right Atrium: Right atrial size was normal in size. Pericardium: There is no evidence of pericardial effusion. Mitral Valve: The mitral valve is degenerative in appearance. There is mild thickening of the mitral valve leaflet(s). There is moderate calcification of the mitral valve leaflet(s). Mild mitral valve regurgitation. No evidence of mitral valve stenosis. Tricuspid Valve: The tricuspid valve is normal in structure. Tricuspid valve regurgitation is trivial. No evidence of tricuspid stenosis. Aortic Valve: Prominent LVOT calcifications extending along base of anterior mitral leaflet. The aortic valve is abnormal. There is moderate calcification of the aortic valve. Aortic valve regurgitation is mild to moderate. No aortic stenosis is present. Pulmonic Valve: The pulmonic valve was grossly normal. Pulmonic valve regurgitation is trivial. No evidence of pulmonic stenosis. Aorta: Aortic dilatation noted. There is mild dilatation of the aortic root, measuring 40 mm. There is moderate dilatation of the ascending aorta, measuring 48 mm. Venous: The inferior vena cava is dilated in size with greater than 50% respiratory variability, suggesting right atrial pressure of 8 mmHg. IAS/Shunts: The interatrial septum is aneurysmal. Cannot exclude small PFO by color flow. Atrial septal  aneurysm. Additional Comments: A venous catheter is visualized in the right atrium and right ventricle.  LEFT VENTRICLE PLAX 2D LVIDd:         4.30 cm  Diastology LVIDs:         2.80 cm  LV e' medial:    5.33 cm/s LV PW:         1.50 cm  LV E/e' medial:  14.6 LV IVS:        1.40 cm  LV e' lateral:   6.31 cm/s LVOT diam:     2.20 cm  LV E/e' lateral: 12.3 LV SV:         70 LV SV Index:   34 LVOT Area:     3.80 cm  RIGHT VENTRICLE             IVC RV Basal diam:  3.10 cm     IVC diam: 2.30 cm RV Mid diam:    2.60 cm RV S prime:     11.00 cm/s TAPSE (M-mode): 1.7 cm LEFT ATRIUM             Index  RIGHT ATRIUM           Index LA diam:        4.30 cm 2.08 cm/m  RA Area:     19.50 cm LA Vol (A2C):   64.7 ml 31.31 ml/m RA Volume:   58.10 ml  28.12 ml/m LA Vol (A4C):   57.7 ml 27.93 ml/m LA Biplane Vol: 62.8 ml 30.39 ml/m  AORTIC VALVE LVOT Vmax:   89.20 cm/s LVOT Vmean:  58.400 cm/s LVOT VTI:    0.184 m  AORTA Ao Root diam: 4.00 cm Ao Asc diam:  4.80 cm MITRAL VALVE MV Area (PHT): 3.99 cm    SHUNTS MV Decel Time: 190 msec    Systemic VTI:  0.18 m MV E velocity: 77.60 cm/s  Systemic Diam: 2.20 cm MV A velocity: 91.70 cm/s MV E/A ratio:  0.85 Cherlynn Kaiser MD Electronically signed by Cherlynn Kaiser MD Signature Date/Time: 10/14/2020/12:22:32 PM    Final (Updated)       Subjective: No acute issues or events overnight, feels quite well, denies nausea, vomiting, diarrhea, constipation, headache, fevers, chills.   Discharge Exam: Vitals:   10/21/20 0700 10/21/20 0800  BP:  136/75  Pulse:    Resp: 17   Temp:  (!) 97.5 F (36.4 C)  SpO2:  95%   Vitals:   10/20/20 2308 10/21/20 0415 10/21/20 0700 10/21/20 0800  BP: (!) 98/54 (!) 98/58  136/75  Pulse: 90 79    Resp: 19 17 17    Temp: 98.1 F (36.7 C) 98.1 F (36.7 C)  (!) 97.5 F (36.4 C)  TempSrc: Oral Oral  Oral  SpO2: 95% 95%  95%  Weight:      Height:        General: Pt is alert, awake, not in acute distress Cardiovascular: RRR,  S1/S2 +, no rubs, no gallops Respiratory: CTA bilaterally, no wheezing, no rhonchi Abdominal: Soft, NT, ND, bowel sounds + Extremities: no edema, no cyanosis    The results of significant diagnostics from this hospitalization (including imaging, microbiology, ancillary and laboratory) are listed below for reference.     Microbiology: Recent Results (from the past 240 hour(s))  Respiratory Panel by RT PCR (Flu A&B, Covid) - Nasopharyngeal Swab     Status: None   Collection Time: 10/13/20 12:22 PM   Specimen: Nasopharyngeal Swab  Result Value Ref Range Status   SARS Coronavirus 2 by RT PCR NEGATIVE NEGATIVE Final    Comment: (NOTE) SARS-CoV-2 target nucleic acids are NOT DETECTED.  The SARS-CoV-2 RNA is generally detectable in upper respiratoy specimens during the acute phase of infection. The lowest concentration of SARS-CoV-2 viral copies this assay can detect is 131 copies/mL. A negative result does not preclude SARS-Cov-2 infection and should not be used as the sole basis for treatment or other patient management decisions. A negative result may occur with  improper specimen collection/handling, submission of specimen other than nasopharyngeal swab, presence of viral mutation(s) within the areas targeted by this assay, and inadequate number of viral copies (<131 copies/mL). A negative result must be combined with clinical observations, patient history, and epidemiological information. The expected result is Negative.  Fact Sheet for Patients:  PinkCheek.be  Fact Sheet for Healthcare Providers:  GravelBags.it  This test is no t yet approved or cleared by the [place] FDA and  has been authorized for detection and/or diagnosis of SARS-CoV-2 by FDA under an Emergency Use Authorization (EUA). This EUA will remain  in effect (meaning this test can  be used) for the duration of the COVID-19 declaration under Section  564(b)(1) of the Act, 21 U.S.C. section 360bbb-3(b)(1), unless the authorization is terminated or revoked sooner.     Influenza A by PCR NEGATIVE NEGATIVE Final   Influenza B by PCR NEGATIVE NEGATIVE Final    Comment: (NOTE) The Xpert Xpress SARS-CoV-2/FLU/RSV assay is intended as an aid in  the diagnosis of influenza from Nasopharyngeal swab specimens and  should not be used as a sole basis for treatment. Nasal washings and  aspirates are unacceptable for Xpert Xpress SARS-CoV-2/FLU/RSV  testing.  Fact Sheet for Patients: PinkCheek.be  Fact Sheet for Healthcare Providers: GravelBags.it  This test is not yet approved or cleared by the [place] FDA and  has been authorized for detection and/or diagnosis of SARS-CoV-2 by  FDA under an Emergency Use Authorization (EUA). This EUA will remain  in effect (meaning this test can be used) for the duration of the  Covid-19 declaration under Section 564(b)(1) of the Act, 21  U.S.C. section 360bbb-3(b)(1), unless the authorization is  terminated or revoked. Performed at [facility], [place] [address], [place], [place] [address]      Labs: BNP (last 3 results) Recent Labs    10/13/20 1215  BNP 790.2*   Basic Metabolic Panel: No results for input(s): NA, K, CL, CO2, GLUCOSE, BUN, CREATININE, CALCIUM, MG, PHOS in the last 168 hours. Liver Function Tests: No results for input(s): AST, ALT, ALKPHOS, BILITOT, PROT, ALBUMIN in the last 168 hours. No results for input(s): LIPASE, AMYLASE in the last 168 hours. No results for input(s): AMMONIA in the last 168 hours. CBC: Recent Labs  Lab 10/15/20 1450  WBC 10.0  HGB 10.1*  HCT 33.8*  MCV 72.7*  PLT 344   Cardiac Enzymes: No results for input(s): CKTOTAL, CKMB, CKMBINDEX, TROPONINI in the last 168 hours. BNP: Invalid input(s): POCBNP CBG: Recent Labs  Lab 10/20/20 1257 10/20/20 1731 10/20/20 2129  10/21/20 0629 10/21/20 1115  GLUCAP 197* 214* 215* 139* 232*   D-Dimer No results for input(s): DDIMER in the last 72 hours. Hgb A1c No results for input(s): HGBA1C in the last 72 hours. Lipid Profile No results for input(s): CHOL, HDL, LDLCALC, TRIG, CHOLHDL, LDLDIRECT in the last 72 hours. Thyroid function studies No results for input(s): TSH, T4TOTAL, T3FREE, THYROIDAB in the last 72 hours.  Invalid input(s): FREET3 Anemia work up No results for input(s): VITAMINB12, FOLATE, FERRITIN, TIBC, IRON, RETICCTPCT in the last 72 hours. Urinalysis    Component Value Date/Time   COLORURINE YELLOW 10/13/2020 1228   APPEARANCEUR CLEAR 10/13/2020 1228   LABSPEC 1.009 10/13/2020 1228   PHURINE 6.0 10/13/2020 1228   GLUCOSEU NEGATIVE 10/13/2020 1228   HGBUR NEGATIVE 10/13/2020 [place] 10/13/2020 1228   KETONESUR 5 (A) 10/13/2020 1228   PROTEINUR NEGATIVE 10/13/2020 1228   NITRITE NEGATIVE 10/13/2020 1228   LEUKOCYTESUR NEGATIVE 10/13/2020 1228   Sepsis Labs Invalid input(s): PROCALCITONIN,  WBC,  LACTICIDVEN Microbiology Recent Results (from the past 240 hour(s))  Respiratory Panel by RT PCR (Flu A&B, Covid) - Nasopharyngeal Swab     Status: None   Collection Time: 10/13/20 12:22 PM   Specimen: Nasopharyngeal Swab  Result Value Ref Range Status   SARS Coronavirus 2 by RT PCR NEGATIVE NEGATIVE Final    Comment: (NOTE) SARS-CoV-2 target nucleic acids are NOT DETECTED.  The SARS-CoV-2 RNA is generally detectable in upper respiratoy specimens during the acute phase of infection. The lowest concentration of  SARS-CoV-2 viral copies this assay can detect is 131 copies/mL. A negative result does not preclude SARS-Cov-2 infection and should not be used as the sole basis for treatment or other patient management decisions. A negative result may occur with  improper specimen collection/handling, submission of specimen other than nasopharyngeal swab, presence of viral  mutation(s) within the areas targeted by this assay, and inadequate number of viral copies (<131 copies/mL). A negative result must be combined with clinical observations, patient history, and epidemiological information. The expected result is Negative.  Fact Sheet for Patients:  PinkCheek.be  Fact Sheet for Healthcare Providers:  GravelBags.it  This test is no t yet approved or cleared by the [place] FDA and  has been authorized for detection and/or diagnosis of SARS-CoV-2 by FDA under an Emergency Use Authorization (EUA). This EUA will remain  in effect (meaning this test can be used) for the duration of the COVID-19 declaration under Section 564(b)(1) of the Act, 21 U.S.C. section 360bbb-3(b)(1), unless the authorization is terminated or revoked sooner.     Influenza A by PCR NEGATIVE NEGATIVE Final   Influenza B by PCR NEGATIVE NEGATIVE Final    Comment: (NOTE) The Xpert Xpress SARS-CoV-2/FLU/RSV assay is intended as an aid in  the diagnosis of influenza from Nasopharyngeal swab specimens and  should not be used as a sole basis for treatment. Nasal washings and  aspirates are unacceptable for Xpert Xpress SARS-CoV-2/FLU/RSV  testing.  Fact Sheet for Patients: PinkCheek.be  Fact Sheet for Healthcare Providers: GravelBags.it  This test is not yet approved or cleared by the [place] FDA and  has been authorized for detection and/or diagnosis of SARS-CoV-2 by  FDA under an Emergency Use Authorization (EUA). This EUA will remain  in effect (meaning this test can be used) for the duration of the  Covid-19 declaration under Section 564(b)(1) of the Act, 21  U.S.C. section 360bbb-3(b)(1), unless the authorization is  terminated or revoked. Performed at [facility], [place] [address], [place], [place] [address]      Time coordinating discharge:  Over 30 minutes  SIGNED:   Little Ishikawa, DO [facility] 10/21/2020, 11:52 AM Pager   If 7PM-7AM, please contact night-coverage [email or web address]

## 2020-10-21 NOTE — TOC Transition Note (Signed)
Transition of Care [facility]) - CM/SW Discharge Note   Patient Details  Name: Brandon [name] MRN: [id number] Date of Birth: [birth year]  [facility] CM/SW Contact:  Geralynn Ochs, LCSW Phone Number: 10/21/2020, 2:01 PM   Clinical Narrative:   Nurse to call report to [phone], Room 103    Final next level of care: Skilled Nursing Facility Barriers to Discharge: Barriers Resolved   Patient Goals and CMS Choice Patient states their goals for this hospitalization and ongoing recovery are:: to get rehab CMS Medicare.gov Compare Post Acute Care list provided to:: Patient Choice offered to / list presented to : Patient, Adult Children  Discharge Placement              Patient chooses bed at: [facility] Patient to be transferred to facility by: [facility] Name of family member notified: Self, daughter Patient and family notified of of transfer: 10/21/20  Discharge Plan and Services     Post Acute Care Choice: [place]                               Social Determinants of Health (SDOH) Interventions     Readmission Risk Interventions No flowsheet data found.

## 2020-11-01 ENCOUNTER — Telehealth: Payer: Self-pay | Admitting: *Deleted

## 2020-11-01 ENCOUNTER — Encounter: Payer: Self-pay | Admitting: *Deleted

## 2020-11-01 NOTE — Telephone Encounter (Signed)
Patient enrolled for [employer] to ship a 30 day cardiac event monitor to his home per patient request. Letter with instructions mailed to patient.

## 2020-11-23 ENCOUNTER — Encounter: Payer: Self-pay | Admitting: Adult Health

## 2020-11-23 ENCOUNTER — Inpatient Hospital Stay: Payer: Self-pay | Admitting: Adult Health

## 2020-11-23 NOTE — Progress Notes (Deleted)
[facility] [address] [place]. [place] [address] [phone]       [place]  Brandon [name] Date of Birth:  1946-07-18 Medical Record Number:  [id number]   Reason for Referral:  hospital stroke follow up    SUBJECTIVE:   CHIEF COMPLAINT:  No chief complaint on file.   HPI:   [name] is a 74 y.o. male with history of type 2 diabetes, peripheral vascular disease, paroxysmal atrial tachycardia, essential hypertension, tobacco abuse and chronic alcoholism who presented on 10/13/2020 with SOB, sudden onset non-dermatomal LLE weakness w/ B pedal edema and stock distribution sensory deficit.  Personally reviewed hospitalization pertinent progress notes, lab work and imaging with summary provided.  Evaluated by Dr. Leonie Man with stroke work-up revealing b/l ACA, L MCA/ACA and MCA/PCA punctate infarcts, embolic secondary to unknown source concerning for occult PAF.  Recommended 30-day cardiac event monitor outpatient to assess for atrial fibrillation and if negative consider ILR.  On aspirin and Pletal PTA and recommended aspirin, Pletal and Plavix for 3 weeks then Plavix and Pletal alone.  HTN stable.  LDL 42 and resumed home dose atorvastatin 40 mg daily.  Controlled DM with A1c 6.1.  Other stroke risk factors include atrial tachycardia, microcytic anemia, current tobacco use, advanced age, chronic EtOH use, CAD s/p stent, and PVD of pletal PTA.  No prior stroke history.  Other active problems include acute on chronic hypoxic respiratory failure d/t COPD exacerbation, hyperkalemia, prostate cancer, ascending thoracic aortic aneurysm, GERD and mild hyponatremia.  Evaluated by therapies who recommended discharge to SNF for functional decline and ongoing therapy needs  Stroke:  B/l ACAs, left MCA/ACA and MCA/PCA punctate infarcts, embolic secondary to unknown source, concerning for occult PAF  CT head small R corona radiata hypodensity. Mild Small  vessel disease. Mild Atrophy. R mastoid effusion w/ dehiscence  CTA head & neck Unremarkable   MRI  multiple small B infarct as above. Small vessel disease. Atrophy.   2D Echo EF 60-65%   Recommend 30d cardiac monitor to look for AF as outpt. If negative, will consider loop recorder  LDL - 42  HgbA1c - 6.1  VTE prophylaxis - SCDs   aspirin 81 mg daily and pletal prior to admission, now on aspirin, pletal and plavix for 3 weeks. After 3 weeks, continue Plavix and pletal.    Therapy recommendations:  SNF  Disposition:  SNF     ROS:   14 system review of systems performed and negative with exception of ***  PMH:  Past Medical History:  Diagnosis Date  . Abdominal aortic aneurysm without rupture ([place])   . CAD (coronary artery disease)    severe on chest CT   . Chronic alcoholism ([place])   . Chronic idiopathic constipation   . Chronic pancreatitis ([place])   . Chronic respiratory failure with hypoxia ([place])   . Essential hypertension   . GERD (gastroesophageal reflux disease)   . Hyperaldosteronism ([place])   . Malignant neoplasm of prostate ([place])   . Nicotine dependence   . Paroxysmal atrial tachycardia ([place])   . Peripheral vascular disease ([place])   . Serrated polyp of colon   . Type 2 diabetes mellitus ([facility])     PSH:  Past Surgical History:  Procedure Laterality Date  . PROSTATECTOMY  12/2012    Social History:  Social History   Socioeconomic History  . Marital status: Divorced    Spouse name: Not on file  . Number of children: Not  on file  . Years of education: Not on file  . Highest education level: Not on file  Occupational History  . Not on file  Tobacco Use  . Smoking status: Current Every Day Smoker    Packs/day: 0.50    Types: Cigarettes  . Smokeless tobacco: Never Used  Substance and Sexual Activity  . Alcohol use: Not Currently  . Drug use: Not on file  . Sexual activity: Not on file  Other Topics Concern  . Not on file  Social History Narrative   . Not on file   Social Determinants of Health   Financial Resource Strain:   . Difficulty of Paying Living Expenses: Not on file  Food Insecurity:   . Worried About Charity fundraiser in the Last Year: Not on file  . Ran Out of Food in the Last Year: Not on file  Transportation Needs:   . Lack of Transportation (Medical): Not on file  . Lack of Transportation (Non-Medical): Not on file  Physical Activity:   . Days of Exercise per Week: Not on file  . Minutes of Exercise per Session: Not on file  Stress:   . Feeling of Stress : Not on file  Social Connections:   . Frequency of Communication with Friends and Family: Not on file  . Frequency of Social Gatherings with Friends and Family: Not on file  . Attends Religious Services: Not on file  . Active Member of Clubs or Organizations: Not on file  . Attends Archivist Meetings: Not on file  . Marital Status: Not on file  Intimate Partner Violence:   . Fear of Current or Ex-Partner: Not on file  . Emotionally Abused: Not on file  . Physically Abused: Not on file  . Sexually Abused: Not on file    Family History:  Family History  Problem Relation Age of Onset  . Hypertension Mother   . Hypertension Father     Medications:   Current Outpatient Medications on File Prior to Visit  Medication Sig Dispense Refill  . albuterol (VENTOLIN HFA) 108 (90 Base) MCG/ACT inhaler Inhale 2 puffs into the lungs every 6 (six) hours as needed for wheezing or shortness of breath.     Marland Kitchen aspirin 81 MG EC tablet Take 1 tablet (81 mg total) by mouth daily. 21 tablet 0  . atorvastatin (LIPITOR) 40 MG tablet Take 40 mg by mouth at bedtime.     . cilostazol (PLETAL) 50 MG tablet Take 1 tablet (50 mg total) by mouth 2 (two) times daily. 60 tablet 1  . clopidogrel (PLAVIX) 75 MG tablet Take 1 tablet (75 mg total) by mouth daily. 30 tablet 1  . ferrous sulfate 325 (65 FE) MG tablet Take 1 tablet (325 mg total) by mouth 3 (three) times daily  with meals. 90 tablet 3  . Fluticasone-Salmeterol (ADVAIR) 250-50 MCG/DOSE AEPB Inhale 1 puff into the lungs 2 (two) times daily.    . insulin aspart protamine - aspart (NOVOLOG 70/30 MIX) (70-30) 100 UNIT/ML FlexPen Inject 45 Units into the skin 2 (two) times daily with a meal.     . KETOROLAC TROMETHAMINE, PF, OP Apply 1 drop to eye in the morning, at noon, in the evening, and at bedtime.    . metFORMIN (GLUCOPHAGE) 1000 MG tablet Take 1,000 mg by mouth 2 (two) times daily with a meal.     . metoprolol succinate (TOPROL-XL) 50 MG 24 hr tablet Take 3 tablets (150 mg total) by  mouth 2 (two) times daily in the am and at bedtime.. Take with or immediately following a meal. 180 tablet 0  . nortriptyline (PAMELOR) 50 MG capsule Take 50 mg by mouth at bedtime.     . Omega-3 1000 MG CAPS Take 1,000 mg by mouth in the morning and at bedtime.     Marland Kitchen omeprazole (PRILOSEC) 20 MG capsule Take 20 mg by mouth daily.     Marland Kitchen oxybutynin (DITROPAN-XL) 10 MG 24 hr tablet Take 10 mg by mouth daily.    . Tiotropium Bromide Monohydrate 2.5 MCG/ACT AERS Inhale 2 puffs into the lungs daily.     No current facility-administered medications on file prior to visit.    Allergies:   Allergies  Allergen Reactions  . Furosemide Injection [Furosemide] Other (See Comments)    swelling  . Hctz [Hydrochlorothiazide]       OBJECTIVE:  Physical Exam  There were no vitals filed for this visit. There is no height or weight on file to calculate BMI. No exam data present  No flowsheet data found.   General: well developed, well nourished, seated, in no evident distress Head: head normocephalic and atraumatic.   Neck: supple with no carotid or supraclavicular bruits Cardiovascular: regular rate and rhythm, no murmurs Musculoskeletal: no deformity Skin:  no rash/petichiae Vascular:  Normal pulses all extremities   Neurologic Exam Mental Status: Awake and fully alert. Oriented to place and time. Recent and remote  memory intact. Attention span, concentration and fund of knowledge appropriate. Mood and affect appropriate.  Cranial Nerves: Fundoscopic exam reveals sharp disc margins. Pupils equal, briskly reactive to light. Extraocular movements full without nystagmus. Visual fields full to confrontation. Hearing intact. Facial sensation intact. Face, tongue, palate moves normally and symmetrically.  Motor: Normal bulk and tone. Normal strength in all tested extremity muscles. Sensory.: intact to touch , pinprick , position and vibratory sensation.  Coordination: Rapid alternating movements normal in all extremities. Finger-to-nose and heel-to-shin performed accurately bilaterally. Gait and Station: Arises from chair without difficulty. Stance is normal. Gait demonstrates normal stride length and balance Reflexes: 1+ and symmetric. Toes downgoing.     NIHSS  *** Modified Rankin  ***      ASSESSMENT: Brandon [name] is a 74 y.o. year old male presented with SOB, sudden onset non-dermatomal LLE weakness w/ b/l pedal edema and stocking distribution sensory deficit on 10/13/2020 with stroke work-up revealing b/l ACA, L MCA/ACA and MCA/PCA punctate infarcts, embolic secondary to unknown source concerning for occult PAF. Vascular risk factors include atrial tachycardia, DM, HTN, HLD, PVD, CAD s/p stent, current tobacco use and chronic EtOH use.      PLAN:  1. Cryptogenic stroke: Residual deficit: ***.  Complete 30-day cardiac event monitor.  Continue clopidogrel 75 mg daily and pletal  and atorvastatin 40 mg daily for secondary stroke prevention.  Discussed secondary stroke prevention measures and importance of close PCP follow up for aggressive stroke risk factor management  2. HTN: BP goal <130/90. Continue f/u with PCP 3. HLD: LDL goal <70. Recent LDL 42.  On atorvastatin per PCP 4. DMII: A1c goal<7.0. Recent A1c 6.1. F/u with PCP 5. Tobacco use:    Follow up in *** or call earlier if needed   I  spent *** minutes of face-to-face and non-face-to-face time with patient.  This included previsit chart review, lab review, study review, order entry, electronic health record documentation, patient education regarding recent stroke, residual deficits, importance of managing stroke risk factors and answered all  questions to patient satisfaction     Frann Rider, [facility]  [facility] [address] [place], [place] [address]  Phone [phone] Fax [phone] Note: This document was prepared with digital dictation and possible smart phrase technology. Any transcriptional errors that result from this process are unintentional.

## 2021-01-07 ENCOUNTER — Emergency Department (HOSPITAL_COMMUNITY): Payer: No Typology Code available for payment source

## 2021-01-07 ENCOUNTER — Encounter (HOSPITAL_COMMUNITY): Payer: Self-pay

## 2021-01-07 ENCOUNTER — Inpatient Hospital Stay (HOSPITAL_COMMUNITY)
Admission: EM | Admit: 2021-01-07 | Discharge: 2021-01-14 | DRG: 981 | Disposition: A | Discharge status: Home Health | Payer: No Typology Code available for payment source | Attending: Internal Medicine | Admitting: Internal Medicine

## 2021-01-07 DIAGNOSIS — E1165 Type 2 diabetes mellitus with hyperglycemia: Secondary | ICD-10-CM | POA: Diagnosis present

## 2021-01-07 DIAGNOSIS — K219 Gastro-esophageal reflux disease without esophagitis: Secondary | ICD-10-CM | POA: Diagnosis not present

## 2021-01-07 DIAGNOSIS — E1151 Type 2 diabetes mellitus with diabetic peripheral angiopathy without gangrene: Secondary | ICD-10-CM | POA: Diagnosis present

## 2021-01-07 DIAGNOSIS — J9621 Acute and chronic respiratory failure with hypoxia: Secondary | ICD-10-CM | POA: Diagnosis present

## 2021-01-07 DIAGNOSIS — I5032 Chronic diastolic (congestive) heart failure: Secondary | ICD-10-CM | POA: Diagnosis present

## 2021-01-07 DIAGNOSIS — E538 Deficiency of other specified B group vitamins: Secondary | ICD-10-CM | POA: Diagnosis present

## 2021-01-07 DIAGNOSIS — E269 Hyperaldosteronism, unspecified: Secondary | ICD-10-CM | POA: Diagnosis present

## 2021-01-07 DIAGNOSIS — Z720 Tobacco use: Secondary | ICD-10-CM | POA: Diagnosis present

## 2021-01-07 DIAGNOSIS — E1122 Type 2 diabetes mellitus with diabetic chronic kidney disease: Secondary | ICD-10-CM | POA: Diagnosis present

## 2021-01-07 DIAGNOSIS — Z9981 Dependence on supplemental oxygen: Secondary | ICD-10-CM | POA: Diagnosis not present

## 2021-01-07 DIAGNOSIS — Z955 Presence of coronary angioplasty implant and graft: Secondary | ICD-10-CM

## 2021-01-07 DIAGNOSIS — Z20822 Contact with and (suspected) exposure to covid-19: Secondary | ICD-10-CM | POA: Diagnosis present

## 2021-01-07 DIAGNOSIS — I129 Hypertensive chronic kidney disease with stage 1 through stage 4 chronic kidney disease, or unspecified chronic kidney disease: Secondary | ICD-10-CM | POA: Diagnosis present

## 2021-01-07 DIAGNOSIS — Z888 Allergy status to other drugs, medicaments and biological substances status: Secondary | ICD-10-CM | POA: Diagnosis not present

## 2021-01-07 DIAGNOSIS — I2729 Other secondary pulmonary hypertension: Secondary | ICD-10-CM | POA: Diagnosis not present

## 2021-01-07 DIAGNOSIS — E1159 Type 2 diabetes mellitus with other circulatory complications: Secondary | ICD-10-CM | POA: Diagnosis not present

## 2021-01-07 DIAGNOSIS — E440 Moderate protein-calorie malnutrition: Secondary | ICD-10-CM | POA: Diagnosis present

## 2021-01-07 DIAGNOSIS — J441 Chronic obstructive pulmonary disease with (acute) exacerbation: Secondary | ICD-10-CM | POA: Diagnosis present

## 2021-01-07 DIAGNOSIS — Z681 Body mass index (BMI) 19 or less, adult: Secondary | ICD-10-CM | POA: Diagnosis not present

## 2021-01-07 DIAGNOSIS — Z87891 Personal history of nicotine dependence: Secondary | ICD-10-CM

## 2021-01-07 DIAGNOSIS — N179 Acute kidney failure, unspecified: Secondary | ICD-10-CM | POA: Diagnosis present

## 2021-01-07 DIAGNOSIS — Z8249 Family history of ischemic heart disease and other diseases of the circulatory system: Secondary | ICD-10-CM

## 2021-01-07 DIAGNOSIS — I714 Abdominal aortic aneurysm, without rupture: Secondary | ICD-10-CM | POA: Diagnosis present

## 2021-01-07 DIAGNOSIS — E11649 Type 2 diabetes mellitus with hypoglycemia without coma: Secondary | ICD-10-CM | POA: Diagnosis present

## 2021-01-07 DIAGNOSIS — E871 Hypo-osmolality and hyponatremia: Secondary | ICD-10-CM | POA: Diagnosis present

## 2021-01-07 DIAGNOSIS — I2721 Secondary pulmonary arterial hypertension: Secondary | ICD-10-CM | POA: Diagnosis present

## 2021-01-07 DIAGNOSIS — I471 Supraventricular tachycardia: Secondary | ICD-10-CM | POA: Diagnosis present

## 2021-01-07 DIAGNOSIS — Y929 Unspecified place or not applicable: Secondary | ICD-10-CM | POA: Diagnosis not present

## 2021-01-07 DIAGNOSIS — Z8673 Personal history of transient ischemic attack (TIA), and cerebral infarction without residual deficits: Secondary | ICD-10-CM

## 2021-01-07 DIAGNOSIS — R0603 Acute respiratory distress: Secondary | ICD-10-CM | POA: Diagnosis not present

## 2021-01-07 DIAGNOSIS — D649 Anemia, unspecified: Secondary | ICD-10-CM | POA: Diagnosis present

## 2021-01-07 DIAGNOSIS — K861 Other chronic pancreatitis: Secondary | ICD-10-CM | POA: Diagnosis present

## 2021-01-07 DIAGNOSIS — I2510 Atherosclerotic heart disease of native coronary artery without angina pectoris: Secondary | ICD-10-CM | POA: Diagnosis present

## 2021-01-07 DIAGNOSIS — Z8546 Personal history of malignant neoplasm of prostate: Secondary | ICD-10-CM | POA: Diagnosis not present

## 2021-01-07 DIAGNOSIS — Z794 Long term (current) use of insulin: Secondary | ICD-10-CM

## 2021-01-07 DIAGNOSIS — L8990 Pressure ulcer of unspecified site, unspecified stage: Secondary | ICD-10-CM | POA: Insufficient documentation

## 2021-01-07 DIAGNOSIS — Z7982 Long term (current) use of aspirin: Secondary | ICD-10-CM

## 2021-01-07 DIAGNOSIS — R7989 Other specified abnormal findings of blood chemistry: Secondary | ICD-10-CM | POA: Diagnosis not present

## 2021-01-07 DIAGNOSIS — Z79899 Other long term (current) drug therapy: Secondary | ICD-10-CM

## 2021-01-07 DIAGNOSIS — I712 Thoracic aortic aneurysm, without rupture: Secondary | ICD-10-CM | POA: Diagnosis present

## 2021-01-07 DIAGNOSIS — E785 Hyperlipidemia, unspecified: Secondary | ICD-10-CM | POA: Diagnosis present

## 2021-01-07 DIAGNOSIS — F1020 Alcohol dependence, uncomplicated: Secondary | ICD-10-CM | POA: Diagnosis present

## 2021-01-07 DIAGNOSIS — I10 Essential (primary) hypertension: Secondary | ICD-10-CM

## 2021-01-07 DIAGNOSIS — I351 Nonrheumatic aortic (valve) insufficiency: Secondary | ICD-10-CM | POA: Diagnosis not present

## 2021-01-07 DIAGNOSIS — E119 Type 2 diabetes mellitus without complications: Secondary | ICD-10-CM

## 2021-01-07 DIAGNOSIS — R778 Other specified abnormalities of plasma proteins: Secondary | ICD-10-CM | POA: Diagnosis not present

## 2021-01-07 DIAGNOSIS — I2583 Coronary atherosclerosis due to lipid rich plaque: Secondary | ICD-10-CM | POA: Diagnosis not present

## 2021-01-07 DIAGNOSIS — I214 Non-ST elevation (NSTEMI) myocardial infarction: Secondary | ICD-10-CM | POA: Diagnosis present

## 2021-01-07 DIAGNOSIS — N182 Chronic kidney disease, stage 2 (mild): Secondary | ICD-10-CM | POA: Diagnosis present

## 2021-01-07 DIAGNOSIS — T380X5A Adverse effect of glucocorticoids and synthetic analogues, initial encounter: Secondary | ICD-10-CM | POA: Diagnosis present

## 2021-01-07 DIAGNOSIS — Z7902 Long term (current) use of antithrombotics/antiplatelets: Secondary | ICD-10-CM

## 2021-01-07 DIAGNOSIS — D509 Iron deficiency anemia, unspecified: Secondary | ICD-10-CM | POA: Diagnosis present

## 2021-01-07 DIAGNOSIS — I361 Nonrheumatic tricuspid (valve) insufficiency: Secondary | ICD-10-CM | POA: Diagnosis not present

## 2021-01-07 LAB — I-STAT VENOUS BLOOD GAS, ED
Acid-base deficit: 6 mmol/L — ABNORMAL HIGH (ref 0.0–2.0)
Bicarbonate: 16.7 mmol/L — ABNORMAL LOW (ref 20.0–28.0)
Calcium, Ion: 1.08 mmol/L — ABNORMAL LOW (ref 1.15–1.40)
HCT: 38 % — ABNORMAL LOW (ref 39.0–52.0)
Hemoglobin: 12.9 g/dL — ABNORMAL LOW (ref 13.0–17.0)
O2 Saturation: 79 %
Potassium: 4.8 mmol/L (ref 3.5–5.1)
Sodium: 134 mmol/L — ABNORMAL LOW (ref 135–145)
TCO2: 18 mmol/L — ABNORMAL LOW (ref 22–32)
pCO2, Ven: 25.8 mmHg — ABNORMAL LOW (ref 44.0–60.0)
pH, Ven: 7.421 (ref 7.250–7.430)
pO2, Ven: 42 mmHg (ref 32.0–45.0)

## 2021-01-07 LAB — URINALYSIS, ROUTINE W REFLEX MICROSCOPIC
Bilirubin Urine: NEGATIVE
Glucose, UA: NEGATIVE mg/dL
Hgb urine dipstick: NEGATIVE
Ketones, ur: NEGATIVE mg/dL
Leukocytes,Ua: NEGATIVE
Nitrite: NEGATIVE
Protein, ur: NEGATIVE mg/dL
Specific Gravity, Urine: 1.006 (ref 1.005–1.030)
pH: 5 (ref 5.0–8.0)

## 2021-01-07 LAB — CBC WITH DIFFERENTIAL/PLATELET
Abs Immature Granulocytes: 0.03 10*3/uL (ref 0.00–0.07)
Basophils Absolute: 0 10*3/uL (ref 0.0–0.1)
Basophils Relative: 0 %
Eosinophils Absolute: 0 10*3/uL (ref 0.0–0.5)
Eosinophils Relative: 0 %
HCT: 38.3 % — ABNORMAL LOW (ref 39.0–52.0)
Hemoglobin: 11.9 g/dL — ABNORMAL LOW (ref 13.0–17.0)
Immature Granulocytes: 0 %
Lymphocytes Relative: 12 %
Lymphs Abs: 1.1 10*3/uL (ref 0.7–4.0)
MCH: 24.7 pg — ABNORMAL LOW (ref 26.0–34.0)
MCHC: 31.1 g/dL (ref 30.0–36.0)
MCV: 79.5 fL — ABNORMAL LOW (ref 80.0–100.0)
Monocytes Absolute: 0.5 10*3/uL (ref 0.1–1.0)
Monocytes Relative: 6 %
Neutro Abs: 7.4 10*3/uL (ref 1.7–7.7)
Neutrophils Relative %: 82 %
Platelets: 341 10*3/uL (ref 150–400)
RBC: 4.82 MIL/uL (ref 4.22–5.81)
RDW: 21.9 % — ABNORMAL HIGH (ref 11.5–15.5)
WBC: 9.1 10*3/uL (ref 4.0–10.5)
nRBC: 0 % (ref 0.0–0.2)

## 2021-01-07 LAB — RESP PANEL BY RT-PCR (FLU A&B, COVID) ARPGX2
Influenza A by PCR: NEGATIVE
Influenza B by PCR: NEGATIVE
SARS Coronavirus 2 by RT PCR: NEGATIVE

## 2021-01-07 LAB — COMPREHENSIVE METABOLIC PANEL
ALT: 17 U/L (ref 0–44)
AST: 20 U/L (ref 15–41)
Albumin: 3.4 g/dL — ABNORMAL LOW (ref 3.5–5.0)
Alkaline Phosphatase: 54 U/L (ref 38–126)
Anion gap: 13 (ref 5–15)
BUN: 18 mg/dL (ref 8–23)
CO2: 15 mmol/L — ABNORMAL LOW (ref 22–32)
Calcium: 9 mg/dL (ref 8.9–10.3)
Chloride: 103 mmol/L (ref 98–111)
Creatinine, Ser: 1.25 mg/dL — ABNORMAL HIGH (ref 0.61–1.24)
GFR, Estimated: >60 mL/min (ref >60)
Glucose, Bld: 150 mg/dL — ABNORMAL HIGH (ref 70–99)
Potassium: 5 mmol/L (ref 3.5–5.1)
Sodium: 131 mmol/L — ABNORMAL LOW (ref 135–145)
Total Bilirubin: 0.4 mg/dL (ref 0.3–1.2)
Total Protein: 6.5 g/dL (ref 6.5–8.1)

## 2021-01-07 LAB — CBG MONITORING, ED: Glucose-Capillary: 175 mg/dL — ABNORMAL HIGH (ref 70–99)

## 2021-01-07 LAB — CREATININE, URINE, RANDOM: Creatinine, Urine: 31.7 mg/dL

## 2021-01-07 LAB — SODIUM, URINE, RANDOM: Sodium, Ur: 37 mmol/L

## 2021-01-07 LAB — HEMOGLOBIN A1C
Hgb A1c MFr Bld: 6.4 % — ABNORMAL HIGH (ref 4.8–5.6)
Mean Plasma Glucose: 136.98 mg/dL

## 2021-01-07 LAB — TROPONIN I (HIGH SENSITIVITY)
Troponin I (High Sensitivity): 49 ng/L — ABNORMAL HIGH (ref <18)
Troponin I (High Sensitivity): 39 ng/L — ABNORMAL HIGH (ref <18)

## 2021-01-07 LAB — BRAIN NATRIURETIC PEPTIDE: B Natriuretic Peptide: 337.4 pg/mL — ABNORMAL HIGH (ref 0.0–100.0)

## 2021-01-07 LAB — POC SARS CORONAVIRUS 2 AG -  ED: SARS Coronavirus 2 Ag: NEGATIVE

## 2021-01-07 LAB — MAGNESIUM: Magnesium: 1.5 mg/dL — ABNORMAL LOW (ref 1.7–2.4)

## 2021-01-07 MED ORDER — PREDNISONE 20 MG PO TABS
40.0000 mg | ORAL_TABLET | Freq: Every day | ORAL | Status: AC
  Administered 2021-01-09 – 2021-01-12 (×4): 40 mg via ORAL
  Filled 2021-01-07 (×4): qty 2

## 2021-01-07 MED ORDER — PANTOPRAZOLE SODIUM 40 MG PO TBEC
40.0000 mg | DELAYED_RELEASE_TABLET | Freq: Every day | ORAL | Status: DC
  Administered 2021-01-08 – 2021-01-14 (×7): 40 mg via ORAL
  Filled 2021-01-07 (×7): qty 1

## 2021-01-07 MED ORDER — ACETAMINOPHEN 650 MG RE SUPP: 650.0000 mg | Freq: Four times a day (QID) | RECTAL | Status: DC | PRN

## 2021-01-07 MED ORDER — SENNOSIDES-DOCUSATE SODIUM 8.6-50 MG PO TABS
1.0000 | ORAL_TABLET | Freq: Every evening | ORAL | Status: DC | PRN
  Filled 2021-01-07: qty 1

## 2021-01-07 MED ORDER — ATORVASTATIN CALCIUM 40 MG PO TABS
40.0000 mg | ORAL_TABLET | Freq: Every day | ORAL | Status: DC
  Administered 2021-01-08 – 2021-01-13 (×6): 40 mg via ORAL
  Filled 2021-01-07 (×7): qty 1

## 2021-01-07 MED ORDER — ALBUTEROL (5 MG/ML) CONTINUOUS INHALATION SOLN
10.0000 mg/h | INHALATION_SOLUTION | Freq: Once | RESPIRATORY_TRACT | Status: AC
  Administered 2021-01-07: 10 mg/h via RESPIRATORY_TRACT
  Filled 2021-01-07: qty 20

## 2021-01-07 MED ORDER — SODIUM CHLORIDE 0.9 % IV SOLN
75.0000 mL/h | INTRAVENOUS | Status: DC
  Administered 2021-01-07: 75 mL/h via INTRAVENOUS

## 2021-01-07 MED ORDER — ASPIRIN 81 MG PO CHEW
81.0000 mg | CHEWABLE_TABLET | Freq: Every day | ORAL | Status: DC
  Administered 2021-01-08 – 2021-01-14 (×5): 81 mg via ORAL
  Filled 2021-01-07 (×5): qty 1

## 2021-01-07 MED ORDER — INSULIN GLARGINE 100 UNIT/ML ~~LOC~~ SOLN
30.0000 [IU] | Freq: Every day | SUBCUTANEOUS | Status: DC
  Administered 2021-01-09: 30 [IU] via SUBCUTANEOUS
  Filled 2021-01-07 (×3): qty 0.3

## 2021-01-07 MED ORDER — METHYLPREDNISOLONE SODIUM SUCC 125 MG IJ SOLR
125.0000 mg | Freq: Once | INTRAMUSCULAR | Status: AC
  Administered 2021-01-07: 125 mg via INTRAVENOUS

## 2021-01-07 MED ORDER — TIOTROPIUM BROMIDE MONOHYDRATE 2.5 MCG/ACT IN AERS: 2.0000 | INHALATION_SPRAY | Freq: Every day | RESPIRATORY_TRACT | Status: DC

## 2021-01-07 MED ORDER — NORTRIPTYLINE HCL 25 MG PO CAPS
50.0000 mg | ORAL_CAPSULE | Freq: Every day | ORAL | Status: DC
  Administered 2021-01-08 – 2021-01-13 (×6): 50 mg via ORAL
  Filled 2021-01-07 (×7): qty 2

## 2021-01-07 MED ORDER — ACETAMINOPHEN 325 MG PO TABS
650.0000 mg | ORAL_TABLET | Freq: Four times a day (QID) | ORAL | Status: DC | PRN
  Administered 2021-01-11: 650 mg via ORAL
  Filled 2021-01-07: qty 2

## 2021-01-07 MED ORDER — BISOPROLOL FUMARATE 5 MG PO TABS
5.0000 mg | ORAL_TABLET | Freq: Every day | ORAL | Status: DC
  Administered 2021-01-08 – 2021-01-14 (×7): 5 mg via ORAL
  Filled 2021-01-07 (×8): qty 1

## 2021-01-07 MED ORDER — IPRATROPIUM-ALBUTEROL 0.5-2.5 (3) MG/3ML IN SOLN
3.0000 mL | Freq: Four times a day (QID) | RESPIRATORY_TRACT | Status: DC
  Administered 2021-01-08 (×3): 3 mL via RESPIRATORY_TRACT
  Filled 2021-01-07 (×4): qty 3

## 2021-01-07 MED ORDER — CLOPIDOGREL BISULFATE 75 MG PO TABS
75.0000 mg | ORAL_TABLET | Freq: Every day | ORAL | Status: DC
  Administered 2021-01-08 – 2021-01-14 (×7): 75 mg via ORAL
  Filled 2021-01-07 (×7): qty 1

## 2021-01-07 MED ORDER — IPRATROPIUM BROMIDE 0.02 % IN SOLN
0.5000 mg | Freq: Once | RESPIRATORY_TRACT | Status: AC
  Administered 2021-01-07: 0.5 mg via RESPIRATORY_TRACT
  Filled 2021-01-07: qty 2.5

## 2021-01-07 MED ORDER — INSULIN ASPART 100 UNIT/ML ~~LOC~~ SOLN
0.0000 [IU] | Freq: Every day | SUBCUTANEOUS | Status: DC
  Administered 2021-01-08: 3 [IU] via SUBCUTANEOUS
  Administered 2021-01-09: 4 [IU] via SUBCUTANEOUS
  Administered 2021-01-10: 2 [IU] via SUBCUTANEOUS
  Administered 2021-01-13: 3 [IU] via SUBCUTANEOUS

## 2021-01-07 MED ORDER — HYDROCODONE-ACETAMINOPHEN 5-325 MG PO TABS: 1.0000 | ORAL_TABLET | ORAL | Status: DC | PRN

## 2021-01-07 MED ORDER — SODIUM CHLORIDE 0.9 % IV SOLN
1.0000 g | INTRAVENOUS | Status: AC
  Administered 2021-01-07 – 2021-01-11 (×5): 1 g via INTRAVENOUS
  Filled 2021-01-07 (×5): qty 10

## 2021-01-07 MED ORDER — BISACODYL 5 MG PO TBEC
5.0000 mg | DELAYED_RELEASE_TABLET | Freq: Every day | ORAL | Status: DC | PRN
  Administered 2021-01-09: 5 mg via ORAL
  Filled 2021-01-07 (×2): qty 1

## 2021-01-07 MED ORDER — MOMETASONE FURO-FORMOTEROL FUM 200-5 MCG/ACT IN AERO
2.0000 | INHALATION_SPRAY | Freq: Two times a day (BID) | RESPIRATORY_TRACT | Status: DC
  Administered 2021-01-09 – 2021-01-14 (×10): 2 via RESPIRATORY_TRACT
  Filled 2021-01-07 (×2): qty 8.8

## 2021-01-07 MED ORDER — INSULIN ASPART 100 UNIT/ML ~~LOC~~ SOLN
0.0000 [IU] | Freq: Three times a day (TID) | SUBCUTANEOUS | Status: DC
  Administered 2021-01-08 (×2): 5 [IU] via SUBCUTANEOUS
  Administered 2021-01-08: 13:00:00 7 [IU] via SUBCUTANEOUS
  Administered 2021-01-09: 09:00:00 3 [IU] via SUBCUTANEOUS
  Administered 2021-01-09 (×2): 5 [IU] via SUBCUTANEOUS
  Administered 2021-01-10 – 2021-01-11 (×4): 3 [IU] via SUBCUTANEOUS
  Administered 2021-01-11: 5 [IU] via SUBCUTANEOUS
  Administered 2021-01-12: 08:00:00 2 [IU] via SUBCUTANEOUS
  Administered 2021-01-12: 9 [IU] via SUBCUTANEOUS
  Administered 2021-01-12: 13:00:00 7 [IU] via SUBCUTANEOUS
  Administered 2021-01-13: 2 [IU] via SUBCUTANEOUS
  Administered 2021-01-14: 3 [IU] via SUBCUTANEOUS

## 2021-01-07 MED ORDER — METHYLPREDNISOLONE SODIUM SUCC 125 MG IJ SOLR
60.0000 mg | Freq: Four times a day (QID) | INTRAMUSCULAR | Status: AC
  Administered 2021-01-08 – 2021-01-09 (×4): 60 mg via INTRAVENOUS
  Filled 2021-01-07 (×4): qty 2

## 2021-01-07 MED ORDER — ALBUTEROL SULFATE (2.5 MG/3ML) 0.083% IN NEBU
2.5000 mg | INHALATION_SOLUTION | RESPIRATORY_TRACT | Status: DC | PRN
  Administered 2021-01-08: 2.5 mg via RESPIRATORY_TRACT
  Filled 2021-01-07: qty 3

## 2021-01-07 MED ORDER — AMLODIPINE BESYLATE 10 MG PO TABS
10.0000 mg | ORAL_TABLET | Freq: Every day | ORAL | Status: DC
  Administered 2021-01-08: 10 mg via ORAL
  Filled 2021-01-07: qty 2

## 2021-01-07 MED ORDER — MAGNESIUM SULFATE IN D5W 1-5 GM/100ML-% IV SOLN
1.0000 g | Freq: Once | INTRAVENOUS | Status: AC
  Administered 2021-01-07: 1 g via INTRAVENOUS
  Filled 2021-01-07: qty 100

## 2021-01-07 MED ORDER — OXYBUTYNIN CHLORIDE ER 10 MG PO TB24
10.0000 mg | ORAL_TABLET | Freq: Every day | ORAL | Status: DC
Start: 2021-01-08 — End: 2021-01-14
  Administered 2021-01-09 – 2021-01-14 (×6): 10 mg via ORAL
  Filled 2021-01-07 (×9): qty 1

## 2021-01-07 NOTE — ED Provider Notes (Addendum)
Patient signed out to me by previous provider.  Briefly this is a 75 year old male with past medical history of COPD on 3 L, CHF who presented to the emergency department with shortness of breath and increased oxygen requirement.  He was reported to have decreased aeration and wheezing by previous provider.  Work-up was ordered, breathing treatments and steroids were administered.   Physical Exam  BP 130/70   Pulse (!) 102   Temp 98.3 F (36.8 C) (Axillary)   Resp 19   Ht 6\' 6"  (1.981 m)   Wt 95.7 kg   SpO2 92%   BMI 24.38 kg/m   Physical Exam  ED Course/Procedures     Procedures  MDM  On my evaluation patient has diminished breath sounds bilaterally with scattered wheezing, improving with nebulizer.  Patient feels mild improvement.  Blood work shows slightly elevated BNP and troponin, there is no active chest pain, he does appear to have mild chronic pitting edema of the lower extremities.  Chest x-ray shows chronic bilateral diffuse findings without any obvious acute change, he has had a lot of fibrotic changes on previous chest CT.  Patient has improved with nebulizer however continues to require increased oxygen.  He will require admission for what I believe is a COPD exacerbation with a possible component of CHF.  Given his pronounced respiratory exam I have lower suspicion for pulmonary embolism at this time. He is tachycardic now in the low 100s, most likely 2/2 breathing treatments.  With no active chest pain I think a primary cardiac ischemic event less likely.  Patients evaluation and results requires admission for further treatment and care. Patient agrees with admission plan, offers no new complaints and is stable/unchanged at time of admit.      Lorelle Gibbs, DO 01/07/21 1909    Lorelle Gibbs, DO 01/07/21 1912

## 2021-01-07 NOTE — ED Triage Notes (Signed)
Pt arrived via [facility] from home for SOBx2 days and weakness. Pt wears 4.5L 02 per [place] at baseline. EMS placed pt on NRB 10 L 02. Per EMS pt was using accessory muscles. Pt is grey in color. Pt A&Ox4. Pt is tachypneic. Pt is A-fib on the monitor

## 2021-01-07 NOTE — ED Provider Notes (Signed)
[facility] EMERGENCY DEPARTMENT Provider Note   CSN: [id number] Arrival date & time: 01/07/21  1403     History Chief Complaint  Patient presents with  . Respiratory Distress    Brandon [name] is a 75 y.o. male.  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  Patient reports that he started having shortness of breath couple of days ago.  At baseline under good days able to walk in the house, now is having difficulty with that activity.  He has no new cough.  He denies any sick contacts.  He has not received COVID-19 vaccination.  Patient denies any chest pain, fevers, chills, body aches.  Past Medical History:  Diagnosis Date  . 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 CAD (coronary artery disease)    severe on chest CT   . Chronic alcoholism ([place])   . Chronic idiopathic constipation   . Chronic pancreatitis ([place])   . Chronic respiratory failure with hypoxia ([place])   . Essential hypertension   . GERD (gastroesophageal reflux disease)   . Hyperaldosteronism ([place])   . Malignant neoplasm of prostate ([place])   . Nicotine dependence   . Paroxysmal atrial tachycardia ([place])   . Peripheral vascular disease ([place])   . Serrated polyp of colon   . Type 2 diabetes mellitus [facility])     Patient Active Problem List   Diagnosis Date Noted  . Essential hypertension   . Type 2 diabetes mellitus ([place])   . CAD (coronary artery disease)   . GERD (gastroesophageal reflux disease)   . Acute on chronic respiratory failure with hypoxemia ([place])   . COPD (chronic obstructive pulmonary disease) ([place])   . Tobacco abuse   . Hyponatremia   . Hyperkalemia   . Ischemic stroke [facility])     Past Surgical History:  Procedure Laterality Date  . PROSTATECTOMY  12/2012       Family History  Problem Relation Age of Onset  . Hypertension Mother   .  Hypertension Father     Social History   Tobacco Use  . Smoking status: Former Smoker    Packs/day: 0.50    Types: Cigarettes  . Smokeless tobacco: Never Used  Substance Use Topics  . Alcohol use: Not Currently  . Drug use: Never    Home Medications Prior to Admission medications   Medication Sig Start Date End Date Taking? Authorizing Provider  albuterol (VENTOLIN HFA) 108 (90 Base) MCG/ACT inhaler Inhale 2 puffs into the lungs every 6 (six) hours as needed for wheezing or shortness of breath.  11/24/19  Yes [provider]  amLODipine (NORVASC) 10 MG tablet Take 10 mg by mouth daily.   Yes [provider]  aspirin 81 MG EC tablet Take 1 tablet (81 mg total) by mouth daily. 10/21/20  Yes Little Ishikawa, MD  atorvastatin (LIPITOR) 40 MG tablet Take 40 mg by mouth at bedtime.  06/11/19  Yes [provider]  enalapril (VASOTEC) 20 MG tablet Take 20 mg by mouth 2 (two) times daily.   Yes [provider]  metFORMIN (GLUCOPHAGE) 1000 MG tablet Take 1,000 mg by mouth 2 (two) times daily with a meal.    Yes [provider]  metoprolol succinate (TOPROL-XL) 100 MG 24 hr tablet Take 100 mg by mouth in the morning and at bedtime. Take  with or immediately following a meal.   Yes [provider]  Omega-3 1000 MG CAPS Take 1,000 mg by mouth in the morning and at bedtime.    Yes [provider]  oxybutynin (DITROPAN-XL) 10 MG 24 hr tablet Take 10 mg by mouth daily.   Yes [provider]  cilostazol (PLETAL) 50 MG tablet Take 1 tablet (50 mg total) by mouth 2 (two) times daily. Patient not taking: Reported on 01/07/2021 10/21/20   Little Ishikawa, MD  clopidogrel (PLAVIX) 75 MG tablet Take 1 tablet (75 mg total) by mouth daily. 10/22/20   Little Ishikawa, MD  ferrous sulfate 325 (65 FE) MG tablet Take 1 tablet (325 mg total) by mouth 3 (three) times daily with meals. 10/21/20   Little Ishikawa, MD   Fluticasone-Salmeterol (ADVAIR) 250-50 MCG/DOSE AEPB Inhale 1 puff into the lungs 2 (two) times daily.    [provider]  insulin aspart protamine - aspart (NOVOLOG 70/30 MIX) (70-30) 100 UNIT/ML FlexPen Inject 45 Units into the skin 2 (two) times daily with a meal.  06/11/19   [provider]  KETOROLAC TROMETHAMINE, PF, OP Apply 1 drop to eye in the morning, at noon, in the evening, and at bedtime.    [provider]  metoprolol succinate (TOPROL-XL) 50 MG 24 hr tablet Take 3 tablets (150 mg total) by mouth 2 (two) times daily in the am and at bedtime.. Take with or immediately following a meal. 10/21/20 11/20/20  Little Ishikawa, MD  nortriptyline (PAMELOR) 50 MG capsule Take 50 mg by mouth at bedtime.     [provider]  omeprazole (PRILOSEC) 20 MG capsule Take 20 mg by mouth daily.     [provider]  Tiotropium Bromide Monohydrate 2.5 MCG/ACT AERS Inhale 2 puffs into the lungs daily.    [provider]    Allergies    Furosemide injection [furosemide] and Hctz [hydrochlorothiazide]  Review of Systems   Review of Systems  Constitutional: Positive for activity change.  Respiratory: Positive for shortness of breath.   Cardiovascular: Negative for chest pain.  Gastrointestinal: Negative for nausea and vomiting.  Allergic/Immunologic: Negative for immunocompromised state.  Hematological: Does not bruise/bleed easily.  All other systems reviewed and are negative.   Physical Exam Updated Vital Signs BP 108/70   Pulse 85   Temp 98.3 F (36.8 C) (Axillary)   Resp 15   Ht 6\' 6"  (1.981 m)   Wt 95.7 kg   SpO2 92%   BMI 24.38 kg/m   Physical Exam Vitals and nursing note reviewed.  Constitutional:      Appearance: He is well-developed.  HENT:     Head: Atraumatic.  Eyes:     Extraocular Movements: Extraocular movements intact.     Pupils: Pupils are equal, round, and reactive to light.  Cardiovascular:     Rate and  Rhythm: Normal rate.  Pulmonary:     Effort: Pulmonary effort is normal.  Musculoskeletal:     Cervical back: Neck supple.  Skin:    General: Skin is warm.  Neurological:     Mental Status: He is alert and oriented to person, place, and time.     ED Results / Procedures / Treatments   Labs (all labs ordered are listed, but only abnormal results are displayed) Labs Reviewed  COMPREHENSIVE METABOLIC PANEL - Abnormal; Notable for the following components:      Result Value   Sodium 131 (*)    CO2  15 (*)    Glucose, Bld 150 (*)    Creatinine, Ser 1.25 (*)    Albumin 3.4 (*)    All other components within normal limits  CBC WITH DIFFERENTIAL/PLATELET - Abnormal; Notable for the following components:   Hemoglobin 11.9 (*)    HCT 38.3 (*)    MCV 79.5 (*)    MCH 24.7 (*)    RDW 21.9 (*)    All other components within normal limits  BRAIN NATRIURETIC PEPTIDE - Abnormal; Notable for the following components:   B Natriuretic Peptide 337.4 (*)    All other components within normal limits  RESP PANEL BY RT-PCR (FLU A&B, COVID) ARPGX2  POC SARS CORONAVIRUS 2 AG -  ED    EKG EKG Interpretation  Date/Time:  Saturday January 07 2021 14:16:35 EST Ventricular Rate:  94 PR Interval:    QRS Duration: 94 QT Interval:  336 QTC Calculation: 414 R Axis:   91 Text Interpretation: Sinus rhythm Atrial premature complexes Right axis deviation Nonspecific T abnrm, anterolateral leads ST elevation, consider inferior injury No acute changes No significant change since last tracing Confirmed by Varney Biles [id number]) on 01/07/2021 2:50:49 PM   Radiology No results found.  Procedures .Critical Care Performed by: Varney Biles, MD Authorized by: Varney Biles, MD   Critical care provider statement:    Critical care time (minutes):  32   Critical care was necessary to treat or prevent imminent or life-threatening deterioration of the following conditions:  Respiratory failure   Critical  care was time spent personally by me on the following activities:  Discussions with consultants, evaluation of patient's response to treatment, examination of patient, ordering and performing treatments and interventions, ordering and review of laboratory studies, ordering and review of radiographic studies, pulse oximetry, re-evaluation of patient's condition, obtaining history from patient or surrogate and review of old charts   (including critical care time)  Medications Ordered in ED Medications  albuterol (PROVENTIL,VENTOLIN) solution continuous neb (has no administration in time range)  ipratropium (ATROVENT) nebulizer solution 0.5 mg (has no administration in time range)  methylPREDNISolone sodium succinate (SOLU-MEDROL) 125 mg/2 mL injection 125 mg (125 mg Intravenous Given 01/07/21 1507)    ED Course  I have reviewed the triage vital signs and the nursing notes.  Pertinent labs & imaging results that were available during my care of the patient were reviewed by me and considered in my medical decision making (see chart for details).    MDM Rules/Calculators/A&P                          75 year old male comes in with chief complaint of shortness of breath without worsening cough or chest pain.  She has history of CAD, CHF, advanced COPD.  Poor aeration diffusely, that could be just baseline.  He has reduced activity tolerance now.  Patient will receive nebulizer treatment along with basic lab work-up right now.  Dr. Dina Rich will follow up on the patient.  If he has not significantly improved then he will need admission to the hospital for COPD exacerbation.  Final Clinical Impression(s) / ED Diagnoses Final diagnoses:  COPD exacerbation ([place])  Acute on chronic respiratory failure with hypoxia [facility])    Rx / DC Orders ED Discharge Orders    None       Varney Biles, MD 01/07/21 1557

## 2021-01-07 NOTE — H&P (Signed)
Brandon [name] [id number] DOB: 02/06/1946 DOA: 01/07/2021     PCP: Merryl Hacker, No   [facility]  Dr. Havery Moros    Patient arrived to ER on 01/07/21 at 1403 Referred by Attending Horton, Alvin Critchley, DO   Patient coming from: home Lives alone,         Chief Complaint:  Chief Complaint  Patient presents with  . Respiratory Distress    HPI: Brandon [name] is a 75 y.o. 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, EtOH abuse, chronic pancreatitis, HTN, GERD, prostate CA, PAD, DM2, tobacco abuse     Presented with  2 day hx of SOB, at baseline able to walk but now gets to winded to walk, no cough or sick contacts Not vaccinated for COVID he would like to but had trouble with transportation Denies any chest pain  Reports no sick contacts He have had decreased PO intake recently Last BM was yesterday Reports abd distention chronic Infectious risk factors:  Reports  shortness of breath,      Has   NOt been vaccinated against COVID    Initial COVID TEST  NEGATIVE   Lab Results  Component Value Date   [place] 01/07/2021   [place] NEGATIVE 10/21/2020   [place] NEGATIVE 10/13/2020   Regarding pertinent Chronic problems:     Hyperlipidemia -  on statins Lipitor Lipid Panel     Component Value Date/Time   CHOL 92 10/14/2020 0126   TRIG 45 10/14/2020 0126   HDL 41 10/14/2020 0126   CHOLHDL 2.2 10/14/2020 0126   VLDL 9 10/14/2020 0126   [place] 42 10/14/2020 0126     HTN on NOrvasc,   toprolol   CAD  - On Aspirin, statin, betablocker, Plavix                    DM 2 -  Lab Results  Component Value Date   HGBA1C 6.1 (H) 10/14/2020   on insulin, PO meds      COPD - followed by pulmonology   on baseline oxygen  3L,      Hx of CVA - in October 2021  with/out residual deficits on  Asa 81 mg  Plavix      Chronic anemia - baseline hg Hemoglobin & Hematocrit   Recent Labs    10/14/20 0126 10/15/20 1450 01/07/21 1408  HGB 9.6* 10.1* 11.9*     While in ER: Noted have signifant wheezing  on 10 L of O2 Needed cont neb Given nebulizer,  CXR unchanged negative for COVId Slightly elevated trop 72    Hospitalist was called for admission for COPD exacerbation  The following Work up has been ordered so far:  Orders Placed This Encounter  Procedures  . Critical Care  . Resp Panel by RT-PCR (Flu A&B, Covid) Nasopharyngeal Swab  . DG Chest Port 1 View  . Comprehensive metabolic panel  . CBC WITH DIFFERENTIAL  . Brain natriuretic peptide  . Magnesium  . Initiate Carrier Fluid Protocol  . Consult for [place] Admission  ALL PATIENTS BEING ADMITTED/HAVING PROCEDURES NEED COVID-19 SCREENING  . POC SARS Coronavirus 2 Ag-ED - Nasal Swab  . EKG 12-Lead   Following Medications were ordered in ER: Medications  albuterol (PROVENTIL,VENTOLIN) solution continuous neb (10 mg/hr Nebulization Given 01/07/21 1616)  ipratropium (ATROVENT) nebulizer solution 0.5 mg (0.5 mg Nebulization Given 01/07/21 1616)  methylPREDNISolone sodium succinate (SOLU-MEDROL) 125 mg/2 mL injection 125 mg (125 mg Intravenous Given 01/07/21 1507)     Significant initial  Findings: Abnormal Labs Reviewed  COMPREHENSIVE METABOLIC PANEL - Abnormal; Notable for the following components:      Result Value   Sodium 131 (*)    CO2 15 (*)    Glucose, Bld 150 (*)    Creatinine, Ser 1.25 (*)    Albumin 3.4 (*)    All other components within normal limits  CBC WITH DIFFERENTIAL/PLATELET - Abnormal; Notable for the following components:   Hemoglobin 11.9 (*)    HCT 38.3 (*)    MCV 79.5 (*)    MCH 24.7 (*)    RDW 21.9 (*)    All other components within normal limits  BRAIN NATRIURETIC PEPTIDE - Abnormal; Notable for the following components:   B Natriuretic Peptide 337.4 (*)    All other components within normal limits  MAGNESIUM - Abnormal; Notable for the following  components:   Magnesium 1.5 (*)    All other components within normal limits  TROPONIN I (HIGH SENSITIVITY) - Abnormal; Notable for the following components:   Troponin I (High Sensitivity) 49 (*)    All other components within normal limits    Otherwise labs showing:    Recent Labs  Lab 01/07/21 1408 01/07/21 1638  NA 131*  --   K 5.0  --   CO2 15*  --   GLUCOSE 150*  --   BUN 18  --   CREATININE 1.25*  --   CALCIUM 9.0  --   MG  --  1.5*    Cr    Up from baseline see below Lab Results  Component Value Date   CREATININE 1.25 (H) 01/07/2021   CREATININE 1.10 10/14/2020   CREATININE 1.05 10/13/2020    Recent Labs  Lab 01/07/21 1408  AST 20  ALT 17  ALKPHOS 54  BILITOT 0.4  PROT 6.5  ALBUMIN 3.4*   Lab Results  Component Value Date   CALCIUM 9.0 01/07/2021      WBC       Component Value Date/Time   WBC 9.1 01/07/2021 1408   LYMPHSABS 1.1 01/07/2021 1408   MONOABS 0.5 01/07/2021 1408   EOSABS 0.0 01/07/2021 1408   BASOSABS 0.0 01/07/2021 1408     Plt: Lab Results  Component Value Date   PLT 341 01/07/2021     ordered Venous  Blood Gas     HG/HCT  Up from baseline see below    Component Value Date/Time   HGB 11.9 (L) 01/07/2021 1408   HCT 38.3 (L) 01/07/2021 1408   MCV 79.5 (L) 01/07/2021 1408     Troponin 49 -39 Cardiac Panel (last 3 results)     ECG: Ordered Personally reviewed by me showing: HR : 94 Rhythm:  NSR, Sinus tachycardia   nonspecific changes  QTC 414   BNP (last 3 results) Recent Labs    10/13/20 1215 01/07/21 1408  BNP 178.9* 337.4*      DM  labs:  HbA1C: Recent Labs    10/14/20 0126  HGBA1C 6.1*     CBG (last 3)  No results for input(s): GLUCAP in the last 72 hours.     UA   no evidence of UTI       Urine analysis:    Component Value Date/Time   COLORURINE YELLOW 01/07/2021 2010   APPEARANCEUR CLEAR 01/07/2021 2010   LABSPEC 1.006 01/07/2021 2010  PHURINE 5.0 01/07/2021 2010   GLUCOSEU  NEGATIVE 01/07/2021 2010   HGBUR NEGATIVE 01/07/2021 2010   [place] NEGATIVE 01/07/2021 2010   [place] 01/07/2021 2010   PROTEINUR NEGATIVE 01/07/2021 2010   NITRITE NEGATIVE 01/07/2021 2010   LEUKOCYTESUR NEGATIVE 01/07/2021 2010      Ordered  CXR -  NON acute, chronic changes     ED Triage Vitals  Enc Vitals Group     BP 01/07/21 1410 128/78     Pulse Rate 01/07/21 1410 95     Resp 01/07/21 1410 (!) 24     Temp 01/07/21 1410 98.3 F (36.8 C)     Temp Source 01/07/21 1410 Axillary     SpO2 01/07/21 1404 90 %     Weight 01/07/21 1411 211 lb (95.7 kg)     Height 01/07/21 1411 6\' 6"  (1.981 m)     Head Circumference --      Peak Flow --      Pain Score 01/07/21 1411 0     Pain Loc --      Pain Edu? --      Excl. in [place]? --   TMAX(24)@       Latest  Blood pressure 130/70, pulse (!) 102, temperature 98.3 F (36.8 C), temperature source Axillary, resp. rate 19, height 6\' 6"  (1.981 m), weight 95.7 kg, SpO2 92 %.      Review of Systems:    Pertinent positives include:    Fatigue,  shortness of breath at rest.   dyspnea on exertion,  wheezing. Constitutional:  No weight loss, night sweats, Fevers, chills, weight loss  HEENT:  No headaches, Difficulty swallowing,Tooth/dental problems,Sore throat,  No sneezing, itching, ear ache, nasal congestion, post nasal drip,  Cardio-vascular:  No chest pain, Orthopnea, PND, anasarca, dizziness, palpitations.no Bilateral lower extremity swelling  GI:  No heartburn, indigestion, abdominal pain, nausea, vomiting, diarrhea, change in bowel habits, loss of appetite, melena, blood in stool, hematemesis Resp:  noNo excess mucus, no productive cough, No non-productive cough, No coughing up of blood.No change in color of mucus.No  Skin:  no rash or lesions. No jaundice GU:  no dysuria, change in color of urine, no urgency or frequency. No straining to urinate.  No flank pain.  Musculoskeletal:  No joint pain or no joint  swelling. No decreased range of motion. No back pain.  Psych:  No change in mood or affect. No depression or anxiety. No memory loss.  Neuro: no localizing neurological complaints, no tingling, no weakness, no double vision, no gait abnormality, no slurred speech, no confusion  All systems reviewed and apart from [place] all are negative  Past Medical History:   Past Medical History:  Diagnosis Date  . Abdominal aortic aneurysm without rupture ([place])   . CAD (coronary artery disease)    severe on chest CT   . Chronic alcoholism ([place])   . Chronic idiopathic constipation   . Chronic pancreatitis ([place])   . Chronic respiratory failure with hypoxia ([place])   . Essential hypertension   . GERD (gastroesophageal reflux disease)   . Hyperaldosteronism ([place])   . Malignant neoplasm of prostate ([place])   . Nicotine dependence   . Paroxysmal atrial tachycardia ([place])   . Peripheral vascular disease ([place])   . Serrated polyp of colon   . Type 2 diabetes mellitus ([place])      Past Surgical History:  Procedure Laterality Date  . PROSTATECTOMY  12/2012   Social History:  Ambulatory  walker  reports that he has quit smoking. His smoking use included cigarettes. He smoked 0.50 packs per day. He has never used smokeless tobacco. He reports previous alcohol use. He reports that he does not use drugs.   Family History:   Family History  Problem Relation Age of Onset  . Hypertension Mother   . Hypertension Father     Allergies: Allergies  Allergen Reactions  . Furosemide Injection [Furosemide] Other (See Comments)    swelling  . Hctz [Hydrochlorothiazide]      Prior to Admission medications   Medication Sig Start Date End Date Taking? Authorizing Provider  albuterol (VENTOLIN HFA) 108 (90 Base) MCG/ACT inhaler Inhale 2 puffs into the lungs every 6 (six) hours as needed for wheezing or shortness of breath.  11/24/19  Yes [provider]  amLODipine (NORVASC) 10 MG tablet Take 10 mg  by mouth daily.   Yes [provider]  aspirin 81 MG EC tablet Take 1 tablet (81 mg total) by mouth daily. 10/21/20  Yes Little Ishikawa, MD  atorvastatin (LIPITOR) 40 MG tablet Take 40 mg by mouth at bedtime.  06/11/19  Yes [provider]  enalapril (VASOTEC) 20 MG tablet Take 20 mg by mouth 2 (two) times daily.   Yes [provider]  metFORMIN (GLUCOPHAGE) 1000 MG tablet Take 1,000 mg by mouth 2 (two) times daily with a meal.    Yes [provider]  metoprolol succinate (TOPROL-XL) 100 MG 24 hr tablet Take 100 mg by mouth in the morning and at bedtime. Take with or immediately following a meal.   Yes [provider]  Omega-3 1000 MG CAPS Take 1,000 mg by mouth in the morning and at bedtime.    Yes [provider]  oxybutynin (DITROPAN-XL) 10 MG 24 hr tablet Take 10 mg by mouth daily.   Yes [provider]  cilostazol (PLETAL) 50 MG tablet Take 1 tablet (50 mg total) by mouth 2 (two) times daily. Patient not taking: Reported on 01/07/2021 10/21/20   Little Ishikawa, MD  clopidogrel (PLAVIX) 75 MG tablet Take 1 tablet (75 mg total) by mouth daily. 10/22/20   Little Ishikawa, MD  ferrous sulfate 325 (65 FE) MG tablet Take 1 tablet (325 mg total) by mouth 3 (three) times daily with meals. 10/21/20   Little Ishikawa, MD  Fluticasone-Salmeterol (ADVAIR) 250-50 MCG/DOSE AEPB Inhale 1 puff into the lungs 2 (two) times daily.    [provider]  insulin aspart protamine - aspart (NOVOLOG 70/30 MIX) (70-30) 100 UNIT/ML FlexPen Inject 45 Units into the skin 2 (two) times daily with a meal.  06/11/19   [provider]  KETOROLAC TROMETHAMINE, PF, OP Apply 1 drop to eye in the morning, at noon, in the evening, and at bedtime.    [provider]  metoprolol succinate (TOPROL-XL) 50 MG 24 hr tablet Take 3 tablets (150 mg total) by mouth 2 (two) times daily in the am and at bedtime.. Take with or immediately  following a meal. 10/21/20 11/20/20  Little Ishikawa, MD  nortriptyline (PAMELOR) 50 MG capsule Take 50 mg by mouth at bedtime.     [provider]  omeprazole (PRILOSEC) 20 MG capsule Take 20 mg by mouth daily.     [provider]  Tiotropium Bromide Monohydrate 2.5 MCG/ACT AERS Inhale 2 puffs into the lungs daily.    [provider]   Physical Exam: Vitals with BMI 01/07/2021 01/07/2021 01/07/2021  Height - - -  Weight - - -  BMI - - -  Systolic [id number]  Diastolic 70 77 70  Pulse [id number] 103   1. General:  in No   Acute distress   Chronically ill  -appearing 2. Psychological: Alert and Oriented 3. Head/ENT:   Dry Mucous Membranes                          Head Non traumatic, neck supple                           Poor Dentition 4. SKIN:  decreased Skin turgor,  Skin clean Dry and intact no rash 5. Heart: Regular rate and rhythm no  Murmur, no Rub or gallop 6. Lungs: mild wheezes or crackles   7. Abdomen: Soft, non-tender,   distended  bowel sounds present 8. Lower extremities: no clubbing, cyanosis, no  edema 9. Neurologically Grossly intact, moving all 4 extremities equally   10. MSK: Normal range of motion  All other LABS:    Recent Labs  Lab 01/07/21 1408  WBC 9.1  NEUTROABS 7.4  HGB 11.9*  HCT 38.3*  MCV 79.5*  PLT 341     Recent Labs  Lab 01/07/21 1408 01/07/21 1638  NA 131*  --   K 5.0  --   CL 103  --   CO2 15*  --   GLUCOSE 150*  --   BUN 18  --   CREATININE 1.25*  --   CALCIUM 9.0  --   MG  --  1.5*     Recent Labs  Lab 01/07/21 1408  AST 20  ALT 17  ALKPHOS 54  BILITOT 0.4  PROT 6.5  ALBUMIN 3.4*      Cultures: No results found for: SDES, SPECREQUEST, CULT, REPTSTATUS   Radiological Exams on Admission: DG Chest Port 1 View  Result Date: 01/07/2021 CLINICAL DATA:  Shortness of breath for 2 days. EXAM: PORTABLE CHEST 1 VIEW COMPARISON:  October 13, 2020 FINDINGS: Increased bilateral interstitial lung markings in  the right mid lower lung in the periphery of the left mid lower lung. These findings are not significantly changed since October 13, 2020. A CT scan at that time demonstrated emphysematous changes without infiltrate. No pneumothorax. No nodules or masses. Stable cardiomegaly. The hila and mediastinum are unchanged. IMPRESSION: No definite interval change in the appearance of the chest. Chronic increased interstitial lung markings remain, particularly in the right mid and lower lung, noted to represent emphysematous changes on previous CT imaging. A subtle acute on chronic process may be difficult to identify in the background of these chronic changes. Electronically Signed   By: Dorise Bullion III M.D   On: 01/07/2021 15:58    Chart has been reviewed    Assessment/Plan  75 y.o. 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 COPD with acute exacerbation ([place]) -   Will initiate: Steroid taper  -  Antibiotics Rocephin - Albuterol PRN, - scheduled duoneb,  -  Breo or Dulera at discharge   -  Mucinex.  Titrate O2 to saturation >90%. Follow patients respiratory status.   VBG ordered  Currently mentating well no evidence of symptomatic hypercarbia  . Elevated troponin -  no  chest pain no EKG changes in the setting of  increased work of breathing and  chronic kidney disease likely due to demand ischemia and poor clearance, monitor on telemetry and cycle cardiac enzymes to trend.  if continues to rise will need further work-up  . Tobacco abuse - in remission  . Hyponatremia - chronic, obtain urine electrolytes  . GERD (gastroesophageal reflux disease) - continue PPI  . Essential hypertension - continue home meds, change Toprol to Zebeta given COPD exacerbation  . Acute on chronic respiratory  failure with hypoxemia (HCC) -  this patient has acute on chronic respiratory failure with Hypoxia  Needing 10 L of O2 up from baseline of 4L Likely due to: COPD exacerbation,   Provide O2 therapy and titrate as needed  Continuous pulse ox   check Pulse ox with ambulation prior to discharge    . CAD (coronary artery disease) -  - chronic, continue aspirin  and statin  and beta blocker    Hx of CVA - continue aspirin  and statin  And Plavix   . Hypomagnesemia - will replace  AKI - in the setting of decreased PO intake gently rehydrate, obtain urine lytes,  Pt producing urine  Other plan as per orders.  DVT prophylaxis:  SCD    Code Status:    Code Status: Prior FULL CODE  as per patient   I had personally discussed CODE STATUS with patient    Family Communication:   Family not at  Bedside    Disposition Plan:                               To home once workup is complete and patient is stable     Following barriers for discharge:                            Electrolytes corrected                                                           Will likely need home health                                          Would benefit from PT/OT eval prior to DC  Ordered                                     Transition of care consulted                   Nutrition    consulted                                       Consults called: none  Admission status:  ED Disposition    ED Disposition Condition Comment   Admit  Hospital Area: [facility] [[id number]]  Level of Care: Progressive [102]  Admit to Progressive based on following criteria: RESPIRATORY PROBLEMS hypoxemic/hypercapnic respiratory  failure that is responsive to NIPPV (BiPAP) or High Flow Nasal Cannula (6-80 lpm). Frequent assessment/intervention, no > Q2 hrs < Q4 hrs, to maintain oxygenation and pulmonary hygiene.  May [facility] to [facility] or [facility] if equivalent level of care is available:: No  Covid  Evaluation: Confirmed COVID Negative  Diagnosis: COPD exacerbation [facility]) [[id number]]  Admitting Physician: Toy Baker [[id number]]  Attending Physician: Toy Baker [[id number]]  Estimated length of stay: past midnight tomorrow  Certification:: I certify this patient will need inpatient services for at least 2 midnights       inpatient     I Expect 2 midnight stay secondary to severity of patient's current illness need for inpatient interventions justified by the following:  hemodynamic instability despite optimal treatment (tachycardia hypoxia, hypercapnia)   Severe lab/radiological/exam abnormalities including:    COPD exacerbation and extensive comorbidities including:    DM2      CAD   COPD/asthma    CKD  history of stroke with residual deficits   malignancy, .    That are currently affecting medical management.   I expect  patient to be hospitalized for 2 midnights requiring inpatient medical care.  Patient is at high risk for adverse outcome (such as loss of life or disability) if not treated.  Indication for inpatient stay as follows:    Hemodynamic instability despite maximal medical therapy,      worsening hypoxia  Need for IV antibiotics, IV fluids,      Level of care     SDU tele indefinitely please discontinue once patient no longer qualifies COVID-19 Labs   Lab Results  Component Value Date   [place] NEGATIVE 01/07/2021   Precautions: admitted as Covid Negative   PPE: Used by the provider:   P100  eye Goggles,  Gloves     Jaylanni Eltringham 01/07/2021, 9:42 PM    [facility]     after 2 AM please page floor coverage PA If 7AM-7PM, please contact the day team taking care of the patient using [email or web address]   Patient was evaluated in the context of the global COVID-19 pandemic, which necessitated consideration that the patient might be at risk for infection with the SARS-CoV-2 virus that causes COVID-19. Institutional protocols and  algorithms that pertain to the evaluation of patients at risk for COVID-19 are in a state of rapid change based on information released by regulatory bodies including the CDC and federal and state organizations. These policies and algorithms were followed during the patient's care.

## 2021-01-08 ENCOUNTER — Other Ambulatory Visit: Payer: Self-pay

## 2021-01-08 ENCOUNTER — Encounter (HOSPITAL_COMMUNITY): Payer: Self-pay | Admitting: Internal Medicine

## 2021-01-08 DIAGNOSIS — R778 Other specified abnormalities of plasma proteins: Secondary | ICD-10-CM | POA: Diagnosis not present

## 2021-01-08 DIAGNOSIS — I2510 Atherosclerotic heart disease of native coronary artery without angina pectoris: Secondary | ICD-10-CM | POA: Diagnosis not present

## 2021-01-08 DIAGNOSIS — J441 Chronic obstructive pulmonary disease with (acute) exacerbation: Principal | ICD-10-CM

## 2021-01-08 DIAGNOSIS — J9621 Acute and chronic respiratory failure with hypoxia: Secondary | ICD-10-CM | POA: Diagnosis not present

## 2021-01-08 DIAGNOSIS — R7989 Other specified abnormal findings of blood chemistry: Secondary | ICD-10-CM | POA: Diagnosis not present

## 2021-01-08 DIAGNOSIS — I10 Essential (primary) hypertension: Secondary | ICD-10-CM | POA: Diagnosis not present

## 2021-01-08 DIAGNOSIS — E1159 Type 2 diabetes mellitus with other circulatory complications: Secondary | ICD-10-CM

## 2021-01-08 LAB — CBC WITH DIFFERENTIAL/PLATELET
Abs Immature Granulocytes: 0.02 10*3/uL (ref 0.00–0.07)
Abs Immature Granulocytes: 0.02 10*3/uL (ref 0.00–0.07)
Basophils Absolute: 0 10*3/uL (ref 0.0–0.1)
Basophils Absolute: 0 10*3/uL (ref 0.0–0.1)
Basophils Relative: 0 %
Basophils Relative: 0 %
Eosinophils Absolute: 0 10*3/uL (ref 0.0–0.5)
Eosinophils Absolute: 0 10*3/uL (ref 0.0–0.5)
Eosinophils Relative: 0 %
Eosinophils Relative: 0 %
HCT: 34 % — ABNORMAL LOW (ref 39.0–52.0)
HCT: 37.6 % — ABNORMAL LOW (ref 39.0–52.0)
Hemoglobin: 10.7 g/dL — ABNORMAL LOW (ref 13.0–17.0)
Hemoglobin: 10.9 g/dL — ABNORMAL LOW (ref 13.0–17.0)
Immature Granulocytes: 0 %
Immature Granulocytes: 0 %
Lymphocytes Relative: 12 %
Lymphocytes Relative: 8 %
Lymphs Abs: 0.6 10*3/uL — ABNORMAL LOW (ref 0.7–4.0)
Lymphs Abs: 0.5 10*3/uL — ABNORMAL LOW (ref 0.7–4.0)
MCH: 24.6 pg — ABNORMAL LOW (ref 26.0–34.0)
MCH: 23.4 pg — ABNORMAL LOW (ref 26.0–34.0)
MCHC: 31.5 g/dL (ref 30.0–36.0)
MCHC: 29 g/dL — ABNORMAL LOW (ref 30.0–36.0)
MCV: 78.2 fL — ABNORMAL LOW (ref 80.0–100.0)
MCV: 80.9 fL (ref 80.0–100.0)
Monocytes Absolute: 0.2 10*3/uL (ref 0.1–1.0)
Monocytes Absolute: 0.3 10*3/uL (ref 0.1–1.0)
Monocytes Relative: 3 %
Monocytes Relative: 4 %
Neutro Abs: 4.4 10*3/uL (ref 1.7–7.7)
Neutro Abs: 4.9 10*3/uL (ref 1.7–7.7)
Neutrophils Relative %: 85 %
Neutrophils Relative %: 88 %
Platelets: 342 10*3/uL (ref 150–400)
Platelets: 328 10*3/uL (ref 150–400)
RBC: 4.35 MIL/uL (ref 4.22–5.81)
RBC: 4.65 MIL/uL (ref 4.22–5.81)
RDW: 21.6 % — ABNORMAL HIGH (ref 11.5–15.5)
RDW: 21.9 % — ABNORMAL HIGH (ref 11.5–15.5)
WBC: 5.1 10*3/uL (ref 4.0–10.5)
WBC: 5.7 10*3/uL (ref 4.0–10.5)
nRBC: 0 % (ref 0.0–0.2)
nRBC: 0 % (ref 0.0–0.2)

## 2021-01-08 LAB — TROPONIN I (HIGH SENSITIVITY)
Troponin I (High Sensitivity): 33 ng/L — ABNORMAL HIGH (ref <18)
Troponin I (High Sensitivity): 343 ng/L (ref <18)
Troponin I (High Sensitivity): 1258 ng/L (ref <18)

## 2021-01-08 LAB — COMPREHENSIVE METABOLIC PANEL
ALT: 16 U/L (ref 0–44)
AST: 14 U/L — ABNORMAL LOW (ref 15–41)
Albumin: 2.9 g/dL — ABNORMAL LOW (ref 3.5–5.0)
Alkaline Phosphatase: 47 U/L (ref 38–126)
Anion gap: 10 (ref 5–15)
BUN: 15 mg/dL (ref 8–23)
CO2: 18 mmol/L — ABNORMAL LOW (ref 22–32)
Calcium: 8.5 mg/dL — ABNORMAL LOW (ref 8.9–10.3)
Chloride: 106 mmol/L (ref 98–111)
Creatinine, Ser: 1.1 mg/dL (ref 0.61–1.24)
GFR, Estimated: >60 mL/min (ref >60)
Glucose, Bld: 287 mg/dL — ABNORMAL HIGH (ref 70–99)
Potassium: 4.9 mmol/L (ref 3.5–5.1)
Sodium: 134 mmol/L — ABNORMAL LOW (ref 135–145)
Total Bilirubin: 0.7 mg/dL (ref 0.3–1.2)
Total Protein: 5.7 g/dL — ABNORMAL LOW (ref 6.5–8.1)

## 2021-01-08 LAB — BLOOD GAS, ARTERIAL
Acid-base deficit: 2.9 mmol/L — ABNORMAL HIGH (ref 0.0–2.0)
Bicarbonate: 20.3 mmol/L (ref 20.0–28.0)
Drawn by: 24686
FIO2: 40
O2 Saturation: 80.8 %
Patient temperature: 36.8
pCO2 arterial: 28.5 mmHg — ABNORMAL LOW (ref 32.0–48.0)
pH, Arterial: 7.465 — ABNORMAL HIGH (ref 7.350–7.450)
pO2, Arterial: 46.3 mmHg — ABNORMAL LOW (ref 83.0–108.0)

## 2021-01-08 LAB — GLUCOSE, CAPILLARY
Glucose-Capillary: 266 mg/dL — ABNORMAL HIGH (ref 70–99)
Glucose-Capillary: 258 mg/dL — ABNORMAL HIGH (ref 70–99)

## 2021-01-08 LAB — CBG MONITORING, ED
Glucose-Capillary: 257 mg/dL — ABNORMAL HIGH (ref 70–99)
Glucose-Capillary: 309 mg/dL — ABNORMAL HIGH (ref 70–99)

## 2021-01-08 LAB — PHOSPHORUS: Phosphorus: 4.5 mg/dL (ref 2.5–4.6)

## 2021-01-08 LAB — TSH: TSH: 0.533 u[IU]/mL (ref 0.350–4.500)

## 2021-01-08 LAB — MAGNESIUM: Magnesium: 1.6 mg/dL — ABNORMAL LOW (ref 1.7–2.4)

## 2021-01-08 LAB — D-DIMER, QUANTITATIVE: D-Dimer, Quant: 0.89 ug/mL-FEU — ABNORMAL HIGH (ref 0.00–0.50)

## 2021-01-08 MED ORDER — MAGNESIUM SULFATE 2 GM/50ML IV SOLN
2.0000 g | Freq: Once | INTRAVENOUS | Status: AC
  Administered 2021-01-08: 2 g via INTRAVENOUS
  Filled 2021-01-08: qty 50

## 2021-01-08 MED ORDER — DILTIAZEM HCL 60 MG PO TABS
30.0000 mg | ORAL_TABLET | Freq: Four times a day (QID) | ORAL | Status: DC
  Administered 2021-01-08 – 2021-01-10 (×7): 30 mg via ORAL
  Filled 2021-01-08 (×7): qty 1

## 2021-01-08 MED ORDER — IPRATROPIUM-ALBUTEROL 0.5-2.5 (3) MG/3ML IN SOLN
3.0000 mL | Freq: Three times a day (TID) | RESPIRATORY_TRACT | Status: DC
  Administered 2021-01-09 – 2021-01-11 (×6): 3 mL via RESPIRATORY_TRACT
  Filled 2021-01-08 (×7): qty 3

## 2021-01-08 MED ORDER — ENOXAPARIN SODIUM 40 MG/0.4ML ~~LOC~~ SOLN
40.0000 mg | SUBCUTANEOUS | Status: DC
  Administered 2021-01-08 – 2021-01-12 (×4): 40 mg via SUBCUTANEOUS
  Filled 2021-01-08 (×4): qty 0.4

## 2021-01-08 NOTE — ED Notes (Signed)
Dr Horris Latino secured messaged   Pt SPO2 in the 80's, labored. May need Bipap Could you please come evaluate pt

## 2021-01-08 NOTE — Consult Note (Signed)
Cardiology Consultation:   Patient ID: Brandon [name] MRN: [id number]; DOB: 25-Jul-1946  Admit date: 01/07/2021 Date of Consult: 01/08/2021  Primary Care Provider: Pcp, No [facility] Cardiologist:NEW   Follows at [place]   Patient Profile:   Brandon [name] is a 75 y.o. male with a hx of CAD  who is being seen today for the evaluation of SOB   at the request of Ezemdila  History of Present Illness:   Brandon [name] is a 75 yo who has a hx of CAD   Followed at [place] system.   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  Pt notedwith walking he gets winded.     Denies CP     The pt is not the best historian.   He is followed at [facility] cares for him, he says she will be able to fill in details   [phone] is number to facility)   He thinks he has had a stress test in the past   Was due to have one again he thinks, not sure why   Doesn't think he has has a catheterization in past    He denies CP  Says a few wks ago his breathing was OK   "stable"    Over the past few days has had increased SOB with activity   Says his O2 machine was broken     Came to [facility] ED   Pt sleeps in a reclnier chronically   Says his ankles were swollen in past but not now.   IN ED on arrival required 10 L O2   Initial troponin 39 second 33  Third  343    CT of chest in October 2021:  Ascending aorta 48 mm     Past Medical History:  Diagnosis Date  . Abdominal aortic aneurysm without rupture ([place])   . CAD (coronary artery disease)    severe on chest CT   . Chronic alcoholism ([place])   . Chronic idiopathic constipation   . Chronic pancreatitis ([place])   . Chronic respiratory failure with hypoxia ([place])   . Essential hypertension   . GERD (gastroesophageal reflux disease)   . Hyperaldosteronism ([place])   . Malignant neoplasm of prostate ([place])   . Nicotine dependence    . Paroxysmal atrial tachycardia ([place])   . Peripheral vascular disease ([place])   . Serrated polyp of colon   . Type 2 diabetes mellitus ([place])     Past Surgical History:  Procedure Laterality Date  . PROSTATECTOMY  12/2012       Inpatient Medications: Scheduled Meds: . amLODipine  10 mg Oral Daily  . aspirin  81 mg Oral Daily  . atorvastatin  40 mg Oral QHS  . bisoprolol  5 mg Oral Daily  . clopidogrel  75 mg Oral Daily  . enoxaparin (LOVENOX) injection  40 mg Subcutaneous Q24H  . insulin aspart  0-5 Units Subcutaneous QHS  . insulin aspart  0-9 Units Subcutaneous TID WC  . insulin glargine  30 Units Subcutaneous QHS  . ipratropium-albuterol  3 mL Nebulization Q6H  . methylPREDNISolone (SOLU-MEDROL) injection  60 mg Intravenous Q6H   Followed by  . [START ON 01/09/2021] predniSONE  40 mg Oral Q breakfast  . mometasone-formoterol  2 puff Inhalation BID  . nortriptyline  50 mg Oral QHS  .  oxybutynin  10 mg Oral Daily  . pantoprazole  40 mg Oral Daily   Continuous Infusions: . cefTRIAXone (ROCEPHIN)  IV Stopped (01/08/21 0035)   PRN Meds: acetaminophen **OR** acetaminophen, albuterol, bisacodyl, HYDROcodone-acetaminophen, senna-docusate  Allergies:    Allergies  Allergen Reactions  . Furosemide Injection [Furosemide] Other (See Comments)    swelling  . Hctz [Hydrochlorothiazide]     Social History:   Social History   Socioeconomic History  . Marital status: Divorced    Spouse name: Not on file  . Number of children: Not on file  . Years of education: Not on file  . Highest education level: Not on file  Occupational History  . Not on file  Tobacco Use  . Smoking status: Former Smoker    Packs/day: 0.50    Types: Cigarettes  . Smokeless tobacco: Never Used  Substance and Sexual Activity  . Alcohol use: Not Currently  . Drug use: Never  . Sexual activity: Not on file  Other Topics Concern  . Not on file  Social History Narrative  . Not on file   Social  Determinants of Health   Financial Resource Strain: Not on file  Food Insecurity: Not on file  Transportation Needs: Not on file  Physical Activity: Not on file  Stress: Not on file  Social Connections: Not on file  Intimate Partner Violence: Not on file    Family History:    Family History  Problem Relation Age of Onset  . Hypertension Mother   . Hypertension Father      ROS:  Please see the history of present illness.  All other ROS reviewed and negative.     Physical Exam/Data:   Vitals:   01/08/21 1230 01/08/21 1400 01/08/21 1500 01/08/21 1512  BP: 122/75 125/74 127/74   Pulse: (!) 109 (!) 106 (!) 101   Resp: (!) 25 (!) 23 16   Temp:    97.8 F (36.6 C)  TempSrc:    Oral  SpO2: 93% 94% 100%   Weight:      Height:       No intake or output data in the 24 hours ending 01/08/21 1604 Last 3 Weights 01/07/2021 10/13/2020  Weight (lbs) 211 lb 186 lb 1.1 oz  Weight (kg) 95.709 kg 84.4 kg     Body mass index is 24.38 kg/m.  General:  Pt is an ill appearing 75 yo in NAD   HEENT: normal Lymph: no adenopathy Neck: external JVP increased  Endocrine:  No thryomegaly Vascular: No carotid bruits; FA pulses 2+ bilaterally without bruits  Cardiac:  RRR  Tachy   S1/ S2  No S3  No murmurs    Lungs:  Moving air   Relatively clear  Abd: soft, nontender, no hepatomegaly  Ext: no edema  Chronic changes skin (discoloration, wrinkles, erythemaZ) Musculoskeletal:  No deformities, BUE and BLE strength normal and equal Skin: warm and dry    As noted above on extrem   Neuro:  CNs 2-12 intact, no focal abnormalities noted Psych:  Normal affect   EKG:  The EKG was personally reviewed and demonstrates:  01/07/21:  SR  Nonspecific ST changes   Telemetry:  Telemetry was personally reviewed and demonstrates:  SR/ST   Many burst of PAT  Self limited   140s during bursts   Otherwise 100s    Relevant CV Studies: Echo  09/2020  1. Left ventricular ejection fraction, by estimation, is 60 to  65%. The left ventricle has normal  function. The left ventricle has no regional wall motion abnormalities. There is moderate left ventricular hypertrophy. Indeterminate diastolic filling due to E-A fusion. 2. Right ventricular systolic function is normal. The right ventricular size is mildly enlarged. Tricuspid regurgitation signal is inadequate for assessing PA pressure. 3. The mitral valve is degenerative. Mild mitral valve regurgitation. No evidence of mitral stenosis. 4. Prominent LVOT calcifications extending along base of anterior mitral leaflet.. The aortic valve is abnormal. There is moderate calcification of the aortic valve. Aortic valve regurgitation is mild to moderate. No aortic stenosis is present. 5. Aortic dilatation noted. There is mild dilatation of the aortic root, measuring 40 mm. There is moderate dilatation of the ascending aorta, measuring 48 mm. 6. The inferior vena cava is dilated in size with >50% respiratory variability, suggesting right atrial pressure of 8 mmHg. 7. Cannot exclude small PFO by color flow. Atrial septal aneurysm.  Laboratory Data:  High Sensitivity Troponin:   Recent Labs  Lab 01/07/21 1638 01/07/21 2005 01/07/21 2300 01/08/21 1227  TROPONINIHS 49* 39* 33* 343*     Chemistry Recent Labs  Lab 01/07/21 1408 01/07/21 2336 01/08/21 0339  NA 131* 134* 134*  K 5.0 4.8 4.9  CL 103  --  106  CO2 15*  --  18*  GLUCOSE 150*  --  287*  BUN 18  --  15  CREATININE 1.25*  --  1.10  CALCIUM 9.0  --  8.5*  GFRNONAA >60  --  >60  ANIONGAP 13  --  10    Recent Labs  Lab 01/07/21 1408 01/08/21 0339  PROT 6.5 5.7*  ALBUMIN 3.4* 2.9*  AST 20 14*  ALT 17 16  ALKPHOS 54 47  BILITOT 0.4 0.7   Hematology Recent Labs  Lab 01/07/21 1408 01/07/21 2336 01/08/21 0339 01/08/21 0755  WBC 9.1  --  5.1 5.7  RBC 4.82  --  4.35 4.65  HGB 11.9* 12.9* 10.7* 10.9*  HCT 38.3* 38.0* 34.0* 37.6*  MCV 79.5*  --  78.2* 80.9  MCH 24.7*  --  24.6*  23.4*  MCHC 31.1  --  31.5 29.0*  RDW 21.9*  --  21.6* 21.9*  PLT 341  --  342 328   BNP Recent Labs  Lab 01/07/21 1408  BNP 337.4*    DDimer No results for input(s): DDIMER in the last 168 hours.   Radiology/Studies:  DG Chest Port 1 View  Result Date: 01/07/2021 CLINICAL DATA:  Shortness of breath for 2 days. EXAM: PORTABLE CHEST 1 VIEW COMPARISON:  October 13, 2020 FINDINGS: Increased bilateral interstitial lung markings in the right mid lower lung in the periphery of the left mid lower lung. These findings are not significantly changed since October 13, 2020. A CT scan at that time demonstrated emphysematous changes without infiltrate. No pneumothorax. No nodules or masses. Stable cardiomegaly. The hila and mediastinum are unchanged. IMPRESSION: No definite interval change in the appearance of the chest. Chronic increased interstitial lung markings remain, particularly in the right mid and lower lung, noted to represent emphysematous changes on previous CT imaging. A subtle acute on chronic process may be difficult to identify in the background of these chronic changes. Electronically Signed   By: Dorise Bullion III M.D   On: 01/07/2021 15:58     Assessment and Plan:   1  Elevated troponin . Pt with hx of severe COPD and CAD as seen on chest CT   No Hx of MI or CP in past.  No catheterization in past .  Presents with increased dyspnea but not CP  Required increased O2 on arrival to ED, continues to desat at times Troponin bumped to over 300  Review of tele he has had sinus tachycardia frequent bursts of PAT  I am not convinced of active angina.  Troponin could represent demand insetting of hypoxia and tachycardia (in pt with CAD)      On exam, question if he has some increased volume (again, related to tachycardia with possible diastolc dysfunction)  Recomm:   Optimize pulmonary status   Will try to slow  Heart rate down, decrease O2 demand Pt reported allergic to lasix   Need to  confirm   Could benefit with one dose diuretic. Echo in October (when admittedd with CVA) showed normal LVEF    Would do limited  when HR improves to reassess LVEF , r/o wall motion changes  Would be important to contact [facility] tomorrow to see what they have done in past   2 Rhythm.  Pt with bursts of atrial tach that are self limited   Continue tele.  No frank atrial fibrillation    Would try diltiazem  30 q 6.  Follow BP and titrate as tolerates  3  HTN   Follow BP   I have stopped amlodipine   Would try increasing dilt for HR control  And BP control  4 Hx CVA  Pt on Plavix and ASA  Continue tele  5  Hx thoracic aneurysm.   CT shows 48 mm asc aneurysm.  ? [facility]   6  COPD   [place] 3L O2  Higher now  IM treating   7  Lipids   Pt takes lipitor  8  Hx Anemia   Follow while here  9 DM   Per IM      For questions or updates, please contact [facility] Please consult [email or web address] for contact info under    Signed, Dorris Carnes, MD  01/08/2021 4:04 PM

## 2021-01-08 NOTE — Progress Notes (Signed)
PROGRESS NOTE  Brandon [name] [id number] DOB: 30-Sep-1946 DOA: 01/07/2021 PCP: Pcp, No  HPI/Recap of past 24 hours: HPI from Dr Seward Carol is a 75 y.o. 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 Not vaccinated for COVID he would like to but had trouble with transportation. Denies any chest pain, reports chronic abd distention since his prostate surgery as per pt. in the ED, patient was noted to be requiring about 10 L of oxygen on presentation, chest x-ray showed emphysematous changes, Covid and flu negative.  Patient admitted for further management.    Today, patient still short of breath, but some intermittent cough, denies any chest pain, abdominal pain, nausea/vomiting, fever/chills.   Assessment/Plan: Active Problems:   Essential hypertension   Type 2 diabetes mellitus (HCC)   CAD (coronary artery disease)   GERD (gastroesophageal reflux disease)   Acute on chronic respiratory failure with hypoxemia (HCC)   Tobacco abuse   Hyponatremia   Elevated troponin   COPD with acute exacerbation (HCC)   History of CVA (cerebrovascular accident)   COPD exacerbation (HCC)   Hypomagnesemia   Acute on chronic hypoxic respiratory failure Possibly COPD with acute exacerbation On 4 L of O2 at baseline, requiring about 10L on presentation Currently afebrile, with no leukocytosis Chest x-ray with evidence of mashes changes D-dimer pending ECHO pending Covid, flu panel negative Continue duo nebs, inhalers, steroids, Mucinex Continue ceftriaxone for total of 5 days Continue supplemental oxygen, BiPAP as needed Monitor closely  Elevated troponin Flat trend Likely from demand ischemia Continue telemetry  Diabetes  mellitus type 2 Last A1c 6.4 on 01/07/2021, well controlled SSI, Lantus, Accu-Cheks, hypoglycemic protocol  Hypomagnesemia Replace as needed  Microcytic anemia Hemoglobin fluctuating, somewhat around baseline Anemia panel pending Daily CBC  Hypertension BP stable Continue amlodipine  History of CAD/CVA Currently chest pain-free Continue aspirin, statins, Plavix, switched to bisoprolol due to COPD exacerbation  GERD Continue pantoprazole      Estimated body mass index is 24.38 kg/m as calculated from the following:   Height as of this encounter: 6\' 6"  (1.981 m).   Weight as of this encounter: 95.7 kg.     Code Status: Full  Family Communication: None at bedside  Disposition Plan: Status is: Inpatient  Remains inpatient appropriate because:Inpatient level of care appropriate due to severity of illness   Dispo: The patient is from: Home              Anticipated d/c is to: Home              Anticipated d/c date is: 2 days              Patient currently is not medically stable to d/c.    Consultants:  None  Procedures:  None  Antimicrobials:  Ceftriaxone  DVT prophylaxis: Lovenox   Objective: Vitals:   01/08/21 0600 01/08/21 0807 01/08/21 0830 01/08/21 0900  BP: 127/69  134/78 128/79  Pulse: 99  (!) 104 (!) 102  Resp: 15  (!) 24 20  Temp:  (!) 97.5 F (36.4 C)    TempSrc:  Oral    SpO2: 92%  (!) 88% 96%  Weight:      Height:  No intake or output data in the 24 hours ending 01/08/21 1014 Filed Weights   01/07/21 1411  Weight: 95.7 kg    Exam:  General: NAD, deconditioned, chronically ill-appearing  Cardiovascular: S1, S2 present  Respiratory:  Diminished breath sounds bilaterally  Abdomen: Soft, nontender, distended, bowel sounds present  Musculoskeletal: Trace bilateral pedal edema noted, with chronic venous stasis as well as dryness/flaking noted on BLE  Skin:  As noted above  Psychiatry: Normal mood   Data  Reviewed: CBC: Recent Labs  Lab 01/07/21 1408 01/07/21 2336 01/08/21 0339 01/08/21 0755  WBC 9.1  --  5.1 5.7  NEUTROABS 7.4  --  4.4 4.9  HGB 11.9* 12.9* 10.7* 10.9*  HCT 38.3* 38.0* 34.0* 37.6*  MCV 79.5*  --  78.2* 80.9  PLT 341  --  342 [id number]   Basic Metabolic Panel: Recent Labs  Lab 01/07/21 1408 01/07/21 1638 01/07/21 2336  NA 131*  --  134*  K 5.0  --  4.8  CL 103  --   --   CO2 15*  --   --   GLUCOSE 150*  --   --   BUN 18  --   --   CREATININE 1.25*  --   --   CALCIUM 9.0  --   --   MG  --  1.5*  --    GFR: Estimated Creatinine Clearance: 66 mL/min (A) (by C-G formula based on SCr of 1.25 mg/dL (H)). Liver Function Tests: Recent Labs  Lab 01/07/21 1408  AST 20  ALT 17  ALKPHOS 54  BILITOT 0.4  PROT 6.5  ALBUMIN 3.4*   No results for input(s): LIPASE, AMYLASE in the last 168 hours. No results for input(s): AMMONIA in the last 168 hours. Coagulation Profile: No results for input(s): INR, PROTIME in the last 168 hours. Cardiac Enzymes: No results for input(s): CKTOTAL, CKMB, CKMBINDEX, TROPONINI in the last 168 hours. BNP (last 3 results) No results for input(s): PROBNP in the last 8760 hours. HbA1C: Recent Labs    01/07/21 2006  HGBA1C 6.4*   CBG: Recent Labs  Lab 01/07/21 2124 01/08/21 0908  GLUCAP 175* 257*   Lipid Profile: No results for input(s): CHOL, HDL, LDLCALC, TRIG, CHOLHDL, LDLDIRECT in the last 72 hours. Thyroid Function Tests: Recent Labs    01/08/21 0339  TSH 0.533   Anemia Panel: No results for input(s): VITAMINB12, FOLATE, FERRITIN, TIBC, IRON, RETICCTPCT in the last 72 hours. Urine analysis:    Component Value Date/Time   COLORURINE YELLOW 01/07/2021 2010   APPEARANCEUR CLEAR 01/07/2021 2010   LABSPEC 1.006 01/07/2021 2010   PHURINE 5.0 01/07/2021 2010   GLUCOSEU NEGATIVE 01/07/2021 2010   HGBUR NEGATIVE 01/07/2021 2010   [place] NEGATIVE 01/07/2021 2010   [place] NEGATIVE 01/07/2021 2010   PROTEINUR  NEGATIVE 01/07/2021 2010   NITRITE NEGATIVE 01/07/2021 2010   LEUKOCYTESUR NEGATIVE 01/07/2021 2010   Sepsis Labs: @LABRCNTIP (procalcitonin:4,lacticidven:4)  ) Recent Results (from the past 240 hour(s))  Resp Panel by RT-PCR (Flu A&B, Covid) Nasopharyngeal Swab     Status: None   Collection Time: 01/07/21  3:41 PM   Specimen: Nasopharyngeal Swab; Nasopharyngeal(NP) swabs in vial transport medium  Result Value Ref Range Status   SARS Coronavirus 2 by RT PCR NEGATIVE NEGATIVE Final    Comment: (NOTE) SARS-CoV-2 target nucleic acids are NOT DETECTED.  The SARS-CoV-2 RNA is generally detectable in upper respiratory specimens during the acute phase of infection. The lowest concentration of SARS-CoV-2 viral copies this  assay can detect is 138 copies/mL. A negative result does not preclude SARS-Cov-2 infection and should not be used as the sole basis for treatment or other patient management decisions. A negative result may occur with  improper specimen collection/handling, submission of specimen other than nasopharyngeal swab, presence of viral mutation(s) within the areas targeted by this assay, and inadequate number of viral copies(<138 copies/mL). A negative result must be combined with clinical observations, patient history, and epidemiological information. The expected result is Negative.  Fact Sheet for Patients:  EntrepreneurPulse.com.au  Fact Sheet for Healthcare Providers:  IncredibleEmployment.be  This test is no t yet approved or cleared by the [place] FDA and  has been authorized for detection and/or diagnosis of SARS-CoV-2 by FDA under an Emergency Use Authorization (EUA). This EUA will remain  in effect (meaning this test can be used) for the duration of the COVID-19 declaration under Section 564(b)(1) of the Act, 21 U.S.C.section 360bbb-3(b)(1), unless the authorization is terminated  or revoked sooner.       Influenza A  by PCR NEGATIVE NEGATIVE Final   Influenza B by PCR NEGATIVE NEGATIVE Final    Comment: (NOTE) The Xpert Xpress SARS-CoV-2/FLU/RSV plus assay is intended as an aid in the diagnosis of influenza from Nasopharyngeal swab specimens and should not be used as a sole basis for treatment. Nasal washings and aspirates are unacceptable for Xpert Xpress SARS-CoV-2/FLU/RSV testing.  Fact Sheet for Patients: EntrepreneurPulse.com.au  Fact Sheet for Healthcare Providers: IncredibleEmployment.be  This test is not yet approved or cleared by the [place] FDA and has been authorized for detection and/or diagnosis of SARS-CoV-2 by FDA under an Emergency Use Authorization (EUA). This EUA will remain in effect (meaning this test can be used) for the duration of the COVID-19 declaration under Section 564(b)(1) of the Act, 21 U.S.C. section 360bbb-3(b)(1), unless the authorization is terminated or revoked.  Performed at [facility], [place] [address] [place], [place] [address]       Studies: DG Chest Port 1 View  Result Date: 01/07/2021 CLINICAL DATA:  Shortness of breath for 2 days. EXAM: PORTABLE CHEST 1 VIEW COMPARISON:  October 13, 2020 FINDINGS: Increased bilateral interstitial lung markings in the right mid lower lung in the periphery of the left mid lower lung. These findings are not significantly changed since October 13, 2020. A CT scan at that time demonstrated emphysematous changes without infiltrate. No pneumothorax. No nodules or masses. Stable cardiomegaly. The hila and mediastinum are unchanged. IMPRESSION: No definite interval change in the appearance of the chest. Chronic increased interstitial lung markings remain, particularly in the right mid and lower lung, noted to represent emphysematous changes on previous CT imaging. A subtle acute on chronic process may be difficult to identify in the background of these chronic changes. Electronically  Signed   By: Dorise Bullion III M.D   On: 01/07/2021 15:58    Scheduled Meds: . amLODipine  10 mg Oral Daily  . aspirin  81 mg Oral Daily  . atorvastatin  40 mg Oral QHS  . bisoprolol  5 mg Oral Daily  . clopidogrel  75 mg Oral Daily  . insulin aspart  0-5 Units Subcutaneous QHS  . insulin aspart  0-9 Units Subcutaneous TID WC  . insulin glargine  30 Units Subcutaneous QHS  . ipratropium-albuterol  3 mL Nebulization Q6H  . methylPREDNISolone (SOLU-MEDROL) injection  60 mg Intravenous Q6H   Followed by  . [START ON 01/09/2021] predniSONE  40 mg Oral Q  breakfast  . mometasone-formoterol  2 puff Inhalation BID  . nortriptyline  50 mg Oral QHS  . oxybutynin  10 mg Oral Daily  . pantoprazole  40 mg Oral Daily    Continuous Infusions: . cefTRIAXone (ROCEPHIN)  IV Stopped (01/08/21 0035)     LOS: 1 day     Alma Friendly, MD [facility]  If 7PM-7AM, please contact night-coverage [email or web address] 01/08/2021, 10:14 AM

## 2021-01-08 NOTE — Progress Notes (Signed)
Pt is currently not in any distress BiPAP is not needed at this time. RT will continue to monitor.

## 2021-01-09 ENCOUNTER — Inpatient Hospital Stay (HOSPITAL_COMMUNITY): Payer: No Typology Code available for payment source

## 2021-01-09 DIAGNOSIS — I2583 Coronary atherosclerosis due to lipid rich plaque: Secondary | ICD-10-CM | POA: Diagnosis not present

## 2021-01-09 DIAGNOSIS — L8990 Pressure ulcer of unspecified site, unspecified stage: Secondary | ICD-10-CM | POA: Insufficient documentation

## 2021-01-09 DIAGNOSIS — I361 Nonrheumatic tricuspid (valve) insufficiency: Secondary | ICD-10-CM | POA: Diagnosis not present

## 2021-01-09 DIAGNOSIS — I2510 Atherosclerotic heart disease of native coronary artery without angina pectoris: Secondary | ICD-10-CM | POA: Diagnosis not present

## 2021-01-09 DIAGNOSIS — I351 Nonrheumatic aortic (valve) insufficiency: Secondary | ICD-10-CM | POA: Diagnosis not present

## 2021-01-09 DIAGNOSIS — R0603 Acute respiratory distress: Secondary | ICD-10-CM

## 2021-01-09 DIAGNOSIS — J441 Chronic obstructive pulmonary disease with (acute) exacerbation: Secondary | ICD-10-CM | POA: Diagnosis not present

## 2021-01-09 DIAGNOSIS — R778 Other specified abnormalities of plasma proteins: Secondary | ICD-10-CM | POA: Diagnosis not present

## 2021-01-09 DIAGNOSIS — E440 Moderate protein-calorie malnutrition: Secondary | ICD-10-CM | POA: Insufficient documentation

## 2021-01-09 DIAGNOSIS — J9621 Acute and chronic respiratory failure with hypoxia: Secondary | ICD-10-CM

## 2021-01-09 LAB — CBC WITH DIFFERENTIAL/PLATELET
Abs Immature Granulocytes: 0.02 10*3/uL (ref 0.00–0.07)
Basophils Absolute: 0 10*3/uL (ref 0.0–0.1)
Basophils Relative: 0 %
Eosinophils Absolute: 0 10*3/uL (ref 0.0–0.5)
Eosinophils Relative: 0 %
HCT: 34.4 % — ABNORMAL LOW (ref 39.0–52.0)
Hemoglobin: 11.1 g/dL — ABNORMAL LOW (ref 13.0–17.0)
Immature Granulocytes: 0 %
Lymphocytes Relative: 5 %
Lymphs Abs: 0.4 10*3/uL — ABNORMAL LOW (ref 0.7–4.0)
MCH: 24.7 pg — ABNORMAL LOW (ref 26.0–34.0)
MCHC: 32.3 g/dL (ref 30.0–36.0)
MCV: 76.6 fL — ABNORMAL LOW (ref 80.0–100.0)
Monocytes Absolute: 0.4 10*3/uL (ref 0.1–1.0)
Monocytes Relative: 5 %
Neutro Abs: 7.5 10*3/uL (ref 1.7–7.7)
Neutrophils Relative %: 90 %
Platelets: 338 10*3/uL (ref 150–400)
RBC: 4.49 MIL/uL (ref 4.22–5.81)
RDW: 21.6 % — ABNORMAL HIGH (ref 11.5–15.5)
WBC: 8.3 10*3/uL (ref 4.0–10.5)
nRBC: 0 % (ref 0.0–0.2)

## 2021-01-09 LAB — GLUCOSE, CAPILLARY
Glucose-Capillary: 237 mg/dL — ABNORMAL HIGH (ref 70–99)
Glucose-Capillary: 264 mg/dL — ABNORMAL HIGH (ref 70–99)
Glucose-Capillary: 275 mg/dL — ABNORMAL HIGH (ref 70–99)
Glucose-Capillary: 325 mg/dL — ABNORMAL HIGH (ref 70–99)

## 2021-01-09 LAB — MAGNESIUM: Magnesium: 1.9 mg/dL (ref 1.7–2.4)

## 2021-01-09 LAB — ECHOCARDIOGRAM COMPLETE
Height: 78 in
S' Lateral: 2.6 cm
Weight: 2796.8 oz

## 2021-01-09 LAB — IRON AND TIBC
Iron: 14 ug/dL — ABNORMAL LOW (ref 45–182)
Saturation Ratios: 4 % — ABNORMAL LOW (ref 17.9–39.5)
TIBC: 358 ug/dL (ref 250–450)
UIBC: 344 ug/dL

## 2021-01-09 LAB — TROPONIN I (HIGH SENSITIVITY)
Troponin I (High Sensitivity): 1140 ng/L (ref <18)
Troponin I (High Sensitivity): 982 ng/L (ref <18)

## 2021-01-09 LAB — MRSA PCR SCREENING: MRSA by PCR: NEGATIVE

## 2021-01-09 LAB — BASIC METABOLIC PANEL
Anion gap: 11 (ref 5–15)
BUN: 20 mg/dL (ref 8–23)
CO2: 21 mmol/L — ABNORMAL LOW (ref 22–32)
Calcium: 9 mg/dL (ref 8.9–10.3)
Chloride: 103 mmol/L (ref 98–111)
Creatinine, Ser: 1.04 mg/dL (ref 0.61–1.24)
GFR, Estimated: >60 mL/min (ref >60)
Glucose, Bld: 308 mg/dL — ABNORMAL HIGH (ref 70–99)
Potassium: 4.2 mmol/L (ref 3.5–5.1)
Sodium: 135 mmol/L (ref 135–145)

## 2021-01-09 LAB — VITAMIN B12: Vitamin B-12: 141 pg/mL — ABNORMAL LOW (ref 180–914)

## 2021-01-09 LAB — FERRITIN: Ferritin: 30 ng/mL (ref 24–336)

## 2021-01-09 LAB — FOLATE: Folate: 7.3 ng/mL (ref >5.9)

## 2021-01-09 MED ORDER — IOHEXOL 350 MG/ML SOLN
75.0000 mL | Freq: Once | INTRAVENOUS | Status: AC | PRN
  Administered 2021-01-09: 75 mL via INTRAVENOUS

## 2021-01-09 MED ORDER — FUROSEMIDE 10 MG/ML IJ SOLN
40.0000 mg | Freq: Two times a day (BID) | INTRAMUSCULAR | Status: AC
  Administered 2021-01-09 – 2021-01-10 (×4): 40 mg via INTRAVENOUS
  Filled 2021-01-09 (×4): qty 4

## 2021-01-09 MED ORDER — FERROUS SULFATE 325 (65 FE) MG PO TABS
325.0000 mg | ORAL_TABLET | Freq: Two times a day (BID) | ORAL | Status: DC
  Administered 2021-01-09 – 2021-01-14 (×10): 325 mg via ORAL
  Filled 2021-01-09 (×10): qty 1

## 2021-01-09 MED ORDER — INSULIN ASPART 100 UNIT/ML ~~LOC~~ SOLN
5.0000 [IU] | Freq: Three times a day (TID) | SUBCUTANEOUS | Status: DC
  Administered 2021-01-09: 5 [IU] via SUBCUTANEOUS

## 2021-01-09 MED ORDER — COVID-19 MRNA VACC (MODERNA) 100 MCG/0.5ML IM SUSP: 0.5000 mL | Freq: Once | INTRAMUSCULAR | Status: DC

## 2021-01-09 MED ORDER — ENSURE MAX PROTEIN PO LIQD
11.0000 [oz_av] | Freq: Every day | ORAL | Status: DC
  Administered 2021-01-11: 22:00:00 11 [oz_av] via ORAL
  Filled 2021-01-09 (×6): qty 330

## 2021-01-09 MED ORDER — MAGNESIUM OXIDE 400 (241.3 MG) MG PO TABS
400.0000 mg | ORAL_TABLET | Freq: Every day | ORAL | Status: DC
  Administered 2021-01-09 – 2021-01-14 (×6): 400 mg via ORAL
  Filled 2021-01-09 (×6): qty 1

## 2021-01-09 MED ORDER — VITAMIN B-12 1000 MCG PO TABS
1000.0000 ug | ORAL_TABLET | Freq: Every day | ORAL | Status: DC
  Administered 2021-01-10 – 2021-01-14 (×5): 1000 ug via ORAL
  Filled 2021-01-09 (×5): qty 1

## 2021-01-09 MED ORDER — INSULIN GLARGINE 100 UNIT/ML ~~LOC~~ SOLN
20.0000 [IU] | Freq: Two times a day (BID) | SUBCUTANEOUS | Status: DC
  Administered 2021-01-09: 20 [IU] via SUBCUTANEOUS
  Filled 2021-01-09 (×3): qty 0.2

## 2021-01-09 MED ORDER — CYANOCOBALAMIN 1000 MCG/ML IJ SOLN
1000.0000 ug | Freq: Once | INTRAMUSCULAR | Status: AC
  Administered 2021-01-09: 1000 ug via SUBCUTANEOUS
  Filled 2021-01-09 (×2): qty 1

## 2021-01-09 MED ORDER — ADULT MULTIVITAMIN W/MINERALS CH
1.0000 | ORAL_TABLET | Freq: Every day | ORAL | Status: DC
  Administered 2021-01-09 – 2021-01-14 (×6): 1 via ORAL
  Filled 2021-01-09 (×6): qty 1

## 2021-01-09 MED ORDER — GLUCERNA SHAKE PO LIQD
237.0000 mL | Freq: Two times a day (BID) | ORAL | Status: DC
  Administered 2021-01-09 – 2021-01-12 (×6): 237 mL via ORAL

## 2021-01-09 NOTE — Progress Notes (Signed)
Inpatient Diabetes Program Recommendations  AACE/ADA: New Consensus Statement on Inpatient Glycemic Control (2015)  Target Ranges:  Prepandial:   less than 140 mg/dL      Peak postprandial:   less than 180 mg/dL (1-2 hours)      Critically ill patients:  140 - 180 mg/dL   Lab Results  Component Value Date   GLUCAP 258 (H) 01/08/2021   HGBA1C 6.4 (H) 01/07/2021    Review of Glycemic Control Results for Brandon [name], Brandon [name] (MRN [id number]) as of 01/09/2021 12:19  Ref. Range 01/08/2021 09:08 01/08/2021 12:45 01/08/2021 17:51 01/08/2021 20:59  Glucose-Capillary Latest Ref Range: 70 - 99 mg/dL 257 (H) 309 (H) 266 (H) 258 (H)   Outpatient Diabetes medications:  70/30 45 units bid Metformin 1000 BID Current orders for Inpatient glycemic control:  Novolog 0-9 units tid & 0-5 units qhs Lantus 30 units qhs Prednisone 40 QD  Inpatient Diabetes Program Recommendations:    CBG's not uploading this morning or prior to lunch.  Contacted Poonam, RN and his fasting was 237 mg/dL and noon was 264 mg/dL.  Please consider Lantus 25 units BID (80% of home dose basal) & Novolog 5 units TID with meals if eats at least 50% of meal.   Will continue to follow while inpatient.  Thank you, Reche Dixon, RN, BSN Diabetes Coordinator Inpatient Diabetes Program [phone] (team pager from 8a-5p)

## 2021-01-09 NOTE — Progress Notes (Addendum)
Initial Nutrition Assessment  DOCUMENTATION CODES:   Non-severe (moderate) malnutrition in context of chronic illness  INTERVENTION:   -Glucerna Shake po BID, each supplement provides 220 kcal and 10 grams of protein -Ensure Max po daily, each supplement provides 150 kcal and 30 grams of protein.  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  GOAL:   Patient will meet greater than or equal to 90% of their needs  MONITOR:   PO intake,Supplement acceptance,Diet advancement,Labs,Weight trends,Skin,I & O's  REASON FOR ASSESSMENT:   Consult Assessment of nutrition requirement/status  ASSESSMENT:   75 y.o. 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   Reviewed I/O's: -62 ml x 24 hours  UOP: 450 ml x 24 hours  Spoke with pt at bedside, who reports feeling better today. He reports good appetite PTA, usually consuming 2 meals per day (Breakfast either cereal (Special K or cinnamon toast crunch or grits and eggs) and Dinner of meat, starch, and vegetable).   Pt reports eating well during admission, stating he ate "almost all" of his breakfast. Noted documented meal completions 50%.   Pt denies any weight loss. Noted variability in weight readings; unsure which wt is accurate.   Discussed importance of good meal and supplement intake to promote healing.   Medications reviewed and include lasix, magnesium oxide, prednisone, and vitamin B-12.   Lab Results  Component Value Date   HGBA1C 6.4 (H) 01/07/2021   PTA DM medications are 1000 mg metformin BID and 45 units inuslin aspart protamin aspart BID.    Labs reviewed: CBGS: 258-266 (inpatient orders for glycemic control are 0-5 units insulin aspart daily at bedtime, 0-9 units insulin aspart TID with meals, and 30 units insulin glargine daily at bedtime).   NUTRITION - FOCUSED PHYSICAL EXAM:  Flowsheet Row Most Recent Value  Orbital Region Moderate depletion  Upper Arm Region Mild depletion  Thoracic and Lumbar Region Mild depletion  Buccal Region Mild depletion  Temple Region Moderate depletion  Clavicle Bone Region Mild depletion  Clavicle and Acromion Bone Region Mild depletion  Scapular Bone Region Mild depletion  Dorsal Hand Mild depletion  Patellar Region Mild depletion  Anterior Thigh Region Mild depletion  Posterior Calf Region Mild depletion  Edema (RD Assessment) None  Hair Reviewed  Eyes Reviewed  Mouth Reviewed  Skin Reviewed  Nails Reviewed       Diet Order:   Diet Order            Diet heart healthy/carb modified Room service appropriate? Yes; Fluid consistency: Thin  Diet effective now                 EDUCATION NEEDS:   Education needs have been addressed  Skin:  Skin Assessment: Skin Integrity Issues: Skin Integrity Issues:: Stage II Stage II: lt ear  Last BM:  01/08/21  Height:   Ht Readings from Last 1 Encounters:  01/07/21 6\' 6"  (1.981 m)    Weight:   Wt Readings from Last 1 Encounters:  01/09/21 79.3 kg    Ideal Body Weight:  97.3 kg  BMI:  Body mass index is 20.2 kg/m.  Estimated Nutritional Needs:   Kcal:  3532-9924  Protein:  130-145 grams  Fluid:  > 2 L    Loistine Chance, RD, LDN, CDCES Registered Dietitian II Certified Diabetes Care and Education Specialist Please refer to [facility] for RD and/or RD on-call/weekend/after hours pager

## 2021-01-09 NOTE — Evaluation (Signed)
Occupational Therapy Evaluation Patient Details Name: Brandon [name] MRN: [id number] DOB: 06/17/1946 Today's Date: 01/09/2021    History of Present Illness Pt is a 75 year old man admitted on 01/09/20 with SOB. 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   Clinical Impression   Pt walks within his home without a device and is modified independent in ADL, light meal prep and housekeeping and med management. His daughter comes from [place] weekly to get groceries and check on him. Pt reports he increased his own 02 to 4.5L about a year ago. He presents with generalized weakness, impaired dynamic standing balance and decreased activity tolerance. He is currently dependent on 7L 02. Will follow acutely. Recommend HHOT upon discharge.     Follow Up Recommendations  Home health OT    Equipment Recommendations  None recommended by OT    Recommendations for Other Services       Precautions / Restrictions Precautions Precautions: Fall (no falls recently)      Mobility Bed Mobility               General bed mobility comments: pt received in chair    Transfers Overall transfer level: Needs assistance Equipment used: 1 person hand held assist Transfers: Sit to/from Stand Sit to Stand: Min guard         General transfer comment: min guard to stand, hand held assist to ambulate in room    Balance Overall balance assessment: Needs assistance   Sitting balance-Leahy Scale: Good       Standing balance-Leahy Scale: Fair Standing balance comment: statically at sink                           ADL either performed or assessed with clinical judgement   ADL Overall ADL's : Needs assistance/impaired Eating/Feeding: Independent   Grooming: Min guard;Standing;Wash/dry hands   Upper Body Bathing: Set up;Sitting   Lower Body Bathing: Min guard;Sit to/from stand   Upper Body Dressing : Set  up;Sitting   Lower Body Dressing: Min guard;Sit to/from stand   Toilet Transfer: Minimal assistance;Ambulation   Toileting- Clothing Manipulation and Hygiene: Min guard;Sit to/from stand       Functional mobility during ADLs: Minimal assistance General ADL Comments: pt currently utilizing 7L 02     Vision Patient Visual Report: No change from baseline Additional Comments: hx of cataract sx     Perception     Praxis      Pertinent Vitals/Pain Pain Assessment: No/denies pain     Hand Dominance Right   Extremity/Trunk Assessment Upper Extremity Assessment Upper Extremity Assessment: Overall WFL for tasks assessed   Lower Extremity Assessment Lower Extremity Assessment: Defer to PT evaluation       Communication Communication Communication: HOH   Cognition Arousal/Alertness: Awake/alert Behavior During Therapy: WFL for tasks assessed/performed Overall Cognitive Status: Within Functional Limits for tasks assessed                                     General Comments       Exercises     Shoulder Instructions      Home Living Family/patient expects to be discharged to:: Private residence Living Arrangements: Alone Available Help at Discharge: Family;Available PRN/intermittently (daughter visits weekly) Type of Home: House Home Access: Stairs to enter  Home Layout: One level     Bathroom Shower/Tub: Teacher, early years/pre: Standard     Home Equipment: Grab bars - tub/shower;Shower seat;Other (comment);Cane - single point;Walker - 2 wheels (02 4.5L)   Additional Comments[facility] soon      Prior Functioning/Environment Level of Independence: Needs assistance  Gait / Transfers Assistance Needed: independent with ambulation ADL's / Homemaking Assistance Needed: Pt is able to perform ADLs mod I.  Pt's daughter visits every week and assists with finances, groceries and  cleaning.  Pt manges his own meds, and prepares his own meals.  He does not drive.            OT Problem List: Decreased strength;Decreased activity tolerance;Impaired balance (sitting and/or standing);Cardiopulmonary status limiting activity      OT Treatment/Interventions: Self-care/ADL training;Energy conservation;DME and/or AE instruction;Therapeutic activities;Patient/family education;Balance training    OT Goals(Current goals can be found in the care plan section) Acute Rehab OT Goals Patient Stated Goal: return home OT Goal Formulation: With patient Time For Goal Achievement: 01/23/21 Potential to Achieve Goals: Good ADL Goals Pt Will Perform Grooming: with supervision;standing Pt Will Perform Lower Body Bathing: with supervision;sit to/from stand Pt Will Perform Lower Body Dressing: with supervision;sit to/from stand Pt Will Transfer to Toilet: with supervision;ambulating Pt Will Perform Toileting - Clothing Manipulation and hygiene: with supervision;sit to/from stand Additional ADL Goal #1: Pt will generalize energy conservation and pursed lip breathing strategies during ADL and mobility.  OT Frequency: Min 2X/week   Barriers to D/C:            Co-evaluation              AM-PAC OT "6 Clicks" Daily Activity     Outcome Measure Help from another person eating meals?: None Help from another person taking care of personal grooming?: A Little Help from another person toileting, which includes using toliet, bedpan, or urinal?: A Little Help from another person bathing (including washing, rinsing, drying)?: A Little Help from another person to put on and taking off regular upper body clothing?: None Help from another person to put on and taking off regular lower body clothing?: A Little 6 Click Score: 20   End of Session Equipment Utilized During Treatment: Oxygen (7L)  Activity Tolerance: Patient tolerated treatment well Patient left: in chair;with call  bell/phone within reach  OT Visit Diagnosis: Unsteadiness on feet (R26.81);Other abnormalities of gait and mobility (R26.89);Muscle weakness (generalized) (M62.81);Other (comment) (decreased activity tolerance)                Time: 9166-0600 OT Time Calculation (min): 17 min Charges:  OT General Charges $OT Visit: 1 Visit OT Evaluation $OT Eval Moderate Complexity: 1 Mod  Nestor Lewandowsky, OTR/L Acute Rehabilitation Services Pager: [phone] Office: [phone]  Malka So 01/09/2021, 12:33 PM

## 2021-01-09 NOTE — Progress Notes (Signed)
Pt not in need of BiPAP at this time. Rt will continue to monitor. 

## 2021-01-09 NOTE — Progress Notes (Signed)
PROGRESS NOTE  Brandon [name] [id number] DOB: [birth year] DOA: 01/07/2021 PCP: Pcp, No  HPI/Recap of past 24 hours: HPI from Dr Seward Carol is a 75 y.o. 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 Not vaccinated for COVID he would like to but had trouble with transportation. Denies any chest pain, reports chronic abd distention since his prostate surgery as per pt. in the ED, patient was noted to be requiring about 10 L of oxygen on presentation, chest x-ray showed emphysematous changes, Covid and flu negative.  Patient admitted for further management.    Today, patient continues to report shortness of breath, denies any worsening, continues to deny any chest pain, cough, fever/chills, abdominal pain, nausea/vomiting.   Assessment/Plan: Active Problems:   Essential hypertension   Type 2 diabetes mellitus (HCC)   CAD (coronary artery disease)   GERD (gastroesophageal reflux disease)   Acute on chronic respiratory failure with hypoxemia (HCC)   Tobacco abuse   Hyponatremia   Elevated troponin   COPD with acute exacerbation (HCC)   History of CVA (cerebrovascular accident)   COPD exacerbation (HCC)   Hypomagnesemia   Pressure injury of skin   Malnutrition of moderate degree   Acute on chronic hypoxic respiratory failure Possibly COPD with acute exacerbation On 4 L of O2 at baseline, required about 10L on presentation Currently afebrile, with no leukocytosis Chest x-ray with evidence of emphysematous changes D-dimer elevated, CTA chest with no evidence of PE Covid, flu panel negative Continue duo nebs, inhalers, steroids, Mucinex Continue ceftriaxone for total of 5 days Continue supplemental oxygen, BiPAP as  needed Monitor closely  NSTEMI/severe pulmonary hypertension Elevated troponin EKG with no acute ST changes Echo showed EF of 60 to 65%, no regional wall motion abnormality, noted PAH pressure 64.6 mmHg Cardiology on board, plan for cardiac cath on 01/11/2021, started on Lasix Continue Lasix, Cardizem (as rec by cardiology) Telemetry  Diabetes mellitus type 2 Last A1c 6.4 on 01/07/2021, poor control, currently 2/2 steroid use SSI, Lantus, Accu-Cheks, hypoglycemic protocol Diabetes coordinator, appreciate recs  Hypomagnesemia Replace as needed  Microcytic anemia/vitamin B12 deficiency Hemoglobin somewhat around baseline Anemia panel showed iron 14, sats 4, B12 141 Oral iron and vitamin B12 supplementation Daily CBC  Hypertension BP stable Continue Cardizem  History of CAD/CVA Currently chest pain-free Continue aspirin, statins, Plavix, switched to bisoprolol due to COPD exacerbation  GERD Continue pantoprazole      Estimated body mass index is 20.2 kg/m as calculated from the following:   Height as of this encounter: 6\' 6"  (1.981 m).   Weight as of this encounter: 79.3 kg.     Code Status: Full  Family Communication: None at bedside  Disposition Plan: Status is: Inpatient  Remains inpatient appropriate because:Inpatient level of care appropriate due to severity of illness   Dispo: The patient is from: Home              Anticipated d/c is to: Home              Anticipated d/c date is: 3 days              Patient currently is not medically stable to d/c.    Consultants:  Cardiology  Procedures:  None  Antimicrobials:  Ceftriaxone  DVT prophylaxis: Lovenox   Objective: Vitals:   01/08/21 2128 01/09/21 0600 01/09/21 0800 01/09/21 1213  BP:  113/65  117/69  Pulse:  93 99   Resp:  20    Temp:  97.8 F (36.6 C)    TempSrc:  Oral    SpO2: 98% 92% 92%   Weight:  79.3 kg    Height:        Intake/Output Summary (Last 24 hours) at 01/09/2021  1457 Last data filed at 01/09/2021 0800 Gross per 24 hour  Intake 628.12 ml  Output 450 ml  Net 178.12 ml   Filed Weights   01/07/21 1411 01/09/21 0600  Weight: 95.7 kg 79.3 kg    Exam:  General: NAD, deconditioned, chronically ill-appearing  Cardiovascular: S1, S2 present  Respiratory:  Diminished breath sounds bilaterally  Abdomen: Soft, nontender, distended, bowel sounds present  Musculoskeletal: Trace bilateral pedal edema noted, with chronic venous stasis as well as dryness/flaking noted on BLE  Skin:  As noted above  Psychiatry: Normal mood   Data Reviewed: CBC: Recent Labs  Lab 01/07/21 1408 01/07/21 2336 01/08/21 0339 01/08/21 0755 01/09/21 0234  WBC 9.1  --  5.1 5.7 8.3  NEUTROABS 7.4  --  4.4 4.9 7.5  HGB 11.9* 12.9* 10.7* 10.9* 11.1*  HCT 38.3* 38.0* 34.0* 37.6* 34.4*  MCV 79.5*  --  78.2* 80.9 76.6*  PLT 341  --  342 328 376   Basic Metabolic Panel: Recent Labs  Lab 01/07/21 1408 01/07/21 1638 01/07/21 2336 01/08/21 0339 01/09/21 0234  NA 131*  --  134* 134* 135  K 5.0  --  4.8 4.9 4.2  CL 103  --   --  106 103  CO2 15*  --   --  18* 21*  GLUCOSE 150*  --   --  287* 308*  BUN 18  --   --  15 20  CREATININE 1.25*  --   --  1.10 1.04  CALCIUM 9.0  --   --  8.5* 9.0  MG  --  1.5*  --  1.6* 1.9  PHOS  --   --   --  4.5  --    GFR: Estimated Creatinine Clearance: 68.8 mL/min (by C-G formula based on SCr of 1.04 mg/dL). Liver Function Tests: Recent Labs  Lab 01/07/21 1408 01/08/21 0339  AST 20 14*  ALT 17 16  ALKPHOS 54 47  BILITOT 0.4 0.7  PROT 6.5 5.7*  ALBUMIN 3.4* 2.9*   No results for input(s): LIPASE, AMYLASE in the last 168 hours. No results for input(s): AMMONIA in the last 168 hours. Coagulation Profile: No results for input(s): INR, PROTIME in the last 168 hours. Cardiac Enzymes: No results for input(s): CKTOTAL, CKMB, CKMBINDEX, TROPONINI in the last 168 hours. BNP (last 3 results) No results for input(s): PROBNP in  the last 8760 hours. HbA1C: Recent Labs    01/07/21 2006  HGBA1C 6.4*   CBG: Recent Labs  Lab 01/07/21 2124 01/08/21 0908 01/08/21 1245 01/08/21 1751 01/08/21 2059  GLUCAP 175* 257* 309* 266* 258*   Lipid Profile: No results for input(s): CHOL, HDL, LDLCALC, TRIG, CHOLHDL, LDLDIRECT in the last 72 hours. Thyroid Function Tests: Recent Labs    01/08/21 0339  TSH 0.533   Anemia Panel: Recent Labs    01/09/21 0234  VITAMINB12 141*  FOLATE 7.3  FERRITIN 30  TIBC 358  IRON 14*   Urine analysis:  Component Value Date/Time   COLORURINE YELLOW 01/07/2021 2010   APPEARANCEUR CLEAR 01/07/2021 2010   LABSPEC 1.006 01/07/2021 2010   PHURINE 5.0 01/07/2021 2010   GLUCOSEU NEGATIVE 01/07/2021 2010   HGBUR NEGATIVE 01/07/2021 2010   [place] NEGATIVE 01/07/2021 2010   [place] NEGATIVE 01/07/2021 2010   PROTEINUR NEGATIVE 01/07/2021 2010   NITRITE NEGATIVE 01/07/2021 2010   LEUKOCYTESUR NEGATIVE 01/07/2021 2010   Sepsis Labs: @LABRCNTIP (procalcitonin:4,lacticidven:4)  ) Recent Results (from the past 240 hour(s))  Resp Panel by RT-PCR (Flu A&B, Covid) Nasopharyngeal Swab     Status: None   Collection Time: 01/07/21  3:41 PM   Specimen: Nasopharyngeal Swab; Nasopharyngeal(NP) swabs in vial transport medium  Result Value Ref Range Status   SARS Coronavirus 2 by RT PCR NEGATIVE NEGATIVE Final    Comment: (NOTE) SARS-CoV-2 target nucleic acids are NOT DETECTED.  The SARS-CoV-2 RNA is generally detectable in upper respiratory specimens during the acute phase of infection. The lowest concentration of SARS-CoV-2 viral copies this assay can detect is 138 copies/mL. A negative result does not preclude SARS-Cov-2 infection and should not be used as the sole basis for treatment or other patient management decisions. A negative result may occur with  improper specimen collection/handling, submission of specimen other than nasopharyngeal swab, presence of viral  mutation(s) within the areas targeted by this assay, and inadequate number of viral copies(<138 copies/mL). A negative result must be combined with clinical observations, patient history, and epidemiological information. The expected result is Negative.  Fact Sheet for Patients:  EntrepreneurPulse.com.au  Fact Sheet for Healthcare Providers:  IncredibleEmployment.be  This test is no t yet approved or cleared by the [place] FDA and  has been authorized for detection and/or diagnosis of SARS-CoV-2 by FDA under an Emergency Use Authorization (EUA). This EUA will remain  in effect (meaning this test can be used) for the duration of the COVID-19 declaration under Section 564(b)(1) of the Act, 21 U.S.C.section 360bbb-3(b)(1), unless the authorization is terminated  or revoked sooner.       Influenza A by PCR NEGATIVE NEGATIVE Final   Influenza B by PCR NEGATIVE NEGATIVE Final    Comment: (NOTE) The Xpert Xpress SARS-CoV-2/FLU/RSV plus assay is intended as an aid in the diagnosis of influenza from Nasopharyngeal swab specimens and should not be used as a sole basis for treatment. Nasal washings and aspirates are unacceptable for Xpert Xpress SARS-CoV-2/FLU/RSV testing.  Fact Sheet for Patients: EntrepreneurPulse.com.au  Fact Sheet for Healthcare Providers: IncredibleEmployment.be  This test is not yet approved or cleared by the [place] FDA and has been authorized for detection and/or diagnosis of SARS-CoV-2 by FDA under an Emergency Use Authorization (EUA). This EUA will remain in effect (meaning this test can be used) for the duration of the COVID-19 declaration under Section 564(b)(1) of the Act, 21 U.S.C. section 360bbb-3(b)(1), unless the authorization is terminated or revoked.  Performed at [facility], [place] [address] [place], [place] [address]   MRSA PCR Screening     Status: None    Collection Time: 01/09/21 12:17 AM   Specimen: Nasal Mucosa; Nasopharyngeal  Result Value Ref Range Status   MRSA by PCR NEGATIVE NEGATIVE Final    Comment:        The GeneXpert MRSA Assay (FDA approved for NASAL specimens only), is one component of a comprehensive MRSA colonization surveillance program. It is not intended to diagnose MRSA infection nor to guide or monitor treatment for MRSA infections. Performed at [facility], [address] [place], [place] [address]       Studies: CT ANGIO CHEST PE W OR WO CONTRAST  Result Date: 01/09/2021 CLINICAL DATA:  COPD, short of breath. Prostate cancer. Rule out pulmonary embolism. EXAM: CT ANGIOGRAPHY CHEST WITH CONTRAST TECHNIQUE: Multidetector CT imaging of the chest was performed using the standard protocol during bolus administration of intravenous contrast. Multiplanar CT image reconstructions and MIPs were obtained to evaluate the vascular anatomy. CONTRAST:  84mL OMNIPAQUE IOHEXOL 350 MG/ML SOLN COMPARISON:  CT chest 10/13/2020 FINDINGS: Cardiovascular: Negative for pulmonary embolism. Pulmonary arteries diffusely enlarged compatible with pulmonary artery hypertension from COPD. Main pulmonary artery 3.8 cm in diameter. Atherosclerotic calcification aortic arch. Ascending aorta dilated at 4.7 cm unchanged from the prior study. No dissection. Extensive coronary artery calcification involving right and left coronary arteries. Heart size upper normal. No pericardial effusion. Mediastinum/Nodes: Negative for mass or adenopathy. Lungs/Pleura: Mild to moderate bilateral pleural effusions right greater than left with progression from the prior study. Mild dependent atelectasis in both lung bases. Underlying COPD with emphysema most notably in the apices. No acute infiltrate or mass in the lungs. Upper Abdomen: Liver cyst 6 x 4 cm unchanged from the prior study. Atherosclerotic calcification abdominal aorta and renal arteries.  Musculoskeletal: Multilevel thoracic disc degeneration and spurring. Sclerotic findings in the vertebral bodies appear degenerative. Negative for fracture. Review of the MIP images confirms the above findings. IMPRESSION: 1. Negative for pulmonary embolism 2. COPD with pulmonary artery hypertension 3. Enlarging bilateral pleural effusions right greater than left. 4. Aortic aneurysm NOS (ICD10-I71.9). 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 5. Extensive coronary calcification. Aortic Atherosclerosis (ICD10-I70.0) and Emphysema (ICD10-J43.9). Electronically Signed   By: Franchot Gallo M.D.   On: 01/09/2021 09:13   ECHOCARDIOGRAM COMPLETE  Result Date: 01/09/2021    ECHOCARDIOGRAM REPORT   Patient Name:   Brandon [name] Date of Exam: 01/09/2021 Medical Rec #:  [id number]     Height:       78.0 in Accession #:    [id number]    Weight:       174.8 lb Date of Birth:  1946-04-28     BSA:          2.132 m Patient Age:    26 years      BP:           113/65 mmHg Patient Gender: M             HR:           93 bpm. Exam Location:  Inpatient Procedure: 2D Echo Indications:    elevated troponin  History:        Patient has prior history of Echocardiogram examinations, most                 recent 10/14/2020. COPD; Risk Factors:Diabetes.  Sonographer:    Johny Chess Referring Phys: [place]  1. Left ventricular ejection fraction, by estimation, is 60 to 65%. The left ventricle has normal function. The left ventricle has no regional wall motion abnormalities. There is mild concentric left ventricular hypertrophy and moderate basal septal hypertrophy.  2. Right ventricular systolic function is moderately reduced. The right ventricular size is moderately enlarged. There is severely elevated pulmonary artery systolic pressure. The estimated right ventricular systolic pressure is 41.6 mmHg.  3. The mitral valve is degenerative. No evidence of mitral valve regurgitation. No evidence of mitral  stenosis.  4. The inferior vena cava is dilated in  size with >50% respiratory variability, suggesting right atrial pressure of 8 mmHg.  5. The aortic valve is tricuspid. There is moderate calcification of the aortic valve primarily of the left coronary cusp. There is moderate thickening of the aortic valve. Aortic valve regurgitation is mild. Mild to moderate aortic valve sclerosis/calcification is present, without any evidence of aortic stenosis.  6. There is mild dilatation at the level of the sinuses of Valsalva, measuring 37 mm. There is mild dilatation of the ascending aorta, measuring 42 mm.  7. Tricuspid valve regurgitation is mild to moderate.  8. Right atrial size was moderately dilated. FINDINGS  Left Ventricle: Left ventricular ejection fraction, by estimation, is 65 to 70%. The left ventricle has normal function. The left ventricle has no regional wall motion abnormalities. The left ventricular internal cavity size was normal in size. There is  mild concentric left ventricular hypertrophy. Left ventricular diastolic function could not be evaluated. Right Ventricle: The right ventricular size is moderately enlarged. No increase in right ventricular wall thickness. Right ventricular systolic function is moderately reduced. There is severely elevated pulmonary artery systolic pressure. The tricuspid regurgitant velocity is 3.76 m/s, and with an assumed right atrial pressure of 8 mmHg, the estimated right ventricular systolic pressure is [id number] mmHg. Left Atrium: Left atrial size was normal in size. Right Atrium: Right atrial size was moderately dilated. Pericardium: There is no evidence of pericardial effusion. Mitral Valve: The mitral valve is degenerative in appearance. There is mild thickening of the mitral valve leaflet(s). There is mild calcification of the mitral valve leaflet(s). Mild to moderate mitral annular calcification. No evidence of mitral valve regurgitation. No evidence of mitral valve  stenosis. Tricuspid Valve: The tricuspid valve is normal in structure. Tricuspid valve regurgitation is mild to moderate. No evidence of tricuspid stenosis. Aortic Valve: The aortic valve is tricuspid. There is moderate calcification of the aortic valve. There is moderate thickening of the aortic valve. Aortic valve regurgitation is mild. Mild to moderate aortic valve sclerosis/calcification is present, without any evidence of aortic stenosis. Pulmonic Valve: The pulmonic valve was normal in structure. Pulmonic valve regurgitation is trivial. No evidence of pulmonic stenosis. Aorta: The aortic root is normal in size and structure. There is mild dilatation at the level of the sinuses of Valsalva, measuring 37 mm. There is mild dilatation of the ascending aorta, measuring 42 mm. Venous: The inferior vena cava is dilated in size with greater than 50% respiratory variability, suggesting right atrial pressure of 8 mmHg. IAS/Shunts: No atrial level shunt detected by color flow Doppler.  LEFT VENTRICLE PLAX 2D LVIDd:         4.10 cm LVIDs:         2.60 cm LV PW:         1.20 cm LV IVS:        1.30 cm LVOT diam:     2.30 cm LV SV:         88 LV SV Index:   41 LVOT Area:     4.15 cm  RIGHT VENTRICLE             IVC RV S prime:     13.60 cm/s  IVC diam: 2.50 cm TAPSE (M-mode): 1.6 cm LEFT ATRIUM             Index       RIGHT ATRIUM           Index LA diam:        4.20 cm  1.97 cm/m  RA Area:     19.50 cm LA Vol (A2C):   50.5 ml 23.69 ml/m RA Volume:   55.30 ml  25.94 ml/m LA Vol (A4C):   71.4 ml 33.49 ml/m LA Biplane Vol: 64.9 ml 30.44 ml/m  AORTIC VALVE LVOT Vmax:   96.00 cm/s LVOT Vmean:  66.400 cm/s LVOT VTI:    0.212 m  AORTA Ao Root diam: 3.70 cm Ao Asc diam:  4.20 cm TRICUSPID VALVE TR Peak grad:   56.6 mmHg TR Vmax:        376.00 cm/s  SHUNTS Systemic VTI:  0.21 m Systemic Diam: 2.30 cm Fransico Him MD Electronically signed by Fransico Him MD Signature Date/Time: 01/09/2021/10:24:02 AM    Final     Scheduled  Meds: . aspirin  81 mg Oral Daily  . atorvastatin  40 mg Oral QHS  . bisoprolol  5 mg Oral Daily  . clopidogrel  75 mg Oral Daily  . diltiazem  30 mg Oral Q6H  . enoxaparin (LOVENOX) injection  40 mg Subcutaneous Q24H  . ferrous sulfate  325 mg Oral BID WC  . furosemide  40 mg Intravenous BID  . insulin aspart  0-5 Units Subcutaneous QHS  . insulin aspart  0-9 Units Subcutaneous TID WC  . insulin aspart  5 Units Subcutaneous TID WC  . insulin glargine  20 Units Subcutaneous BID  . ipratropium-albuterol  3 mL Nebulization TID  . magnesium oxide  400 mg Oral Daily  . mometasone-formoterol  2 puff Inhalation BID  . nortriptyline  50 mg Oral QHS  . oxybutynin  10 mg Oral Daily  . pantoprazole  40 mg Oral Daily  . predniSONE  40 mg Oral Q breakfast  . [START ON 01/10/2021] vitamin B-12  1,000 mcg Oral Daily    Continuous Infusions: . cefTRIAXone (ROCEPHIN)  IV 1 g (01/08/21 2212)     LOS: 2 days     Alma Friendly, MD [facility]  If 7PM-7AM, please contact night-coverage [email or web address] 01/09/2021, 2:57 PM

## 2021-01-09 NOTE — Progress Notes (Signed)
  Echocardiogram 2D Echocardiogram has been performed.  Brandon [name] 01/09/2021, 9:55 AM

## 2021-01-09 NOTE — Evaluation (Signed)
Physical Therapy Evaluation Patient Details Name: Brandon [name] MRN: [id number] DOB: 1946-10-09 Today's Date: 01/09/2021   History of Present Illness  Pt is a 75 year old man admitted on 01/09/20 with SOB. 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  Clinical Impression  Pt was seen for mobility on RW and [facility] but most effective to use HHA with pt attending to the task.  He is able to lift walker over an obstacle, clearly not needed but is going to merit observation by family and home therapy from PT to make progress with transition home safely.  Pt is motivated to move, and will benefit from reinforcement to ask for help and let staff monitor him for the duration of his stay.  Follow up with him and will also benefit from stairclimbing when his energy permits.    Follow Up Recommendations Home health PT;Supervision for mobility/OOB    Equipment Recommendations  None recommended by PT    Recommendations for Other Services       Precautions / Restrictions Precautions Precautions: Fall Precaution Comments: no AD needed prev Restrictions Weight Bearing Restrictions: No Other Position/Activity Restrictions: mild instability to right side      Mobility  Bed Mobility Overal bed mobility: Needs Assistance Bed Mobility: Rolling;Sidelying to Sit Rolling: Min guard Sidelying to sit: Min guard       General bed mobility comments: pt used bed rail for final support of mobility    Transfers Overall transfer level: Needs assistance Equipment used: 1 person hand held assist;Rolling walker (2 wheeled);Straight cane Transfers: Sit to/from Stand Sit to Stand: Min guard         General transfer comment: used all AD's to practice standing but is best to use his hands  Ambulation/Gait Ambulation/Gait assistance: Min guard Gait Distance (Feet): 40 Feet Assistive device: Rolling walker (2 wheeled);1 person hand  held assist;Straight cane Gait Pattern/deviations: Step-through pattern;Decreased stride length;Wide base of support;Trunk flexed Gait velocity: reduced Gait velocity interpretation: <1.31 ft/sec, indicative of household ambulator General Gait Details: pt does not need RW, lifts it over obstacles.  HHA or supervision is best  Financial trader Rankin (Stroke Patients Only)       Balance Overall balance assessment: Needs assistance Sitting-balance support: Bilateral upper extremity supported;Feet supported Sitting balance-Leahy Scale: Good     Standing balance support: Bilateral upper extremity supported;Single extremity supported Standing balance-Leahy Scale: Fair                               Pertinent Vitals/Pain Pain Assessment: No/denies pain    Home Living Family/patient expects to be discharged to:: Private residence Living Arrangements: Alone Available Help at Discharge: Family;Available PRN/intermittently Type of Home: House Home Access: Stairs to enter Entrance Stairs-Rails: Right;Left;Can reach both Entrance Stairs-Number of Steps: 3 Home Layout: One level Home Equipment: Grab bars - tub/shower;Shower seat;Other (comment);Cane - single point;Walker - 2 wheels Additional Comments: per OT interview pt is getting BR reno and wheelchair    Prior Function Level of Independence: Needs assistance   Gait / Transfers Assistance Needed: independent with no AD, no recent falls  ADL's / Homemaking Assistance Needed: daughter assists with cleaning and finances, pt can cook and do self care, does his meds  Comments: Drives. No falls. Does not cook much.  Wears 3.5 L 02 at home     Hand Dominance   Dominant Hand: Right    Extremity/Trunk Assessment   Upper Extremity Assessment Upper Extremity Assessment: Defer to OT evaluation    Lower Extremity Assessment Lower Extremity Assessment: Overall WFL for tasks  assessed    Cervical / Trunk Assessment Cervical / Trunk Assessment: Kyphotic (mild)  Communication   Communication: HOH  Cognition Arousal/Alertness: Awake/alert Behavior During Therapy: WFL for tasks assessed/performed Overall Cognitive Status: Within Functional Limits for tasks assessed                                        General Comments General comments (skin integrity, edema, etc.): pt was seen for mobility on RW SPC and no AD.  He is somewhat distracted at times but can reach and steady himself quickly.  Will need HHPT    Exercises     Assessment/Plan    PT Assessment Patient needs continued PT services  PT Problem List Decreased range of motion;Decreased balance;Decreased coordination;Decreased safety awareness       PT Treatment Interventions DME instruction;Gait training;Stair training;Functional mobility training;Therapeutic activities;Therapeutic exercise;Balance training;Neuromuscular re-education;Patient/family education    PT Goals (Current goals can be found in the Care Plan section)  Acute Rehab PT Goals Patient Stated Goal: return home PT Goal Formulation: With patient Time For Goal Achievement: 01/16/21 Potential to Achieve Goals: Good    Frequency Min 3X/week   Barriers to discharge Decreased caregiver support home with sporadic family help    Co-evaluation               AM-PAC PT "6 Clicks" Mobility  Outcome Measure Help needed turning from your back to your side while in a flat bed without using bedrails?: A Little Help needed moving from lying on your back to sitting on the side of a flat bed without using bedrails?: A Little Help needed moving to and from a bed to a chair (including a wheelchair)?: A Little Help needed standing up from a chair using your arms (e.g., wheelchair or bedside chair)?: A Little Help needed to walk in hospital room?: A Little Help needed climbing 3-5 steps with a railing? : A Little 6 Click  Score: 18    End of Session Equipment Utilized During Treatment: Gait belt Activity Tolerance: Treatment limited secondary to medical complications (Comment) Patient left: in chair;with call bell/phone within reach;with chair alarm set Nurse Communication: Mobility status PT Visit Diagnosis: Unsteadiness on feet (R26.81);Difficulty in walking, not elsewhere classified (R26.2)    Time: 9678-9381 (0175-1025) PT Time Calculation (min) (ACUTE ONLY): 21 min   Charges:   PT Evaluation $PT Eval Moderate Complexity: 1 Mod PT Treatments $Gait Training: 8-22 mins $Therapeutic Activity: 8-22 mins       Ramond Dial 01/09/2021, 4:57 PM  Mee Hives, PT MS Acute Rehab Dept. Number: [place]

## 2021-01-09 NOTE — Progress Notes (Addendum)
Progress Note  Patient Name: Brandon [name] Date of Encounter: 01/09/2021  Primary Cardiologist: Follows at the [facility] very SOB this AM. No chest pain. Has been feeling poorly for weeks.  Inpatient Medications    Scheduled Meds: . aspirin  81 mg Oral Daily  . atorvastatin  40 mg Oral QHS  . bisoprolol  5 mg Oral Daily  . clopidogrel  75 mg Oral Daily  . cyanocobalamin  1,000 mcg Subcutaneous Once  . diltiazem  30 mg Oral Q6H  . enoxaparin (LOVENOX) injection  40 mg Subcutaneous Q24H  . ferrous sulfate  325 mg Oral BID WC  . insulin aspart  0-5 Units Subcutaneous QHS  . insulin aspart  0-9 Units Subcutaneous TID WC  . insulin glargine  30 Units Subcutaneous QHS  . ipratropium-albuterol  3 mL Nebulization TID  . mometasone-formoterol  2 puff Inhalation BID  . nortriptyline  50 mg Oral QHS  . oxybutynin  10 mg Oral Daily  . pantoprazole  40 mg Oral Daily  . predniSONE  40 mg Oral Q breakfast  . [START ON 01/10/2021] vitamin B-12  1,000 mcg Oral Daily   Continuous Infusions: . cefTRIAXone (ROCEPHIN)  IV 1 g (01/08/21 2212)   PRN Meds: acetaminophen **OR** acetaminophen, albuterol, bisacodyl, HYDROcodone-acetaminophen, senna-docusate   Vital Signs    Vitals:   01/08/21 2024 01/08/21 2128 01/09/21 0600 01/09/21 0800  BP: 126/87  113/65   Pulse: (!) 106  93 99  Resp: 20  20   Temp: 98 F (36.7 C)  97.8 F (36.6 C)   TempSrc:   Oral   SpO2: 100% 98% 92% 92%  Weight:   79.3 kg   Height:        Intake/Output Summary (Last 24 hours) at 01/09/2021 0820 Last data filed at 01/09/2021 0800 Gross per 24 hour  Intake 628.12 ml  Output 450 ml  Net 178.12 ml   Last 3 Weights 01/09/2021 01/07/2021 10/13/2020  Weight (lbs) 174 lb 12.8 oz 211 lb 186 lb 1.1 oz  Weight (kg) 79.289 kg 95.709 kg 84.4 kg     Telemetry    NSR/sinus tach with occasional bursts of atrial tach - Personally Reviewed  Physical Exam   GEN: Chronically ill appearing WM  HEENT:  Normocephalic, atraumatic, sclera non-icteric. Neck: No JVD or bruits. Cardiac: Reg rhythm borderline tachycardic, no murmurs, rubs, or gallops.  Radials/DP/PT 1+ and equal bilaterally.  Respiratory: Coarse BS throughout bilaterally, scattered faint expiratory wheezes. Increase in WOB on O2 [place] GI: Soft, nontender, non-distended, BS +x 4. MS: no deformity. Extremities: No clubbing or cyanosis. No edema. Distal pedal pulses are 2+ and equal bilaterally. Chronic skin thickening noted Neuro:  AAOx3. Follows commands. Psych:  Responds to questions appropriately with a normal affect.  Labs    High Sensitivity Troponin:   Recent Labs  Lab 01/07/21 1638 01/07/21 2005 01/07/21 2300 01/08/21 1227 01/08/21 2108  TROPONINIHS 49* 39* 33* 343* 1,258*      Cardiac EnzymesNo results for input(s): TROPONINI in the last 168 hours. No results for input(s): TROPIPOC in the last 168 hours.   Chemistry Recent Labs  Lab 01/07/21 1408 01/07/21 2336 01/08/21 0339 01/09/21 0234  NA 131* 134* 134* 135  K 5.0 4.8 4.9 4.2  CL 103  --  106 103  CO2 15*  --  18* 21*  GLUCOSE 150*  --  287* 308*  BUN 18  --  15 20  CREATININE 1.25*  --  1.10 1.04  CALCIUM 9.0  --  8.5* 9.0  PROT 6.5  --  5.7*  --   ALBUMIN 3.4*  --  2.9*  --   AST 20  --  14*  --   ALT 17  --  16  --   ALKPHOS 54  --  47  --   BILITOT 0.4  --  0.7  --   GFRNONAA >60  --  >60 >60  ANIONGAP 13  --  10 11     Hematology Recent Labs  Lab 01/08/21 0339 01/08/21 0755 01/09/21 0234  WBC 5.1 5.7 8.3  RBC 4.35 4.65 4.49  HGB 10.7* 10.9* 11.1*  HCT 34.0* 37.6* 34.4*  MCV 78.2* 80.9 76.6*  MCH 24.6* 23.4* 24.7*  MCHC 31.5 29.0* 32.3  RDW 21.6* 21.9* 21.6*  PLT 342 328 338    BNP Recent Labs  Lab 01/07/21 1408  BNP 337.4*     DDimer  Recent Labs  Lab 01/08/21 2108  DDIMER 0.89*     Radiology    DG Chest Port 1 View  Result Date: 01/07/2021 CLINICAL DATA:  Shortness of breath for 2 days. EXAM: PORTABLE CHEST 1  VIEW COMPARISON:  October 13, 2020 FINDINGS: Increased bilateral interstitial lung markings in the right mid lower lung in the periphery of the left mid lower lung. These findings are not significantly changed since October 13, 2020. A CT scan at that time demonstrated emphysematous changes without infiltrate. No pneumothorax. No nodules or masses. Stable cardiomegaly. The hila and mediastinum are unchanged. IMPRESSION: No definite interval change in the appearance of the chest. Chronic increased interstitial lung markings remain, particularly in the right mid and lower lung, noted to represent emphysematous changes on previous CT imaging. A subtle acute on chronic process may be difficult to identify in the background of these chronic changes. Electronically Signed   By: Dorise Bullion III M.D   On: 01/07/2021 15:58    Cardiac Studies   2D Echo 09/2020  1. Left ventricular ejection fraction, by estimation, is 60 to 65%. The  left ventricle has normal function. The left ventricle has no regional  wall motion abnormalities. There is moderate left ventricular hypertrophy.  Indeterminate diastolic filling due  to E-A fusion.  2. Right ventricular systolic function is normal. The right ventricular  size is mildly enlarged. Tricuspid regurgitation signal is inadequate for  assessing PA pressure.  3. The mitral valve is degenerative. Mild mitral valve regurgitation. No  evidence of mitral stenosis.  4. Prominent LVOT calcifications extending along base of anterior mitral  leaflet.. The aortic valve is abnormal. There is moderate calcification of  the aortic valve. Aortic valve regurgitation is mild to moderate. No  aortic stenosis is present.  5. Aortic dilatation noted. There is mild dilatation of the aortic root,  measuring 40 mm. There is moderate dilatation of the ascending aorta,  measuring 48 mm.  6. The inferior vena cava is dilated in size with >50% respiratory  variability,  suggesting right atrial pressure of 8 mmHg.  7. Cannot exclude small PFO by color flow. Atrial septal aneurysm.   Conclusion(s)/Recommendation(s): Bulky LVOT calcifications may be a  cardiac source of stroke.   Patient Profile     75 y.o. male with reported history of CAD seen on CT, CVA 09/2020, COPD with chronic respiratory failure on home O2 (4L), ETOH abuse, chronic pancreatitis, HTN, GERD, PAD, DM, thoracic aortic aneurysm, hypoaldosteronism, paroxysmal atrial tachycardia who presented to the hospital with  shortness of breath and acute on chronic hypoxic respiratory failure with activity after his home O2 stopped working. Cardiology consulted for elevated troponin.  Assessment & Plan    1. Acute on chronic hypoxic respiratory failure - on 4L O2 at baseline, requiring 10L on presentation in context of being out of oxygen prior to admission - covid negative on arrival - BNP 337 with chronic interstitial markings on CXR - IM treating AECOPD - CT angio this AM showed no PE, + COPD with PAH, enlarging mild-moderate pleural effusions R>L with progression from prior, extensive coronary calficiation - consider trial of Lasix - may need to consider thoracentesis. Of note he has Lasix listed as an allergy but successfully received several doses without adverse effect in 09/2020 admission. Per notes, at that time, patient had reported remote intolerance back in 1984 so will remove from formal allergy list  2. Elevated troponin with notation of CAD on CT - hsTroponin 49->39>33->343->1258->1140, repeat ordered by IM to trend - no prior cath per patient, only noted on imaging incidentally - per consult note, potentially felt to be demand process - await echocardiogram to help guide next steps, likely poor candidate for coronary CT given report of significant calcification at baseline - on ASA, Plavix, statin PTA given history of stroke - was on high dose metoprolol at home -> switched to bisoprolol  this admission - per prelim discussion with MD, would not heparinize at this time given suspicion for demand ischemia rather than true ACS  3. Paroxysmal atrial tachycardia - occasional bursts on telemetry - continue bisoprolol, diltiazem q6hr - add MagOx to keep Mg 2.0 or greater - potassium wnl  4. H/o CVA 09/2020 -on ASA, Plavix, statin PTA for this - per neuro note 10/15/20 the plan was for aspirin, Pletal and Plavix for 3 weeks then after 3 weeks, Plavix + Pletal although home med list has ASA +Plavix - 30 day monitor planned at that time but do not see this was ever sent - 2D Echo in 09/2020 stated: "Bulky LVOT calcifications may be a  cardiac source of stroke." - also + Atrial septal aneurysm and could not exclude PFO - await relook echo - may need to revisit event monitoring as OP  5. Essential HTN - BP controlled  6. Thoracic aortic aneurysm - 4.8cm on CT 09/2020 - stable at 4.7cm by today's CT - unclear if [facility] this but will need OP f/u for this  7. Microcytic anemia - per IM  For questions or updates, please contact [place] Please consult [email or web address] for contact info under Cardiology/STEMI.  Signed, Charlie Pitter, PA-C 01/09/2021, 8:20 AM     Attending Note:   The patient was seen and examined.  Agree with assessment and plan as noted above.  Changes made to the above note as needed.  Patient seen and independently examined with Melina Copa, PA .   We discussed all aspects of the encounter. I agree with the assessment and plan as stated above.  1.   NSTEMI:    Elevated troponins ,  Evidence of coronary calcifications I think he needs a R and L heart cath - likely Wednesday   2. Pulmonary HTN:   Will try lasix .   He has tolerated in the past ( although he had it listed as an allergy since 1982)   3.  COPD :  Has severe COPD:   Further plans per IM team   4.  HTN:   BP  is controlled.     I have spent a total of 40 minutes with patient  reviewing hospital  notes , telemetry, EKGs, labs and examining patient as well as establishing an assessment and plan that was discussed with the patient. > 50% of time was spent in direct patient care.    Thayer Headings, Brooke Bonito., MD, [facility] 01/09/2021, 11:01 AM [address]  [place] Pager [phone]

## 2021-01-10 DIAGNOSIS — J9621 Acute and chronic respiratory failure with hypoxia: Secondary | ICD-10-CM | POA: Diagnosis not present

## 2021-01-10 DIAGNOSIS — J441 Chronic obstructive pulmonary disease with (acute) exacerbation: Secondary | ICD-10-CM | POA: Diagnosis not present

## 2021-01-10 DIAGNOSIS — I2583 Coronary atherosclerosis due to lipid rich plaque: Secondary | ICD-10-CM | POA: Diagnosis not present

## 2021-01-10 DIAGNOSIS — I2510 Atherosclerotic heart disease of native coronary artery without angina pectoris: Secondary | ICD-10-CM | POA: Diagnosis not present

## 2021-01-10 DIAGNOSIS — R778 Other specified abnormalities of plasma proteins: Secondary | ICD-10-CM | POA: Diagnosis not present

## 2021-01-10 LAB — BASIC METABOLIC PANEL
Anion gap: 13 (ref 5–15)
BUN: 26 mg/dL — ABNORMAL HIGH (ref 8–23)
CO2: 23 mmol/L (ref 22–32)
Calcium: 9 mg/dL (ref 8.9–10.3)
Chloride: 98 mmol/L (ref 98–111)
Creatinine, Ser: 1.21 mg/dL (ref 0.61–1.24)
GFR, Estimated: >60 mL/min (ref >60)
Glucose, Bld: 308 mg/dL — ABNORMAL HIGH (ref 70–99)
Potassium: 3.9 mmol/L (ref 3.5–5.1)
Sodium: 134 mmol/L — ABNORMAL LOW (ref 135–145)

## 2021-01-10 LAB — CBC WITH DIFFERENTIAL/PLATELET
Abs Immature Granulocytes: 0.06 10*3/uL (ref 0.00–0.07)
Basophils Absolute: 0 10*3/uL (ref 0.0–0.1)
Basophils Relative: 0 %
Eosinophils Absolute: 0 10*3/uL (ref 0.0–0.5)
Eosinophils Relative: 0 %
HCT: 35.1 % — ABNORMAL LOW (ref 39.0–52.0)
Hemoglobin: 11.2 g/dL — ABNORMAL LOW (ref 13.0–17.0)
Immature Granulocytes: 1 %
Lymphocytes Relative: 4 %
Lymphs Abs: 0.5 10*3/uL — ABNORMAL LOW (ref 0.7–4.0)
MCH: 24.5 pg — ABNORMAL LOW (ref 26.0–34.0)
MCHC: 31.9 g/dL (ref 30.0–36.0)
MCV: 76.6 fL — ABNORMAL LOW (ref 80.0–100.0)
Monocytes Absolute: 1.1 10*3/uL — ABNORMAL HIGH (ref 0.1–1.0)
Monocytes Relative: 9 %
Neutro Abs: 10.5 10*3/uL — ABNORMAL HIGH (ref 1.7–7.7)
Neutrophils Relative %: 86 %
Platelets: 359 10*3/uL (ref 150–400)
RBC: 4.58 MIL/uL (ref 4.22–5.81)
RDW: 21.5 % — ABNORMAL HIGH (ref 11.5–15.5)
WBC: 12.2 10*3/uL — ABNORMAL HIGH (ref 4.0–10.5)
nRBC: 0.2 % (ref 0.0–0.2)

## 2021-01-10 LAB — GLUCOSE, CAPILLARY
Glucose-Capillary: 213 mg/dL — ABNORMAL HIGH (ref 70–99)
Glucose-Capillary: 221 mg/dL — ABNORMAL HIGH (ref 70–99)
Glucose-Capillary: 203 mg/dL — ABNORMAL HIGH (ref 70–99)
Glucose-Capillary: 226 mg/dL — ABNORMAL HIGH (ref 70–99)

## 2021-01-10 LAB — BRAIN NATRIURETIC PEPTIDE: B Natriuretic Peptide: 346 pg/mL — ABNORMAL HIGH (ref 0.0–100.0)

## 2021-01-10 MED ORDER — INSULIN GLARGINE 100 UNIT/ML ~~LOC~~ SOLN
15.0000 [IU] | Freq: Once | SUBCUTANEOUS | Status: AC
  Administered 2021-01-10: 15 [IU] via SUBCUTANEOUS
  Filled 2021-01-10: qty 0.15

## 2021-01-10 MED ORDER — SODIUM CHLORIDE 0.9 % IV SOLN: INTRAVENOUS | Status: DC

## 2021-01-10 MED ORDER — SODIUM CHLORIDE 0.9 % IV SOLN: 250.0000 mL | INTRAVENOUS | Status: DC | PRN

## 2021-01-10 MED ORDER — SODIUM CHLORIDE 0.9% FLUSH: 3.0000 mL | INTRAVENOUS | Status: DC | PRN

## 2021-01-10 MED ORDER — ASPIRIN 81 MG PO CHEW
81.0000 mg | CHEWABLE_TABLET | ORAL | Status: AC
  Administered 2021-01-11: 81 mg via ORAL
  Filled 2021-01-10: qty 1

## 2021-01-10 MED ORDER — INSULIN GLARGINE 100 UNIT/ML ~~LOC~~ SOLN
25.0000 [IU] | Freq: Every day | SUBCUTANEOUS | Status: DC
  Administered 2021-01-11 – 2021-01-14 (×3): 25 [IU] via SUBCUTANEOUS
  Filled 2021-01-10 (×4): qty 0.25

## 2021-01-10 MED ORDER — SODIUM CHLORIDE 0.9% FLUSH
3.0000 mL | Freq: Two times a day (BID) | INTRAVENOUS | Status: DC
  Administered 2021-01-10 – 2021-01-12 (×4): 3 mL via INTRAVENOUS

## 2021-01-10 MED ORDER — INSULIN ASPART 100 UNIT/ML ~~LOC~~ SOLN
8.0000 [IU] | Freq: Three times a day (TID) | SUBCUTANEOUS | Status: DC
  Administered 2021-01-10 – 2021-01-12 (×7): 8 [IU] via SUBCUTANEOUS

## 2021-01-10 MED ORDER — INSULIN GLARGINE 100 UNIT/ML ~~LOC~~ SOLN
25.0000 [IU] | Freq: Two times a day (BID) | SUBCUTANEOUS | Status: DC
  Administered 2021-01-10: 25 [IU] via SUBCUTANEOUS
  Filled 2021-01-10 (×2): qty 0.25

## 2021-01-10 MED ORDER — DILTIAZEM HCL 60 MG PO TABS
30.0000 mg | ORAL_TABLET | Freq: Once | ORAL | Status: AC
  Administered 2021-01-10: 30 mg via ORAL
  Filled 2021-01-10: qty 1

## 2021-01-10 MED ORDER — POTASSIUM CHLORIDE CRYS ER 20 MEQ PO TBCR
20.0000 meq | EXTENDED_RELEASE_TABLET | Freq: Two times a day (BID) | ORAL | Status: AC
  Administered 2021-01-10 (×2): 20 meq via ORAL
  Filled 2021-01-10 (×2): qty 1

## 2021-01-10 MED ORDER — DILTIAZEM HCL 60 MG PO TABS
60.0000 mg | ORAL_TABLET | Freq: Four times a day (QID) | ORAL | Status: DC
  Administered 2021-01-10 – 2021-01-14 (×15): 60 mg via ORAL
  Filled 2021-01-10 (×15): qty 1

## 2021-01-10 NOTE — Plan of Care (Signed)
  Problem: Education: Goal: Knowledge of General Education information will improve Description: Including pain rating scale, medication(s)/side effects and non-pharmacologic comfort measures Outcome: Progressing   Problem: Nutrition: Goal: Adequate nutrition will be maintained Outcome: Progressing   Problem: Coping: Goal: Level of anxiety will decrease Outcome: Progressing   Problem: Elimination: Goal: Will not experience complications related to bowel motility Outcome: Progressing Goal: Will not experience complications related to urinary retention Outcome: Progressing   Problem: Pain Managment: Goal: General experience of comfort will improve Outcome: Progressing   Problem: Safety: Goal: Ability to remain free from injury will improve Outcome: Progressing   

## 2021-01-10 NOTE — Progress Notes (Signed)
PROGRESS NOTE  Brandon [name] GUR:[id number] DOB: [birth year] DOA: 01/07/2021 PCP: Pcp, No  HPI/Recap of past 24 hours: HPI from Dr Seward Carol is a 75 y.o. 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 Not vaccinated for COVID he would like to but had trouble with transportation. Denies any chest pain, reports chronic abd distention since his prostate surgery as per pt. in the ED, patient was noted to be requiring about 10 L of oxygen on presentation, chest x-ray showed emphysematous changes, Covid and flu negative.  Patient admitted for further management.    Today, patient denies any worsening shortness of breath, denies any chest pain, abdominal pain, nausea/vomiting, fever/chills.   Assessment/Plan: Active Problems:   Essential hypertension   Type 2 diabetes mellitus (HCC)   CAD (coronary artery disease)   GERD (gastroesophageal reflux disease)   Acute on chronic respiratory failure with hypoxemia (HCC)   Tobacco abuse   Hyponatremia   Elevated troponin   COPD with acute exacerbation (HCC)   History of CVA (cerebrovascular accident)   COPD exacerbation (HCC)   Hypomagnesemia   Pressure injury of skin   Malnutrition of moderate degree   Acute on chronic hypoxic respiratory failure Possibly COPD with acute exacerbation On 4 L of O2 at baseline, required about 10L on presentation Currently afebrile, with leukocytosis (steroids) Chest x-ray with evidence of emphysematous changes D-dimer elevated, CTA chest with no evidence of PE Covid, flu panel negative Continue duo nebs, inhalers, steroids, Mucinex Continue ceftriaxone for total of 5 days Continue supplemental oxygen, BiPAP as needed Monitor  closely  NSTEMI/severe pulmonary hypertension Elevated troponin EKG with no acute ST changes Echo showed EF of 60 to 65%, no regional wall motion abnormality, noted PAH pressure 64.6 mmHg Cardiology on board, plan for cardiac cath on 01/11/2021 Continue Lasix, Cardizem (as rec by cardiology) Telemetry  Diabetes mellitus type 2 Last A1c 6.4 on 01/07/2021, poor control, currently 2/2 steroid use SSI, Lantus, Accu-Cheks, hypoglycemic protocol Diabetes coordinator, appreciate recs  Hypomagnesemia Replace as needed  Microcytic anemia/vitamin B12 deficiency Hemoglobin somewhat around baseline Anemia panel showed iron 14, sats 4, B12 141 Oral iron and vitamin B12 supplementation Daily CBC  Hypertension BP stable Continue Cardizem  History of CAD/CVA Currently chest pain-free Continue aspirin, statins, Plavix, switched to bisoprolol due to COPD exacerbation  GERD Continue pantoprazole      Estimated body mass index is 20.15 kg/m as calculated from the following:   Height as of this encounter: 6\' 6"  (1.981 m).   Weight as of this encounter: 79.1 kg.     Code Status: Full  Family Communication: None at bedside  Disposition Plan: Status is: Inpatient  Remains inpatient appropriate because:Inpatient level of care appropriate due to severity of illness   Dispo: The patient is from: Home              Anticipated d/c is to: Home              Anticipated d/c date is: 3 days              Patient currently is not medically stable to d/c.    Consultants:  Cardiology  Procedures:  None  Antimicrobials:  Ceftriaxone  DVT  prophylaxis: Lovenox   Objective: Vitals:   01/09/21 2221 01/09/21 2327 01/10/21 0655 01/10/21 0947  BP: 130/87 134/89 132/84 133/79  Pulse: (!) 108 (!) 109 94 (!) 106  Resp: 20 20  20   Temp: 98 F (36.7 C) 98.3 F (36.8 C) 98 F (36.7 C)   TempSrc: Oral Oral Oral   SpO2: 94% 96%  94%  Weight:   79.1 kg   Height:         Intake/Output Summary (Last 24 hours) at 01/10/2021 1156 Last data filed at 01/10/2021 0900 Gross per 24 hour  Intake 725.31 ml  Output 2350 ml  Net -1624.69 ml   Filed Weights   01/07/21 1411 01/09/21 0600 01/10/21 0655  Weight: 95.7 kg 79.3 kg 79.1 kg    Exam:  General: NAD, deconditioned, chronically ill-appearing  Cardiovascular: S1, S2 present  Respiratory:  Diminished breath sounds bilaterally  Abdomen: Soft, nontender, distended, bowel sounds present  Musculoskeletal: Trace bilateral pedal edema noted, with chronic venous stasis as well as dryness/flaking noted on BLE  Skin:  As noted above  Psychiatry: Normal mood   Data Reviewed: CBC: Recent Labs  Lab 01/07/21 1408 01/07/21 2336 01/08/21 0339 01/08/21 0755 01/09/21 0234 01/10/21 0304  WBC 9.1  --  5.1 5.7 8.3 12.2*  NEUTROABS 7.4  --  4.4 4.9 7.5 10.5*  HGB 11.9* 12.9* 10.7* 10.9* 11.1* 11.2*  HCT 38.3* 38.0* 34.0* 37.6* 34.4* 35.1*  MCV 79.5*  --  78.2* 80.9 76.6* 76.6*  PLT 341  --  342 328 338 [id number]   Basic Metabolic Panel: Recent Labs  Lab 01/07/21 1408 01/07/21 1638 01/07/21 2336 01/08/21 0339 01/09/21 0234 01/10/21 0304  NA 131*  --  134* 134* 135 134*  K 5.0  --  4.8 4.9 4.2 3.9  CL 103  --   --  106 103 98  CO2 15*  --   --  18* 21* 23  GLUCOSE 150*  --   --  287* 308* 308*  BUN 18  --   --  15 20 26*  CREATININE 1.25*  --   --  1.10 1.04 1.21  CALCIUM 9.0  --   --  8.5* 9.0 9.0  MG  --  1.5*  --  1.6* 1.9  --   PHOS  --   --   --  4.5  --   --    GFR: Estimated Creatinine Clearance: 59 mL/min (by C-G formula based on SCr of 1.21 mg/dL). Liver Function Tests: Recent Labs  Lab 01/07/21 1408 01/08/21 0339  AST 20 14*  ALT 17 16  ALKPHOS 54 47  BILITOT 0.4 0.7  PROT 6.5 5.7*  ALBUMIN 3.4* 2.9*   No results for input(s): LIPASE, AMYLASE in the last 168 hours. No results for input(s): AMMONIA in the last 168 hours. Coagulation Profile: No results for input(s): INR,  PROTIME in the last 168 hours. Cardiac Enzymes: No results for input(s): CKTOTAL, CKMB, CKMBINDEX, TROPONINI in the last 168 hours. BNP (last 3 results) No results for input(s): PROBNP in the last 8760 hours. HbA1C: Recent Labs    01/07/21 2006  HGBA1C 6.4*   CBG: Recent Labs  Lab 01/09/21 1125 01/09/21 1703 01/09/21 2210 01/10/21 0819 01/10/21 1130  GLUCAP 264* 275* 325* 213* 221*   Lipid Profile: No results for input(s): CHOL, HDL, LDLCALC, TRIG, CHOLHDL, LDLDIRECT in the last 72 hours. Thyroid Function Tests: Recent Labs    01/08/21 0339  TSH 0.533   Anemia Panel:  Recent Labs    01/09/21 0234  VITAMINB12 141*  FOLATE 7.3  FERRITIN 30  TIBC 358  IRON 14*   Urine analysis:    Component Value Date/Time   COLORURINE YELLOW 01/07/2021 2010   APPEARANCEUR CLEAR 01/07/2021 2010   LABSPEC 1.006 01/07/2021 2010   PHURINE 5.0 01/07/2021 2010   GLUCOSEU NEGATIVE 01/07/2021 2010   HGBUR NEGATIVE 01/07/2021 2010   [place] NEGATIVE 01/07/2021 2010   [place] NEGATIVE 01/07/2021 2010   PROTEINUR NEGATIVE 01/07/2021 2010   NITRITE NEGATIVE 01/07/2021 2010   LEUKOCYTESUR NEGATIVE 01/07/2021 2010   Sepsis Labs: @LABRCNTIP (procalcitonin:4,lacticidven:4)  ) Recent Results (from the past 240 hour(s))  Resp Panel by RT-PCR (Flu A&B, Covid) Nasopharyngeal Swab     Status: None   Collection Time: 01/07/21  3:41 PM   Specimen: Nasopharyngeal Swab; Nasopharyngeal(NP) swabs in vial transport medium  Result Value Ref Range Status   SARS Coronavirus 2 by RT PCR NEGATIVE NEGATIVE Final    Comment: (NOTE) SARS-CoV-2 target nucleic acids are NOT DETECTED.  The SARS-CoV-2 RNA is generally detectable in upper respiratory specimens during the acute phase of infection. The lowest concentration of SARS-CoV-2 viral copies this assay can detect is 138 copies/mL. A negative result does not preclude SARS-Cov-2 infection and should not be used as the sole basis for treatment  or other patient management decisions. A negative result may occur with  improper specimen collection/handling, submission of specimen other than nasopharyngeal swab, presence of viral mutation(s) within the areas targeted by this assay, and inadequate number of viral copies(<138 copies/mL). A negative result must be combined with clinical observations, patient history, and epidemiological information. The expected result is Negative.  Fact Sheet for Patients:  EntrepreneurPulse.com.au  Fact Sheet for Healthcare Providers:  IncredibleEmployment.be  This test is no t yet approved or cleared by the [place] FDA and  has been authorized for detection and/or diagnosis of SARS-CoV-2 by FDA under an Emergency Use Authorization (EUA). This EUA will remain  in effect (meaning this test can be used) for the duration of the COVID-19 declaration under Section 564(b)(1) of the Act, 21 U.S.C.section 360bbb-3(b)(1), unless the authorization is terminated  or revoked sooner.       Influenza A by PCR NEGATIVE NEGATIVE Final   Influenza B by PCR NEGATIVE NEGATIVE Final    Comment: (NOTE) The Xpert Xpress SARS-CoV-2/FLU/RSV plus assay is intended as an aid in the diagnosis of influenza from Nasopharyngeal swab specimens and should not be used as a sole basis for treatment. Nasal washings and aspirates are unacceptable for Xpert Xpress SARS-CoV-2/FLU/RSV testing.  Fact Sheet for Patients: EntrepreneurPulse.com.au  Fact Sheet for Healthcare Providers: IncredibleEmployment.be  This test is not yet approved or cleared by the [place] FDA and has been authorized for detection and/or diagnosis of SARS-CoV-2 by FDA under an Emergency Use Authorization (EUA). This EUA will remain in effect (meaning this test can be used) for the duration of the COVID-19 declaration under Section 564(b)(1) of the Act, 21 U.S.C. section  360bbb-3(b)(1), unless the authorization is terminated or revoked.  Performed at [facility], [place] [address], [place], [place] [address]   MRSA PCR Screening     Status: None   Collection Time: 01/09/21 12:17 AM   Specimen: Nasal Mucosa; Nasopharyngeal  Result Value Ref Range Status   MRSA by PCR NEGATIVE NEGATIVE Final    Comment:        The GeneXpert MRSA Assay (FDA approved for NASAL specimens only), is one component  of a comprehensive MRSA colonization surveillance program. It is not intended to diagnose MRSA infection nor to guide or monitor treatment for MRSA infections. Performed at [facility], [place] [address] [place], [place] [address]       Studies: No results found.  Scheduled Meds: . aspirin  81 mg Oral Daily  . atorvastatin  40 mg Oral QHS  . bisoprolol  5 mg Oral Daily  . clopidogrel  75 mg Oral Daily  . diltiazem  60 mg Oral Q6H  . enoxaparin (LOVENOX) injection  40 mg Subcutaneous Q24H  . feeding supplement (GLUCERNA SHAKE)  237 mL Oral BID BM  . ferrous sulfate  325 mg Oral BID WC  . furosemide  40 mg Intravenous BID  . insulin aspart  0-5 Units Subcutaneous QHS  . insulin aspart  0-9 Units Subcutaneous TID WC  . insulin aspart  8 Units Subcutaneous TID WC  . insulin glargine  25 Units Subcutaneous BID  . ipratropium-albuterol  3 mL Nebulization TID  . magnesium oxide  400 mg Oral Daily  . mometasone-formoterol  2 puff Inhalation BID  . multivitamin with minerals  1 tablet Oral Daily  . nortriptyline  50 mg Oral QHS  . oxybutynin  10 mg Oral Daily  . pantoprazole  40 mg Oral Daily  . potassium chloride  20 mEq Oral BID  . predniSONE  40 mg Oral Q breakfast  . Ensure Max Protein  11 oz Oral QHS  . sodium chloride flush  3 mL Intravenous Q12H  . vitamin B-12  1,000 mcg Oral Daily    Continuous Infusions: . cefTRIAXone (ROCEPHIN)  IV 1 g (01/09/21 2344)     LOS: 3 days     Alma Friendly, MD [facility]  If  7PM-7AM, please contact night-coverage [email or web address] 01/10/2021, 11:56 AM

## 2021-01-10 NOTE — H&P (View-Only) (Signed)
Progress Note  Patient Name: Brandon [name] Date of Encounter: 01/10/2021  Primary Cardiologist: Follows at the [facility]  Subjective   Denies any SOB - appears more comfortable today on O2. Reports excellent UOP since starting Lasix. He is unaware of the frequent bursts observed on telemetry.  Inpatient Medications    Scheduled Meds: . aspirin  81 mg Oral Daily  . atorvastatin  40 mg Oral QHS  . bisoprolol  5 mg Oral Daily  . clopidogrel  75 mg Oral Daily  . diltiazem  30 mg Oral Q6H  . enoxaparin (LOVENOX) injection  40 mg Subcutaneous Q24H  . feeding supplement (GLUCERNA SHAKE)  237 mL Oral BID BM  . ferrous sulfate  325 mg Oral BID WC  . furosemide  40 mg Intravenous BID  . insulin aspart  0-5 Units Subcutaneous QHS  . insulin aspart  0-9 Units Subcutaneous TID WC  . insulin aspart  8 Units Subcutaneous TID WC  . insulin glargine  25 Units Subcutaneous BID  . ipratropium-albuterol  3 mL Nebulization TID  . magnesium oxide  400 mg Oral Daily  . mometasone-formoterol  2 puff Inhalation BID  . multivitamin with minerals  1 tablet Oral Daily  . nortriptyline  50 mg Oral QHS  . oxybutynin  10 mg Oral Daily  . pantoprazole  40 mg Oral Daily  . predniSONE  40 mg Oral Q breakfast  . Ensure Max Protein  11 oz Oral QHS  . vitamin B-12  1,000 mcg Oral Daily   Continuous Infusions: . cefTRIAXone (ROCEPHIN)  IV 1 g (01/09/21 2344)   PRN Meds: acetaminophen **OR** acetaminophen, albuterol, bisacodyl, HYDROcodone-acetaminophen, senna-docusate   Vital Signs    Vitals:   01/09/21 2035 01/09/21 2221 01/09/21 2327 01/10/21 0655  BP:  130/87 134/89 132/84  Pulse:  (!) 108 (!) 109 94  Resp:  20 20   Temp:  98 F (36.7 C) 98.3 F (36.8 C) 98 F (36.7 C)  TempSrc:  Oral Oral Oral  SpO2: 94% 94% 96%   Weight:    79.1 kg  Height:        Intake/Output Summary (Last 24 hours) at 01/10/2021 0825 Last data filed at 01/10/2021 0435 Gross per 24 hour  Intake 485.31 ml  Output 2350 ml   Net -1864.69 ml   Last 3 Weights 01/10/2021 01/09/2021 01/07/2021  Weight (lbs) 174 lb 6.1 oz 174 lb 12.8 oz 211 lb  Weight (kg) 79.1 kg 79.289 kg 95.709 kg     Telemetry    NSR/sinus tach with frequent bursts of brief PAT - Personally Reviewed  Physical Exam   GEN: Chronically ill appearing WM in no acute distress on [place] - appears more chipper, less SOB this AM HEENT: Normocephalic, atraumatic, sclera non-icteric. Neck: No JVD or bruits. Cardiac: Irregular due to NSR interspersed with frequent bursts of atrial tach, no murmurs, rubs, or gallops.  Radials/DP/PT 1+ and equal bilaterally.  Respiratory: Diminished at bases bilaterally. Less wheezing today. Breathing is unlabored. GI: Soft, nontender, non-distended, BS +x 4. MS: no deformity. Extremities: No clubbing or cyanosis. Correction to prior note: chronic venous stasis changes noted with chronic skin thickening and mild edema Neuro:  AAOx3. Follows commands. Psych:  Responds to questions appropriately with a normal affect.  Labs    High Sensitivity Troponin:   Recent Labs  Lab 01/07/21 2300 01/08/21 1227 01/08/21 2108 01/09/21 0816 01/09/21 1017  TROPONINIHS 33* 343* 1,258* 1,140* 982*      Cardiac EnzymesNo results  for input(s): TROPONINI in the last 168 hours. No results for input(s): TROPIPOC in the last 168 hours.   Chemistry Recent Labs  Lab 01/07/21 1408 01/07/21 2336 01/08/21 0339 01/09/21 0234 01/10/21 0304  NA 131*   < > 134* 135 134*  K 5.0   < > 4.9 4.2 3.9  CL 103  --  106 103 98  CO2 15*  --  18* 21* 23  GLUCOSE 150*  --  287* 308* 308*  BUN 18  --  15 20 26*  CREATININE 1.25*  --  1.10 1.04 1.21  CALCIUM 9.0  --  8.5* 9.0 9.0  PROT 6.5  --  5.7*  --   --   ALBUMIN 3.4*  --  2.9*  --   --   AST 20  --  14*  --   --   ALT 17  --  16  --   --   ALKPHOS 54  --  47  --   --   BILITOT 0.4  --  0.7  --   --   GFRNONAA >60  --  >60 >60 >60  ANIONGAP 13  --  10 11 13    < > = values in this  interval not displayed.     Hematology Recent Labs  Lab 01/08/21 0755 01/09/21 0234 01/10/21 0304  WBC 5.7 8.3 12.2*  RBC 4.65 4.49 4.58  HGB 10.9* 11.1* 11.2*  HCT 37.6* 34.4* 35.1*  MCV 80.9 76.6* 76.6*  MCH 23.4* 24.7* 24.5*  MCHC 29.0* 32.3 31.9  RDW 21.9* 21.6* 21.5*  PLT 328 338 359    BNP Recent Labs  Lab 01/07/21 1408 01/10/21 0304  BNP 337.4* 346.0*     DDimer  Recent Labs  Lab 01/08/21 2108  DDIMER 0.89*     Radiology    CT ANGIO CHEST PE W OR WO CONTRAST  Result Date: 01/09/2021 CLINICAL DATA:  COPD, short of breath. Prostate cancer. Rule out pulmonary embolism. EXAM: CT ANGIOGRAPHY CHEST WITH CONTRAST TECHNIQUE: Multidetector CT imaging of the chest was performed using the standard protocol during bolus administration of intravenous contrast. Multiplanar CT image reconstructions and MIPs were obtained to evaluate the vascular anatomy. CONTRAST:  3mL OMNIPAQUE IOHEXOL 350 MG/ML SOLN COMPARISON:  CT chest 10/13/2020 FINDINGS: Cardiovascular: Negative for pulmonary embolism. Pulmonary arteries diffusely enlarged compatible with pulmonary artery hypertension from COPD. Main pulmonary artery 3.8 cm in diameter. Atherosclerotic calcification aortic arch. Ascending aorta dilated at 4.7 cm unchanged from the prior study. No dissection. Extensive coronary artery calcification involving right and left coronary arteries. Heart size upper normal. No pericardial effusion. Mediastinum/Nodes: Negative for mass or adenopathy. Lungs/Pleura: Mild to moderate bilateral pleural effusions right greater than left with progression from the prior study. Mild dependent atelectasis in both lung bases. Underlying COPD with emphysema most notably in the apices. No acute infiltrate or mass in the lungs. Upper Abdomen: Liver cyst 6 x 4 cm unchanged from the prior study. Atherosclerotic calcification abdominal aorta and renal arteries. Musculoskeletal: Multilevel thoracic disc degeneration and  spurring. Sclerotic findings in the vertebral bodies appear degenerative. Negative for fracture. Review of the MIP images confirms the above findings. IMPRESSION: 1. Negative for pulmonary embolism 2. COPD with pulmonary artery hypertension 3. Enlarging bilateral pleural effusions right greater than left. 4. Aortic aneurysm NOS (ICD10-I71.9). 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 5. Extensive coronary calcification. Aortic Atherosclerosis (ICD10-I70.0) and Emphysema (ICD10-J43.9). Electronically Signed   By: Franchot Gallo M.D.   On:  01/09/2021 09:13   ECHOCARDIOGRAM COMPLETE  Result Date: 01/09/2021    ECHOCARDIOGRAM REPORT   Patient Name:   KNIXON MONTALBANO Date of Exam: 01/09/2021 Medical Rec #:  [id number]     Height:       78.0 in Accession #:    [id number]    Weight:       174.8 lb Date of Birth:  March 24, 1946     BSA:          2.132 m Patient Age:    48 years      BP:           113/65 mmHg Patient Gender: M             HR:           93 bpm. Exam Location:  Inpatient Procedure: 2D Echo Indications:    elevated troponin  History:        Patient has prior history of Echocardiogram examinations, most                 recent 10/14/2020. COPD; Risk Factors:Diabetes.  Sonographer:    Johny Chess Referring Phys: [place]  1. Left ventricular ejection fraction, by estimation, is 60 to 65%. The left ventricle has normal function. The left ventricle has no regional wall motion abnormalities. There is mild concentric left ventricular hypertrophy and moderate basal septal hypertrophy.  2. Right ventricular systolic function is moderately reduced. The right ventricular size is moderately enlarged. There is severely elevated pulmonary artery systolic pressure. The estimated right ventricular systolic pressure is Q000111Q mmHg.  3. The mitral valve is degenerative. No evidence of mitral valve regurgitation. No evidence of mitral stenosis.  4. The inferior vena cava is dilated in size with  >50% respiratory variability, suggesting right atrial pressure of 8 mmHg.  5. The aortic valve is tricuspid. There is moderate calcification of the aortic valve primarily of the left coronary cusp. There is moderate thickening of the aortic valve. Aortic valve regurgitation is mild. Mild to moderate aortic valve sclerosis/calcification is present, without any evidence of aortic stenosis.  6. There is mild dilatation at the level of the sinuses of Valsalva, measuring 37 mm. There is mild dilatation of the ascending aorta, measuring 42 mm.  7. Tricuspid valve regurgitation is mild to moderate.  8. Right atrial size was moderately dilated. FINDINGS  Left Ventricle: Left ventricular ejection fraction, by estimation, is 65 to 70%. The left ventricle has normal function. The left ventricle has no regional wall motion abnormalities. The left ventricular internal cavity size was normal in size. There is  mild concentric left ventricular hypertrophy. Left ventricular diastolic function could not be evaluated. Right Ventricle: The right ventricular size is moderately enlarged. No increase in right ventricular wall thickness. Right ventricular systolic function is moderately reduced. There is severely elevated pulmonary artery systolic pressure. The tricuspid regurgitant velocity is 3.76 m/s, and with an assumed right atrial pressure of 8 mmHg, the estimated right ventricular systolic pressure is Q000111Q mmHg. Left Atrium: Left atrial size was normal in size. Right Atrium: Right atrial size was moderately dilated. Pericardium: There is no evidence of pericardial effusion. Mitral Valve: The mitral valve is degenerative in appearance. There is mild thickening of the mitral valve leaflet(s). There is mild calcification of the mitral valve leaflet(s). Mild to moderate mitral annular calcification. No evidence of mitral valve regurgitation. No evidence of mitral valve stenosis. Tricuspid Valve: The tricuspid valve is normal in  structure. Tricuspid valve  regurgitation is mild to moderate. No evidence of tricuspid stenosis. Aortic Valve: The aortic valve is tricuspid. There is moderate calcification of the aortic valve. There is moderate thickening of the aortic valve. Aortic valve regurgitation is mild. Mild to moderate aortic valve sclerosis/calcification is present, without any evidence of aortic stenosis. Pulmonic Valve: The pulmonic valve was normal in structure. Pulmonic valve regurgitation is trivial. No evidence of pulmonic stenosis. Aorta: The aortic root is normal in size and structure. There is mild dilatation at the level of the sinuses of Valsalva, measuring 37 mm. There is mild dilatation of the ascending aorta, measuring 42 mm. Venous: The inferior vena cava is dilated in size with greater than 50% respiratory variability, suggesting right atrial pressure of 8 mmHg. IAS/Shunts: No atrial level shunt detected by color flow Doppler.  LEFT VENTRICLE PLAX 2D LVIDd:         4.10 cm LVIDs:         2.60 cm LV PW:         1.20 cm LV IVS:        1.30 cm LVOT diam:     2.30 cm LV SV:         88 LV SV Index:   41 LVOT Area:     4.15 cm  RIGHT VENTRICLE             IVC RV S prime:     13.60 cm/s  IVC diam: 2.50 cm TAPSE (M-mode): 1.6 cm LEFT ATRIUM             Index       RIGHT ATRIUM           Index LA diam:        4.20 cm 1.97 cm/m  RA Area:     19.50 cm LA Vol (A2C):   50.5 ml 23.69 ml/m RA Volume:   55.30 ml  25.94 ml/m LA Vol (A4C):   71.4 ml 33.49 ml/m LA Biplane Vol: 64.9 ml 30.44 ml/m  AORTIC VALVE LVOT Vmax:   96.00 cm/s LVOT Vmean:  66.400 cm/s LVOT VTI:    0.212 m  AORTA Ao Root diam: 3.70 cm Ao Asc diam:  4.20 cm TRICUSPID VALVE TR Peak grad:   56.6 mmHg TR Vmax:        376.00 cm/s  SHUNTS Systemic VTI:  0.21 m Systemic Diam: 2.30 cm Fransico Him MD Electronically signed by Fransico Him MD Signature Date/Time: 01/09/2021/10:24:02 AM    Final     Cardiac Studies   2D Echo 12/2020 1. Left ventricular ejection  fraction, by estimation, is 60 to 65%. The  left ventricle has normal function. The left ventricle has no regional  wall motion abnormalities. There is mild concentric left ventricular  hypertrophy and moderate basal septal  hypertrophy.  2. Right ventricular systolic function is moderately reduced. The right  ventricular size is moderately enlarged. There is severely elevated  pulmonary artery systolic pressure. The estimated right ventricular  systolic pressure is Q000111Q mmHg.  3. The mitral valve is degenerative. No evidence of mitral valve  regurgitation. No evidence of mitral stenosis.  4. The inferior vena cava is dilated in size with >50% respiratory  variability, suggesting right atrial pressure of 8 mmHg.  5. The aortic valve is tricuspid. There is moderate calcification of the  aortic valve primarily of the left coronary cusp. There is moderate  thickening of the aortic valve. Aortic valve regurgitation is mild. Mild  to moderate aortic  valve  sclerosis/calcification is present, without any evidence of aortic  stenosis.  6. There is mild dilatation at the level of the sinuses of Valsalva,  measuring 37 mm. There is mild dilatation of the ascending aorta,  measuring 42 mm.  7. Tricuspid valve regurgitation is mild to moderate.  8. Right atrial size was moderately dilated.   Patient Profile     75 y.o. 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 Cardiology consulted for elevated troponin.  Assessment & Plan    1. Acute on chronic hypoxic respiratory failure - on 4L O2 at baseline, requiring 10L on presentation in context of  being out of oxygen prior to admission - Covid negative on arrival - IM treating AECOPD - CT angio 01/10/20 showed no PE, + COPD with PAH, enlarging mild-moderate pleural effusions - 2D echo 01/10/20 with normal EF, moderately reduced RV function, severe pulmonary HTN, mild AI, mild-moderate TR, mild dilation of ascending aorta which will need to be followed as OP - started on IV Lasix yesterday with good UOP (-2.5L output yesterday) - continue diuresis for now but continue to follow renal function - I tentatively added low dose potassium with diuresis so if diuretics are stopped in the near future, need to scale back KCl as well  2. Elevated troponin with notation of CAD on CT  - hsTroponin 49->39>33->343->1258->1140, repeat ordered by IM to trend - no prior cath per patient, only noted on imaging incidentally - on ASA, Plavix, statin PTA given history of stroke - was on high dose metoprolol at home -> switched to bisoprolol this admission given wheezing/lung disease and tolerating well  - may need cath this admission, timing TBD per MD  3. Paroxysmal atrial tachycardia - occasional bursts on telemetry more frequent this AM - increase diltiazem to 60mg  q6hr for now - continue to follow lytes  4. 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 Essential HTN - BP controlled  6. Thoracic aortic aneurysm - 4.8cm on CT 09/2020 - stable at 4.7cm by this admission CT -  unclear if [facility] this but will need OP oversight for this  7. Microcytic anemia - per IM  For questions or updates, please contact [place] Please consult [email or web address] for contact info under Cardiology/STEMI.  Signed, Charlie Pitter, PA-C 01/10/2021, 8:25 AM     Attending Note:   The patient was seen and examined.  Agree with assessment and plan as noted above.  Changes made to the above note as needed.  Patient seen and independently examined with  Melina Copa, PA .   We discussed  all aspects of the encounter. I agree with the assessment and plan as stated above.  1.  NSTEMI:    He is breathing better and is stable for cath tomorrow. I have discussed the risks, benefits, options with him and he understands and agrees to proceed.   2.  Chronic hypoxic respiratory failure.  CT angio was negative for PE .  He has severe pulmonary htn For right and left heart cath tomorrow   3.  HTN:   BP is controlled.     I have spent a total of 40 minutes with patient reviewing hospital  notes , telemetry, EKGs, labs and examining patient as well as establishing an assessment and plan that was discussed with the patient. > 50% of time was spent in direct patient care.    Thayer Headings, Brooke Bonito., MD, [facility] 01/10/2021, 11:26 AM [address],  [place] Pager [phone]

## 2021-01-10 NOTE — Progress Notes (Addendum)
Progress Note  Patient Name: Brandon [name] Date of Encounter: 01/10/2021  Primary Cardiologist: Follows at the [facility]  Subjective   Denies any SOB - appears more comfortable today on O2. Reports excellent UOP since starting Lasix. He is unaware of the frequent bursts observed on telemetry.  Inpatient Medications    Scheduled Meds: . aspirin  81 mg Oral Daily  . atorvastatin  40 mg Oral QHS  . bisoprolol  5 mg Oral Daily  . clopidogrel  75 mg Oral Daily  . diltiazem  30 mg Oral Q6H  . enoxaparin (LOVENOX) injection  40 mg Subcutaneous Q24H  . feeding supplement (GLUCERNA SHAKE)  237 mL Oral BID BM  . ferrous sulfate  325 mg Oral BID WC  . furosemide  40 mg Intravenous BID  . insulin aspart  0-5 Units Subcutaneous QHS  . insulin aspart  0-9 Units Subcutaneous TID WC  . insulin aspart  8 Units Subcutaneous TID WC  . insulin glargine  25 Units Subcutaneous BID  . ipratropium-albuterol  3 mL Nebulization TID  . magnesium oxide  400 mg Oral Daily  . mometasone-formoterol  2 puff Inhalation BID  . multivitamin with minerals  1 tablet Oral Daily  . nortriptyline  50 mg Oral QHS  . oxybutynin  10 mg Oral Daily  . pantoprazole  40 mg Oral Daily  . predniSONE  40 mg Oral Q breakfast  . Ensure Max Protein  11 oz Oral QHS  . vitamin B-12  1,000 mcg Oral Daily   Continuous Infusions: . cefTRIAXone (ROCEPHIN)  IV 1 g (01/09/21 2344)   PRN Meds: acetaminophen **OR** acetaminophen, albuterol, bisacodyl, HYDROcodone-acetaminophen, senna-docusate   Vital Signs    Vitals:   01/09/21 2035 01/09/21 2221 01/09/21 2327 01/10/21 0655  BP:  130/87 134/89 132/84  Pulse:  (!) 108 (!) 109 94  Resp:  20 20   Temp:  98 F (36.7 C) 98.3 F (36.8 C) 98 F (36.7 C)  TempSrc:  Oral Oral Oral  SpO2: 94% 94% 96%   Weight:    79.1 kg  Height:        Intake/Output Summary (Last 24 hours) at 01/10/2021 0825 Last data filed at 01/10/2021 0435 Gross per 24 hour  Intake 485.31 ml  Output 2350 ml   Net -1864.69 ml   Last 3 Weights 01/10/2021 01/09/2021 01/07/2021  Weight (lbs) 174 lb 6.1 oz 174 lb 12.8 oz 211 lb  Weight (kg) 79.1 kg 79.289 kg 95.709 kg     Telemetry    NSR/sinus tach with frequent bursts of brief PAT - Personally Reviewed  Physical Exam   GEN: Chronically ill appearing WM in no acute distress on [place] - appears more chipper, less SOB this AM HEENT: Normocephalic, atraumatic, sclera non-icteric. Neck: No JVD or bruits. Cardiac: Irregular due to NSR interspersed with frequent bursts of atrial tach, no murmurs, rubs, or gallops.  Radials/DP/PT 1+ and equal bilaterally.  Respiratory: Diminished at bases bilaterally. Less wheezing today. Breathing is unlabored. GI: Soft, nontender, non-distended, BS +x 4. MS: no deformity. Extremities: No clubbing or cyanosis. Correction to prior note: chronic venous stasis changes noted with chronic skin thickening and mild edema Neuro:  AAOx3. Follows commands. Psych:  Responds to questions appropriately with a normal affect.  Labs    High Sensitivity Troponin:   Recent Labs  Lab 01/07/21 2300 01/08/21 1227 01/08/21 2108 01/09/21 0816 01/09/21 1017  TROPONINIHS 33* 343* 1,258* 1,140* 982*      Cardiac EnzymesNo results  for input(s): TROPONINI in the last 168 hours. No results for input(s): TROPIPOC in the last 168 hours.   Chemistry Recent Labs  Lab 01/07/21 1408 01/07/21 2336 01/08/21 0339 01/09/21 0234 01/10/21 0304  NA 131*   < > 134* 135 134*  K 5.0   < > 4.9 4.2 3.9  CL 103  --  106 103 98  CO2 15*  --  18* 21* 23  GLUCOSE 150*  --  287* 308* 308*  BUN 18  --  15 20 26*  CREATININE 1.25*  --  1.10 1.04 1.21  CALCIUM 9.0  --  8.5* 9.0 9.0  PROT 6.5  --  5.7*  --   --   ALBUMIN 3.4*  --  2.9*  --   --   AST 20  --  14*  --   --   ALT 17  --  16  --   --   ALKPHOS 54  --  47  --   --   BILITOT 0.4  --  0.7  --   --   GFRNONAA >60  --  >60 >60 >60  ANIONGAP 13  --  10 11 13    < > = values in this  interval not displayed.     Hematology Recent Labs  Lab 01/08/21 0755 01/09/21 0234 01/10/21 0304  WBC 5.7 8.3 12.2*  RBC 4.65 4.49 4.58  HGB 10.9* 11.1* 11.2*  HCT 37.6* 34.4* 35.1*  MCV 80.9 76.6* 76.6*  MCH 23.4* 24.7* 24.5*  MCHC 29.0* 32.3 31.9  RDW 21.9* 21.6* 21.5*  PLT 328 338 359    BNP Recent Labs  Lab 01/07/21 1408 01/10/21 0304  BNP 337.4* 346.0*     DDimer  Recent Labs  Lab 01/08/21 2108  DDIMER 0.89*     Radiology    CT ANGIO CHEST PE W OR WO CONTRAST  Result Date: 01/09/2021 CLINICAL DATA:  COPD, short of breath. Prostate cancer. Rule out pulmonary embolism. EXAM: CT ANGIOGRAPHY CHEST WITH CONTRAST TECHNIQUE: Multidetector CT imaging of the chest was performed using the standard protocol during bolus administration of intravenous contrast. Multiplanar CT image reconstructions and MIPs were obtained to evaluate the vascular anatomy. CONTRAST:  49mL OMNIPAQUE IOHEXOL 350 MG/ML SOLN COMPARISON:  CT chest 10/13/2020 FINDINGS: Cardiovascular: Negative for pulmonary embolism. Pulmonary arteries diffusely enlarged compatible with pulmonary artery hypertension from COPD. Main pulmonary artery 3.8 cm in diameter. Atherosclerotic calcification aortic arch. Ascending aorta dilated at 4.7 cm unchanged from the prior study. No dissection. Extensive coronary artery calcification involving right and left coronary arteries. Heart size upper normal. No pericardial effusion. Mediastinum/Nodes: Negative for mass or adenopathy. Lungs/Pleura: Mild to moderate bilateral pleural effusions right greater than left with progression from the prior study. Mild dependent atelectasis in both lung bases. Underlying COPD with emphysema most notably in the apices. No acute infiltrate or mass in the lungs. Upper Abdomen: Liver cyst 6 x 4 cm unchanged from the prior study. Atherosclerotic calcification abdominal aorta and renal arteries. Musculoskeletal: Multilevel thoracic disc degeneration and  spurring. Sclerotic findings in the vertebral bodies appear degenerative. Negative for fracture. Review of the MIP images confirms the above findings. IMPRESSION: 1. Negative for pulmonary embolism 2. COPD with pulmonary artery hypertension 3. Enlarging bilateral pleural effusions right greater than left. 4. Aortic aneurysm NOS (ICD10-I71.9). 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 5. Extensive coronary calcification. Aortic Atherosclerosis (ICD10-I70.0) and Emphysema (ICD10-J43.9). Electronically Signed   By: Franchot Gallo M.D.   On:  01/09/2021 09:13   ECHOCARDIOGRAM COMPLETE  Result Date: 01/09/2021    ECHOCARDIOGRAM REPORT   Patient Name:   Brandon [name] Date of Exam: 01/09/2021 Medical Rec #:  [id number]     Height:       78.0 in Accession #:    [id number]    Weight:       174.8 lb Date of Birth:  [birth year]     BSA:          2.132 m Patient Age:    75 years      BP:           113/65 mmHg Patient Gender: M             HR:           93 bpm. Exam Location:  Inpatient Procedure: 2D Echo Indications:    elevated troponin  History:        Patient has prior history of Echocardiogram examinations, most                 recent 10/14/2020. COPD; Risk Factors:Diabetes.  Sonographer:    Johny Chess Referring Phys: [place]  1. Left ventricular ejection fraction, by estimation, is 60 to 65%. The left ventricle has normal function. The left ventricle has no regional wall motion abnormalities. There is mild concentric left ventricular hypertrophy and moderate basal septal hypertrophy.  2. Right ventricular systolic function is moderately reduced. The right ventricular size is moderately enlarged. There is severely elevated pulmonary artery systolic pressure. The estimated right ventricular systolic pressure is Q000111Q mmHg.  3. The mitral valve is degenerative. No evidence of mitral valve regurgitation. No evidence of mitral stenosis.  4. The inferior vena cava is dilated in size with  >50% respiratory variability, suggesting right atrial pressure of 8 mmHg.  5. The aortic valve is tricuspid. There is moderate calcification of the aortic valve primarily of the left coronary cusp. There is moderate thickening of the aortic valve. Aortic valve regurgitation is mild. Mild to moderate aortic valve sclerosis/calcification is present, without any evidence of aortic stenosis.  6. There is mild dilatation at the level of the sinuses of Valsalva, measuring 37 mm. There is mild dilatation of the ascending aorta, measuring 42 mm.  7. Tricuspid valve regurgitation is mild to moderate.  8. Right atrial size was moderately dilated. FINDINGS  Left Ventricle: Left ventricular ejection fraction, by estimation, is 65 to 70%. The left ventricle has normal function. The left ventricle has no regional wall motion abnormalities. The left ventricular internal cavity size was normal in size. There is  mild concentric left ventricular hypertrophy. Left ventricular diastolic function could not be evaluated. Right Ventricle: The right ventricular size is moderately enlarged. No increase in right ventricular wall thickness. Right ventricular systolic function is moderately reduced. There is severely elevated pulmonary artery systolic pressure. The tricuspid regurgitant velocity is 3.76 m/s, and with an assumed right atrial pressure of 8 mmHg, the estimated right ventricular systolic pressure is Q000111Q mmHg. Left Atrium: Left atrial size was normal in size. Right Atrium: Right atrial size was moderately dilated. Pericardium: There is no evidence of pericardial effusion. Mitral Valve: The mitral valve is degenerative in appearance. There is mild thickening of the mitral valve leaflet(s). There is mild calcification of the mitral valve leaflet(s). Mild to moderate mitral annular calcification. No evidence of mitral valve regurgitation. No evidence of mitral valve stenosis. Tricuspid Valve: The tricuspid valve is normal in  structure. Tricuspid valve  regurgitation is mild to moderate. No evidence of tricuspid stenosis. Aortic Valve: The aortic valve is tricuspid. There is moderate calcification of the aortic valve. There is moderate thickening of the aortic valve. Aortic valve regurgitation is mild. Mild to moderate aortic valve sclerosis/calcification is present, without any evidence of aortic stenosis. Pulmonic Valve: The pulmonic valve was normal in structure. Pulmonic valve regurgitation is trivial. No evidence of pulmonic stenosis. Aorta: The aortic root is normal in size and structure. There is mild dilatation at the level of the sinuses of Valsalva, measuring 37 mm. There is mild dilatation of the ascending aorta, measuring 42 mm. Venous: The inferior vena cava is dilated in size with greater than 50% respiratory variability, suggesting right atrial pressure of 8 mmHg. IAS/Shunts: No atrial level shunt detected by color flow Doppler.  LEFT VENTRICLE PLAX 2D LVIDd:         4.10 cm LVIDs:         2.60 cm LV PW:         1.20 cm LV IVS:        1.30 cm LVOT diam:     2.30 cm LV SV:         88 LV SV Index:   41 LVOT Area:     4.15 cm  RIGHT VENTRICLE             IVC RV S prime:     13.60 cm/s  IVC diam: 2.50 cm TAPSE (M-mode): 1.6 cm LEFT ATRIUM             Index       RIGHT ATRIUM           Index LA diam:        4.20 cm 1.97 cm/m  RA Area:     19.50 cm LA Vol (A2C):   50.5 ml 23.69 ml/m RA Volume:   55.30 ml  25.94 ml/m LA Vol (A4C):   71.4 ml 33.49 ml/m LA Biplane Vol: 64.9 ml 30.44 ml/m  AORTIC VALVE LVOT Vmax:   96.00 cm/s LVOT Vmean:  66.400 cm/s LVOT VTI:    0.212 m  AORTA Ao Root diam: 3.70 cm Ao Asc diam:  4.20 cm TRICUSPID VALVE TR Peak grad:   56.6 mmHg TR Vmax:        376.00 cm/s  SHUNTS Systemic VTI:  0.21 m Systemic Diam: 2.30 cm Fransico Him MD Electronically signed by Fransico Him MD Signature Date/Time: 01/09/2021/10:24:02 AM    Final     Cardiac Studies   2D Echo 12/2020 1. Left ventricular ejection  fraction, by estimation, is 60 to 65%. The  left ventricle has normal function. The left ventricle has no regional  wall motion abnormalities. There is mild concentric left ventricular  hypertrophy and moderate basal septal  hypertrophy.  2. Right ventricular systolic function is moderately reduced. The right  ventricular size is moderately enlarged. There is severely elevated  pulmonary artery systolic pressure. The estimated right ventricular  systolic pressure is Q000111Q mmHg.  3. The mitral valve is degenerative. No evidence of mitral valve  regurgitation. No evidence of mitral stenosis.  4. The inferior vena cava is dilated in size with >50% respiratory  variability, suggesting right atrial pressure of 8 mmHg.  5. The aortic valve is tricuspid. There is moderate calcification of the  aortic valve primarily of the left coronary cusp. There is moderate  thickening of the aortic valve. Aortic valve regurgitation is mild. Mild  to moderate aortic  valve  sclerosis/calcification is present, without any evidence of aortic  stenosis.  6. There is mild dilatation at the level of the sinuses of Valsalva,  measuring 37 mm. There is mild dilatation of the ascending aorta,  measuring 42 mm.  7. Tricuspid valve regurgitation is mild to moderate.  8. Right atrial size was moderately dilated.   Patient Profile     75 y.o. 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 Cardiology consulted for elevated troponin.  Assessment & Plan    1. Acute on chronic hypoxic respiratory failure - on 4L O2 at baseline, requiring 10L on presentation in context of  being out of oxygen prior to admission - Covid negative on arrival - IM treating AECOPD - CT angio 01/10/20 showed no PE, + COPD with PAH, enlarging mild-moderate pleural effusions - 2D echo 01/10/20 with normal EF, moderately reduced RV function, severe pulmonary HTN, mild AI, mild-moderate TR, mild dilation of ascending aorta which will need to be followed as OP - started on IV Lasix yesterday with good UOP (-2.5L output yesterday) - continue diuresis for now but continue to follow renal function - I tentatively added low dose potassium with diuresis so if diuretics are stopped in the near future, need to scale back KCl as well  2. Elevated troponin with notation of CAD on CT  - hsTroponin 49->39>33->343->1258->1140, repeat ordered by IM to trend - no prior cath per patient, only noted on imaging incidentally - on ASA, Plavix, statin PTA given history of stroke - was on high dose metoprolol at home -> switched to bisoprolol this admission given wheezing/lung disease and tolerating well  - may need cath this admission, timing TBD per MD  3. Paroxysmal atrial tachycardia - occasional bursts on telemetry more frequent this AM - increase diltiazem to 60mg  q6hr for now - continue to follow lytes  4. 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 Essential HTN - BP controlled  6. Thoracic aortic aneurysm - 4.8cm on CT 09/2020 - stable at 4.7cm by this admission CT -  unclear if [facility] this but will need OP oversight for this  7. Microcytic anemia - per IM  For questions or updates, please contact [place] Please consult [email or web address] for contact info under Cardiology/STEMI.  Signed, Charlie Pitter, PA-C 01/10/2021, 8:25 AM     Attending Note:   The patient was seen and examined.  Agree with assessment and plan as noted above.  Changes made to the above note as needed.  Patient seen and independently examined with  Melina Copa, PA .   We discussed  all aspects of the encounter. I agree with the assessment and plan as stated above.  1.  NSTEMI:    He is breathing better and is stable for cath tomorrow. I have discussed the risks, benefits, options with him and he understands and agrees to proceed.   2.  Chronic hypoxic respiratory failure.  CT angio was negative for PE .  He has severe pulmonary htn For right and left heart cath tomorrow   3.  HTN:   BP is controlled.     I have spent a total of 40 minutes with patient reviewing hospital  notes , telemetry, EKGs, labs and examining patient as well as establishing an assessment and plan that was discussed with the patient. > 50% of time was spent in direct patient care.    Thayer Headings, Brooke Bonito., MD, [facility] 01/10/2021, 11:26 AM [address],  [place] Pager [phone]

## 2021-01-10 NOTE — Progress Notes (Addendum)
Dr. Acie Fredrickson requests to arrange [facility] on this patient for tomorrow, placed on board for 10am for Dr. Burt Knack for tomorrow. Per our discussion orders written and will also make last dose of Lasix this PM with potassium, repeat labs in AM. I reached out to IM to request they adjust his insulin since he'll be NPO after midnight - we usually do half dose in prep for cath but his dose was just increased starting today along with ongoing meal coverage so appreciate their input on this.

## 2021-01-11 ENCOUNTER — Encounter (HOSPITAL_COMMUNITY)
Admission: EM | Disposition: A | Discharge status: Home Health | Payer: Self-pay | Source: Home / Self Care | Attending: Internal Medicine

## 2021-01-11 DIAGNOSIS — J441 Chronic obstructive pulmonary disease with (acute) exacerbation: Secondary | ICD-10-CM | POA: Diagnosis not present

## 2021-01-11 DIAGNOSIS — E440 Moderate protein-calorie malnutrition: Secondary | ICD-10-CM

## 2021-01-11 DIAGNOSIS — Z720 Tobacco use: Secondary | ICD-10-CM

## 2021-01-11 DIAGNOSIS — I2510 Atherosclerotic heart disease of native coronary artery without angina pectoris: Secondary | ICD-10-CM | POA: Diagnosis not present

## 2021-01-11 DIAGNOSIS — E871 Hypo-osmolality and hyponatremia: Secondary | ICD-10-CM

## 2021-01-11 DIAGNOSIS — R778 Other specified abnormalities of plasma proteins: Secondary | ICD-10-CM | POA: Diagnosis not present

## 2021-01-11 DIAGNOSIS — J9621 Acute and chronic respiratory failure with hypoxia: Secondary | ICD-10-CM | POA: Diagnosis not present

## 2021-01-11 DIAGNOSIS — K219 Gastro-esophageal reflux disease without esophagitis: Secondary | ICD-10-CM

## 2021-01-11 HISTORY — PX: RIGHT/LEFT HEART CATH AND CORONARY ANGIOGRAPHY: CATH118266

## 2021-01-11 HISTORY — PX: CORONARY ATHERECTOMY: CATH118238

## 2021-01-11 LAB — BASIC METABOLIC PANEL
Anion gap: 14 (ref 5–15)
BUN: 28 mg/dL — ABNORMAL HIGH (ref 8–23)
CO2: 26 mmol/L (ref 22–32)
Calcium: 9.2 mg/dL (ref 8.9–10.3)
Chloride: 93 mmol/L — ABNORMAL LOW (ref 98–111)
Creatinine, Ser: 1.15 mg/dL (ref 0.61–1.24)
GFR, Estimated: >60 mL/min (ref >60)
Glucose, Bld: 268 mg/dL — ABNORMAL HIGH (ref 70–99)
Potassium: 3.8 mmol/L (ref 3.5–5.1)
Sodium: 133 mmol/L — ABNORMAL LOW (ref 135–145)

## 2021-01-11 LAB — POCT I-STAT 7, (LYTES, BLD GAS, ICA,H+H)
Acid-Base Excess: 4 mmol/L — ABNORMAL HIGH (ref 0.0–2.0)
Bicarbonate: 28.6 mmol/L — ABNORMAL HIGH (ref 20.0–28.0)
Calcium, Ion: 1.24 mmol/L (ref 1.15–1.40)
HCT: 35 % — ABNORMAL LOW (ref 39.0–52.0)
Hemoglobin: 11.9 g/dL — ABNORMAL LOW (ref 13.0–17.0)
O2 Saturation: 94 %
Potassium: 3.9 mmol/L (ref 3.5–5.1)
Sodium: 135 mmol/L (ref 135–145)
TCO2: 30 mmol/L (ref 22–32)
pCO2 arterial: 41.7 mmHg (ref 32.0–48.0)
pH, Arterial: 7.444 (ref 7.350–7.450)
pO2, Arterial: 68 mmHg — ABNORMAL LOW (ref 83.0–108.0)

## 2021-01-11 LAB — POCT I-STAT EG7
Acid-Base Excess: 6 mmol/L — ABNORMAL HIGH (ref 0.0–2.0)
Bicarbonate: 31 mmol/L — ABNORMAL HIGH (ref 20.0–28.0)
Calcium, Ion: 1.28 mmol/L (ref 1.15–1.40)
HCT: 37 % — ABNORMAL LOW (ref 39.0–52.0)
Hemoglobin: 12.6 g/dL — ABNORMAL LOW (ref 13.0–17.0)
O2 Saturation: 70 %
Potassium: 4.1 mmol/L (ref 3.5–5.1)
Sodium: 135 mmol/L (ref 135–145)
TCO2: 32 mmol/L (ref 22–32)
pCO2, Ven: 46.5 mmHg (ref 44.0–60.0)
pH, Ven: 7.431 — ABNORMAL HIGH (ref 7.250–7.430)
pO2, Ven: 36 mmHg (ref 32.0–45.0)

## 2021-01-11 LAB — CBC WITH DIFFERENTIAL/PLATELET
Abs Immature Granulocytes: 0.06 10*3/uL (ref 0.00–0.07)
Basophils Absolute: 0 10*3/uL (ref 0.0–0.1)
Basophils Relative: 0 %
Eosinophils Absolute: 0 10*3/uL (ref 0.0–0.5)
Eosinophils Relative: 0 %
HCT: 37.3 % — ABNORMAL LOW (ref 39.0–52.0)
Hemoglobin: 11.4 g/dL — ABNORMAL LOW (ref 13.0–17.0)
Immature Granulocytes: 1 %
Lymphocytes Relative: 6 %
Lymphs Abs: 0.7 10*3/uL (ref 0.7–4.0)
MCH: 23.4 pg — ABNORMAL LOW (ref 26.0–34.0)
MCHC: 30.6 g/dL (ref 30.0–36.0)
MCV: 76.4 fL — ABNORMAL LOW (ref 80.0–100.0)
Monocytes Absolute: 1 10*3/uL (ref 0.1–1.0)
Monocytes Relative: 8 %
Neutro Abs: 10.8 10*3/uL — ABNORMAL HIGH (ref 1.7–7.7)
Neutrophils Relative %: 85 %
Platelets: 362 10*3/uL (ref 150–400)
RBC: 4.88 MIL/uL (ref 4.22–5.81)
RDW: 21.4 % — ABNORMAL HIGH (ref 11.5–15.5)
WBC: 12.6 10*3/uL — ABNORMAL HIGH (ref 4.0–10.5)
nRBC: 0.2 % (ref 0.0–0.2)

## 2021-01-11 LAB — MAGNESIUM: Magnesium: 1.8 mg/dL (ref 1.7–2.4)

## 2021-01-11 LAB — GLUCOSE, CAPILLARY
Glucose-Capillary: 270 mg/dL — ABNORMAL HIGH (ref 70–99)
Glucose-Capillary: 219 mg/dL — ABNORMAL HIGH (ref 70–99)
Glucose-Capillary: 133 mg/dL — ABNORMAL HIGH (ref 70–99)

## 2021-01-11 LAB — POCT ACTIVATED CLOTTING TIME
Activated Clotting Time: 226 seconds
Activated Clotting Time: 279 seconds
Activated Clotting Time: 285 seconds
Activated Clotting Time: 297 seconds

## 2021-01-11 SURGERY — RIGHT/LEFT HEART CATH AND CORONARY ANGIOGRAPHY
Anesthesia: LOCAL

## 2021-01-11 MED ORDER — HEPARIN SODIUM (PORCINE) 1000 UNIT/ML IJ SOLN
INTRAMUSCULAR | Status: AC
  Filled 2021-01-11: qty 1

## 2021-01-11 MED ORDER — SODIUM CHLORIDE 0.9 % WEIGHT BASED INFUSION
1.0000 mL/kg/h | INTRAVENOUS | Status: AC
  Administered 2021-01-11 (×2): 1 mL/kg/h via INTRAVENOUS

## 2021-01-11 MED ORDER — FENTANYL CITRATE (PF) 100 MCG/2ML IJ SOLN
INTRAMUSCULAR | Status: DC | PRN
  Administered 2021-01-11 (×2): 25 ug via INTRAVENOUS

## 2021-01-11 MED ORDER — HEPARIN SODIUM (PORCINE) 1000 UNIT/ML IJ SOLN
INTRAMUSCULAR | Status: DC | PRN
  Administered 2021-01-11: 3000 [IU] via INTRAVENOUS
  Administered 2021-01-11 (×2): 4000 [IU] via INTRAVENOUS
  Administered 2021-01-11: 3000 [IU] via INTRAVENOUS
  Administered 2021-01-11: 4000 [IU] via INTRAVENOUS

## 2021-01-11 MED ORDER — VERAPAMIL HCL 2.5 MG/ML IV SOLN
INTRAVENOUS | Status: DC | PRN
  Administered 2021-01-11: 10 mL via INTRA_ARTERIAL

## 2021-01-11 MED ORDER — ONDANSETRON HCL 4 MG/2ML IJ SOLN: 4.0000 mg | Freq: Four times a day (QID) | INTRAMUSCULAR | Status: DC | PRN

## 2021-01-11 MED ORDER — HEPARIN (PORCINE) IN NACL 1000-0.9 UT/500ML-% IV SOLN
INTRAVENOUS | Status: AC
  Filled 2021-01-11: qty 500

## 2021-01-11 MED ORDER — MIDAZOLAM HCL 2 MG/2ML IJ SOLN
INTRAMUSCULAR | Status: DC | PRN
  Administered 2021-01-11 (×2): 1 mg via INTRAVENOUS

## 2021-01-11 MED ORDER — IOHEXOL 350 MG/ML SOLN
INTRAVENOUS | Status: DC | PRN
  Administered 2021-01-11: 120 mL

## 2021-01-11 MED ORDER — NITROGLYCERIN 1 MG/10 ML FOR IR/CATH LAB
INTRA_ARTERIAL | Status: AC
  Filled 2021-01-11: qty 10

## 2021-01-11 MED ORDER — CLOPIDOGREL BISULFATE 300 MG PO TABS
ORAL_TABLET | ORAL | Status: DC | PRN
  Administered 2021-01-11: 300 mg via ORAL

## 2021-01-11 MED ORDER — CLOPIDOGREL BISULFATE 300 MG PO TABS
ORAL_TABLET | ORAL | Status: AC
  Filled 2021-01-11: qty 1

## 2021-01-11 MED ORDER — FENTANYL CITRATE (PF) 100 MCG/2ML IJ SOLN
INTRAMUSCULAR | Status: AC
  Filled 2021-01-11: qty 2

## 2021-01-11 MED ORDER — ALBUTEROL SULFATE (2.5 MG/3ML) 0.083% IN NEBU: 2.5000 mg | INHALATION_SOLUTION | Freq: Four times a day (QID) | RESPIRATORY_TRACT | Status: DC | PRN

## 2021-01-11 MED ORDER — MAGNESIUM SULFATE 2 GM/50ML IV SOLN
2.0000 g | Freq: Once | INTRAVENOUS | Status: AC
  Administered 2021-01-11: 2 g via INTRAVENOUS
  Filled 2021-01-11: qty 50

## 2021-01-11 MED ORDER — SODIUM CHLORIDE 0.9% FLUSH: 3.0000 mL | INTRAVENOUS | Status: DC | PRN

## 2021-01-11 MED ORDER — LIDOCAINE HCL (PF) 1 % IJ SOLN
INTRAMUSCULAR | Status: DC | PRN
  Administered 2021-01-11: 4 mL

## 2021-01-11 MED ORDER — NITROGLYCERIN 1 MG/10 ML FOR IR/CATH LAB
INTRA_ARTERIAL | Status: DC | PRN
  Administered 2021-01-11: 150 ug via INTRACORONARY

## 2021-01-11 MED ORDER — VERAPAMIL HCL 2.5 MG/ML IV SOLN
INTRAVENOUS | Status: AC
  Filled 2021-01-11: qty 2

## 2021-01-11 MED ORDER — LIDOCAINE HCL (PF) 1 % IJ SOLN
INTRAMUSCULAR | Status: AC
  Filled 2021-01-11: qty 30

## 2021-01-11 MED ORDER — HYDRALAZINE HCL 20 MG/ML IJ SOLN: 10.0000 mg | INTRAMUSCULAR | Status: AC | PRN

## 2021-01-11 MED ORDER — SODIUM CHLORIDE 0.9 % IV SOLN: 250.0000 mL | INTRAVENOUS | Status: DC | PRN

## 2021-01-11 MED ORDER — MIDAZOLAM HCL 2 MG/2ML IJ SOLN
INTRAMUSCULAR | Status: AC
  Filled 2021-01-11: qty 2

## 2021-01-11 MED ORDER — SODIUM CHLORIDE 0.9% FLUSH
3.0000 mL | Freq: Two times a day (BID) | INTRAVENOUS | Status: DC
  Administered 2021-01-12: 3 mL via INTRAVENOUS

## 2021-01-11 MED ORDER — HEPARIN (PORCINE) IN NACL 1000-0.9 UT/500ML-% IV SOLN
INTRAVENOUS | Status: AC
  Filled 2021-01-11: qty 1000

## 2021-01-11 MED ORDER — HEPARIN (PORCINE) IN NACL 1000-0.9 UT/500ML-% IV SOLN
INTRAVENOUS | Status: DC | PRN
  Administered 2021-01-11 (×3): 500 mL

## 2021-01-11 MED ORDER — LABETALOL HCL 5 MG/ML IV SOLN: 10.0000 mg | INTRAVENOUS | Status: AC | PRN

## 2021-01-11 MED ORDER — IPRATROPIUM-ALBUTEROL 0.5-2.5 (3) MG/3ML IN SOLN
3.0000 mL | Freq: Two times a day (BID) | RESPIRATORY_TRACT | Status: DC
  Administered 2021-01-11 – 2021-01-14 (×5): 3 mL via RESPIRATORY_TRACT
  Filled 2021-01-11 (×6): qty 3

## 2021-01-11 SURGICAL SUPPLY — 28 items
BAG SNAP BAND KOVER 36X36 (MISCELLANEOUS) ×1 IMPLANT
BALLN SAPPHIRE 3.0X15 (BALLOONS) ×2
BALLOON SAPPHIRE 3.0X15 (BALLOONS) IMPLANT
CATH 5FR JL3.5 JR4 ANG PIG MP (CATHETERS) ×1 IMPLANT
CATH BALLN WEDGE 5F 110CM (CATHETERS) ×1 IMPLANT
CATH INFINITI 5FR JL4 (CATHETERS) ×1 IMPLANT
CATH LAUNCHER 6FR EBU 3.75 (CATHETERS) ×1 IMPLANT
CATH TELEPORT (CATHETERS) ×1 IMPLANT
CATH VISTA GUIDE 6FR JL4 (CATHETERS) ×1 IMPLANT
CATH VISTA GUIDE 6FR XBLAD4 (CATHETERS) ×1 IMPLANT
COVER DOME SNAP 22 D (MISCELLANEOUS) ×1 IMPLANT
CROWN DIAMONDBACK CLASSIC 1.25 (BURR) ×1 IMPLANT
DEVICE RAD TR BAND REGULAR (VASCULAR PRODUCTS) ×1 IMPLANT
GLIDESHEATH SLEND SS 6F .021 (SHEATH) ×1 IMPLANT
GUIDEWIRE INQWIRE 1.5J.035X260 (WIRE) IMPLANT
INQWIRE 1.5J .035X260CM (WIRE) ×2
KIT ENCORE 26 ADVANTAGE (KITS) ×1 IMPLANT
KIT HEART LEFT (KITS) ×2 IMPLANT
KIT HEMO VALVE WATCHDOG (MISCELLANEOUS) ×1 IMPLANT
LUBRICANT VIPERSLIDE CORONARY (MISCELLANEOUS) ×1 IMPLANT
PACK CARDIAC CATHETERIZATION (CUSTOM PROCEDURE TRAY) ×2 IMPLANT
SHEATH GLIDE SLENDER 4/5FR (SHEATH) ×1 IMPLANT
TRANSDUCER W/STOPCOCK (MISCELLANEOUS) ×2 IMPLANT
TUBING CIL FLEX 10 FLL-RA (TUBING) ×2 IMPLANT
WIRE COUGAR XT STRL 190CM (WIRE) ×1 IMPLANT
WIRE COUGAR XT STRL 300CM (WIRE) ×1 IMPLANT
WIRE EMERALD 3MM-J .025X260CM (WIRE) ×1 IMPLANT
WIRE VIPERWIRE COR FLEX .012 (WIRE) ×2 IMPLANT

## 2021-01-11 NOTE — Interval H&P Note (Signed)
History and Physical Interval Note:  01/11/2021 8:37 AM  Brandon [name]  has presented today for surgery, with the diagnosis of NSTEMI.  The various methods of treatment have been discussed with the patient and family. After consideration of risks, benefits and other options for treatment, the patient has consented to  Procedure(s): RIGHT/LEFT HEART CATH AND CORONARY ANGIOGRAPHY (N/A) as a surgical intervention.  The patient's history has been reviewed, patient examined, no change in status, stable for surgery.  I have reviewed the patient's chart and labs.  Questions were answered to the patient's satisfaction.     Sherren Mocha

## 2021-01-11 NOTE — Progress Notes (Addendum)
Progress Note  Patient Name: Brandon [name] Date of Encounter: 01/12/2021  [facility] Cardiologist: Follows at [place]   Right and left heart cath yesterday with severe proximal LAD stenosis of 90%. Atherectomy was attempted but ultimately unsuccessful due to angulation of left main/LAD origin and lack of contralateral aortic support. Plan for repeat heart cath tomorrow via femoral access.   Inpatient Medications    Scheduled Meds: . aspirin  81 mg Oral Daily  . atorvastatin  40 mg Oral QHS  . bisoprolol  5 mg Oral Daily  . clopidogrel  75 mg Oral Daily  . diltiazem  60 mg Oral Q6H  . enoxaparin (LOVENOX) injection  40 mg Subcutaneous Q24H  . feeding supplement (GLUCERNA SHAKE)  237 mL Oral BID BM  . ferrous sulfate  325 mg Oral BID WC  . insulin aspart  0-5 Units Subcutaneous QHS  . insulin aspart  0-9 Units Subcutaneous TID WC  . insulin aspart  8 Units Subcutaneous TID WC  . insulin glargine  25 Units Subcutaneous Daily  . ipratropium-albuterol  3 mL Nebulization BID  . magnesium oxide  400 mg Oral Daily  . mometasone-formoterol  2 puff Inhalation BID  . multivitamin with minerals  1 tablet Oral Daily  . nortriptyline  50 mg Oral QHS  . oxybutynin  10 mg Oral Daily  . pantoprazole  40 mg Oral Daily  . predniSONE  40 mg Oral Q breakfast  . Ensure Max Protein  11 oz Oral QHS  . sodium chloride flush  3 mL Intravenous Q12H  . sodium chloride flush  3 mL Intravenous Q12H  . vitamin B-12  1,000 mcg Oral Daily   Continuous Infusions: . sodium chloride     PRN Meds: sodium chloride, acetaminophen **OR** acetaminophen, albuterol, bisacodyl, HYDROcodone-acetaminophen, ondansetron (ZOFRAN) IV, senna-docusate, sodium chloride flush   Vital Signs    Vitals:   01/11/21 2050 01/11/21 2341 01/12/21 0550 01/12/21 0714  BP:  117/70 131/79   Pulse:   89   Resp:      Temp:   (!) 97.2 F (36.2 C)   TempSrc:   Axillary   SpO2: 92%  96% 93%  Weight:      Height:         Intake/Output Summary (Last 24 hours) at 01/12/2021 0745 Last data filed at 01/12/2021 0553 Gross per 24 hour  Intake 288.87 ml  Output 350 ml  Net -61.13 ml   Last 3 Weights 01/11/2021 01/10/2021 01/09/2021  Weight (lbs) 167 lb 12.3 oz 174 lb 6.1 oz 174 lb 12.8 oz  Weight (kg) 76.1 kg 79.1 kg 79.289 kg      Telemetry    Sinus rhythm HR 80-90s - Personally Reviewed  ECG    Sinus rhythm with PACs HR 85 - Personally Reviewed  Physical Exam   GEN: No acute distress.   Neck: No JVD Cardiac: RRR, + murmur Respiratory: Clear to auscultation bilaterally. GI: Soft, nontender, non-distended  MS: chronic skin changes, no edema Neuro:  Nonfocal  Psych: Normal affect  Right radial site C/D/I, covered, no bleeding  Labs    High Sensitivity Troponin:   Recent Labs  Lab 01/07/21 2300 01/08/21 1227 01/08/21 2108 01/09/21 0816 01/09/21 1017  TROPONINIHS 33* 343* 1,258* 1,140* 982*      Chemistry Recent Labs  Lab 01/07/21 1408 01/07/21 2336 01/08/21 0339 01/09/21 0234 01/10/21 0304 01/11/21 0219 01/11/21 0904 01/11/21 0918 01/12/21 0256  NA 131*   < > 134*   < >  134* 133* 135 135 133*  K 5.0   < > 4.9   < > 3.9 3.8 4.1 3.9 5.0  CL 103  --  106   < > 98 93*  --   --  100  CO2 15*  --  18*   < > 23 26  --   --  25  GLUCOSE 150*  --  287*   < > 308* 268*  --   --  216*  BUN 18  --  15   < > 26* 28*  --   --  26*  CREATININE 1.25*  --  1.10   < > 1.21 1.15  --   --  0.99  CALCIUM 9.0  --  8.5*   < > 9.0 9.2  --   --  8.9  PROT 6.5  --  5.7*  --   --   --   --   --  5.5*  ALBUMIN 3.4*  --  2.9*  --   --   --   --   --  3.0*  AST 20  --  14*  --   --   --   --   --  20  ALT 17  --  16  --   --   --   --   --  21  ALKPHOS 54  --  47  --   --   --   --   --  50  BILITOT 0.4  --  0.7  --   --   --   --   --  0.7  GFRNONAA >60  --  >60   < > >60 >60  --   --  >60  ANIONGAP 13  --  10   < > 13 14  --   --  8   < > = values in this interval not displayed.      Hematology Recent Labs  Lab 01/10/21 0304 01/11/21 0219 01/11/21 0904 01/11/21 0918 01/12/21 0256  WBC 12.2* 12.6*  --   --  10.6*  RBC 4.58 4.88  --   --  4.55  HGB 11.2* 11.4* 12.6* 11.9* 10.7*  HCT 35.1* 37.3* 37.0* 35.0* 35.5*  MCV 76.6* 76.4*  --   --  78.0*  MCH 24.5* 23.4*  --   --  23.5*  MCHC 31.9 30.6  --   --  30.1  RDW 21.5* 21.4*  --   --  21.2*  PLT 359 362  --   --  327    BNP Recent Labs  Lab 01/07/21 1408 01/10/21 0304  BNP 337.4* 346.0*     DDimer  Recent Labs  Lab 01/08/21 2108  DDIMER 0.89*     Radiology    CARDIAC CATHETERIZATION  Result Date: 01/11/2021 1.  Severe calcific single-vessel coronary artery disease involving a severely calcified eccentric proximal LAD lesion 2.  Patent left main, patent left circumflex with moderate nonobstructive stenosis, patent RCA with moderate nonobstructive stenosis 3.  Moderate pulmonary hypertension with normal pulmonary capillary wedge pressure/LVEDP 4.  Unsuccessful orbital atherectomy due to inability to fully treat the lesion. Recommendations: Recommend another attempt at PCI from femoral access once the patient recovers from this procedure.  Hopefully femoral access will give better guide support and allow for completion of the procedure.  I think that the lack of contralateral aortic support and steep angulation of the left main/LAD origin contributed to technical challenges with  this case.  Plan reviewed with the patient.  Continue aspirin and clopidogrel.   Cardiac Studies   Left heart cath 01/11/21: 1.  Severe calcific single-vessel coronary artery disease involving a severely calcified eccentric proximal LAD lesion 2.  Patent left main, patent left circumflex with moderate nonobstructive stenosis, patent RCA with moderate nonobstructive stenosis 3.  Moderate pulmonary hypertension with normal pulmonary capillary wedge pressure/LVEDP 4.  Unsuccessful orbital atherectomy due to inability to fully  treat the lesion.  Recommendations: Recommend another attempt at PCI from femoral access once the patient recovers from this procedure.  Hopefully femoral access will give better guide support and allow for completion of the procedure.  I think that the lack of contralateral aortic support and steep angulation of the left main/LAD origin contributed to technical challenges with this case.  Plan reviewed with the patient.  Continue aspirin and clopidogrel.   Echo 01/09/21: 1. Left ventricular ejection fraction, by estimation, is 60 to 65%. The  left ventricle has normal function. The left ventricle has no regional  wall motion abnormalities. There is mild concentric left ventricular  hypertrophy and moderate basal septal  hypertrophy.  2. Right ventricular systolic function is moderately reduced. The right  ventricular size is moderately enlarged. There is severely elevated  pulmonary artery systolic pressure. The estimated right ventricular  systolic pressure is 47.4 mmHg.  3. The mitral valve is degenerative. No evidence of mitral valve  regurgitation. No evidence of mitral stenosis.  4. The inferior vena cava is dilated in size with >50% respiratory  variability, suggesting right atrial pressure of 8 mmHg.  5. The aortic valve is tricuspid. There is moderate calcification of the  aortic valve primarily of the left coronary cusp. There is moderate  thickening of the aortic valve. Aortic valve regurgitation is mild. Mild  to moderate aortic valve  sclerosis/calcification is present, without any evidence of aortic  stenosis.  6. There is mild dilatation at the level of the sinuses of Valsalva,  measuring 37 mm. There is mild dilatation of the ascending aorta,  measuring 42 mm.  7. Tricuspid valve regurgitation is mild to moderate.  8. Right atrial size was moderately dilated.   Patient Profile     75 y.o. 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 Cardiology consulted for elevated troponin.  Assessment & Plan    Acute on chronic hypoxic respiratory failure - baseline O2 is 4L, required 10L on presentation because she was out of oxygen - COPD exacerbation per primary - CT with COPD and enlarging pleural effusions - per primary   Moderately reduced RV function Severe pulmonary arterial hypertension Mild to moderate TR - EF 60-65%, mild LVH, severely elevated PA pressure - has received 40 mg IV lasix x 4 doses, overall net negative 3.6 L, weight is 167 lbs from 174 lbs on admission - K 5.0, normal renal function - appears euvolemic   NSTEMI CAD - hs troponin 49 --> 39 --> 33 --> 343 --> 1258 --< 1140 --> 982 - heart cath yesterday showed 90% proximal LAD stenosis with unsuccessful atherectomy - will re-attempt atherectomy from femoral approach tomorrow - no chest pain - continue ASA and plavix   Paroxysmal atrial  tachycardia - cardizem 60 mg q6hr - heart rates have been controlled - no indication for anticoagulation - telemetry with sinus rhythm and PACs - consider consolidating cardizem to 240 mg daily   Hx of 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   Thoracic aorta aneurysm - stable - 4.8 cm on CT  09/2020 --> 4.7 cm on CT this admission (measured 4.2 cm on echo) - will need monitoring - [facility] - good BP control   Hypertension - pressure well-controlled - continue BB and cardizem --> consolidate cardizem as above   DM - insulin, per  primary          For questions or updates, please contact [facility] Please consult [email or web address] for contact info under        Signed, Ledora Bottcher, PA  01/12/2021, 7:45 AM    Attending Note:   The patient was seen and examined.  Agree with assessment and plan as noted above.  Changes made to the above note as needed.  Patient seen and independently examined with Doreene Adas, PA .   We discussed all aspects of the encounter. I agree with the assessment and plan as stated above.  1.   CAD :  Plan is for atherectomy tomorrow  We have discussed the risks, benefits,option.  He understands and agrees to proceed    2.  Pulmonary HTN:  Due to COPD  3.    I have spent a total of 40 minutes with patient reviewing hospital  notes , telemetry, EKGs, labs and examining patient as well as establishing an assessment and plan that was discussed with the patient. > 50% of time was spent in direct patient care.    Thayer Headings, Brooke Bonito., MD, [facility] 01/12/2021, 11:33 AM [address]  [place] Pager [phone]

## 2021-01-11 NOTE — H&P (View-Only) (Signed)
Progress Note  Patient Name: Brandon [name] Date of Encounter: 01/12/2021  [facility] Cardiologist: Follows at [place]   Right and left heart cath yesterday with severe proximal LAD stenosis of 90%. Atherectomy was attempted but ultimately unsuccessful due to angulation of left main/LAD origin and lack of contralateral aortic support. Plan for repeat heart cath tomorrow via femoral access.   Inpatient Medications    Scheduled Meds: . aspirin  81 mg Oral Daily  . atorvastatin  40 mg Oral QHS  . bisoprolol  5 mg Oral Daily  . clopidogrel  75 mg Oral Daily  . diltiazem  60 mg Oral Q6H  . enoxaparin (LOVENOX) injection  40 mg Subcutaneous Q24H  . feeding supplement (GLUCERNA SHAKE)  237 mL Oral BID BM  . ferrous sulfate  325 mg Oral BID WC  . insulin aspart  0-5 Units Subcutaneous QHS  . insulin aspart  0-9 Units Subcutaneous TID WC  . insulin aspart  8 Units Subcutaneous TID WC  . insulin glargine  25 Units Subcutaneous Daily  . ipratropium-albuterol  3 mL Nebulization BID  . magnesium oxide  400 mg Oral Daily  . mometasone-formoterol  2 puff Inhalation BID  . multivitamin with minerals  1 tablet Oral Daily  . nortriptyline  50 mg Oral QHS  . oxybutynin  10 mg Oral Daily  . pantoprazole  40 mg Oral Daily  . predniSONE  40 mg Oral Q breakfast  . Ensure Max Protein  11 oz Oral QHS  . sodium chloride flush  3 mL Intravenous Q12H  . sodium chloride flush  3 mL Intravenous Q12H  . vitamin B-12  1,000 mcg Oral Daily   Continuous Infusions: . sodium chloride     PRN Meds: sodium chloride, acetaminophen **OR** acetaminophen, albuterol, bisacodyl, HYDROcodone-acetaminophen, ondansetron (ZOFRAN) IV, senna-docusate, sodium chloride flush   Vital Signs    Vitals:   01/11/21 2050 01/11/21 2341 01/12/21 0550 01/12/21 0714  BP:  117/70 131/79   Pulse:   89   Resp:      Temp:   (!) 97.2 F (36.2 C)   TempSrc:   Axillary   SpO2: 92%  96% 93%  Weight:      Height:         Intake/Output Summary (Last 24 hours) at 01/12/2021 0745 Last data filed at 01/12/2021 0553 Gross per 24 hour  Intake 288.87 ml  Output 350 ml  Net -61.13 ml   Last 3 Weights 01/11/2021 01/10/2021 01/09/2021  Weight (lbs) 167 lb 12.3 oz 174 lb 6.1 oz 174 lb 12.8 oz  Weight (kg) 76.1 kg 79.1 kg 79.289 kg      Telemetry    Sinus rhythm HR 80-90s - Personally Reviewed  ECG    Sinus rhythm with PACs HR 85 - Personally Reviewed  Physical Exam   GEN: No acute distress.   Neck: No JVD Cardiac: RRR, + murmur Respiratory: Clear to auscultation bilaterally. GI: Soft, nontender, non-distended  MS: chronic skin changes, no edema Neuro:  Nonfocal  Psych: Normal affect  Right radial site C/D/I, covered, no bleeding  Labs    High Sensitivity Troponin:   Recent Labs  Lab 01/07/21 2300 01/08/21 1227 01/08/21 2108 01/09/21 0816 01/09/21 1017  TROPONINIHS 33* 343* 1,258* 1,140* 982*      Chemistry Recent Labs  Lab 01/07/21 1408 01/07/21 2336 01/08/21 0339 01/09/21 0234 01/10/21 0304 01/11/21 0219 01/11/21 0904 01/11/21 0918 01/12/21 0256  NA 131*   < > 134*   < >  134* 133* 135 135 133*  K 5.0   < > 4.9   < > 3.9 3.8 4.1 3.9 5.0  CL 103  --  106   < > 98 93*  --   --  100  CO2 15*  --  18*   < > 23 26  --   --  25  GLUCOSE 150*  --  287*   < > 308* 268*  --   --  216*  BUN 18  --  15   < > 26* 28*  --   --  26*  CREATININE 1.25*  --  1.10   < > 1.21 1.15  --   --  0.99  CALCIUM 9.0  --  8.5*   < > 9.0 9.2  --   --  8.9  PROT 6.5  --  5.7*  --   --   --   --   --  5.5*  ALBUMIN 3.4*  --  2.9*  --   --   --   --   --  3.0*  AST 20  --  14*  --   --   --   --   --  20  ALT 17  --  16  --   --   --   --   --  21  ALKPHOS 54  --  47  --   --   --   --   --  50  BILITOT 0.4  --  0.7  --   --   --   --   --  0.7  GFRNONAA >60  --  >60   < > >60 >60  --   --  >60  ANIONGAP 13  --  10   < > 13 14  --   --  8   < > = values in this interval not displayed.      Hematology Recent Labs  Lab 01/10/21 0304 01/11/21 0219 01/11/21 0904 01/11/21 0918 01/12/21 0256  WBC 12.2* 12.6*  --   --  10.6*  RBC 4.58 4.88  --   --  4.55  HGB 11.2* 11.4* 12.6* 11.9* 10.7*  HCT 35.1* 37.3* 37.0* 35.0* 35.5*  MCV 76.6* 76.4*  --   --  78.0*  MCH 24.5* 23.4*  --   --  23.5*  MCHC 31.9 30.6  --   --  30.1  RDW 21.5* 21.4*  --   --  21.2*  PLT 359 362  --   --  327    BNP Recent Labs  Lab 01/07/21 1408 01/10/21 0304  BNP 337.4* 346.0*     DDimer  Recent Labs  Lab 01/08/21 2108  DDIMER 0.89*     Radiology    CARDIAC CATHETERIZATION  Result Date: 01/11/2021 1.  Severe calcific single-vessel coronary artery disease involving a severely calcified eccentric proximal LAD lesion 2.  Patent left main, patent left circumflex with moderate nonobstructive stenosis, patent RCA with moderate nonobstructive stenosis 3.  Moderate pulmonary hypertension with normal pulmonary capillary wedge pressure/LVEDP 4.  Unsuccessful orbital atherectomy due to inability to fully treat the lesion. Recommendations: Recommend another attempt at PCI from femoral access once the patient recovers from this procedure.  Hopefully femoral access will give better guide support and allow for completion of the procedure.  I think that the lack of contralateral aortic support and steep angulation of the left main/LAD origin contributed to technical challenges with  this case.  Plan reviewed with the patient.  Continue aspirin and clopidogrel.   Cardiac Studies   Left heart cath 01/11/21: 1.  Severe calcific single-vessel coronary artery disease involving a severely calcified eccentric proximal LAD lesion 2.  Patent left main, patent left circumflex with moderate nonobstructive stenosis, patent RCA with moderate nonobstructive stenosis 3.  Moderate pulmonary hypertension with normal pulmonary capillary wedge pressure/LVEDP 4.  Unsuccessful orbital atherectomy due to inability to fully  treat the lesion.  Recommendations: Recommend another attempt at PCI from femoral access once the patient recovers from this procedure.  Hopefully femoral access will give better guide support and allow for completion of the procedure.  I think that the lack of contralateral aortic support and steep angulation of the left main/LAD origin contributed to technical challenges with this case.  Plan reviewed with the patient.  Continue aspirin and clopidogrel.   Echo 01/09/21: 1. Left ventricular ejection fraction, by estimation, is 60 to 65%. The  left ventricle has normal function. The left ventricle has no regional  wall motion abnormalities. There is mild concentric left ventricular  hypertrophy and moderate basal septal  hypertrophy.  2. Right ventricular systolic function is moderately reduced. The right  ventricular size is moderately enlarged. There is severely elevated  pulmonary artery systolic pressure. The estimated right ventricular  systolic pressure is 47.4 mmHg.  3. The mitral valve is degenerative. No evidence of mitral valve  regurgitation. No evidence of mitral stenosis.  4. The inferior vena cava is dilated in size with >50% respiratory  variability, suggesting right atrial pressure of 8 mmHg.  5. The aortic valve is tricuspid. There is moderate calcification of the  aortic valve primarily of the left coronary cusp. There is moderate  thickening of the aortic valve. Aortic valve regurgitation is mild. Mild  to moderate aortic valve  sclerosis/calcification is present, without any evidence of aortic  stenosis.  6. There is mild dilatation at the level of the sinuses of Valsalva,  measuring 37 mm. There is mild dilatation of the ascending aorta,  measuring 42 mm.  7. Tricuspid valve regurgitation is mild to moderate.  8. Right atrial size was moderately dilated.   Patient Profile     75 y.o. 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 Cardiology consulted for elevated troponin.  Assessment & Plan    Acute on chronic hypoxic respiratory failure - baseline O2 is 4L, required 10L on presentation because she was out of oxygen - COPD exacerbation per primary - CT with COPD and enlarging pleural effusions - per primary   Moderately reduced RV function Severe pulmonary arterial hypertension Mild to moderate TR - EF 60-65%, mild LVH, severely elevated PA pressure - has received 40 mg IV lasix x 4 doses, overall net negative 3.6 L, weight is 167 lbs from 174 lbs on admission - K 5.0, normal renal function - appears euvolemic   NSTEMI CAD - hs troponin 49 --> 39 --> 33 --> 343 --> 1258 --< 1140 --> 982 - heart cath yesterday showed 90% proximal LAD stenosis with unsuccessful atherectomy - will re-attempt atherectomy from femoral approach tomorrow - no chest pain - continue ASA and plavix   Paroxysmal atrial  tachycardia - cardizem 60 mg q6hr - heart rates have been controlled - no indication for anticoagulation - telemetry with sinus rhythm and PACs - consider consolidating cardizem to 240 mg daily   Hx of 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   Thoracic aorta aneurysm - stable - 4.8 cm on CT  09/2020 --> 4.7 cm on CT this admission (measured 4.2 cm on echo) - will need monitoring - [facility] - good BP control   Hypertension - pressure well-controlled - continue BB and cardizem --> consolidate cardizem as above   DM - insulin, per  primary          For questions or updates, please contact [facility] Please consult [email or web address] for contact info under        Signed, Ledora Bottcher, PA  01/12/2021, 7:45 AM    Attending Note:   The patient was seen and examined.  Agree with assessment and plan as noted above.  Changes made to the above note as needed.  Patient seen and independently examined with Doreene Adas, PA .   We discussed all aspects of the encounter. I agree with the assessment and plan as stated above.  1.   CAD :  Plan is for atherectomy tomorrow  We have discussed the risks, benefits,option.  He understands and agrees to proceed    2.  Pulmonary HTN:  Due to COPD  3.    I have spent a total of 40 minutes with patient reviewing hospital  notes , telemetry, EKGs, labs and examining patient as well as establishing an assessment and plan that was discussed with the patient. > 50% of time was spent in direct patient care.    Thayer Headings, Brooke Bonito., MD, [facility] 01/12/2021, 11:33 AM [address]  [place] Pager [phone]

## 2021-01-11 NOTE — Progress Notes (Signed)
PROGRESS NOTE    Brandon [name]  JKD:[id number] DOB: 04-10-1946 DOA: 01/07/2021 PCP: Pcp, No   Brief Narrative:  HPI per Dr. Toy Baker on 01/07/21 Brandon [name] is a 75 y.o. 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 Underwent a R/LHC today and showed "Severe calcific single-vessel coronary artery disease involving a severely calcified eccentric proximal LAD lesion 2.  Patent left main, patent left circumflex with moderate nonobstructive stenosis, patent RCA with moderate nonobstructive stenosis 3.  Moderate pulmonary hypertension with normal pulmonary capillary wedge pressure/LVEDP 4.  Unsuccessful orbital atherectomy due to inability to fully treat the lesion." Now Cardiology will recommend repeating Cath from a femoral site.   Assessment & Plan:   Active Problems:   Essential hypertension   Type 2 diabetes mellitus (HCC)   CAD (coronary artery disease)   GERD (gastroesophageal reflux disease)   Acute on chronic respiratory failure with hypoxemia (HCC)   Tobacco abuse   Hyponatremia   Elevated troponin   COPD with acute exacerbation (HCC)   History of CVA (cerebrovascular accident)   COPD exacerbation (HCC)   Hypomagnesemia   Pressure injury of skin   Malnutrition of moderate degree  Acute on chronic Hypoxic Respiratory Failure Possibly COPD with acute exacerbation -On 4 L of O2 at baseline, required about 10L on  presentation -Currently afebrile, with leukocytosis (steroids) -Chest x-ray with evidence of emphysematous changes -D-dimer elevated, CTA chest with no evidence of PE -Covid, flu panel negative -Continue Duoneb 3 mL Neb BID, inhalers with Dulera 2 piff IH , steroids (Tapering and now on 40 mg po daily for 2 more days), Mucinex -Continue ceftriaxone for total of 5 days -SpO2: 98 % O2 Flow Rate (L/min): 2 L/min -Continue supplemental oxygen, BiPAP as needed -Continue to Monitor closely -Given IV Lasix 40 mg BID x 4 Doses  NSTEMI Severe Pulmonary Hypertension Elevated Troponin -EKG with no acute ST changes -Tropnin went from 49 -> 39 -> 33 -> 343 -> 1258 -> 1140 -Echo showed EF of 60 to 65%, no regional wall motion abnormality, noted PAH pressure 64.6 mmHg -Cardiology on board, plan for cardiac cath on 01/11/2021 but will need to be done again on Friday from a Femoral Site -Radial Cath showed "1.  Severe calcific single-vessel coronary artery disease involving a severely calcified eccentric proximal LAD lesion 2.  Patent left main, patent left circumflex with moderate nonobstructive stenosis, patent RCA with moderate nonobstructive stenosis 3.  Moderate pulmonary hypertension with normal pulmonary capillary wedge pressure/LVEDP 4.  Unsuccessful orbital atherectomy due to inability to fully treat the lesion." -Lasix now stopped; Cardizem (as rec by cardiology) -C/w Telemetry  Diabetes mellitus type 2 -Last A1c 6.4 on 01/07/2021, poor control, currently 2/2 steroid use -C/w Sensistive Novolog SSI AC/HS, Lantus 25 mg sq Daily and Novolog 8 units sq TIDwm -C/w Accu-Cheks, hypoglycemic protocol -Diabetes coordinator, appreciate recs  Hypomagnesemia -Mag Level was 1.8 -Replete with IV Mag Sulfate 1  grams and continue with po Mag Oxide 400 mg po Daily  -Continue to Monitor and Replete as Necessary -Repeat Mag Level in the AM   Microcytic anemia/vitamin B12 deficiency -Hemoglobin somewhat  around baseline -Anemia panel showed iron 14, sats 4, B12 141 -Hgb/Hct went from 11.2/35.1 -> 11.4/37.3 -Oral iron and vitamin B12 supplementation -Continue to Monitor and Trend    Hypertension -BP stable and last BP was 139/77 -Continue Diltiazem 60 mg po q6h  -Continue to Monitor BP per Protocol   History of CAD/CVA -Currently chest pain-free -Continue Aspirin 81 mg po daily, Atorvastatin 40 mg po Daily, Clopidogrel 75 mg po daily,  -He was also switched to Bisoprolol 5 mg po Daily due to COPD exacerbation  GERD -Continue Pantoprazole 40 mg po Daily   Moderate Malnutrition in the Context of Chronic illness -Nutritionist consulted for further evaluation  -Estimated body mass index is 19.39 kg/m as calculated from the following:   Height as of this encounter: 6\' 6"  (1.981 m).   Weight as of this encounter: 76.1 kg. -C/w Glucerna Shake po BID, Ensure Max po Daily and MVI + Minerals Daily   DVT prophylaxis: Enoxaparin 40 mg sq q24h Code Status: FULL CODE Family Communication: No family present at bedside  Disposition Plan: Pending further Cardiac Clearance   Status is: Inpatient  Remains inpatient appropriate because:Unsafe d/c plan, IV treatments appropriate due to intensity of illness or inability to take PO and Inpatient level of care appropriate due to severity of illness   Dispo: The patient is from: Home              Anticipated d/c is to: TBD              Anticipated d/c date is: 2 days              Patient currently is not medically stable to d/c.   Consultants:   Cardiology   Procedures:  ECHOCARDIOGRAM IMPRESSIONS    1. Left ventricular ejection fraction, by estimation, is 60 to 65%. The  left ventricle has normal function. The left ventricle has no regional  wall motion abnormalities. There is mild concentric left ventricular  hypertrophy and moderate basal septal  hypertrophy.  2. Right ventricular systolic function is moderately reduced. The  right  ventricular size is moderately enlarged. There is severely elevated  pulmonary artery systolic pressure. The estimated right ventricular  systolic pressure is Q000111Q mmHg.  3. The mitral valve is degenerative. No evidence of mitral valve  regurgitation. No evidence of mitral stenosis.  4. The inferior vena cava is dilated in size with >50% respiratory  variability, suggesting right atrial pressure of 8 mmHg.  5. The aortic valve is tricuspid. There is moderate calcification of the  aortic valve primarily of the left coronary cusp. There is moderate  thickening of the aortic valve. Aortic valve regurgitation is mild. Mild  to moderate aortic valve  sclerosis/calcification is present, without any evidence of aortic  stenosis.  6. There is mild dilatation at the level of the sinuses of Valsalva,  measuring 37 mm. There is mild dilatation of the ascending aorta,  measuring 42 mm.  7. Tricuspid valve regurgitation is mild to moderate.  8. Right atrial size was moderately dilated.   FINDINGS  Left Ventricle: Left ventricular ejection fraction, by estimation, is 65  to 70%. The left ventricle has normal function. The left ventricle has no  regional wall motion abnormalities. The left ventricular internal cavity  size was  normal in size. There is  mild concentric left ventricular hypertrophy. Left ventricular diastolic  function could not be evaluated.   Right Ventricle: The right ventricular size is moderately enlarged. No  increase in right ventricular wall thickness. Right ventricular systolic  function is moderately reduced. There is severely elevated pulmonary  artery systolic pressure. The tricuspid  regurgitant velocity is 3.76 m/s, and with an assumed right atrial  pressure of 8 mmHg, the estimated right ventricular systolic pressure is  Q000111Q mmHg.   Left Atrium: Left atrial size was normal in size.   Right Atrium: Right atrial size was moderately dilated.    Pericardium: There is no evidence of pericardial effusion.   Mitral Valve: The mitral valve is degenerative in appearance. There is  mild thickening of the mitral valve leaflet(s). There is mild  calcification of the mitral valve leaflet(s). Mild to moderate mitral  annular calcification. No evidence of mitral valve  regurgitation. No evidence of mitral valve stenosis.   Tricuspid Valve: The tricuspid valve is normal in structure. Tricuspid  valve regurgitation is mild to moderate. No evidence of tricuspid  stenosis.   Aortic Valve: The aortic valve is tricuspid. There is moderate  calcification of the aortic valve. There is moderate thickening of the  aortic valve. Aortic valve regurgitation is mild. Mild to moderate aortic  valve sclerosis/calcification is present,  without any evidence of aortic stenosis.   Pulmonic Valve: The pulmonic valve was normal in structure. Pulmonic valve  regurgitation is trivial. No evidence of pulmonic stenosis.   Aorta: The aortic root is normal in size and structure. There is mild  dilatation at the level of the sinuses of Valsalva, measuring 37 mm. There  is mild dilatation of the ascending aorta, measuring 42 mm.   Venous: The inferior vena cava is dilated in size with greater than 50%  respiratory variability, suggesting right atrial pressure of 8 mmHg.   IAS/Shunts: No atrial level shunt detected by color flow Doppler.     LEFT VENTRICLE  PLAX 2D  LVIDd:     4.10 cm  LVIDs:     2.60 cm  LV PW:     1.20 cm  LV IVS:    1.30 cm  LVOT diam:   2.30 cm  LV SV:     88  LV SV Index:  41  LVOT Area:   4.15 cm     RIGHT VENTRICLE       IVC  RV S prime:   13.60 cm/s IVC diam: 2.50 cm  TAPSE (M-mode): 1.6 cm   LEFT ATRIUM       Index    RIGHT ATRIUM      Index  LA diam:    4.20 cm 1.97 cm/m RA Area:   19.50 cm  LA Vol (A2C):  50.5 ml 23.69 ml/m RA Volume:  55.30 ml 25.94  ml/m  LA Vol (A4C):  71.4 ml 33.49 ml/m  LA Biplane Vol: 64.9 ml 30.44 ml/m  AORTIC VALVE  LVOT Vmax:  96.00 cm/s  LVOT Vmean: 66.400 cm/s  LVOT VTI:  0.212 m    AORTA  Ao Root diam: 3.70 cm  Ao Asc diam: 4.20 cm   TRICUSPID VALVE  TR Peak grad:  56.6 mmHg  TR Vmax:    376.00 cm/s    SHUNTS  Systemic VTI: 0.21 m  Systemic Diam: 2.30 cm   CARDIAC CATHETERIZATION 1.  Severe calcific single-vessel coronary artery disease involving a severely calcified eccentric proximal LAD  lesion 2.  Patent left main, patent left circumflex with moderate nonobstructive stenosis, patent RCA with moderate nonobstructive stenosis 3.  Moderate pulmonary hypertension with normal pulmonary capillary wedge pressure/LVEDP 4.  Unsuccessful orbital atherectomy due to inability to fully treat the lesion.  Recommendations: Recommend another attempt at PCI from femoral access once the patient recovers from this procedure.  Hopefully femoral access will give better guide support and allow for completion of the procedure.  I think that the lack of contralateral aortic support and steep angulation of the left main/LAD origin contributed to technical challenges with this case.  Plan reviewed with the patient.  Continue aspirin and clopidogrel.   Antimicrobials:  Anti-infectives (From admission, onward)   Start     Dose/Rate Route Frequency Ordered Stop   01/07/21 2330  cefTRIAXone (ROCEPHIN) 1 g in sodium chloride 0.9 % 100 mL IVPB        1 g 200 mL/hr over 30 Minutes Intravenous Every 24 hours 01/07/21 2325 01/12/21 2159       Subjective: Seen and examined at bedside and had come back from his Cath and had some oozing from his Raidal Cath site. He denied any Chest Pain or SOB. No lightheadedness. States he had some pain in his Right radial site. No other concerns or complaints or this time.   Objective: Vitals:   01/11/21 0747 01/11/21 0832 01/11/21 1114 01/11/21 1422  BP:   125/82 139/77   Pulse:   79 85  Resp:   17 17  Temp:   (!) 97.4 F (36.3 C) (!) 97.5 F (36.4 C)  TempSrc:   Oral Oral  SpO2: 95% 99%  98%  Weight:      Height:        Intake/Output Summary (Last 24 hours) at 01/11/2021 1545 Last data filed at 01/11/2021 1518 Gross per 24 hour  Intake 525.87 ml  Output 2375 ml  Net -1849.13 ml   Filed Weights   01/09/21 0600 01/10/21 0655 01/11/21 0511  Weight: 79.3 kg 79.1 kg 76.1 kg   Examination: Physical Exam:  Constitutional: WN/WD elderly Caucasian male in NAD and appears calm and comfortable Eyes: Lids and conjunctivae normal, sclerae anicteric  ENMT: External Ears, Nose appear normal. Grossly normal hearing.  Neck: Appears normal, supple, no cervical masses, normal ROM, no appreciable thyromegaly; no JVD Respiratory: Diminished to auscultation bilaterally with coarse breath sounds, no wheezing, rales, rhonchi or crackles. Normal respiratory effort and patient is not tachypenic. No accessory muscle use. Wearing Supplemental O2 via [place] Cardiovascular: RRR, no murmurs / rubs / gallops. S1 and S2 auscultated. No extremity edema. Right Radial Cath site has a TR Band with some blood noted Abdomen: Soft, non-tender, non-distended. Bowel sounds positive.  GU: Deferred. Musculoskeletal: No clubbing / cyanosis of digits/nails. No joint deformity upper and lower extremities.  Skin: No rashes, lesions, ulcers on a limited skin evaluation. No induration; Warm and dry.  Neurologic: CN 2-12 grossly intact with no focal deficits. Romberg sign and cerebellar reflexes not assessed.  Psychiatric: Normal judgment and insight. Alert and oriented x 3. Normal mood and appropriate affect.   Data Reviewed: I have personally reviewed following labs and imaging studies  CBC: Recent Labs  Lab 01/08/21 0339 01/08/21 0755 01/09/21 0234 01/10/21 0304 01/11/21 0219  WBC 5.1 5.7 8.3 12.2* 12.6*  NEUTROABS 4.4 4.9 7.5 10.5* 10.8*  HGB 10.7* 10.9* 11.1* 11.2* 11.4*  HCT 34.0*  37.6* 34.4* 35.1* 37.3*  MCV 78.2* 80.9 76.6* 76.6* 76.4*  PLT 342  328 338 359 203   Basic Metabolic Panel: Recent Labs  Lab 01/07/21 1408 01/07/21 1638 01/07/21 2336 01/08/21 0339 01/09/21 0234 01/10/21 0304 01/11/21 0219  NA 131*  --  134* 134* 135 134* 133*  K 5.0  --  4.8 4.9 4.2 3.9 3.8  CL 103  --   --  106 103 98 93*  CO2 15*  --   --  18* 21* 23 26  GLUCOSE 150*  --   --  287* 308* 308* 268*  BUN 18  --   --  15 20 26* 28*  CREATININE 1.25*  --   --  1.10 1.04 1.21 1.15  CALCIUM 9.0  --   --  8.5* 9.0 9.0 9.2  MG  --  1.5*  --  1.6* 1.9  --  1.8  PHOS  --   --   --  4.5  --   --   --    GFR: Estimated Creatinine Clearance: 59.7 mL/min (by C-G formula based on SCr of 1.15 mg/dL). Liver Function Tests: Recent Labs  Lab 01/07/21 1408 01/08/21 0339  AST 20 14*  ALT 17 16  ALKPHOS 54 47  BILITOT 0.4 0.7  PROT 6.5 5.7*  ALBUMIN 3.4* 2.9*   No results for input(s): LIPASE, AMYLASE in the last 168 hours. No results for input(s): AMMONIA in the last 168 hours. Coagulation Profile: No results for input(s): INR, PROTIME in the last 168 hours. Cardiac Enzymes: No results for input(s): CKTOTAL, CKMB, CKMBINDEX, TROPONINI in the last 168 hours. BNP (last 3 results) No results for input(s): PROBNP in the last 8760 hours. HbA1C: No results for input(s): HGBA1C in the last 72 hours. CBG: Recent Labs  Lab 01/09/21 2210 01/10/21 0819 01/10/21 1130 01/10/21 1624 01/10/21 2132  GLUCAP 325* 213* 221* 203* 226*   Lipid Profile: No results for input(s): CHOL, HDL, LDLCALC, TRIG, CHOLHDL, LDLDIRECT in the last 72 hours. Thyroid Function Tests: No results for input(s): TSH, T4TOTAL, FREET4, T3FREE, THYROIDAB in the last 72 hours. Anemia Panel: Recent Labs    01/09/21 0234  VITAMINB12 141*  FOLATE 7.3  FERRITIN 30  TIBC 358  IRON 14*   Sepsis Labs: No results for input(s): PROCALCITON, LATICACIDVEN in the last 168 hours.  Recent Results (from the past 240  hour(s))  Resp Panel by RT-PCR (Flu A&B, Covid) Nasopharyngeal Swab     Status: None   Collection Time: 01/07/21  3:41 PM   Specimen: Nasopharyngeal Swab; Nasopharyngeal(NP) swabs in vial transport medium  Result Value Ref Range Status   SARS Coronavirus 2 by RT PCR NEGATIVE NEGATIVE Final    Comment: (NOTE) SARS-CoV-2 target nucleic acids are NOT DETECTED.  The SARS-CoV-2 RNA is generally detectable in upper respiratory specimens during the acute phase of infection. The lowest concentration of SARS-CoV-2 viral copies this assay can detect is 138 copies/mL. A negative result does not preclude SARS-Cov-2 infection and should not be used as the sole basis for treatment or other patient management decisions. A negative result may occur with  improper specimen collection/handling, submission of specimen other than nasopharyngeal swab, presence of viral mutation(s) within the areas targeted by this assay, and inadequate number of viral copies(<138 copies/mL). A negative result must be combined with clinical observations, patient history, and epidemiological information. The expected result is Negative.  Fact Sheet for Patients:  EntrepreneurPulse.com.au  Fact Sheet for Healthcare Providers:  IncredibleEmployment.be  This test is no t yet approved or cleared by the [place] FDA  and  has been authorized for detection and/or diagnosis of SARS-CoV-2 by FDA under an Emergency Use Authorization (EUA). This EUA will remain  in effect (meaning this test can be used) for the duration of the COVID-19 declaration under Section 564(b)(1) of the Act, 21 U.S.C.section 360bbb-3(b)(1), unless the authorization is terminated  or revoked sooner.       Influenza A by PCR NEGATIVE NEGATIVE Final   Influenza B by PCR NEGATIVE NEGATIVE Final    Comment: (NOTE) The Xpert Xpress SARS-CoV-2/FLU/RSV plus assay is intended as an aid in the diagnosis of influenza from  Nasopharyngeal swab specimens and should not be used as a sole basis for treatment. Nasal washings and aspirates are unacceptable for Xpert Xpress SARS-CoV-2/FLU/RSV testing.  Fact Sheet for Patients: EntrepreneurPulse.com.au  Fact Sheet for Healthcare Providers: IncredibleEmployment.be  This test is not yet approved or cleared by the [place] FDA and has been authorized for detection and/or diagnosis of SARS-CoV-2 by FDA under an Emergency Use Authorization (EUA). This EUA will remain in effect (meaning this test can be used) for the duration of the COVID-19 declaration under Section 564(b)(1) of the Act, 21 U.S.C. section 360bbb-3(b)(1), unless the authorization is terminated or revoked.  Performed at [facility], [place] [address] [place], [place] [address]   MRSA PCR Screening     Status: None   Collection Time: 01/09/21 12:17 AM   Specimen: Nasal Mucosa; Nasopharyngeal  Result Value Ref Range Status   MRSA by PCR NEGATIVE NEGATIVE Final    Comment:        The GeneXpert MRSA Assay (FDA approved for NASAL specimens only), is one component of a comprehensive MRSA colonization surveillance program. It is not intended to diagnose MRSA infection nor to guide or monitor treatment for MRSA infections. Performed at [facility], [place] [address], [place],  [address]      RN Pressure Injury Documentation: Pressure Injury 01/08/21 Ear Left Stage 2 -  Partial thickness loss of dermis presenting as a shallow open injury with a red, pink wound bed without slough. Under Oxygen Tubing from Home O2 (Active)  01/08/21 1800  Location: Ear  Location Orientation: Left  Staging: Stage 2 -  Partial thickness loss of dermis presenting as a shallow open injury with a red, pink wound bed without slough.  Wound Description (Comments): Under Oxygen Tubing from Home O2  Present on Admission: Yes     Estimated body mass index is 19.39  kg/m as calculated from the following:   Height as of this encounter: 6\' 6"  (1.981 m).   Weight as of this encounter: 76.1 kg.  Malnutrition Type:  Nutrition Problem: Moderate Malnutrition Etiology: chronic illness (COPD)  Malnutrition Characteristics:  Signs/Symptoms: mild fat depletion,moderate fat depletion,mild muscle depletion,moderate muscle depletion  Nutrition Interventions:  Interventions: MVI,Ensure Enlive (each supplement provides 350kcal and 20 grams of protein)   Radiology Studies: CARDIAC CATHETERIZATION  Result Date: 01/11/2021 1.  Severe calcific single-vessel coronary artery disease involving a severely calcified eccentric proximal LAD lesion 2.  Patent left main, patent left circumflex with moderate nonobstructive stenosis, patent RCA with moderate nonobstructive stenosis 3.  Moderate pulmonary hypertension with normal pulmonary capillary wedge pressure/LVEDP 4.  Unsuccessful orbital atherectomy due to inability to fully treat the lesion. Recommendations: Recommend another attempt at PCI from femoral access once the patient recovers from this procedure.  Hopefully femoral access will give better guide support and allow for completion of the procedure.  I think that the lack  of contralateral aortic support and steep angulation of the left main/LAD origin contributed to technical challenges with this case.  Plan reviewed with the patient.  Continue aspirin and clopidogrel.  Scheduled Meds: . aspirin  81 mg Oral Daily  . atorvastatin  40 mg Oral QHS  . bisoprolol  5 mg Oral Daily  . clopidogrel  75 mg Oral Daily  . diltiazem  60 mg Oral Q6H  . enoxaparin (LOVENOX) injection  40 mg Subcutaneous Q24H  . feeding supplement (GLUCERNA SHAKE)  237 mL Oral BID BM  . ferrous sulfate  325 mg Oral BID WC  . insulin aspart  0-5 Units Subcutaneous QHS  . insulin aspart  0-9 Units Subcutaneous TID WC  . insulin aspart  8 Units Subcutaneous TID WC  . insulin glargine  25 Units  Subcutaneous Daily  . ipratropium-albuterol  3 mL Nebulization BID  . magnesium oxide  400 mg Oral Daily  . mometasone-formoterol  2 puff Inhalation BID  . multivitamin with minerals  1 tablet Oral Daily  . nortriptyline  50 mg Oral QHS  . oxybutynin  10 mg Oral Daily  . pantoprazole  40 mg Oral Daily  . predniSONE  40 mg Oral Q breakfast  . Ensure Max Protein  11 oz Oral QHS  . sodium chloride flush  3 mL Intravenous Q12H  . sodium chloride flush  3 mL Intravenous Q12H  . vitamin B-12  1,000 mcg Oral Daily   Continuous Infusions: . sodium chloride    . sodium chloride 1 mL/kg/hr (01/11/21 1317)  . cefTRIAXone (ROCEPHIN)  IV 1 g (01/10/21 2334)    LOS: 4 days   Kerney Elbe, DO [facility] PAGER is on [place]  If 7PM-7AM, please contact night-coverage [email or web address]

## 2021-01-11 NOTE — Progress Notes (Signed)
PT Cancellation Note  Patient Details Name: Brandon [name] MRN: [id number] DOB: 03/20/1946   Cancelled Treatment:    Reason Eval/Treat Not Completed: Patient at procedure or test/unavailable (cath).  Wyona Almas, PT, DPT Acute Rehabilitation Services Pager [phone] Office [phone]    Deno Etienne 01/11/2021, 10:16 AM

## 2021-01-12 ENCOUNTER — Encounter (HOSPITAL_COMMUNITY): Payer: Self-pay | Admitting: Cardiovascular Disease

## 2021-01-12 DIAGNOSIS — I2510 Atherosclerotic heart disease of native coronary artery without angina pectoris: Secondary | ICD-10-CM | POA: Diagnosis not present

## 2021-01-12 DIAGNOSIS — E1165 Type 2 diabetes mellitus with hyperglycemia: Secondary | ICD-10-CM

## 2021-01-12 DIAGNOSIS — R778 Other specified abnormalities of plasma proteins: Secondary | ICD-10-CM | POA: Diagnosis not present

## 2021-01-12 DIAGNOSIS — J441 Chronic obstructive pulmonary disease with (acute) exacerbation: Secondary | ICD-10-CM | POA: Diagnosis not present

## 2021-01-12 DIAGNOSIS — I2583 Coronary atherosclerosis due to lipid rich plaque: Secondary | ICD-10-CM | POA: Diagnosis not present

## 2021-01-12 DIAGNOSIS — Z794 Long term (current) use of insulin: Secondary | ICD-10-CM

## 2021-01-12 DIAGNOSIS — J9621 Acute and chronic respiratory failure with hypoxia: Secondary | ICD-10-CM | POA: Diagnosis not present

## 2021-01-12 LAB — CBC WITH DIFFERENTIAL/PLATELET
Abs Immature Granulocytes: 0.06 10*3/uL (ref 0.00–0.07)
Basophils Absolute: 0 10*3/uL (ref 0.0–0.1)
Basophils Relative: 0 %
Eosinophils Absolute: 0 10*3/uL (ref 0.0–0.5)
Eosinophils Relative: 0 %
HCT: 35.5 % — ABNORMAL LOW (ref 39.0–52.0)
Hemoglobin: 10.7 g/dL — ABNORMAL LOW (ref 13.0–17.0)
Immature Granulocytes: 1 %
Lymphocytes Relative: 5 %
Lymphs Abs: 0.5 10*3/uL — ABNORMAL LOW (ref 0.7–4.0)
MCH: 23.5 pg — ABNORMAL LOW (ref 26.0–34.0)
MCHC: 30.1 g/dL (ref 30.0–36.0)
MCV: 78 fL — ABNORMAL LOW (ref 80.0–100.0)
Monocytes Absolute: 0.3 10*3/uL (ref 0.1–1.0)
Monocytes Relative: 3 %
Neutro Abs: 9.7 10*3/uL — ABNORMAL HIGH (ref 1.7–7.7)
Neutrophils Relative %: 91 %
Platelets: 327 10*3/uL (ref 150–400)
RBC: 4.55 MIL/uL (ref 4.22–5.81)
RDW: 21.2 % — ABNORMAL HIGH (ref 11.5–15.5)
WBC: 10.6 10*3/uL — ABNORMAL HIGH (ref 4.0–10.5)
nRBC: 0 % (ref 0.0–0.2)

## 2021-01-12 LAB — COMPREHENSIVE METABOLIC PANEL
ALT: 21 U/L (ref 0–44)
AST: 20 U/L (ref 15–41)
Albumin: 3 g/dL — ABNORMAL LOW (ref 3.5–5.0)
Alkaline Phosphatase: 50 U/L (ref 38–126)
Anion gap: 8 (ref 5–15)
BUN: 26 mg/dL — ABNORMAL HIGH (ref 8–23)
CO2: 25 mmol/L (ref 22–32)
Calcium: 8.9 mg/dL (ref 8.9–10.3)
Chloride: 100 mmol/L (ref 98–111)
Creatinine, Ser: 0.99 mg/dL (ref 0.61–1.24)
GFR, Estimated: >60 mL/min (ref >60)
Glucose, Bld: 216 mg/dL — ABNORMAL HIGH (ref 70–99)
Potassium: 5 mmol/L (ref 3.5–5.1)
Sodium: 133 mmol/L — ABNORMAL LOW (ref 135–145)
Total Bilirubin: 0.7 mg/dL (ref 0.3–1.2)
Total Protein: 5.5 g/dL — ABNORMAL LOW (ref 6.5–8.1)

## 2021-01-12 LAB — GLUCOSE, CAPILLARY
Glucose-Capillary: 182 mg/dL — ABNORMAL HIGH (ref 70–99)
Glucose-Capillary: 164 mg/dL — ABNORMAL HIGH (ref 70–99)
Glucose-Capillary: 304 mg/dL — ABNORMAL HIGH (ref 70–99)
Glucose-Capillary: 395 mg/dL — ABNORMAL HIGH (ref 70–99)
Glucose-Capillary: 426 mg/dL — ABNORMAL HIGH (ref 70–99)

## 2021-01-12 LAB — PHOSPHORUS: Phosphorus: 3.5 mg/dL (ref 2.5–4.6)

## 2021-01-12 LAB — MAGNESIUM: Magnesium: 2 mg/dL (ref 1.7–2.4)

## 2021-01-12 MED ORDER — INSULIN ASPART 100 UNIT/ML ~~LOC~~ SOLN: 10.0000 [IU] | Freq: Three times a day (TID) | SUBCUTANEOUS | Status: DC

## 2021-01-12 MED ORDER — INSULIN ASPART 100 UNIT/ML ~~LOC~~ SOLN
8.0000 [IU] | Freq: Once | SUBCUTANEOUS | Status: AC
  Administered 2021-01-12: 8 [IU] via SUBCUTANEOUS

## 2021-01-12 NOTE — Progress Notes (Signed)
Physical Therapy Treatment Patient Details Name: Brandon [name] MRN: [id number] DOB: 1946/09/17 Today's Date: 01/12/2021    History of Present Illness Pt is a 75 year old man admitted on 01/09/20 with SOB. 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    PT Comments    Pt seated in recliner.  He was pleasant and agreeable to PT session.  Noted DOE with gt progression.  SPO2 87%-91% on 6L.   Pt very dyspneic during session and would benefit from trial of rollator next visit to improve endurance and allow for seated rest breaks to recover.     Follow Up Recommendations  Home health PT;Supervision for mobility/OOB     Equipment Recommendations  Other (comment) (rollator if he does not have one- he was unsure if he did or not)    Recommendations for Other Services       Precautions / Restrictions Precautions Precautions: Fall Precaution Comments: no AD needed prev Restrictions Weight Bearing Restrictions: No    Mobility  Bed Mobility               General bed mobility comments: Pt seated in recliner on arrival this session.  Transfers Overall transfer level: Needs assistance Equipment used: Rolling walker (2 wheeled) Transfers: Sit to/from Stand Sit to Stand: Supervision         General transfer comment: Cues for hand placement, noted poor eccentric load back to seated surface.  Ambulation/Gait Ambulation/Gait assistance: Min guard Gait Distance (Feet): 40 Feet (x2 ( standing rest break between trials to rest hips on wall)) Assistive device: Rolling walker (2 wheeled) Gait Pattern/deviations: Step-through pattern;Decreased stride length;Wide base of support;Trunk flexed     General Gait Details: RW for energy conservation, cues to step closer to device.  Would benefit from trialing rollator next session.   Stairs             Wheelchair Mobility    Modified Rankin (Stroke Patients  Only)       Balance   Sitting-balance support: Bilateral upper extremity supported;Feet supported Sitting balance-Leahy Scale: Good       Standing balance-Leahy Scale: Fair                              Cognition Arousal/Alertness: Awake/alert Behavior During Therapy: WFL for tasks assessed/performed Overall Cognitive Status: Impaired/Different from baseline Area of Impairment: Memory                     Memory: Decreased short-term memory                Exercises General Exercises - Lower Extremity Ankle Circles/Pumps: AROM;Both;10 reps;Seated Long Arc Quad: AROM;Both;10 reps;Seated Hip Flexion/Marching: AROM;Both;10 reps;Seated    General Comments        Pertinent Vitals/Pain Pain Assessment: No/denies pain    Home Living                      Prior Function            PT Goals (current goals can now be found in the care plan section) Acute Rehab PT Goals Patient Stated Goal: return home Potential to Achieve Goals: Good Progress towards PT goals: Progressing toward goals    Frequency    Min 3X/week      PT Plan Current plan remains appropriate    Co-evaluation  AM-PAC PT "6 Clicks" Mobility   Outcome Measure  Help needed turning from your back to your side while in a flat bed without using bedrails?: A Little Help needed moving from lying on your back to sitting on the side of a flat bed without using bedrails?: A Little Help needed moving to and from a bed to a chair (including a wheelchair)?: A Little Help needed standing up from a chair using your arms (e.g., wheelchair or bedside chair)?: A Little Help needed to walk in hospital room?: A Little Help needed climbing 3-5 steps with a railing? : A Little 6 Click Score: 18    End of Session Equipment Utilized During Treatment: Gait belt Activity Tolerance: Treatment limited secondary to medical complications (Comment) Patient left: in  chair;with call bell/phone within reach Nurse Communication: Mobility status PT Visit Diagnosis: Unsteadiness on feet (R26.81);Difficulty in walking, not elsewhere classified (R26.2)     Time: 2683-4196 PT Time Calculation (min) (ACUTE ONLY): 21 min  Charges:  $Gait Training: 8-22 mins                     Erasmo Leventhal , PTA Acute Rehabilitation Services Pager [phone] Office [phone]     Talina Pleitez LEILAN BOCHENEK 01/12/2021, 2:45 PM

## 2021-01-12 NOTE — Progress Notes (Signed)
PROGRESS NOTE    Brandon [name]  [id number] DOB: [birth year] DOA: 01/07/2021 PCP: Pcp, No   Brief Narrative:  HPI per Dr. Toy Baker on 01/07/21 Brandon [name] is a 75 y.o. 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 Underwent a R/LHC today and showed "Severe calcific single-vessel coronary artery disease involving a severely calcified eccentric proximal LAD lesion 2.  Patent left main, patent left circumflex with moderate nonobstructive stenosis, patent RCA with moderate nonobstructive stenosis 3.  Moderate pulmonary hypertension with normal pulmonary capillary wedge pressure/LVEDP 4.  Unsuccessful orbital atherectomy due to inability to fully treat the lesion." Now Cardiology will recommend repeating Heart Cath from a femoral site and this will be done 01/13/21.   Assessment & Plan:   Active Problems:   Essential hypertension   Type 2 diabetes mellitus (HCC)   CAD (coronary artery disease)   GERD (gastroesophageal reflux disease)   Acute on chronic respiratory failure with hypoxemia (HCC)   Tobacco abuse   Hyponatremia   Elevated troponin   COPD with acute exacerbation (HCC)   History of CVA (cerebrovascular accident)   COPD exacerbation (HCC)   Hypomagnesemia   Pressure injury of skin   Malnutrition of moderate degree  Acute on chronic Hypoxic Respiratory Failure Possibly COPD with acute exacerbation -On 4 L of O2 at  baseline, required about 10L on presentation -Currently afebrile, with leukocytosis (steroids) -Chest x-ray with evidence of emphysematous changes -D-dimer elevated, CTA chest with no evidence of PE -Covid, flu panel negative -Continue Duoneb 3 mL Neb BID, inhalers with Dulera 2 piff IH , steroids (Tapering and now on 40 mg po daily for 1 more days), Mucinex -Continue ceftriaxone for total of 5 days -SpO2: 98 % O2 Flow Rate (L/min): 6 L/min -Continue supplemental oxygen, BiPAP as needed -Continue to Monitor closely -Given IV Lasix 40 mg BID x 4 Doses and now stopped -Continue to Monitor Respiratory Status carefully  NSTEMI Severe Pulmonary Arterial Hypertension Elevated Troponin Mild to Moderate TR -EKG with no acute ST changes -Tropnin went from 49 -> 39 -> 33 -> 343 -> 1258 -> 1140 -Echo showed EF of 60 to 65%, no regional wall motion abnormality, noted PAH pressure 64.6 mmHg -Cardiology on board, plan for cardiac cath on 01/11/2021 but will need to be done again on Friday from a Femoral Site -Radial Cath showed "1.  Severe calcific single-vessel coronary artery disease involving a severely calcified eccentric proximal LAD lesion 2.  Patent left main, patent left circumflex with moderate nonobstructive stenosis, patent RCA with moderate nonobstructive stenosis 3.  Moderate pulmonary hypertension with normal pulmonary capillary wedge pressure/LVEDP 4.  Unsuccessful orbital atherectomy due to inability to fully treat the lesion." -Lasix now stopped; Cardizem (as rec by cardiology) -C/w Telemetry  Diabetes Mellitus Type 2 -Last A1c 6.4 on 01/07/2021, poor control, currently 2/2 steroid use -C/w Sensistive Novolog SSI AC/HS, Lantus 25 mg sq Daily and Novolog 8 units sq  TIDwm and will go to 10 Units TID if eats at least 50% of his meals -C/w Accu-Cheks, hypoglycemic protocol -Diabetes coordinator, appreciate recs -CBG's ranging from 133-304  Hypomagnesemia -Mag Level is now  2.0 -Replete with IV Mag Sulfate 1 grams yesterday and continue with po Mag Oxide 400 mg po Daily  -Continue to Monitor and Replete as Necessary -Repeat Mag Level in the AM   Microcytic Anemia/Vitamin B12 Deficiency -Hemoglobin somewhat around baseline -Anemia panel showed iron 14, sats 4, B12 141 -Hgb/Hct went from 11.2/35.1 -> 11.4/37.3 -> 10.7/35.5 -Oral iron and vitamin B12 supplementation -Continue to Monitor and Trend  -Repeat CBC in the AM    Hypertension -BP stable and last BP was 131/79 -Continue Diltiazem 60 mg po q6h  -Continue to Monitor BP per Protocol   Leukocytosis -In the setting of Steroid Demargination -Patient's WBC went from 12.2 -> 12.6 -> 10.6 -Continue to Monitor for S/Sx of Infection -Repeat CBC in the AM  History of CAD/CVA -Currently chest pain-free -Continue Aspirin 81 mg po daily, Atorvastatin 40 mg po Daily, Clopidogrel 75 mg po daily,  -He was also switched to Bisoprolol 5 mg po Daily due to COPD exacerbation  GERD -Continue Pantoprazole 40 mg po Daily   Moderate Malnutrition in the Context of Chronic illness -Nutritionist consulted for further evaluation  -Estimated body mass index is 19.39 kg/m as calculated from the following:   Height as of this encounter: 6\' 6"  (1.981 m).   Weight as of this encounter: 76.1 kg. -C/w Glucerna Shake po BID, Ensure Max po Daily and MVI + Minerals Daily   DVT prophylaxis: Enoxaparin 40 mg sq q24h Code Status: FULL CODE Family Communication: No family present at bedside  Disposition Plan: Pending further Cardiac Clearance and Repeat Cath  Status is: Inpatient  Remains inpatient appropriate because:Unsafe d/c plan, IV treatments appropriate due to intensity of illness or inability to take PO and Inpatient level of care appropriate due to severity of illness   Dispo: The patient is from: Home              Anticipated d/c is to: TBD              Anticipated d/c date is: 2 days              Patient  currently is not medically stable to d/c.  Consultants:   Cardiology   Procedures:  ECHOCARDIOGRAM IMPRESSIONS    1. Left ventricular ejection fraction, by estimation, is 60 to 65%. The  left ventricle has normal function. The left ventricle has no regional  wall motion abnormalities. There is mild concentric left ventricular  hypertrophy and moderate basal septal  hypertrophy.  2. Right ventricular systolic function is moderately reduced. The right  ventricular size is moderately enlarged. There is severely elevated  pulmonary artery systolic pressure. The estimated right ventricular  systolic pressure is 54.0 mmHg.  3. The mitral valve is degenerative. No evidence of mitral valve  regurgitation. No evidence of mitral stenosis.  4. The inferior vena cava is dilated in size with >50% respiratory  variability, suggesting right atrial pressure of 8 mmHg.  5. The aortic valve is tricuspid. There is moderate calcification of the  aortic valve primarily of the left coronary cusp. There is moderate  thickening of the aortic valve. Aortic valve regurgitation is mild. Mild  to moderate aortic valve  sclerosis/calcification is present, without any evidence of aortic  stenosis.  6. There is mild dilatation at the  level of the sinuses of Valsalva,  measuring 37 mm. There is mild dilatation of the ascending aorta,  measuring 42 mm.  7. Tricuspid valve regurgitation is mild to moderate.  8. Right atrial size was moderately dilated.   FINDINGS  Left Ventricle: Left ventricular ejection fraction, by estimation, is 65  to 70%. The left ventricle has normal function. The left ventricle has no  regional wall motion abnormalities. The left ventricular internal cavity  size was normal in size. There is  mild concentric left ventricular hypertrophy. Left ventricular diastolic  function could not be evaluated.   Right Ventricle: The right ventricular size is moderately enlarged. No   increase in right ventricular wall thickness. Right ventricular systolic  function is moderately reduced. There is severely elevated pulmonary  artery systolic pressure. The tricuspid  regurgitant velocity is 3.76 m/s, and with an assumed right atrial  pressure of 8 mmHg, the estimated right ventricular systolic pressure is  [id number] mmHg.   Left Atrium: Left atrial size was normal in size.   Right Atrium: Right atrial size was moderately dilated.   Pericardium: There is no evidence of pericardial effusion.   Mitral Valve: The mitral valve is degenerative in appearance. There is  mild thickening of the mitral valve leaflet(s). There is mild  calcification of the mitral valve leaflet(s). Mild to moderate mitral  annular calcification. No evidence of mitral valve  regurgitation. No evidence of mitral valve stenosis.   Tricuspid Valve: The tricuspid valve is normal in structure. Tricuspid  valve regurgitation is mild to moderate. No evidence of tricuspid  stenosis.   Aortic Valve: The aortic valve is tricuspid. There is moderate  calcification of the aortic valve. There is moderate thickening of the  aortic valve. Aortic valve regurgitation is mild. Mild to moderate aortic  valve sclerosis/calcification is present,  without any evidence of aortic stenosis.   Pulmonic Valve: The pulmonic valve was normal in structure. Pulmonic valve  regurgitation is trivial. No evidence of pulmonic stenosis.   Aorta: The aortic root is normal in size and structure. There is mild  dilatation at the level of the sinuses of Valsalva, measuring 37 mm. There  is mild dilatation of the ascending aorta, measuring 42 mm.   Venous: The inferior vena cava is dilated in size with greater than 50%  respiratory variability, suggesting right atrial pressure of 8 mmHg.   IAS/Shunts: No atrial level shunt detected by color flow Doppler.     LEFT VENTRICLE  PLAX 2D  LVIDd:     4.10 cm  LVIDs:     2.60  cm  LV PW:     1.20 cm  LV IVS:    1.30 cm  LVOT diam:   2.30 cm  LV SV:     88  LV SV Index:  41  LVOT Area:   4.15 cm     RIGHT VENTRICLE       IVC  RV S prime:   13.60 cm/s IVC diam: 2.50 cm  TAPSE (M-mode): 1.6 cm   LEFT ATRIUM       Index    RIGHT ATRIUM      Index  LA diam:    4.20 cm 1.97 cm/m RA Area:   19.50 cm  LA Vol (A2C):  50.5 ml 23.69 ml/m RA Volume:  55.30 ml 25.94 ml/m  LA Vol (A4C):  71.4 ml 33.49 ml/m  LA Biplane Vol: 64.9 ml 30.44 ml/m  AORTIC VALVE  LVOT Vmax:  96.00  cm/s  LVOT Vmean: 66.400 cm/s  LVOT VTI:  0.212 m    AORTA  Ao Root diam: 3.70 cm  Ao Asc diam: 4.20 cm   TRICUSPID VALVE  TR Peak grad:  56.6 mmHg  TR Vmax:    376.00 cm/s    SHUNTS  Systemic VTI: 0.21 m  Systemic Diam: 2.30 cm   CARDIAC CATHETERIZATION 1.  Severe calcific single-vessel coronary artery disease involving a severely calcified eccentric proximal LAD lesion 2.  Patent left main, patent left circumflex with moderate nonobstructive stenosis, patent RCA with moderate nonobstructive stenosis 3.  Moderate pulmonary hypertension with normal pulmonary capillary wedge pressure/LVEDP 4.  Unsuccessful orbital atherectomy due to inability to fully treat the lesion.  Recommendations: Recommend another attempt at PCI from femoral access once the patient recovers from this procedure.  Hopefully femoral access will give better guide support and allow for completion of the procedure.  I think that the lack of contralateral aortic support and steep angulation of the left main/LAD origin contributed to technical challenges with this case.  Plan reviewed with the patient.  Continue aspirin and clopidogrel.   Antimicrobials:  Anti-infectives (From admission, onward)   Start     Dose/Rate Route Frequency Ordered Stop   01/07/21 2330  cefTRIAXone (ROCEPHIN) 1 g in sodium chloride 0.9 % 100 mL IVPB        1 g 200 mL/hr  over 30 Minutes Intravenous Every 24 hours 01/07/21 2325 01/11/21 2252       Subjective: Seen and examined at bedside and that the patient had no complaints.  Was watching television with no chest pain, shortness of breath.  Denies any lightheadedness or dizziness.  No other concerns or complaints at this time.  Objective: Vitals:   01/11/21 2341 01/12/21 0550 01/12/21 0714 01/12/21 0800  BP: 117/70 131/79    Pulse:  89    Resp:      Temp:  (!) 97.2 F (36.2 C)    TempSrc:  Axillary    SpO2:  96% 93% 98%  Weight:      Height:        Intake/Output Summary (Last 24 hours) at 01/12/2021 1653 Last data filed at 01/12/2021 0930 Gross per 24 hour  Intake 243 ml  Output 350 ml  Net -107 ml   Filed Weights   01/09/21 0600 01/10/21 0655 01/11/21 0511  Weight: 79.3 kg 79.1 kg 76.1 kg   Examination: Physical Exam:  Constitutional: WN/WD elderly Caucasian male in NAD and appears calm and comfortable Eyes: Lids and conjunctivae normal, sclerae anicteric  ENMT: External Ears, Nose appear normal. Grossly normal hearing. Neck: Appears normal, supple, no cervical masses, normal ROM, no appreciable thyromegaly; no JVD Respiratory: Diminished to auscultation bilaterally, no wheezing, rales, rhonchi or crackles. Normal respiratory effort and patient is not tachypenic. No accessory muscle use. Unlabored breathing but wearing supplemental O2 via [place] Cardiovascular: RRR, no murmurs / rubs / gallops. S1 and S2 auscultated.  Abdomen: Soft, non-tender, non-distended. Bowel sounds positive.  GU: Deferred. Musculoskeletal: No clubbing / cyanosis of digits/nails. No joint deformity upper and lower extremities. Skin: No rashes, lesions, ulcers on a limited skin evaluation. No induration; Warm and dry.  Neurologic: CN 2-12 grossly intact with no focal deficits. Romberg sign and cerebellar reflexes not assessed.  Psychiatric: Normal judgment and insight. Alert and oriented x 3. Normal mood and appropriate  affect.   Data Reviewed: I have personally reviewed following labs and imaging studies  CBC: Recent Labs  Lab  01/08/21 0755 01/09/21 0234 01/10/21 0304 01/11/21 0219 01/11/21 0904 01/11/21 0918 01/12/21 0256  WBC 5.7 8.3 12.2* 12.6*  --   --  10.6*  NEUTROABS 4.9 7.5 10.5* 10.8*  --   --  9.7*  HGB 10.9* 11.1* 11.2* 11.4* 12.6* 11.9* 10.7*  HCT 37.6* 34.4* 35.1* 37.3* 37.0* 35.0* 35.5*  MCV 80.9 76.6* 76.6* 76.4*  --   --  78.0*  PLT 328 338 359 362  --   --  [id number]   Basic Metabolic Panel: Recent Labs  Lab 01/07/21 1638 01/07/21 2336 01/08/21 0339 01/09/21 0234 01/10/21 0304 01/11/21 0219 01/11/21 0904 01/11/21 0918 01/12/21 0256  NA  --    < > 134* 135 134* 133* 135 135 133*  K  --    < > 4.9 4.2 3.9 3.8 4.1 3.9 5.0  CL  --   --  106 103 98 93*  --   --  100  CO2  --   --  18* 21* 23 26  --   --  25  GLUCOSE  --   --  287* 308* 308* 268*  --   --  216*  BUN  --   --  15 20 26* 28*  --   --  26*  CREATININE  --   --  1.10 1.04 1.21 1.15  --   --  0.99  CALCIUM  --   --  8.5* 9.0 9.0 9.2  --   --  8.9  MG 1.5*  --  1.6* 1.9  --  1.8  --   --  2.0  PHOS  --   --  4.5  --   --   --   --   --  3.5   < > = values in this interval not displayed.   GFR: Estimated Creatinine Clearance: 69.4 mL/min (by C-G formula based on SCr of 0.99 mg/dL). Liver Function Tests: Recent Labs  Lab 01/07/21 1408 01/08/21 0339 01/12/21 0256  AST 20 14* 20  ALT 17 16 21   ALKPHOS 54 47 50  BILITOT 0.4 0.7 0.7  PROT 6.5 5.7* 5.5*  ALBUMIN 3.4* 2.9* 3.0*   No results for input(s): LIPASE, AMYLASE in the last 168 hours. No results for input(s): AMMONIA in the last 168 hours. Coagulation Profile: No results for input(s): INR, PROTIME in the last 168 hours. Cardiac Enzymes: No results for input(s): CKTOTAL, CKMB, CKMBINDEX, TROPONINI in the last 168 hours. BNP (last 3 results) No results for input(s): PROBNP in the last 8760 hours. HbA1C: No results for input(s): HGBA1C in the last 72  hours. CBG: Recent Labs  Lab 01/11/21 1156 01/11/21 1649 01/11/21 2111 01/12/21 0759 01/12/21 1221  GLUCAP 182* 219* 133* 164* 304*   Lipid Profile: No results for input(s): CHOL, HDL, LDLCALC, TRIG, CHOLHDL, LDLDIRECT in the last 72 hours. Thyroid Function Tests: No results for input(s): TSH, T4TOTAL, FREET4, T3FREE, THYROIDAB in the last 72 hours. Anemia Panel: No results for input(s): VITAMINB12, FOLATE, FERRITIN, TIBC, IRON, RETICCTPCT in the last 72 hours. Sepsis Labs: No results for input(s): PROCALCITON, LATICACIDVEN in the last 168 hours.  Recent Results (from the past 240 hour(s))  Resp Panel by RT-PCR (Flu A&B, Covid) Nasopharyngeal Swab     Status: None   Collection Time: 01/07/21  3:41 PM   Specimen: Nasopharyngeal Swab; Nasopharyngeal(NP) swabs in vial transport medium  Result Value Ref Range Status   SARS Coronavirus 2 by RT PCR NEGATIVE NEGATIVE Final  Comment: (NOTE) SARS-CoV-2 target nucleic acids are NOT DETECTED.  The SARS-CoV-2 RNA is generally detectable in upper respiratory specimens during the acute phase of infection. The lowest concentration of SARS-CoV-2 viral copies this assay can detect is 138 copies/mL. A negative result does not preclude SARS-Cov-2 infection and should not be used as the sole basis for treatment or other patient management decisions. A negative result may occur with  improper specimen collection/handling, submission of specimen other than nasopharyngeal swab, presence of viral mutation(s) within the areas targeted by this assay, and inadequate number of viral copies(<138 copies/mL). A negative result must be combined with clinical observations, patient history, and epidemiological information. The expected result is Negative.  Fact Sheet for Patients:  EntrepreneurPulse.com.au  Fact Sheet for Healthcare Providers:  IncredibleEmployment.be  This test is no t yet approved or cleared by  the [place] FDA and  has been authorized for detection and/or diagnosis of SARS-CoV-2 by FDA under an Emergency Use Authorization (EUA). This EUA will remain  in effect (meaning this test can be used) for the duration of the COVID-19 declaration under Section 564(b)(1) of the Act, 21 U.S.C.section 360bbb-3(b)(1), unless the authorization is terminated  or revoked sooner.       Influenza A by PCR NEGATIVE NEGATIVE Final   Influenza B by PCR NEGATIVE NEGATIVE Final    Comment: (NOTE) The Xpert Xpress SARS-CoV-2/FLU/RSV plus assay is intended as an aid in the diagnosis of influenza from Nasopharyngeal swab specimens and should not be used as a sole basis for treatment. Nasal washings and aspirates are unacceptable for Xpert Xpress SARS-CoV-2/FLU/RSV testing.  Fact Sheet for Patients: EntrepreneurPulse.com.au  Fact Sheet for Healthcare Providers: IncredibleEmployment.be  This test is not yet approved or cleared by the [place] FDA and has been authorized for detection and/or diagnosis of SARS-CoV-2 by FDA under an Emergency Use Authorization (EUA). This EUA will remain in effect (meaning this test can be used) for the duration of the COVID-19 declaration under Section 564(b)(1) of the Act, 21 U.S.C. section 360bbb-3(b)(1), unless the authorization is terminated or revoked.  Performed at [facility], [place] [address], [place], [place] [address]   MRSA PCR Screening     Status: None   Collection Time: 01/09/21 12:17 AM   Specimen: Nasal Mucosa; Nasopharyngeal  Result Value Ref Range Status   MRSA by PCR NEGATIVE NEGATIVE Final    Comment:        The GeneXpert MRSA Assay (FDA approved for NASAL specimens only), is one component of a comprehensive MRSA colonization surveillance program. It is not intended to diagnose MRSA infection nor to guide or monitor treatment for MRSA infections. Performed at [facility],  [place] [address] [place], [place] [address]      RN Pressure Injury Documentation: Pressure Injury 01/08/21 Ear Left Stage 2 -  Partial thickness loss of dermis presenting as a shallow open injury with a red, pink wound bed without slough. Under Oxygen Tubing from Home O2 (Active)  01/08/21 1800  Location: Ear  Location Orientation: Left  Staging: Stage 2 -  Partial thickness loss of dermis presenting as a shallow open injury with a red, pink wound bed without slough.  Wound Description (Comments): Under Oxygen Tubing from Home O2  Present on Admission: Yes     Estimated body mass index is 19.39 kg/m as calculated from the following:   Height as of this encounter: 6\' 6"  (1.981 m).   Weight as of this encounter: 76.1 kg.  Malnutrition Type:  Nutrition Problem: Moderate Malnutrition Etiology: chronic illness (COPD)  Malnutrition Characteristics:  Signs/Symptoms: mild fat depletion,moderate fat depletion,mild muscle depletion,moderate muscle depletion  Nutrition Interventions:  Interventions: MVI,Ensure Enlive (each supplement provides 350kcal and 20 grams of protein)   Radiology Studies: CARDIAC CATHETERIZATION  Result Date: 01/11/2021 1.  Severe calcific single-vessel coronary artery disease involving a severely calcified eccentric proximal LAD lesion 2.  Patent left main, patent left circumflex with moderate nonobstructive stenosis, patent RCA with moderate nonobstructive stenosis 3.  Moderate pulmonary hypertension with normal pulmonary capillary wedge pressure/LVEDP 4.  Unsuccessful orbital atherectomy due to inability to fully treat the lesion. Recommendations: Recommend another attempt at PCI from femoral access once the patient recovers from this procedure.  Hopefully femoral access will give better guide support and allow for completion of the procedure.  I think that the lack of contralateral aortic support and steep angulation of the left main/LAD origin contributed to technical  challenges with this case.  Plan reviewed with the patient.  Continue aspirin and clopidogrel.  Scheduled Meds: . aspirin  81 mg Oral Daily  . atorvastatin  40 mg Oral QHS  . bisoprolol  5 mg Oral Daily  . clopidogrel  75 mg Oral Daily  . diltiazem  60 mg Oral Q6H  . enoxaparin (LOVENOX) injection  40 mg Subcutaneous Q24H  . feeding supplement (GLUCERNA SHAKE)  237 mL Oral BID BM  . ferrous sulfate  325 mg Oral BID WC  . insulin aspart  0-5 Units Subcutaneous QHS  . insulin aspart  0-9 Units Subcutaneous TID WC  . insulin aspart  8 Units Subcutaneous TID WC  . insulin glargine  25 Units Subcutaneous Daily  . ipratropium-albuterol  3 mL Nebulization BID  . magnesium oxide  400 mg Oral Daily  . mometasone-formoterol  2 puff Inhalation BID  . multivitamin with minerals  1 tablet Oral Daily  . nortriptyline  50 mg Oral QHS  . oxybutynin  10 mg Oral Daily  . pantoprazole  40 mg Oral Daily  . Ensure Max Protein  11 oz Oral QHS  . sodium chloride flush  3 mL Intravenous Q12H  . sodium chloride flush  3 mL Intravenous Q12H  . vitamin B-12  1,000 mcg Oral Daily   Continuous Infusions: . sodium chloride      LOS: 5 days   Kerney Elbe, DO [facility] PAGER is on AMION  If 7PM-7AM, please contact night-coverage [email or web address]

## 2021-01-12 NOTE — Progress Notes (Signed)
Inpatient Diabetes Program Recommendations  AACE/ADA: New Consensus Statement on Inpatient Glycemic Control (2015)  Target Ranges:  Prepandial:   less than 140 mg/dL      Peak postprandial:   less than 180 mg/dL (1-2 hours)      Critically ill patients:  140 - 180 mg/dL   Lab Results  Component Value Date   GLUCAP 304 (H) 01/12/2021   HGBA1C 6.4 (H) 01/07/2021    Review of Glycemic Control Results for Brandon [name], Brandon [name] (MRN [id number]) as of 01/12/2021 13:21  Ref. Range 01/12/2021 07:59 01/12/2021 12:21  Glucose-Capillary Latest Ref Range: 70 - 99 mg/dL 164 (H) 304 (H)    Inpatient Diabetes Program Recommendations:    Novolog 10 units TID if eats at least 50%  Will continue to follow while inpatient.  Thank you, Reche Dixon, RN, BSN Diabetes Coordinator Inpatient Diabetes Program [phone] (team pager from 8a-5p)

## 2021-01-12 NOTE — Progress Notes (Signed)
Occupational Therapy Treatment Patient Details Name: Brandon [name] MRN: [id number] DOB: 08/08/1946 Today's Date: 01/12/2021    History of present illness Pt is a 75 year old man admitted on 01/09/20 with SOB. 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   OT comments  Pt ambulated with RW from bed to sink for grooming with supervision and agreed to remain up in chair at end of session. Sp02 97-100% on 7L 02.   Follow Up Recommendations  Home health OT    Equipment Recommendations  None recommended by OT    Recommendations for Other Services      Precautions / Restrictions Precautions Precautions: Fall       Mobility Bed Mobility Overal bed mobility: Needs Assistance Bed Mobility: Supine to Sit     Supine to sit: Supervision     General bed mobility comments: for lines  Transfers Overall transfer level: Needs assistance Equipment used: Rolling walker (2 wheeled) Transfers: Sit to/from Stand Sit to Stand: Supervision              Balance Overall balance assessment: Needs assistance   Sitting balance-Leahy Scale: Good     Standing balance support: Bilateral upper extremity supported;Single extremity supported Standing balance-Leahy Scale: Fair Standing balance comment: statically at sink                           ADL either performed or assessed with clinical judgement   ADL Overall ADL's : Needs assistance/impaired     Grooming: Supervision/safety;Standing;Wash/dry face           Upper Body Dressing : Set up;Sitting                           Vision       Perception     Praxis      Cognition Arousal/Alertness: Awake/alert Behavior During Therapy: WFL for tasks assessed/performed Overall Cognitive Status: Impaired/Different from baseline Area of Impairment: Memory                     Memory: Decreased short-term memory                   Exercises     Shoulder Instructions       General Comments      Pertinent Vitals/ Pain       Pain Assessment: No/denies pain  Home Living                                          Prior Functioning/Environment              Frequency  Min 2X/week        Progress Toward Goals  OT Goals(current goals can now be found in the care plan section)  Progress towards OT goals: Progressing toward goals  Acute Rehab OT Goals Patient Stated Goal: return home OT Goal Formulation: With patient Time For Goal Achievement: 01/23/21 Potential to Achieve Goals: Good  Plan Discharge plan remains appropriate    Co-evaluation                 AM-PAC OT "6 Clicks" Daily Activity     Outcome Measure   Help from another person eating meals?:  None Help from another person taking care of personal grooming?: A Little Help from another person toileting, which includes using toliet, bedpan, or urinal?: A Little Help from another person bathing (including washing, rinsing, drying)?: A Little Help from another person to put on and taking off regular upper body clothing?: None Help from another person to put on and taking off regular lower body clothing?: A Little 6 Click Score: 20    End of Session Equipment Utilized During Treatment: Oxygen (7L)  OT Visit Diagnosis: Unsteadiness on feet (R26.81);Other abnormalities of gait and mobility (R26.89);Muscle weakness (generalized) (M62.81);Other (comment)   Activity Tolerance Patient tolerated treatment well   Patient Left in chair;with call bell/phone within reach   Nurse Communication          Time: 9758-8325 OT Time Calculation (min): 29 min  Charges: OT General Charges $OT Visit: 1 Visit OT Treatments $Self Care/Home Management : 23-37 mins  Nestor Lewandowsky, OTR/L Acute Rehabilitation Services Pager: [phone] Office: [phone]  Malka So 01/12/2021, 10:29 AM

## 2021-01-12 NOTE — Progress Notes (Signed)
[facility] night shift.  The staff reports that the patient CBGs 426 mg/dL.  The patient last received 10 units of regular insulin at 1738 earlier today when her CBG was 395 mg/dL.  The patient is also on Lantus 25 units SQ daily, which the patient received earlier today in the morning.  The patient also received 40 mg of prednisone daily for the past 4 days.  NovoLog 8 units SQ x1 ordered.  Will continue to monitor CBG.  Tennis Must, MD.

## 2021-01-13 ENCOUNTER — Ambulatory Visit (HOSPITAL_COMMUNITY): Admit: 2021-01-13 | Payer: Medicare HMO | Admitting: Cardiology

## 2021-01-13 ENCOUNTER — Encounter (HOSPITAL_COMMUNITY)
Admission: EM | Disposition: A | Discharge status: Home Health | Payer: Self-pay | Source: Home / Self Care | Attending: Internal Medicine

## 2021-01-13 ENCOUNTER — Encounter (HOSPITAL_COMMUNITY): Payer: Self-pay | Admitting: Cardiology

## 2021-01-13 DIAGNOSIS — I2729 Other secondary pulmonary hypertension: Secondary | ICD-10-CM

## 2021-01-13 DIAGNOSIS — R778 Other specified abnormalities of plasma proteins: Secondary | ICD-10-CM | POA: Diagnosis not present

## 2021-01-13 DIAGNOSIS — Z8673 Personal history of transient ischemic attack (TIA), and cerebral infarction without residual deficits: Secondary | ICD-10-CM | POA: Diagnosis not present

## 2021-01-13 DIAGNOSIS — J441 Chronic obstructive pulmonary disease with (acute) exacerbation: Secondary | ICD-10-CM | POA: Diagnosis not present

## 2021-01-13 DIAGNOSIS — I2510 Atherosclerotic heart disease of native coronary artery without angina pectoris: Secondary | ICD-10-CM | POA: Diagnosis not present

## 2021-01-13 DIAGNOSIS — J9621 Acute and chronic respiratory failure with hypoxia: Secondary | ICD-10-CM | POA: Diagnosis not present

## 2021-01-13 DIAGNOSIS — I2583 Coronary atherosclerosis due to lipid rich plaque: Secondary | ICD-10-CM | POA: Diagnosis not present

## 2021-01-13 HISTORY — PX: CORONARY STENT INTERVENTION: CATH118234

## 2021-01-13 HISTORY — PX: CORONARY ATHERECTOMY: CATH118238

## 2021-01-13 HISTORY — PX: INTRAVASCULAR ULTRASOUND/IVUS: CATH118244

## 2021-01-13 LAB — COMPREHENSIVE METABOLIC PANEL
ALT: 22 U/L (ref 0–44)
AST: 17 U/L (ref 15–41)
Albumin: 3 g/dL — ABNORMAL LOW (ref 3.5–5.0)
Alkaline Phosphatase: 53 U/L (ref 38–126)
Anion gap: 10 (ref 5–15)
BUN: 32 mg/dL — ABNORMAL HIGH (ref 8–23)
CO2: 26 mmol/L (ref 22–32)
Calcium: 9.4 mg/dL (ref 8.9–10.3)
Chloride: 99 mmol/L (ref 98–111)
Creatinine, Ser: 1.05 mg/dL (ref 0.61–1.24)
GFR, Estimated: >60 mL/min (ref >60)
Glucose, Bld: 193 mg/dL — ABNORMAL HIGH (ref 70–99)
Potassium: 5.1 mmol/L (ref 3.5–5.1)
Sodium: 135 mmol/L (ref 135–145)
Total Bilirubin: 0.6 mg/dL (ref 0.3–1.2)
Total Protein: 5.7 g/dL — ABNORMAL LOW (ref 6.5–8.1)

## 2021-01-13 LAB — CBC WITH DIFFERENTIAL/PLATELET
Abs Immature Granulocytes: 0.07 10*3/uL (ref 0.00–0.07)
Basophils Absolute: 0 10*3/uL (ref 0.0–0.1)
Basophils Relative: 0 %
Eosinophils Absolute: 0 10*3/uL (ref 0.0–0.5)
Eosinophils Relative: 0 %
HCT: 35.6 % — ABNORMAL LOW (ref 39.0–52.0)
Hemoglobin: 11.1 g/dL — ABNORMAL LOW (ref 13.0–17.0)
Immature Granulocytes: 1 %
Lymphocytes Relative: 9 %
Lymphs Abs: 0.9 10*3/uL (ref 0.7–4.0)
MCH: 24.3 pg — ABNORMAL LOW (ref 26.0–34.0)
MCHC: 31.2 g/dL (ref 30.0–36.0)
MCV: 77.9 fL — ABNORMAL LOW (ref 80.0–100.0)
Monocytes Absolute: 1.1 10*3/uL — ABNORMAL HIGH (ref 0.1–1.0)
Monocytes Relative: 11 %
Neutro Abs: 8.1 10*3/uL — ABNORMAL HIGH (ref 1.7–7.7)
Neutrophils Relative %: 79 %
Platelets: 333 10*3/uL (ref 150–400)
RBC: 4.57 MIL/uL (ref 4.22–5.81)
RDW: 21.1 % — ABNORMAL HIGH (ref 11.5–15.5)
WBC: 10.2 10*3/uL (ref 4.0–10.5)
nRBC: 0 % (ref 0.0–0.2)

## 2021-01-13 LAB — CBC
HCT: 33.7 % — ABNORMAL LOW (ref 39.0–52.0)
Hemoglobin: 10.8 g/dL — ABNORMAL LOW (ref 13.0–17.0)
MCH: 24.8 pg — ABNORMAL LOW (ref 26.0–34.0)
MCHC: 32 g/dL (ref 30.0–36.0)
MCV: 77.3 fL — ABNORMAL LOW (ref 80.0–100.0)
Platelets: 315 10*3/uL (ref 150–400)
RBC: 4.36 MIL/uL (ref 4.22–5.81)
RDW: 20.9 % — ABNORMAL HIGH (ref 11.5–15.5)
WBC: 11.1 10*3/uL — ABNORMAL HIGH (ref 4.0–10.5)
nRBC: 0.2 % (ref 0.0–0.2)

## 2021-01-13 LAB — GLUCOSE, CAPILLARY
Glucose-Capillary: 127 mg/dL — ABNORMAL HIGH (ref 70–99)
Glucose-Capillary: 67 mg/dL — ABNORMAL LOW (ref 70–99)
Glucose-Capillary: 73 mg/dL (ref 70–99)
Glucose-Capillary: 164 mg/dL — ABNORMAL HIGH (ref 70–99)
Glucose-Capillary: 255 mg/dL — ABNORMAL HIGH (ref 70–99)

## 2021-01-13 LAB — POCT ACTIVATED CLOTTING TIME
Activated Clotting Time: 339 seconds
Activated Clotting Time: 220 seconds
Activated Clotting Time: 255 seconds
Activated Clotting Time: 273 seconds
Activated Clotting Time: 214 seconds
Activated Clotting Time: 172 seconds

## 2021-01-13 LAB — CREATININE, SERUM
Creatinine, Ser: 0.99 mg/dL (ref 0.61–1.24)
GFR, Estimated: >60 mL/min (ref >60)

## 2021-01-13 LAB — MAGNESIUM: Magnesium: 1.9 mg/dL (ref 1.7–2.4)

## 2021-01-13 LAB — PHOSPHORUS: Phosphorus: 3.4 mg/dL (ref 2.5–4.6)

## 2021-01-13 SURGERY — CORONARY STENT INTERVENTION
Anesthesia: LOCAL

## 2021-01-13 MED ORDER — FENTANYL CITRATE (PF) 100 MCG/2ML IJ SOLN
INTRAMUSCULAR | Status: DC | PRN
  Administered 2021-01-13: 25 ug via INTRAVENOUS

## 2021-01-13 MED ORDER — SODIUM CHLORIDE 0.9 % WEIGHT BASED INFUSION
1.0000 mL/kg/h | INTRAVENOUS | Status: AC
  Administered 2021-01-13 (×2): 1 mL/kg/h via INTRAVENOUS

## 2021-01-13 MED ORDER — HEPARIN (PORCINE) IN NACL 1000-0.9 UT/500ML-% IV SOLN
INTRAVENOUS | Status: DC | PRN
  Administered 2021-01-13: 500 mL

## 2021-01-13 MED ORDER — FENTANYL CITRATE (PF) 100 MCG/2ML IJ SOLN
INTRAMUSCULAR | Status: AC
  Filled 2021-01-13: qty 2

## 2021-01-13 MED ORDER — ENOXAPARIN SODIUM 40 MG/0.4ML ~~LOC~~ SOLN
40.0000 mg | SUBCUTANEOUS | Status: DC
  Administered 2021-01-14: 40 mg via SUBCUTANEOUS
  Filled 2021-01-13: qty 0.4

## 2021-01-13 MED ORDER — MIDAZOLAM HCL 2 MG/2ML IJ SOLN
INTRAMUSCULAR | Status: DC | PRN
  Administered 2021-01-13: 1 mg via INTRAVENOUS

## 2021-01-13 MED ORDER — IOHEXOL 350 MG/ML SOLN
INTRAVENOUS | Status: AC
  Filled 2021-01-13: qty 1

## 2021-01-13 MED ORDER — SODIUM CHLORIDE 0.9 % IV SOLN: 250.0000 mL | INTRAVENOUS | Status: DC | PRN

## 2021-01-13 MED ORDER — SODIUM CHLORIDE 0.9% FLUSH: 3.0000 mL | INTRAVENOUS | Status: DC | PRN

## 2021-01-13 MED ORDER — SODIUM CHLORIDE 0.9% FLUSH
3.0000 mL | Freq: Two times a day (BID) | INTRAVENOUS | Status: DC
  Administered 2021-01-14: 3 mL via INTRAVENOUS

## 2021-01-13 MED ORDER — LIDOCAINE HCL (PF) 1 % IJ SOLN
INTRAMUSCULAR | Status: AC
  Filled 2021-01-13: qty 30

## 2021-01-13 MED ORDER — MIDAZOLAM HCL 2 MG/2ML IJ SOLN
INTRAMUSCULAR | Status: AC
  Filled 2021-01-13: qty 2

## 2021-01-13 MED ORDER — NITROGLYCERIN 1 MG/10 ML FOR IR/CATH LAB
INTRA_ARTERIAL | Status: AC
  Filled 2021-01-13: qty 10

## 2021-01-13 MED ORDER — SODIUM CHLORIDE 0.9 % WEIGHT BASED INFUSION
3.0000 mL/kg/h | INTRAVENOUS | Status: DC
  Administered 2021-01-13: 3 mL/kg/h via INTRAVENOUS

## 2021-01-13 MED ORDER — ASPIRIN 81 MG PO CHEW
81.0000 mg | CHEWABLE_TABLET | ORAL | Status: AC
  Administered 2021-01-13: 81 mg via ORAL
  Filled 2021-01-13: qty 1

## 2021-01-13 MED ORDER — HEPARIN SODIUM (PORCINE) 1000 UNIT/ML IJ SOLN
INTRAMUSCULAR | Status: AC
  Filled 2021-01-13: qty 1

## 2021-01-13 MED ORDER — SODIUM CHLORIDE 0.9 % WEIGHT BASED INFUSION
1.0000 mL/kg/h | INTRAVENOUS | Status: DC
  Administered 2021-01-13: 1 mL/kg/h via INTRAVENOUS

## 2021-01-13 MED ORDER — HEPARIN SODIUM (PORCINE) 1000 UNIT/ML IJ SOLN
INTRAMUSCULAR | Status: DC | PRN
  Administered 2021-01-13: 4000 [IU] via INTRAVENOUS
  Administered 2021-01-13: 3000 [IU] via INTRAVENOUS
  Administered 2021-01-13: 8000 [IU] via INTRAVENOUS

## 2021-01-13 MED ORDER — SODIUM CHLORIDE 0.9% FLUSH: 3.0000 mL | Freq: Two times a day (BID) | INTRAVENOUS | Status: DC

## 2021-01-13 MED ORDER — HEPARIN (PORCINE) IN NACL 1000-0.9 UT/500ML-% IV SOLN
INTRAVENOUS | Status: AC
  Filled 2021-01-13: qty 1500

## 2021-01-13 MED ORDER — INSULIN ASPART 100 UNIT/ML ~~LOC~~ SOLN
5.0000 [IU] | Freq: Three times a day (TID) | SUBCUTANEOUS | Status: DC
  Administered 2021-01-14: 5 [IU] via SUBCUTANEOUS

## 2021-01-13 MED ORDER — LIDOCAINE HCL (PF) 1 % IJ SOLN
INTRAMUSCULAR | Status: DC | PRN
  Administered 2021-01-13: 30 mL

## 2021-01-13 MED ORDER — NITROGLYCERIN 1 MG/10 ML FOR IR/CATH LAB
INTRA_ARTERIAL | Status: DC | PRN
  Administered 2021-01-13: 200 ug via INTRACORONARY

## 2021-01-13 SURGICAL SUPPLY — 26 items
BALLN MINITREK OTW 1.5X12 (BALLOONS) ×2
BALLN SAPPHIRE 2.5X15 (BALLOONS) ×2
BALLN SAPPHIRE ~~LOC~~ 3.75X15 (BALLOONS) ×1 IMPLANT
BALLOON MINITREK OTW 1.5X12 (BALLOONS) IMPLANT
BALLOON SAPPHIRE 2.5X15 (BALLOONS) IMPLANT
CATH OPTICROSS HD (CATHETERS) ×1 IMPLANT
CATH TELEPORT (CATHETERS) ×1 IMPLANT
CATH TELESCOPE 6F GEC (CATHETERS) ×1 IMPLANT
CATH VISTA GUIDE 7FR XB4 (CATHETERS) ×1 IMPLANT
CROWN DIAMONDBACK CLASSIC 1.25 (BURR) ×1 IMPLANT
ELECT DEFIB PAD ADLT CADENCE (PAD) ×1 IMPLANT
KIT ENCORE 26 ADVANTAGE (KITS) ×1 IMPLANT
KIT HEART LEFT (KITS) ×2 IMPLANT
LUBRICANT VIPERSLIDE CORONARY (MISCELLANEOUS) ×1 IMPLANT
PACK CARDIAC CATHETERIZATION (CUSTOM PROCEDURE TRAY) ×2 IMPLANT
PINNACLE LONG 7F 25CM (SHEATH) ×2
SHEATH INTRO PINNACLE 7F 25CM (SHEATH) IMPLANT
SHEATH PROBE COVER 6X72 (BAG) ×1 IMPLANT
SLED PULL BACK IVUS (MISCELLANEOUS) ×1 IMPLANT
STENT RESOLUTE ONYX 3.5X30 (Permanent Stent) ×1 IMPLANT
TRANSDUCER W/STOPCOCK (MISCELLANEOUS) ×2 IMPLANT
TUBING CIL FLEX 10 FLL-RA (TUBING) ×2 IMPLANT
WIRE ASAHI PROWATER 180CM (WIRE) ×1 IMPLANT
WIRE ASAHI PROWATER 300CM (WIRE) ×1 IMPLANT
WIRE EMERALD 3MM-J .035X150CM (WIRE) ×1 IMPLANT
WIRE VIPERWIRE COR FLEX .012 (WIRE) ×1 IMPLANT

## 2021-01-13 NOTE — Progress Notes (Signed)
PROGRESS NOTE    Brandon [name]  [id number] DOB: [birth year] DOA: 01/07/2021 PCP: Pcp, No   Brief Narrative:  HPI per Dr. Toy Baker on 01/07/21 Rafiq Oiler is a 75 y.o. 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 Underwent a R/LHC today and showed "Severe calcific single-vessel coronary artery disease involving a severely calcified eccentric proximal LAD lesion 2.  Patent left main, patent left circumflex with moderate nonobstructive stenosis, patent RCA with moderate nonobstructive stenosis 3.  Moderate pulmonary hypertension with normal pulmonary capillary wedge pressure/LVEDP 4.  Unsuccessful orbital atherectomy due to inability to fully treat the lesion." Now Cardiology will recommend repeating Heart Cath from a femoral site and this will be done 01/13/21 and this was done and he is s/p PCI of Severe, Calcified Proximal LAD Stenosis   Assessment & Plan:   Active Problems:   Essential hypertension   Type 2 diabetes mellitus (HCC)   CAD (coronary artery disease)   GERD (gastroesophageal reflux disease)   Acute on chronic respiratory failure with hypoxemia (HCC)   Tobacco abuse   Hyponatremia   Elevated troponin   COPD with acute exacerbation (HCC)   History of CVA (cerebrovascular accident)   COPD exacerbation (HCC)   Hypomagnesemia   Pressure injury of skin   Malnutrition of moderate degree  Acute on chronic Hypoxic  Respiratory Failure Possibly COPD with acute exacerbation -On 4 L of O2 at baseline, required about 10L on presentation -Currently afebrile, with leukocytosis (steroids) -Chest x-ray with evidence of emphysematous changes -D-dimer elevated, CTA chest with no evidence of PE -Covid, flu panel negative -Continue Duoneb 3 mL Neb BID, inhalers with Dulera 2 piff IH , steroids now stopped, Mucinex -Continue ceftriaxone for total of 5 days -SpO2: 96 % O2 Flow Rate (L/min): 5 L/min -Continue supplemental oxygen, BiPAP as needed -Continue to Monitor closely -Given IV Lasix 40 mg BID x 4 Doses and now stopped -Continue to Monitor Respiratory Status carefully -Repeat CXR in the AM   NSTEMI with CAD Severe Pulmonary Arterial Hypertension Elevated Troponin Mild to Moderate TR -EKG with no acute ST changes -Tropnin went from 49 -> 39 -> 33 -> 343 -> 1258 -> 1140 -Echo showed EF of 60 to 65%, no regional wall motion abnormality, noted PAH pressure 64.6 mmHg -Cardiology on board, plan for cardiac cath on 01/11/2021 but will need to be done again on Friday from a Femoral Site -Radial Cath showed "1.  Severe calcific single-vessel coronary artery disease involving a severely calcified eccentric proximal LAD lesion 2.  Patent left main, patent left circumflex with moderate nonobstructive stenosis, patent RCA with moderate nonobstructive stenosis 3.  Moderate pulmonary hypertension with normal pulmonary capillary wedge pressure/LVEDP 4.  Unsuccessful orbital atherectomy due to inability to fully treat the lesion." -Lasix now stopped; Cardizem (as rec by cardiology) -Repeat Cath on 01/13/21 done and showed Successful PCI of the proximal to mid LAD with orbital atherectomy and stenting  with DES x 1 and IVUS guidance -C/w Telemetry -Cardiology recommending DAPT x 1 year  Diabetes Mellitus Type 2 -Last A1c 6.4 on 01/07/2021, poor control, currently 2/2 steroid use -C/w Sensistive Novolog SSI AC/HS, Lantus 25  mg sq Daily and Novolog 8 units sq TIDwm and increased to 10 Units TID if eats at least 50% of his meals; Now will reduce to 5 units TID given hypoglycemic Episode  -C/w Accu-Cheks, hypoglycemic protocol -Diabetes coordinator, appreciate recs -CBG's ranging from 67-127  Hypomagnesemia -Mag Level is now 1.9 -Continue with po Mag Oxide 400 mg po Daily  -Continue to Monitor and Replete as Necessary -Repeat Mag Level in the AM   Microcytic Anemia/Vitamin B12 Deficiency -Hemoglobin somewhat around baseline -Anemia panel showed iron 14, sats 4, B12 141 -Hgb/Hct went from 11.2/35.1 -> 11.4/37.3 -> 10.7/35.5 -> 11.1/35.6 -> 10.8/33.7 -Oral iron and vitamin B12 supplementation -Continue to Monitor and Trend  -Repeat CBC in the AM    Hypertension -BP stable and last BP was 128/72 -Continue Diltiazem 60 mg po q6h and Cardiology considering  -Continue to Monitor BP per Protocol   Leukocytosis -In the setting of Steroid Demargination -Patient's WBC went from 12.2 -> 12.6 -> 10.6 -> 10.2 -> 11.1 -Continue to Monitor for S/Sx of Infection -Repeat CBC in the AM  History of CAD/CVA -Currently chest pain-free -Continue Aspirin 81 mg po daily, Atorvastatin 40 mg po Daily, Clopidogrel 75 mg po daily,  -He was also switched to Bisoprolol 5 mg po Daily due to COPD exacerbation -C/w DAPT for 1 year  GERD -Continue Pantoprazole 40 mg po Daily   Moderate Malnutrition in the Context of Chronic illness -Nutritionist consulted for further evaluation  -Estimated body mass index is 19.59 kg/m as calculated from the following:   Height as of this encounter: 6\' 6"  (1.981 m).   Weight as of this encounter: 76.9 kg. -C/w Glucerna Shake po BID, Ensure Max po Daily and MVI + Minerals Daily   DVT prophylaxis: Enoxaparin 40 mg sq q24h Code Status: FULL CODE Family Communication: No family present at bedside  Disposition Plan: Anticipating D/C Home in the AM if Cardiology   Status is:  Inpatient  Remains inpatient appropriate because:Unsafe d/c plan, IV treatments appropriate due to intensity of illness or inability to take PO and Inpatient level of care appropriate due to severity of illness   Dispo: The patient is from: Home              Anticipated d/c is to: Home              Anticipated d/c date is: 1 days              Patient currently is not medically stable to d/c.  Consultants:   Cardiology   Procedures:  ECHOCARDIOGRAM IMPRESSIONS    1. Left ventricular ejection fraction, by estimation, is 60 to 65%. The  left ventricle has normal function. The left ventricle has no regional  wall motion abnormalities. There is mild concentric left ventricular  hypertrophy and moderate basal septal  hypertrophy.  2. Right ventricular systolic function is moderately reduced. The right  ventricular size is moderately enlarged. There is severely elevated  pulmonary artery systolic pressure. The estimated right ventricular  systolic pressure is Q000111Q mmHg.  3. The mitral valve is degenerative. No evidence of mitral valve  regurgitation. No evidence of mitral stenosis.  4. The inferior vena cava is dilated in size with >50% respiratory  variability, suggesting right atrial  pressure of 8 mmHg.  5. The aortic valve is tricuspid. There is moderate calcification of the  aortic valve primarily of the left coronary cusp. There is moderate  thickening of the aortic valve. Aortic valve regurgitation is mild. Mild  to moderate aortic valve  sclerosis/calcification is present, without any evidence of aortic  stenosis.  6. There is mild dilatation at the level of the sinuses of Valsalva,  measuring 37 mm. There is mild dilatation of the ascending aorta,  measuring 42 mm.  7. Tricuspid valve regurgitation is mild to moderate.  8. Right atrial size was moderately dilated.   FINDINGS  Left Ventricle: Left ventricular ejection fraction, by estimation, is 65  to 70%. The  left ventricle has normal function. The left ventricle has no  regional wall motion abnormalities. The left ventricular internal cavity  size was normal in size. There is  mild concentric left ventricular hypertrophy. Left ventricular diastolic  function could not be evaluated.   Right Ventricle: The right ventricular size is moderately enlarged. No  increase in right ventricular wall thickness. Right ventricular systolic  function is moderately reduced. There is severely elevated pulmonary  artery systolic pressure. The tricuspid  regurgitant velocity is 3.76 m/s, and with an assumed right atrial  pressure of 8 mmHg, the estimated right ventricular systolic pressure is  Q000111Q mmHg.   Left Atrium: Left atrial size was normal in size.   Right Atrium: Right atrial size was moderately dilated.   Pericardium: There is no evidence of pericardial effusion.   Mitral Valve: The mitral valve is degenerative in appearance. There is  mild thickening of the mitral valve leaflet(s). There is mild  calcification of the mitral valve leaflet(s). Mild to moderate mitral  annular calcification. No evidence of mitral valve  regurgitation. No evidence of mitral valve stenosis.   Tricuspid Valve: The tricuspid valve is normal in structure. Tricuspid  valve regurgitation is mild to moderate. No evidence of tricuspid  stenosis.   Aortic Valve: The aortic valve is tricuspid. There is moderate  calcification of the aortic valve. There is moderate thickening of the  aortic valve. Aortic valve regurgitation is mild. Mild to moderate aortic  valve sclerosis/calcification is present,  without any evidence of aortic stenosis.   Pulmonic Valve: The pulmonic valve was normal in structure. Pulmonic valve  regurgitation is trivial. No evidence of pulmonic stenosis.   Aorta: The aortic root is normal in size and structure. There is mild  dilatation at the level of the sinuses of Valsalva, measuring 37 mm. There   is mild dilatation of the ascending aorta, measuring 42 mm.   Venous: The inferior vena cava is dilated in size with greater than 50%  respiratory variability, suggesting right atrial pressure of 8 mmHg.   IAS/Shunts: No atrial level shunt detected by color flow Doppler.     LEFT VENTRICLE  PLAX 2D  LVIDd:     4.10 cm  LVIDs:     2.60 cm  LV PW:     1.20 cm  LV IVS:    1.30 cm  LVOT diam:   2.30 cm  LV SV:     88  LV SV Index:  41  LVOT Area:   4.15 cm     RIGHT VENTRICLE       IVC  RV S prime:   13.60 cm/s IVC diam: 2.50 cm  TAPSE (M-mode): 1.6 cm   LEFT ATRIUM       Index  RIGHT ATRIUM      Index  LA diam:    4.20 cm 1.97 cm/m RA Area:   19.50 cm  LA Vol (A2C):  50.5 ml 23.69 ml/m RA Volume:  55.30 ml 25.94 ml/m  LA Vol (A4C):  71.4 ml 33.49 ml/m  LA Biplane Vol: 64.9 ml 30.44 ml/m  AORTIC VALVE  LVOT Vmax:  96.00 cm/s  LVOT Vmean: 66.400 cm/s  LVOT VTI:  0.212 m    AORTA  Ao Root diam: 3.70 cm  Ao Asc diam: 4.20 cm   TRICUSPID VALVE  TR Peak grad:  56.6 mmHg  TR Vmax:    376.00 cm/s    SHUNTS  Systemic VTI: 0.21 m  Systemic Diam: 2.30 cm   CARDIAC CATHETERIZATION 1.  Severe calcific single-vessel coronary artery disease involving a severely calcified eccentric proximal LAD lesion 2.  Patent left main, patent left circumflex with moderate nonobstructive stenosis, patent RCA with moderate nonobstructive stenosis 3.  Moderate pulmonary hypertension with normal pulmonary capillary wedge pressure/LVEDP 4.  Unsuccessful orbital atherectomy due to inability to fully treat the lesion.  Recommendations: Recommend another attempt at PCI from femoral access once the patient recovers from this procedure.  Hopefully femoral access will give better guide support and allow for completion of the procedure.  I think that the lack of contralateral aortic support and steep angulation of the left  main/LAD origin contributed to technical challenges with this case.  Plan reviewed with the patient.  Continue aspirin and clopidogrel.  REPEAT CARDIAC CATHETERIZATION  Prox LAD lesion is 90% stenosed.  Prox LAD to Mid LAD lesion is 60% stenosed.  A drug-eluting stent was successfully placed using a STENT RESOLUTE ONYX 3.5X30.  Post intervention, there is a 0% residual stenosis.  Post intervention, there is a 0% residual stenosis.   1. Successful PCI of the proximal to mid LAD with orbital atherectomy and stenting with DES x 1 and IVUS guidance  Plan: DAPT for one year. Anticipate DC tomorrow if no complications.    Antimicrobials:  Anti-infectives (From admission, onward)   Start     Dose/Rate Route Frequency Ordered Stop   01/07/21 2330  cefTRIAXone (ROCEPHIN) 1 g in sodium chloride 0.9 % 100 mL IVPB        1 g 200 mL/hr over 30 Minutes Intravenous Every 24 hours 01/07/21 2325 01/11/21 2252       Subjective: Seen and examined at bedside and had come back from his cardiac cath and had no chest pain or shortness of breath.  Denied any lightheadedness or dizziness.  Felt okay.  Hoping to go home tomorrow.  Objective: Vitals:   01/13/21 1245 01/13/21 1250 01/13/21 1324 01/13/21 1535  BP: (!) 141/80 (!) 143/75 137/83 128/72  Pulse: 80 81 84 80  Resp: 19 17 18 16   Temp:   98.6 F (37 C)   TempSrc:   Oral   SpO2: 92% 95% 96% 96%  Weight:      Height:        Intake/Output Summary (Last 24 hours) at 01/13/2021 1558 Last data filed at 01/13/2021 1446 Gross per 24 hour  Intake 1501.83 ml  Output 1950 ml  Net -448.17 ml   Filed Weights   01/10/21 0655 01/11/21 0511 01/13/21 0356  Weight: 79.1 kg 76.1 kg 76.9 kg   Examination: Physical Exam:  Constitutional: WN/WD elderly Caucasian male in no acute distress and appears calm comfortable Eyes: Lids and conjunctivae normal, sclerae anicteric  ENMT: External Ears, Nose appear  normal. Grossly normal hearing.  Neck:  Appears normal, supple, no cervical masses, normal ROM, no appreciable thyromegaly; no JVD Respiratory: Diminished to auscultation bilaterally, no wheezing, rales, rhonchi or crackles. Normal respiratory effort and patient is not tachypenic. No accessory muscle use.  Unlabored breathing supplemental oxygen via nasal cannula Cardiovascular: RRR, no murmurs / rubs / gallops. S1 and S2 auscultated.  No LE edema Abdomen: Soft, non-tender, non-distended.  Bowel sounds positive x4.  GU: Deferred. Musculoskeletal: No clubbing / cyanosis of digits/nails. No joint deformity upper and lower extremities.  Skin: No rashes, lesions, ulcers on a limited skin evaluation. No induration; Warm and dry.  Neurologic: CN 2-12 grossly intact with no focal deficits. Romberg sign on a limited cerebellar reflexes not assessed.  Psychiatric: Normal judgment and insight. Alert and oriented x 3. Normal mood and appropriate affect.   Data Reviewed: I have personally reviewed following labs and imaging studies  CBC: Recent Labs  Lab 01/09/21 0234 01/10/21 0304 01/11/21 0219 01/11/21 0904 01/11/21 0918 01/12/21 0256 01/13/21 0251 01/13/21 1505  WBC 8.3 12.2* 12.6*  --   --  10.6* 10.2 11.1*  NEUTROABS 7.5 10.5* 10.8*  --   --  9.7* 8.1*  --   HGB 11.1* 11.2* 11.4* 12.6* 11.9* 10.7* 11.1* 10.8*  HCT 34.4* 35.1* 37.3* 37.0* 35.0* 35.5* 35.6* 33.7*  MCV 76.6* 76.6* 76.4*  --   --  78.0* 77.9* 77.3*  PLT 338 359 362  --   --  327 333 767   Basic Metabolic Panel: Recent Labs  Lab 01/08/21 0339 01/09/21 0234 01/10/21 0304 01/11/21 0219 01/11/21 0904 01/11/21 0918 01/12/21 0256 01/13/21 0251 01/13/21 1505  NA 134* 135 134* 133* 135 135 133* 135  --   K 4.9 4.2 3.9 3.8 4.1 3.9 5.0 5.1  --   CL 106 103 98 93*  --   --  100 99  --   CO2 18* 21* 23 26  --   --  25 26  --   GLUCOSE 287* 308* 308* 268*  --   --  216* 193*  --   BUN 15 20 26* 28*  --   --  26* 32*  --   CREATININE 1.10 1.04 1.21 1.15  --   --   0.99 1.05 0.99  CALCIUM 8.5* 9.0 9.0 9.2  --   --  8.9 9.4  --   MG 1.6* 1.9  --  1.8  --   --  2.0 1.9  --   PHOS 4.5  --   --   --   --   --  3.5 3.4  --    GFR: Estimated Creatinine Clearance: 70.1 mL/min (by C-G formula based on SCr of 0.99 mg/dL). Liver Function Tests: Recent Labs  Lab 01/07/21 1408 01/08/21 0339 01/12/21 0256 01/13/21 0251  AST 20 14* 20 17  ALT 17 16 21 22   ALKPHOS 54 47 50 53  BILITOT 0.4 0.7 0.7 0.6  PROT 6.5 5.7* 5.5* 5.7*  ALBUMIN 3.4* 2.9* 3.0* 3.0*   No results for input(s): LIPASE, AMYLASE in the last 168 hours. No results for input(s): AMMONIA in the last 168 hours. Coagulation Profile: No results for input(s): INR, PROTIME in the last 168 hours. Cardiac Enzymes: No results for input(s): CKTOTAL, CKMB, CKMBINDEX, TROPONINI in the last 168 hours. BNP (last 3 results) No results for input(s): PROBNP in the last 8760 hours. HbA1C: No results for input(s): HGBA1C in the last 72 hours. CBG: Recent Labs  Lab 01/12/21 1722 01/12/21 2124 01/13/21 1047 01/13/21 1330 01/13/21 1358  GLUCAP 395* 426* 127* 67* 73   Lipid Profile: No results for input(s): CHOL, HDL, LDLCALC, TRIG, CHOLHDL, LDLDIRECT in the last 72 hours. Thyroid Function Tests: No results for input(s): TSH, T4TOTAL, FREET4, T3FREE, THYROIDAB in the last 72 hours. Anemia Panel: No results for input(s): VITAMINB12, FOLATE, FERRITIN, TIBC, IRON, RETICCTPCT in the last 72 hours. Sepsis Labs: No results for input(s): PROCALCITON, LATICACIDVEN in the last 168 hours.  Recent Results (from the past 240 hour(s))  Resp Panel by RT-PCR (Flu A&B, Covid) Nasopharyngeal Swab     Status: None   Collection Time: 01/07/21  3:41 PM   Specimen: Nasopharyngeal Swab; Nasopharyngeal(NP) swabs in vial transport medium  Result Value Ref Range Status   SARS Coronavirus 2 by RT PCR NEGATIVE NEGATIVE Final    Comment: (NOTE) SARS-CoV-2 target nucleic acids are NOT DETECTED.  The SARS-CoV-2 RNA is  generally detectable in upper respiratory specimens during the acute phase of infection. The lowest concentration of SARS-CoV-2 viral copies this assay can detect is 138 copies/mL. A negative result does not preclude SARS-Cov-2 infection and should not be used as the sole basis for treatment or other patient management decisions. A negative result may occur with  improper specimen collection/handling, submission of specimen other than nasopharyngeal swab, presence of viral mutation(s) within the areas targeted by this assay, and inadequate number of viral copies(<138 copies/mL). A negative result must be combined with clinical observations, patient history, and epidemiological information. The expected result is Negative.  Fact Sheet for Patients:  EntrepreneurPulse.com.au  Fact Sheet for Healthcare Providers:  IncredibleEmployment.be  This test is no t yet approved or cleared by the [place] FDA and  has been authorized for detection and/or diagnosis of SARS-CoV-2 by FDA under an Emergency Use Authorization (EUA). This EUA will remain  in effect (meaning this test can be used) for the duration of the COVID-19 declaration under Section 564(b)(1) of the Act, 21 U.S.C.section 360bbb-3(b)(1), unless the authorization is terminated  or revoked sooner.       Influenza A by PCR NEGATIVE NEGATIVE Final   Influenza B by PCR NEGATIVE NEGATIVE Final    Comment: (NOTE) The Xpert Xpress SARS-CoV-2/FLU/RSV plus assay is intended as an aid in the diagnosis of influenza from Nasopharyngeal swab specimens and should not be used as a sole basis for treatment. Nasal washings and aspirates are unacceptable for Xpert Xpress SARS-CoV-2/FLU/RSV testing.  Fact Sheet for Patients: EntrepreneurPulse.com.au  Fact Sheet for Healthcare Providers: IncredibleEmployment.be  This test is not yet approved or cleared by the [place] FDA and has been authorized for detection and/or diagnosis of SARS-CoV-2 by FDA under an Emergency Use Authorization (EUA). This EUA will remain in effect (meaning this test can be used) for the duration of the COVID-19 declaration under Section 564(b)(1) of the Act, 21 U.S.C. section 360bbb-3(b)(1), unless the authorization is terminated or revoked.  Performed at [facility], [place] [address] [place], [place] [address]   MRSA PCR Screening     Status: None   Collection Time: 01/09/21 12:17 AM   Specimen: Nasal Mucosa; Nasopharyngeal  Result Value Ref Range Status   MRSA by PCR NEGATIVE NEGATIVE Final    Comment:        The GeneXpert MRSA Assay (FDA approved for NASAL specimens only), is one component of a comprehensive MRSA colonization surveillance program. It is not intended to diagnose MRSA infection nor to guide or monitor  treatment for MRSA infections. Performed at [facility], [place] [address], [place], [place] [address]      RN Pressure Injury Documentation: Pressure Injury 01/08/21 Ear Left Stage 2 -  Partial thickness loss of dermis presenting as a shallow open injury with a red, pink wound bed without slough. Under Oxygen Tubing from Home O2 (Active)  01/08/21 1800  Location: Ear  Location Orientation: Left  Staging: Stage 2 -  Partial thickness loss of dermis presenting as a shallow open injury with a red, pink wound bed without slough.  Wound Description (Comments): Under Oxygen Tubing from Home O2  Present on Admission: Yes     Estimated body mass index is 19.59 kg/m as calculated from the following:   Height as of this encounter: 6\' 6"  (1.981 m).   Weight as of this encounter: 76.9 kg.  Malnutrition Type:  Nutrition Problem: Moderate Malnutrition Etiology: chronic illness (COPD)  Malnutrition Characteristics:  Signs/Symptoms: mild fat depletion,moderate fat depletion,mild muscle depletion,moderate muscle depletion  Nutrition  Interventions:  Interventions: MVI,Ensure Enlive (each supplement provides 350kcal and 20 grams of protein)   Radiology Studies: CARDIAC CATHETERIZATION  Result Date: 01/13/2021  Prox LAD lesion is 90% stenosed.  Prox LAD to Mid LAD lesion is 60% stenosed.  A drug-eluting stent was successfully placed using a STENT RESOLUTE ONYX 3.5X30.  Post intervention, there is a 0% residual stenosis.  Post intervention, there is a 0% residual stenosis.  1. Successful PCI of the proximal to mid LAD with orbital atherectomy and stenting with DES x 1 and IVUS guidance Plan: DAPT for one year. Anticipate DC tomorrow if no complications.   Scheduled Meds: . aspirin  81 mg Oral Daily  . atorvastatin  40 mg Oral QHS  . bisoprolol  5 mg Oral Daily  . clopidogrel  75 mg Oral Daily  . diltiazem  60 mg Oral Q6H  . [START ON 01/14/2021] enoxaparin (LOVENOX) injection  40 mg Subcutaneous Q24H  . feeding supplement (GLUCERNA SHAKE)  237 mL Oral BID BM  . ferrous sulfate  325 mg Oral BID WC  . insulin aspart  0-5 Units Subcutaneous QHS  . insulin aspart  0-9 Units Subcutaneous TID WC  . insulin aspart  5 Units Subcutaneous TID WC  . insulin glargine  25 Units Subcutaneous Daily  . ipratropium-albuterol  3 mL Nebulization BID  . magnesium oxide  400 mg Oral Daily  . mometasone-formoterol  2 puff Inhalation BID  . multivitamin with minerals  1 tablet Oral Daily  . nortriptyline  50 mg Oral QHS  . oxybutynin  10 mg Oral Daily  . pantoprazole  40 mg Oral Daily  . Ensure Max Protein  11 oz Oral QHS  . sodium chloride flush  3 mL Intravenous Q12H  . sodium chloride flush  3 mL Intravenous Q12H  . sodium chloride flush  3 mL Intravenous Q12H  . vitamin B-12  1,000 mcg Oral Daily   Continuous Infusions: . sodium chloride    . sodium chloride    . sodium chloride 1 mL/kg/hr (01/13/21 1534)    LOS: 6 days   Kerney Elbe, DO [facility] PAGER is on [place]  If 7PM-7AM, please contact  night-coverage [email or web address]

## 2021-01-13 NOTE — Interval H&P Note (Signed)
History and Physical Interval Note:  01/13/2021 7:43 AM  Brandon [name]  has presented today for surgery, with the diagnosis of cad.  The various methods of treatment have been discussed with the patient and family. After consideration of risks, benefits and other options for treatment, the patient has consented to  Procedure(s): CORONARY STENT INTERVENTION (N/A) as a surgical intervention.  The patient's history has been reviewed, patient examined, no change in status, stable for surgery.  I have reviewed the patient's chart and labs.  Questions were answered to the patient's satisfaction.   Cath Lab Visit (complete for each Cath Lab visit)  Clinical Evaluation Leading to the Procedure:   ACS: Yes.    Non-ACS:    Anginal Classification: CCS IV  Anti-ischemic medical therapy: Maximal Therapy (2 or more classes of medications)  Non-Invasive Test Results: No non-invasive testing performed  Prior CABG: No previous CABG        Collier Salina [facility] 01/13/2021 7:43 AM

## 2021-01-13 NOTE — Progress Notes (Signed)
Hypoglycemic Event  CBG: 67  Treatment: 4oz juice and crackers  Symptoms: Asymptomatic  Follow-up CBG: Time: 1358 CBG Result: 73  Possible Reasons for Event: NPO     Brandon [name]

## 2021-01-13 NOTE — Progress Notes (Signed)
Site area: Right groin a 7 french long arterial sheath was removed  Site Prior to Removal:  Level 0  Pressure Applied For 20 MINUTES    Bedrest Beginning at 1250p X 4 hours  Manual:   Yes.    Patient Status During Pull:  stable  Post Pull Groin Site:  Level 0  Post Pull Instructions Given:  Yes.    Post Pull Pulses Present:  Yes.    Dressing Applied:  Yes.    Comments:

## 2021-01-13 NOTE — Progress Notes (Signed)
PT Cancellation Note  Patient Details Name: Brandon [name] MRN: [id number] DOB: 1946-10-27   Cancelled Treatment:    Reason Eval/Treat Not Completed: (P) Patient at procedure or test/unavailable (Pt in cathlab and will be on bed rest when he returns, Will defer PT this date and f/u per POC.)   Meldrick Buttery J Schara 01/13/2021, 10:57 AM  Erasmo Leventhal , PTA Acute Rehabilitation Services Pager [phone] Office [phone]

## 2021-01-13 NOTE — Progress Notes (Signed)
Progress Note  Patient Name: Brandon [name] Date of Encounter: 01/13/2021  [facility] Cardiologist: Follows at [place]   Right and left heart cath recently that showed  Severe, calcified proximal LAD stenosis of 90%. Atherectomy was attempted but ultimately unsuccessful due to angulation of left main/LAD origin and lack of contralateral aortic support.   Today he had PCI from the femoral artery.  Dr. Martinique was successful in performing an arthrectomy with stenting of the proximal LAD.  The final result looks great.  The patient is not having any pain following the procedure.  Inpatient Medications    Scheduled Meds: . [MAR Hold] aspirin  81 mg Oral Daily  . [MAR Hold] atorvastatin  40 mg Oral QHS  . [MAR Hold] bisoprolol  5 mg Oral Daily  . [MAR Hold] clopidogrel  75 mg Oral Daily  . [MAR Hold] diltiazem  60 mg Oral Q6H  . [MAR Hold] enoxaparin (LOVENOX) injection  40 mg Subcutaneous Q24H  . [MAR Hold] feeding supplement (GLUCERNA SHAKE)  237 mL Oral BID BM  . [MAR Hold] ferrous sulfate  325 mg Oral BID WC  . [MAR Hold] insulin aspart  0-5 Units Subcutaneous QHS  . [MAR Hold] insulin aspart  0-9 Units Subcutaneous TID WC  . [MAR Hold] insulin aspart  10 Units Subcutaneous TID WC  . [MAR Hold] insulin glargine  25 Units Subcutaneous Daily  . [MAR Hold] ipratropium-albuterol  3 mL Nebulization BID  . [MAR Hold] magnesium oxide  400 mg Oral Daily  . [MAR Hold] mometasone-formoterol  2 puff Inhalation BID  . [MAR Hold] multivitamin with minerals  1 tablet Oral Daily  . [MAR Hold] nortriptyline  50 mg Oral QHS  . [MAR Hold] oxybutynin  10 mg Oral Daily  . [MAR Hold] pantoprazole  40 mg Oral Daily  . [MAR Hold] Ensure Max Protein  11 oz Oral QHS  . [MAR Hold] sodium chloride flush  3 mL Intravenous Q12H  . [MAR Hold] sodium chloride flush  3 mL Intravenous Q12H  . sodium chloride flush  3 mL Intravenous Q12H  . [MAR Hold] vitamin B-12  1,000 mcg Oral Daily   Continuous  Infusions: . [MAR Hold] sodium chloride    . sodium chloride    . sodium chloride     PRN Meds: [MAR Hold] sodium chloride, sodium chloride, [MAR Hold] acetaminophen **OR** [MAR Hold] acetaminophen, [MAR Hold] albuterol, [MAR Hold] bisacodyl, [MAR Hold] HYDROcodone-acetaminophen, [MAR Hold] ondansetron (ZOFRAN) IV, [MAR Hold] senna-docusate, [MAR Hold] sodium chloride flush, sodium chloride flush   Vital Signs    Vitals:   01/13/21 0915 01/13/21 0930 01/13/21 0935 01/13/21 0940  BP:  (!) 146/80 (!) 143/70 136/73  Pulse: (!) 0 76 74 74  Resp:  18 17 18   Temp:      TempSrc:      SpO2: (!) 0% 98% 97% 96%  Weight:      Height:        Intake/Output Summary (Last 24 hours) at 01/13/2021 0945 Last data filed at 01/13/2021 0618 Gross per 24 hour  Intake 800.75 ml  Output 1450 ml  Net -649.25 ml   Last 3 Weights 01/13/2021 01/11/2021 01/10/2021  Weight (lbs) 169 lb 8.5 oz 167 lb 12.3 oz 174 lb 6.1 oz  Weight (kg) 76.9 kg 76.1 kg 79.1 kg      Telemetry    Sinus rhythm HR 80-90s - Personally Reviewed  ECG    Sinus rhythm with PACs HR 85 -  Personally Reviewed  Physical Exam   Physical Exam: Blood pressure 136/73, pulse 74, temperature 98.3 F (36.8 C), temperature source Oral, resp. rate 18, height 6\' 6"  (1.981 m), weight 76.9 kg, SpO2 96 %.  GEN:  Well nourished, well developed in no acute distress HEENT: Normal NECK: No JVD; No carotid bruits LYMPHATICS: No lymphadenopathy CARDIAC: RRR , no murmurs, rubs, gallops RESPIRATORY:  Clear to auscultation without rales, wheezing or rhonchi  ABDOMEN: Soft, non-tender, non-distended MUSCULOSKELETAL:   RFA sheath in place  SKIN: Warm and dry NEUROLOGIC:  Alert and oriented x 3   Labs    High Sensitivity Troponin:   Recent Labs  Lab 01/07/21 2300 01/08/21 1227 01/08/21 2108 01/09/21 0816 01/09/21 1017  TROPONINIHS 33* 343* 1,258* 1,140* 982*      Chemistry Recent Labs  Lab 01/08/21 0339 01/09/21 0234 01/11/21 0219  01/11/21 0904 01/11/21 0918 01/12/21 0256 01/13/21 0251  NA 134*   < > 133*   < > 135 133* 135  K 4.9   < > 3.8   < > 3.9 5.0 5.1  CL 106   < > 93*  --   --  100 99  CO2 18*   < > 26  --   --  25 26  GLUCOSE 287*   < > 268*  --   --  216* 193*  BUN 15   < > 28*  --   --  26* 32*  CREATININE 1.10   < > 1.15  --   --  0.99 1.05  CALCIUM 8.5*   < > 9.2  --   --  8.9 9.4  PROT 5.7*  --   --   --   --  5.5* 5.7*  ALBUMIN 2.9*  --   --   --   --  3.0* 3.0*  AST 14*  --   --   --   --  20 17  ALT 16  --   --   --   --  21 22  ALKPHOS 47  --   --   --   --  50 53  BILITOT 0.7  --   --   --   --  0.7 0.6  GFRNONAA >60   < > >60  --   --  >60 >60  ANIONGAP 10   < > 14  --   --  8 10   < > = values in this interval not displayed.     Hematology Recent Labs  Lab 01/11/21 0219 01/11/21 0904 01/11/21 0918 01/12/21 0256 01/13/21 0251  WBC 12.6*  --   --  10.6* 10.2  RBC 4.88  --   --  4.55 4.57  HGB 11.4*   < > 11.9* 10.7* 11.1*  HCT 37.3*   < > 35.0* 35.5* 35.6*  MCV 76.4*  --   --  78.0* 77.9*  MCH 23.4*  --   --  23.5* 24.3*  MCHC 30.6  --   --  30.1 31.2  RDW 21.4*  --   --  21.2* 21.1*  PLT 362  --   --  327 333   < > = values in this interval not displayed.    BNP Recent Labs  Lab 01/07/21 1408 01/10/21 0304  BNP 337.4* 346.0*     DDimer  Recent Labs  Lab 01/08/21 2108  DDIMER 0.89*     Radiology    CARDIAC CATHETERIZATION  Result Date: 01/13/2021  Prox LAD lesion is 90% stenosed.  Prox LAD to Mid LAD lesion is 60% stenosed.  A drug-eluting stent was successfully placed using a STENT RESOLUTE ONYX 3.5X30.  Post intervention, there is a 0% residual stenosis.  Post intervention, there is a 0% residual stenosis.  1. Successful PCI of the proximal to mid LAD with orbital atherectomy and stenting with DES x 1 and IVUS guidance Plan: DAPT for one year. Anticipate DC tomorrow if no complications.    Cardiac Studies   Left heart cath 01/11/21: 1.  Severe  calcific single-vessel coronary artery disease involving a severely calcified eccentric proximal LAD lesion 2.  Patent left main, patent left circumflex with moderate nonobstructive stenosis, patent RCA with moderate nonobstructive stenosis 3.  Moderate pulmonary hypertension with normal pulmonary capillary wedge pressure/LVEDP 4.  Unsuccessful orbital atherectomy due to inability to fully treat the lesion.  Recommendations: Recommend another attempt at PCI from femoral access once the patient recovers from this procedure.  Hopefully femoral access will give better guide support and allow for completion of the procedure.  I think that the lack of contralateral aortic support and steep angulation of the left main/LAD origin contributed to technical challenges with this case.  Plan reviewed with the patient.  Continue aspirin and clopidogrel.   Echo 01/09/21: 1. Left ventricular ejection fraction, by estimation, is 60 to 65%. The  left ventricle has normal function. The left ventricle has no regional  wall motion abnormalities. There is mild concentric left ventricular  hypertrophy and moderate basal septal  hypertrophy.  2. Right ventricular systolic function is moderately reduced. The right  ventricular size is moderately enlarged. There is severely elevated  pulmonary artery systolic pressure. The estimated right ventricular  systolic pressure is 69.6 mmHg.  3. The mitral valve is degenerative. No evidence of mitral valve  regurgitation. No evidence of mitral stenosis.  4. The inferior vena cava is dilated in size with >50% respiratory  variability, suggesting right atrial pressure of 8 mmHg.  5. The aortic valve is tricuspid. There is moderate calcification of the  aortic valve primarily of the left coronary cusp. There is moderate  thickening of the aortic valve. Aortic valve regurgitation is mild. Mild  to moderate aortic valve  sclerosis/calcification is present, without any  evidence of aortic  stenosis.  6. There is mild dilatation at the level of the sinuses of Valsalva,  measuring 37 mm. There is mild dilatation of the ascending aorta,  measuring 42 mm.  7. Tricuspid valve regurgitation is mild to moderate.  8. Right atrial size was moderately dilated.   Patient Profile     75 y.o. 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 Cardiology consulted for elevated troponin.  Assessment & Plan    Acute on chronic hypoxic respiratory failure - baseline O2 is 4L, required 10L on presentation because she was out of oxygen - COPD exacerbation per primary - CT with COPD and enlarging pleural effusions Further management per primary team    Moderately reduced RV function Severe pulmonary arterial hypertension Mild to moderate TR - EF 60-65%, mild LVH, severely elevated PA pressure - has received 40 mg IV lasix x 4 doses, overall net negative 3.6 L,  weight is 167 lbs   Cont current meds.   NSTEMI CAD - hs troponin 49 --> 39 --> 33 --> 343 --> 1258 --< 1140 --> 982 S/p PCI of a severe, calcified prox LAD stenosis    Paroxysmal atrial tachycardia - cardizem 60 mg q6hr - heart rates have been controlled - no indication for anticoagulation - telemetry with sinus rhythm and PACs - consider consolidating cardizem to 240 mg daily   Hx of CVA 09/2020 - pt reports taking ASA and plavix - TTE 09/2020 could not exclude PFO, evidence fo atrial septal aneurysm - no monitor in Epic, may have completed one at [facility]   Thoracic aorta aneurysm - stable - 4.8 cm on CT  09/2020 --> 4.7 cm on CT this admission (measured  4.2 cm on echo) - will need monitoring - [facility] to [facility] - good BP control     Mertie Moores, MD  01/13/2021 9:48 AM    [facility] [address],  [place], [place]  [address] Phone: [phone]; Fax: [phone]

## 2021-01-13 NOTE — TOC Initial Note (Signed)
Transition of Care [facility]) - Initial/Assessment Note    Patient Details  Name: Brandon [name] MRN: [id number] Date of Birth: 01-Aug-1946  [facility] of [facility]) CM/SW Contact:    Bethena Roys, RN Phone Number: 01/13/2021, 5:11 PM  Clinical Narrative:  Patient presented for respiratory distress. Prior to arrival patient was from home alone. Patient provided Case Manager verbal permission to call his daughter in [place]. Case Manager spoke with daughter and she could not tell me the agency that the patient was active with for home health services. Patient was previously at [facility]- reached out to [facility] and never received a call back. Case Manager called [facility] and they will service the patient for RN/PT Services. Will need orders and F2F.  [facility] will need to get [facility] authorization for services. If the [place] states the patient is active with another agency- [facility] will reach out to that agency. Daughter was unable to state who the patient gets oxygen with as well. Daughter states she will be in [place] to visit the patient. Hopefully the daughter will then see the durable medical equipment agency. Patient has a cane and rolling walker at home. No durable medical equipment needed at this time. Case Manager will continue to follow.    Expected Discharge Plan: [place] Barriers to Discharge: Continued Medical Work up   Patient Goals and CMS Choice Patient states their goals for this hospitalization and ongoing recovery are:: to return home (Pt states he is active with an agency, could not remember agency name.)      Expected Discharge Plan and Services Expected Discharge Plan: [place] In-house Referral: NA Discharge Planning Services: CM Consult Post Acute Care Choice: [place] arrangements for the past 2 months: Single Family Home                 DME Arranged: N/A DME Agency: NA       HH  Arranged: RN,Disease Management,PT [place] Agency: [facility]) Date HH Agency Contacted: 01/13/21 Time HH Agency Contacted: 1700 Representative spoke with at [place]: [place] Arrangements/Services Living arrangements for the past 2 months: [place] with:: Self (daughter visits on Saturdays and calls daily.) Patient language and need for interpreter reviewed:: Yes Do you feel safe going back to the place where you live?: Yes      Need for Family Participation in Patient Care: Yes (Comment) Care giver support system in place?: Yes (comment) Current home services: DME (Pt has a rolling walker and cane- 02 at 4 L cant remeber the company) Criminal Activity/Legal Involvement Pertinent to Current Situation/Hospitalization: No - Comment as needed  Activities of Daily Living Home Assistive Devices/Equipment: Oxygen ADL Screening (condition at time of admission) Patient's cognitive ability adequate to safely complete daily activities?: Yes Is the patient deaf or have difficulty hearing?: No Does the patient have difficulty seeing, even when wearing glasses/contacts?: No Does the patient have difficulty concentrating, remembering, or making decisions?: No Patient able to express need for assistance with ADLs?: Yes Does the patient have difficulty dressing or bathing?: No Independently performs ADLs?: No Communication: Independent Dressing (OT): Independent Grooming: Independent with device (comment) Feeding: Independent Bathing: Independent with device (comment) Toileting: Independent In/Out Bed: Independent Walks in Home: Independent Does the patient have difficulty walking or climbing stairs?: No Weakness of Legs: None Weakness of Arms/Hands: None  Permission Sought/Granted Permission sought to share information with : Facility Contact  Representative,Case Manager Permission granted to share information with : Yes, Verbal Permission  Granted              Emotional Assessment Appearance:: Appears stated age Attitude/Demeanor/Rapport: Engaged Affect (typically observed): Appropriate Orientation: : Oriented to Situation,Oriented to  Time,Oriented to Place,Oriented to Self Alcohol / Substance Use: Not Applicable Psych Involvement: No (comment)  Admission diagnosis:  COPD exacerbation ([place]) [J44.1] Acute on chronic respiratory failure with hypoxia ([place]) [J96.21] Patient Active Problem List   Diagnosis Date Noted   Pressure injury of skin 01/09/2021   Malnutrition of moderate degree 01/09/2021   Elevated troponin 01/07/2021   COPD with acute exacerbation ([place]) 01/07/2021   History of CVA (cerebrovascular accident) 01/07/2021   COPD exacerbation ([place]) 01/07/2021   Hypomagnesemia 01/07/2021   Essential hypertension    Type 2 diabetes mellitus ([place])    CAD (coronary artery disease)    GERD (gastroesophageal reflux disease)    Acute on chronic respiratory failure with hypoxemia (HCC)    COPD (chronic obstructive pulmonary disease) ([place])    Tobacco abuse    Hyponatremia    Hyperkalemia    Ischemic stroke ([place])    PCP:  Pcp, No Pharmacy:   [place], [place] - [address] [place] [address] Phone: [phone] Fax: [phone]  Readmission Risk Interventions No flowsheet data found.

## 2021-01-14 DIAGNOSIS — I214 Non-ST elevation (NSTEMI) myocardial infarction: Secondary | ICD-10-CM | POA: Diagnosis not present

## 2021-01-14 DIAGNOSIS — I2510 Atherosclerotic heart disease of native coronary artery without angina pectoris: Secondary | ICD-10-CM | POA: Diagnosis not present

## 2021-01-14 DIAGNOSIS — J441 Chronic obstructive pulmonary disease with (acute) exacerbation: Secondary | ICD-10-CM | POA: Diagnosis not present

## 2021-01-14 DIAGNOSIS — R778 Other specified abnormalities of plasma proteins: Secondary | ICD-10-CM | POA: Diagnosis not present

## 2021-01-14 DIAGNOSIS — J9621 Acute and chronic respiratory failure with hypoxia: Secondary | ICD-10-CM | POA: Diagnosis not present

## 2021-01-14 LAB — BASIC METABOLIC PANEL
Anion gap: 7 (ref 5–15)
BUN: 20 mg/dL (ref 8–23)
CO2: 28 mmol/L (ref 22–32)
Calcium: 8.6 mg/dL — ABNORMAL LOW (ref 8.9–10.3)
Chloride: 98 mmol/L (ref 98–111)
Creatinine, Ser: 0.97 mg/dL (ref 0.61–1.24)
GFR, Estimated: >60 mL/min (ref >60)
Glucose, Bld: 118 mg/dL — ABNORMAL HIGH (ref 70–99)
Potassium: 4.2 mmol/L (ref 3.5–5.1)
Sodium: 133 mmol/L — ABNORMAL LOW (ref 135–145)

## 2021-01-14 LAB — CBC
HCT: 33.7 % — ABNORMAL LOW (ref 39.0–52.0)
Hemoglobin: 10.2 g/dL — ABNORMAL LOW (ref 13.0–17.0)
MCH: 23.7 pg — ABNORMAL LOW (ref 26.0–34.0)
MCHC: 30.3 g/dL (ref 30.0–36.0)
MCV: 78.2 fL — ABNORMAL LOW (ref 80.0–100.0)
Platelets: 319 10*3/uL (ref 150–400)
RBC: 4.31 MIL/uL (ref 4.22–5.81)
RDW: 20.8 % — ABNORMAL HIGH (ref 11.5–15.5)
WBC: 10.2 10*3/uL (ref 4.0–10.5)
nRBC: 0 % (ref 0.0–0.2)

## 2021-01-14 LAB — GLUCOSE, CAPILLARY
Glucose-Capillary: 113 mg/dL — ABNORMAL HIGH (ref 70–99)
Glucose-Capillary: 210 mg/dL — ABNORMAL HIGH (ref 70–99)

## 2021-01-14 MED ORDER — ENSURE MAX PROTEIN PO LIQD: 11.0000 [oz_av] | Freq: Every day | ORAL | 0 refills | Status: DC

## 2021-01-14 MED ORDER — PANTOPRAZOLE SODIUM 40 MG PO TBEC: 40.0000 mg | DELAYED_RELEASE_TABLET | Freq: Every day | ORAL | 0 refills | Status: DC

## 2021-01-14 MED ORDER — BISOPROLOL FUMARATE 5 MG PO TABS: 5.0000 mg | ORAL_TABLET | Freq: Every day | ORAL | 0 refills | Status: DC

## 2021-01-14 MED ORDER — GLUCERNA SHAKE PO LIQD: 237.0000 mL | Freq: Two times a day (BID) | ORAL | 0 refills | Status: DC

## 2021-01-14 MED ORDER — SENNOSIDES-DOCUSATE SODIUM 8.6-50 MG PO TABS: 1.0000 | ORAL_TABLET | Freq: Every evening | ORAL | 0 refills | Status: DC | PRN

## 2021-01-14 MED ORDER — ADULT MULTIVITAMIN W/MINERALS CH: 1.0000 | ORAL_TABLET | Freq: Every day | ORAL | 0 refills | Status: DC

## 2021-01-14 MED ORDER — DILTIAZEM HCL ER COATED BEADS 240 MG PO CP24: 240.0000 mg | ORAL_CAPSULE | Freq: Every day | ORAL | 0 refills | Status: DC

## 2021-01-14 MED ORDER — MAGNESIUM OXIDE 400 (241.3 MG) MG PO TABS: 400.0000 mg | ORAL_TABLET | Freq: Every day | ORAL | 0 refills | Status: DC

## 2021-01-14 MED ORDER — DILTIAZEM HCL ER COATED BEADS 240 MG PO CP24
240.0000 mg | ORAL_CAPSULE | Freq: Every day | ORAL | Status: DC
  Administered 2021-01-14: 240 mg via ORAL
  Filled 2021-01-14: qty 1

## 2021-01-14 MED ORDER — INSULIN ASPART PROT & ASPART (70-30 MIX) 100 UNIT/ML PEN: 20.0000 [IU] | PEN_INJECTOR | Freq: Two times a day (BID) | SUBCUTANEOUS | 11 refills | Status: DC

## 2021-01-14 MED ORDER — ACETAMINOPHEN 325 MG PO TABS: 650.0000 mg | ORAL_TABLET | Freq: Four times a day (QID) | ORAL | 0 refills | Status: DC | PRN

## 2021-01-14 MED ORDER — FERROUS SULFATE 325 (65 FE) MG PO TABS: 325.0000 mg | ORAL_TABLET | Freq: Two times a day (BID) | ORAL | 0 refills | Status: DC

## 2021-01-14 MED ORDER — CYANOCOBALAMIN 1000 MCG PO TABS: 1000.0000 ug | ORAL_TABLET | Freq: Every day | ORAL | 0 refills | Status: DC

## 2021-01-14 NOTE — Discharge Summary (Signed)
Physician Discharge Summary  Bretton Mayne [id number] DOB: [birth year] DOA: 01/07/2021  PCP: Pcp, No  Admit date: 01/07/2021 Discharge date: 01/14/2021  Admitted From: Home Disposition: Home with Home Health   Recommendations for Outpatient Follow-up:  1. Follow up with PCP in 1-2 weeks 2. Follow up with Cardiology at the [facility] 3. Follow up with Pulmonary at the [facility] 4. Follow-up for his thoracic aortic aneurysm as an outpatient with cardiology at the [facility] 5. Repeat CXR in 3-6 weeks  6. Please obtain CMP/CBC, Mag, Phos in one week 7. Please follow up on the following pending results:  Home Health: No Equipment/Devices: None    Discharge Condition: Stable CODE STATUS: FULL CODE  Diet recommendation: Heart Healthy Carb Modified Diet   Brief/Interim Summary: HPI per Dr. 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 Underwent a R/LHC today and showed "Severe calcific single-vessel coronary artery disease involving a severely calcified eccentric proximal LAD lesion 2. Patent left main, patent left circumflex with moderate nonobstructive stenosis, patent RCA with moderate nonobstructive stenosis 3. Moderate pulmonary hypertension with normal pulmonary capillary wedge pressure/LVEDP 4. Unsuccessful orbital atherectomy due to inability to fully treat the lesion." Now Cardiology will recommend  repeating Heart Cath from a femoral site and this will be done 01/13/21 and this was done and he is s/p PCI of Severe, Calcified Proximal LAD Stenosis. He improved and Cardiology cleared the patient for D/C. He will need DAPT for 1 year. PT/OT Recommending Home Health.   Discharge Diagnoses:  Active Problems:   Essential hypertension   Type 2 diabetes mellitus (HCC)   CAD (coronary artery disease)   GERD (gastroesophageal reflux disease)   Acute on chronic respiratory failure with hypoxemia (HCC)   Tobacco abuse   Hyponatremia   Elevated troponin   COPD with acute exacerbation (HCC)   History of CVA (cerebrovascular accident)   COPD exacerbation (HCC)   Hypomagnesemia   Pressure injury of skin   Malnutrition of moderate degree  Acute on chronic Hypoxic Respiratory Failure Possibly COPD with acute exacerbation -On 4 L of O2 at baseline, required about 10L on presentation -Currently afebrile, with leukocytosis(steroids) -Chest x-ray with evidence of emphysematous changes -D-dimer elevated, CTA chest with no evidence of PE -Covid, flu panel negative -Continue Duoneb 3 mL Neb BID, inhalers with Dulera 2 piff IH , steroids now stopped, Mucinex -Continue ceftriaxone for total of 5 days -SpO2: 96 % O2 Flow Rate (L/min): 5 L/min -Continue supplemental oxygen, BiPAP as needed -Continue to Monitor closely -Given IV Lasix 40 mg BID x 4 Doses and now stopped -Continue to Monitor Respiratory Status carefully -Repeat CXR in the AM   NSTEMI with CAD Severe Pulmonary Arterial Hypertension Elevated Troponin Mild to Moderate TR -EKG with no acute ST changes -Tropnin went from 49 -> 39 -> 33 -> 343 -> 1258 -> 1140 -Echo showed EF of 60 to 65%, no regional wall motion abnormality,  noted PAH pressure 64.6 mmHg -Cardiology on board, plan for cardiac cath on 01/11/2021 but will need to be done again on Friday from a Femoral Site -Radial Cath showed "1. Severe calcific single-vessel coronary  artery disease involving a severely calcified eccentric proximal LAD lesion 2. Patent left main, patent left circumflex with moderate nonobstructive stenosis, patent RCA with moderate nonobstructive stenosis 3. Moderate pulmonary hypertension with normal pulmonary capillary wedge pressure/LVEDP 4. Unsuccessful orbital atherectomy due to inability to fully treat the lesion." -Lasix now stopped; Cardizem (as rec by cardiology) -Repeat Cath on 01/13/21 done and showed Successful PCI of the proximal to mid LAD with orbital atherectomy and stenting with DES x 1 and IVUS guidance -C/w Telemetry -Cardiology recommending DAPT x 1 year [facility]  Thoracic aortic aneurysm -Stable on recent CT scan -Further care per [facility] cardiology and vascular surgery  Diabetes Mellitus Type 2 -Last A1c 6.4 on 01/07/2021, poor control, currently 2/2 steroid use -C/w Sensistive Novolog SSI AC/HS, Lantus 25 mg sq Daily and Novolog 8 units sq TIDwm and increased to 10 Units TID if eats at least 50% of his meals; Now will reduce to 5 units TID given hypoglycemic Episode while hospitalized -Resume Home medications with adjustments made and further care per [facility] for blood Sugar Titration -C/w Accu-Cheks, hypoglycemic protocol -Diabetes coordinator, appreciate recs -CBG's ranging from 73-255  Hypomagnesemia -Mag Level is now 1.9 yesterday  -Continue with po Mag Oxide 400 mg po Daily  -Continue to Monitor and Replete as Necessary -Repeat Mag Level in the AM   Microcytic Anemia/Vitamin B12 Deficiency -Hemoglobin somewhat around baseline -Anemia panel showed iron 14, sats 4, B12 141 -Hgb/Hct went from 11.2/35.1 -> 11.4/37.3 -> 10.7/35.5 -> 11.1/35.6 -> 10.8/33.7 today it is 10.2/33.3 -Oral iron and vitamin B12 supplementation -Continue to Monitor and Trend  -Repeat CBC in the AM    Hypertension -BP stable and last BP was 109/56 -Continue Diltiazem 60 mg po q6h and cardiology consolidated this to  diltiazem to 40 mg p.o. daily and will be discharged on this -Continue to Monitor BP per Protocol   Leukocytosis -In the setting of Steroid Demargination -Patient's WBC went from 12.2 -> 12.6 -> 10.6 -> 10.2 -> 11.1 and today it is improved to 10.2 -Continue to Monitor for S/Sx of Infection -Repeat CBC in the AM  History of CAD/CVA -Currently chest pain-free -Continue Aspirin 81 mg po daily, Atorvastatin 40 mg po Daily, Clopidogrel 75 mg po daily,  -He was also switched to Bisoprolol 5 mg po Daily due to COPD exacerbation -C/w DAPT for 1 year  GERD -Continue Pantoprazole 40 mg po Daily   Moderate Malnutrition in the Context of Chronic illness -Nutritionist consulted for further evaluation  -Estimated body mass index is 19.59 kg/m as calculated from the following:   Height as of this encounter: 6\' 6"  (1.981 m).   Weight as of this encounter: 76.9 kg. -C/w Glucerna Shake po BID, Ensure Max po Daily and MVI + Minerals Daily   Discharge Instructions  Discharge Instructions    (HEART FAILURE PATIENTS) Call MD:  Anytime you have any of the following symptoms: 1) 3 pound weight gain in 24 hours or 5 pounds in 1 week 2) shortness of breath, with or without a dry hacking cough 3) swelling in the hands, feet or stomach 4) if you have to sleep on extra pillows at night in order to breathe.   Complete by: As directed    Amb Referral to Cardiac Rehabilitation  Complete by: As directed    Diagnosis:  NSTEMI Coronary Stents     After initial evaluation and assessments completed: Virtual Based Care may be provided alone or in conjunction with Phase 2 Cardiac Rehab based on patient barriers.: Yes   Call MD for:  difficulty breathing, headache or visual disturbances   Complete by: As directed    Call MD for:  extreme fatigue   Complete by: As directed    Call MD for:  hives   Complete by: As directed    Call MD for:  persistant dizziness or light-headedness   Complete by: As directed     Call MD for:  persistant nausea and vomiting   Complete by: As directed    Call MD for:  redness, tenderness, or signs of infection (pain, swelling, redness, odor or green/yellow discharge around incision site)   Complete by: As directed    Call MD for:  severe uncontrolled pain   Complete by: As directed    Call MD for:  temperature >100.4   Complete by: As directed    Diet - low sodium heart healthy   Complete by: As directed    Diet Carb Modified   Complete by: As directed    Discharge instructions   Complete by: As directed    You were cared for by a hospitalist during your hospital stay. If you have any questions about your discharge medications or the care you received while you were in the hospital after you are discharged, you can call the unit and ask to speak with the hospitalist on call if the hospitalist that took care of you is not available. Once you are discharged, your primary care physician will handle any further medical issues. Please note that NO REFILLS for any discharge medications will be authorized once you are discharged, as it is imperative that you return to your primary care physician (or establish a relationship with a primary care physician if you do not have one) for your aftercare needs so that they can reassess your need for medications and monitor your lab values.  Follow up with PCP, Cardiology and Pulmonary at the [facility]. Take all medications as prescribed and Take Dual Antiplatelet Therapy for 1 year. If symptoms change or worsen please return to the ED for evaluation   Increase activity slowly   Complete by: As directed    No wound care   Complete by: As directed      Allergies as of 01/14/2021      Reactions   Hctz [hydrochlorothiazide]       Medication List    STOP taking these medications   amLODipine 10 MG tablet Commonly known as: NORVASC   cilostazol 50 MG tablet Commonly known as: PLETAL   enalapril 20 MG tablet Commonly known as:  VASOTEC   metoprolol succinate 100 MG 24 hr tablet Commonly known as: TOPROL-XL   omeprazole 20 MG capsule Commonly known as: PRILOSEC Replaced by: pantoprazole 40 MG tablet     TAKE these medications   acetaminophen 325 MG tablet Commonly known as: TYLENOL Take 2 tablets (650 mg total) by mouth every 6 (six) hours as needed for mild pain (or Fever >/= 101).   albuterol 108 (90 Base) MCG/ACT inhaler Commonly known as: VENTOLIN HFA Inhale 2 puffs into the lungs every 6 (six) hours as needed for wheezing or shortness of breath.   aspirin 81 MG EC tablet Take 1 tablet (81 mg total) by mouth daily.  atorvastatin 40 MG tablet Commonly known as: LIPITOR Take 40 mg by mouth at bedtime.   bisoprolol 5 MG tablet Commonly known as: ZEBETA Take 1 tablet (5 mg total) by mouth daily.   clopidogrel 75 MG tablet Commonly known as: PLAVIX Take 1 tablet (75 mg total) by mouth daily.   cyanocobalamin 1000 MCG tablet Take 1 tablet (1,000 mcg total) by mouth daily.   diltiazem 240 MG 24 hr capsule Commonly known as: CARDIZEM CD Take 1 capsule (240 mg total) by mouth daily.   feeding supplement (GLUCERNA SHAKE) Liqd Take 237 mLs by mouth 2 (two) times daily between meals.   Ensure Max Protein Liqd Take 330 mLs (11 oz total) by mouth at bedtime.   ferrous sulfate 325 (65 FE) MG tablet Take 1 tablet (325 mg total) by mouth 2 (two) times daily with a meal.   Fluticasone-Salmeterol 250-50 MCG/DOSE Aepb Commonly known as: ADVAIR Inhale 1 puff into the lungs 2 (two) times daily.   insulin aspart protamine - aspart (70-30) 100 UNIT/ML FlexPen Commonly known as: NOVOLOG 70/30 MIX Inject 0.2 mLs (20 Units total) into the skin 2 (two) times daily with a meal. What changed: how much to take   magnesium oxide 400 (241.3 Mg) MG tablet Commonly known as: MAG-OX Take 1 tablet (400 mg total) by mouth daily.   metFORMIN 1000 MG tablet Commonly known as: GLUCOPHAGE Take 1,000 mg by mouth 2  (two) times daily with a meal.   multivitamin with minerals Tabs tablet Take 1 tablet by mouth daily.   Omega-3 1000 MG Caps Take 1,000 mg by mouth in the morning and at bedtime.   oxybutynin 10 MG 24 hr tablet Commonly known as: DITROPAN-XL Take 10 mg by mouth daily.   pantoprazole 40 MG tablet Commonly known as: PROTONIX Take 1 tablet (40 mg total) by mouth daily. Replaces: omeprazole 20 MG capsule   senna-docusate 8.6-50 MG tablet Commonly known as: Senokot-S Take 1 tablet by mouth at bedtime as needed for mild constipation.   Tiotropium Bromide Monohydrate 2.5 MCG/ACT Aers Inhale 2 puffs into the lungs daily.       Follow-up Information    [facility]-Home Follow up.   Specialty: Home Health Services Why: Registered Nurse and Physical Therapy-office to call with visit time.              Allergies  Allergen Reactions  . Hctz [Hydrochlorothiazide]     Consultations:  Cardiology  Procedures/Studies: CT ANGIO CHEST PE W OR WO CONTRAST  Result Date: 01/09/2021 CLINICAL DATA:  COPD, short of breath. Prostate cancer. Rule out pulmonary embolism. EXAM: CT ANGIOGRAPHY CHEST WITH CONTRAST TECHNIQUE: Multidetector CT imaging of the chest was performed using the standard protocol during bolus administration of intravenous contrast. Multiplanar CT image reconstructions and MIPs were obtained to evaluate the vascular anatomy. CONTRAST:  74mL OMNIPAQUE IOHEXOL 350 MG/ML SOLN COMPARISON:  CT chest 10/13/2020 FINDINGS: Cardiovascular: Negative for pulmonary embolism. Pulmonary arteries diffusely enlarged compatible with pulmonary artery hypertension from COPD. Main pulmonary artery 3.8 cm in diameter. Atherosclerotic calcification aortic arch. Ascending aorta dilated at 4.7 cm unchanged from the prior study. No dissection. Extensive coronary artery calcification involving right and left coronary arteries. Heart size upper normal. No pericardial effusion.  Mediastinum/Nodes: Negative for mass or adenopathy. Lungs/Pleura: Mild to moderate bilateral pleural effusions right greater than left with progression from the prior study. Mild dependent atelectasis in both lung bases. Underlying COPD with emphysema most notably in the apices. No  acute infiltrate or mass in the lungs. Upper Abdomen: Liver cyst 6 x 4 cm unchanged from the prior study. Atherosclerotic calcification abdominal aorta and renal arteries. Musculoskeletal: Multilevel thoracic disc degeneration and spurring. Sclerotic findings in the vertebral bodies appear degenerative. Negative for fracture. Review of the MIP images confirms the above findings. IMPRESSION: 1. Negative for pulmonary embolism 2. COPD with pulmonary artery hypertension 3. Enlarging bilateral pleural effusions right greater than left. 4. Aortic aneurysm NOS (ICD10-I71.9). 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 5. Extensive coronary calcification. Aortic Atherosclerosis (ICD10-I70.0) and Emphysema (ICD10-J43.9). Electronically Signed   By: Franchot Gallo M.D.   On: 01/09/2021 09:13   CARDIAC CATHETERIZATION  Result Date: 01/13/2021  Prox LAD lesion is 90% stenosed.  Prox LAD to Mid LAD lesion is 60% stenosed.  A drug-eluting stent was successfully placed using a STENT RESOLUTE ONYX 3.5X30.  Post intervention, there is a 0% residual stenosis.  Post intervention, there is a 0% residual stenosis.  1. Successful PCI of the proximal to mid LAD with orbital atherectomy and stenting with DES x 1 and IVUS guidance Plan: DAPT for one year. Anticipate DC tomorrow if no complications.   CARDIAC CATHETERIZATION  Result Date: 01/11/2021 1.  Severe calcific single-vessel coronary artery disease involving a severely calcified eccentric proximal LAD lesion 2.  Patent left main, patent left circumflex with moderate nonobstructive stenosis, patent RCA with moderate nonobstructive stenosis 3.  Moderate pulmonary hypertension with normal  pulmonary capillary wedge pressure/LVEDP 4.  Unsuccessful orbital atherectomy due to inability to fully treat the lesion. Recommendations: Recommend another attempt at PCI from femoral access once the patient recovers from this procedure.  Hopefully femoral access will give better guide support and allow for completion of the procedure.  I think that the lack of contralateral aortic support and steep angulation of the left main/LAD origin contributed to technical challenges with this case.  Plan reviewed with the patient.  Continue aspirin and clopidogrel.  DG Chest Port 1 View  Result Date: 01/07/2021 CLINICAL DATA:  Shortness of breath for 2 days. EXAM: PORTABLE CHEST 1 VIEW COMPARISON:  October 13, 2020 FINDINGS: Increased bilateral interstitial lung markings in the right mid lower lung in the periphery of the left mid lower lung. These findings are not significantly changed since October 13, 2020. A CT scan at that time demonstrated emphysematous changes without infiltrate. No pneumothorax. No nodules or masses. Stable cardiomegaly. The hila and mediastinum are unchanged. IMPRESSION: No definite interval change in the appearance of the chest. Chronic increased interstitial lung markings remain, particularly in the right mid and lower lung, noted to represent emphysematous changes on previous CT imaging. A subtle acute on chronic process may be difficult to identify in the background of these chronic changes. Electronically Signed   By: Dorise Bullion III M.D   On: 01/07/2021 15:58   ECHOCARDIOGRAM COMPLETE  Result Date: 01/09/2021    ECHOCARDIOGRAM REPORT   Patient Name:   NATHANYEL BURCHILL Date of Exam: 01/09/2021 Medical Rec #:  [id number]     Height:       78.0 in Accession #:    [id number]    Weight:       174.8 lb Date of Birth:  1946-04-22     BSA:          2.132 m Patient Age:    32 years      BP:           113/65 mmHg Patient Gender: M  HR:           93 bpm. Exam Location:  Inpatient  Procedure: 2D Echo Indications:    elevated troponin  History:        Patient has prior history of Echocardiogram examinations, most                 recent 10/14/2020. COPD; Risk Factors:Diabetes.  Sonographer:    Johny Chess Referring Phys: [place]  1. Left ventricular ejection fraction, by estimation, is 60 to 65%. The left ventricle has normal function. The left ventricle has no regional wall motion abnormalities. There is mild concentric left ventricular hypertrophy and moderate basal septal hypertrophy.  2. Right ventricular systolic function is moderately reduced. The right ventricular size is moderately enlarged. There is severely elevated pulmonary artery systolic pressure. The estimated right ventricular systolic pressure is [id number] mmHg.  3. The mitral valve is degenerative. No evidence of mitral valve regurgitation. No evidence of mitral stenosis.  4. The inferior vena cava is dilated in size with >50% respiratory variability, suggesting right atrial pressure of 8 mmHg.  5. The aortic valve is tricuspid. There is moderate calcification of the aortic valve primarily of the left coronary cusp. There is moderate thickening of the aortic valve. Aortic valve regurgitation is mild. Mild to moderate aortic valve sclerosis/calcification is present, without any evidence of aortic stenosis.  6. There is mild dilatation at the level of the sinuses of Valsalva, measuring 37 mm. There is mild dilatation of the ascending aorta, measuring 42 mm.  7. Tricuspid valve regurgitation is mild to moderate.  8. Right atrial size was moderately dilated. FINDINGS  Left Ventricle: Left ventricular ejection fraction, by estimation, is 65 to 70%. The left ventricle has normal function. The left ventricle has no regional wall motion abnormalities. The left ventricular internal cavity size was normal in size. There is  mild concentric left ventricular hypertrophy. Left ventricular diastolic function  could not be evaluated. Right Ventricle: The right ventricular size is moderately enlarged. No increase in right ventricular wall thickness. Right ventricular systolic function is moderately reduced. There is severely elevated pulmonary artery systolic pressure. The tricuspid regurgitant velocity is 3.76 m/s, and with an assumed right atrial pressure of 8 mmHg, the estimated right ventricular systolic pressure is Q000111Q mmHg. Left Atrium: Left atrial size was normal in size. Right Atrium: Right atrial size was moderately dilated. Pericardium: There is no evidence of pericardial effusion. Mitral Valve: The mitral valve is degenerative in appearance. There is mild thickening of the mitral valve leaflet(s). There is mild calcification of the mitral valve leaflet(s). Mild to moderate mitral annular calcification. No evidence of mitral valve regurgitation. No evidence of mitral valve stenosis. Tricuspid Valve: The tricuspid valve is normal in structure. Tricuspid valve regurgitation is mild to moderate. No evidence of tricuspid stenosis. Aortic Valve: The aortic valve is tricuspid. There is moderate calcification of the aortic valve. There is moderate thickening of the aortic valve. Aortic valve regurgitation is mild. Mild to moderate aortic valve sclerosis/calcification is present, without any evidence of aortic stenosis. Pulmonic Valve: The pulmonic valve was normal in structure. Pulmonic valve regurgitation is trivial. No evidence of pulmonic stenosis. Aorta: The aortic root is normal in size and structure. There is mild dilatation at the level of the sinuses of Valsalva, measuring 37 mm. There is mild dilatation of the ascending aorta, measuring 42 mm. Venous: The inferior vena cava is dilated in size with greater than 50% respiratory variability, suggesting right  atrial pressure of 8 mmHg. IAS/Shunts: No atrial level shunt detected by color flow Doppler.  LEFT VENTRICLE PLAX 2D LVIDd:         4.10 cm LVIDs:          2.60 cm LV PW:         1.20 cm LV IVS:        1.30 cm LVOT diam:     2.30 cm LV SV:         88 LV SV Index:   41 LVOT Area:     4.15 cm  RIGHT VENTRICLE             IVC RV S prime:     13.60 cm/s  IVC diam: 2.50 cm TAPSE (M-mode): 1.6 cm LEFT ATRIUM             Index       RIGHT ATRIUM           Index LA diam:        4.20 cm 1.97 cm/m  RA Area:     19.50 cm LA Vol (A2C):   50.5 ml 23.69 ml/m RA Volume:   55.30 ml  25.94 ml/m LA Vol (A4C):   71.4 ml 33.49 ml/m LA Biplane Vol: 64.9 ml 30.44 ml/m  AORTIC VALVE LVOT Vmax:   96.00 cm/s LVOT Vmean:  66.400 cm/s LVOT VTI:    0.212 m  AORTA Ao Root diam: 3.70 cm Ao Asc diam:  4.20 cm TRICUSPID VALVE TR Peak grad:   56.6 mmHg TR Vmax:        376.00 cm/s  SHUNTS Systemic VTI:  0.21 m Systemic Diam: 2.30 cm Armanda Magic MD Electronically signed by Armanda Magic MD Signature Date/Time: 01/09/2021/10:24:02 AM    Final      Subjective: Seen and examined and had no complaints and denied any chest pain or shortness of breath.  Felt okay and was medically stable to be discharged today.  No nausea or vomiting.  No other concerns or complaint at this time.  Discharge Exam: Vitals:   01/14/21 0745 01/14/21 0816  BP:  (!) 109/56  Pulse: 79 83  Resp: 20 (!) 21  Temp:  98.3 F (36.8 C)  SpO2: 97% 95%   Vitals:   01/13/21 2020 01/14/21 0500 01/14/21 0745 01/14/21 0816  BP: 120/71 113/70  (!) 109/56  Pulse: 81 71 79 83  Resp: 17 18 20  (!) 21  Temp: 97.8 F (36.6 C) 98.1 F (36.7 C)  98.3 F (36.8 C)  TempSrc: Oral Oral  Oral  SpO2: 97% 95% 97% 95%  Weight:  75.1 kg    Height:       General: Pt is alert, awake, not in acute distress Cardiovascular: RRR, S1/S2 +, no rubs, no gallops Respiratory: Managed bilaterally, no wheezing, no rhonchi; wearing supplemental oxygen via nasal cannula Abdominal: Soft, NT, ND, bowel sounds + Extremities: no appreciable edema, no cyanosis  The results of significant diagnostics from this hospitalization (including  imaging, microbiology, ancillary and laboratory) are listed below for reference.    Microbiology: Recent Results (from the past 240 hour(s))  Resp Panel by RT-PCR (Flu A&B, Covid) Nasopharyngeal Swab     Status: None   Collection Time: 01/07/21  3:41 PM   Specimen: Nasopharyngeal Swab; Nasopharyngeal(NP) swabs in vial transport medium  Result Value Ref Range Status   SARS Coronavirus 2 by RT PCR NEGATIVE NEGATIVE Final    Comment: (NOTE) SARS-CoV-2 target nucleic acids are NOT  DETECTED.  The SARS-CoV-2 RNA is generally detectable in upper respiratory specimens during the acute phase of infection. The lowest concentration of SARS-CoV-2 viral copies this assay can detect is 138 copies/mL. A negative result does not preclude SARS-Cov-2 infection and should not be used as the sole basis for treatment or other patient management decisions. A negative result may occur with  improper specimen collection/handling, submission of specimen other than nasopharyngeal swab, presence of viral mutation(s) within the areas targeted by this assay, and inadequate number of viral copies(<138 copies/mL). A negative result must be combined with clinical observations, patient history, and epidemiological information. The expected result is Negative.  Fact Sheet for Patients:  EntrepreneurPulse.com.au  Fact Sheet for Healthcare Providers:  IncredibleEmployment.be  This test is no t yet approved or cleared by the [place] FDA and  has been authorized for detection and/or diagnosis of SARS-CoV-2 by FDA under an Emergency Use Authorization (EUA). This EUA will remain  in effect (meaning this test can be used) for the duration of the COVID-19 declaration under Section 564(b)(1) of the Act, 21 U.S.C.section 360bbb-3(b)(1), unless the authorization is terminated  or revoked sooner.       Influenza A by PCR NEGATIVE NEGATIVE Final   Influenza B by PCR NEGATIVE  NEGATIVE Final    Comment: (NOTE) The Xpert Xpress SARS-CoV-2/FLU/RSV plus assay is intended as an aid in the diagnosis of influenza from Nasopharyngeal swab specimens and should not be used as a sole basis for treatment. Nasal washings and aspirates are unacceptable for Xpert Xpress SARS-CoV-2/FLU/RSV testing.  Fact Sheet for Patients: EntrepreneurPulse.com.au  Fact Sheet for Healthcare Providers: IncredibleEmployment.be  This test is not yet approved or cleared by the [place] FDA and has been authorized for detection and/or diagnosis of SARS-CoV-2 by FDA under an Emergency Use Authorization (EUA). This EUA will remain in effect (meaning this test can be used) for the duration of the COVID-19 declaration under Section 564(b)(1) of the Act, 21 U.S.C. section 360bbb-3(b)(1), unless the authorization is terminated or revoked.  Performed at [facility], [place] [address], [place], [place] [address]   MRSA PCR Screening     Status: None   Collection Time: 01/09/21 12:17 AM   Specimen: Nasal Mucosa; Nasopharyngeal  Result Value Ref Range Status   MRSA by PCR NEGATIVE NEGATIVE Final    Comment:        The GeneXpert MRSA Assay (FDA approved for NASAL specimens only), is one component of a comprehensive MRSA colonization surveillance program. It is not intended to diagnose MRSA infection nor to guide or monitor treatment for MRSA infections. Performed at [facility], [place] [address], [place], [place] [address]     Labs: BNP (last 3 results) Recent Labs    10/13/20 1215 01/07/21 1408 01/10/21 0304  BNP 178.9* 337.4* 123456*   Basic Metabolic Panel: Recent Labs  Lab 01/08/21 0339 01/09/21 0234 01/10/21 0304 01/11/21 0219 01/11/21 0904 01/11/21 0918 01/12/21 0256 01/13/21 0251 01/13/21 1505 01/14/21 0323  NA 134* 135 134* 133* 135 135 133* 135  --  133*  K 4.9 4.2 3.9 3.8 4.1 3.9 5.0 5.1  --  4.2  CL 106 103 98  93*  --   --  100 99  --  98  CO2 18* 21* 23 26  --   --  25 26  --  28  GLUCOSE 287* 308* 308* 268*  --   --  216* 193*  --  118*  BUN  15 20 26* 28*  --   --  26* 32*  --  20  CREATININE 1.10 1.04 1.21 1.15  --   --  0.99 1.05 0.99 0.97  CALCIUM 8.5* 9.0 9.0 9.2  --   --  8.9 9.4  --  8.6*  MG 1.6* 1.9  --  1.8  --   --  2.0 1.9  --   --   PHOS 4.5  --   --   --   --   --  3.5 3.4  --   --    Liver Function Tests: Recent Labs  Lab 01/08/21 0339 01/12/21 0256 01/13/21 0251  AST 14* 20 17  ALT 16 21 22   ALKPHOS 47 50 53  BILITOT 0.7 0.7 0.6  PROT 5.7* 5.5* 5.7*  ALBUMIN 2.9* 3.0* 3.0*   No results for input(s): LIPASE, AMYLASE in the last 168 hours. No results for input(s): AMMONIA in the last 168 hours. CBC: Recent Labs  Lab 01/09/21 0234 01/10/21 0304 01/11/21 0219 01/11/21 0904 01/11/21 0918 01/12/21 0256 01/13/21 0251 01/13/21 1505 01/14/21 0323  WBC 8.3 12.2* 12.6*  --   --  10.6* 10.2 11.1* 10.2  NEUTROABS 7.5 10.5* 10.8*  --   --  9.7* 8.1*  --   --   HGB 11.1* 11.2* 11.4*   < > 11.9* 10.7* 11.1* 10.8* 10.2*  HCT 34.4* 35.1* 37.3*   < > 35.0* 35.5* 35.6* 33.7* 33.7*  MCV 76.6* 76.6* 76.4*  --   --  78.0* 77.9* 77.3* 78.2*  PLT 338 359 362  --   --  327 333 315 319   < > = values in this interval not displayed.   Cardiac Enzymes: No results for input(s): CKTOTAL, CKMB, CKMBINDEX, TROPONINI in the last 168 hours. BNP: Invalid input(s): POCBNP CBG: Recent Labs  Lab 01/13/21 1358 01/13/21 1645 01/13/21 2136 01/14/21 0800 01/14/21 1159  GLUCAP 73 164* 255* 113* 210*   D-Dimer No results for input(s): DDIMER in the last 72 hours. Hgb A1c No results for input(s): HGBA1C in the last 72 hours. Lipid Profile No results for input(s): CHOL, HDL, LDLCALC, TRIG, CHOLHDL, LDLDIRECT in the last 72 hours. Thyroid function studies No results for input(s): TSH, T4TOTAL, T3FREE, THYROIDAB in the last 72 hours.  Invalid input(s): FREET3 Anemia work up No results  for input(s): VITAMINB12, FOLATE, FERRITIN, TIBC, IRON, RETICCTPCT in the last 72 hours. Urinalysis    Component Value Date/Time   COLORURINE YELLOW 01/07/2021 2010   APPEARANCEUR CLEAR 01/07/2021 2010   LABSPEC 1.006 01/07/2021 2010   PHURINE 5.0 01/07/2021 2010   GLUCOSEU NEGATIVE 01/07/2021 2010   HGBUR NEGATIVE 01/07/2021 2010   [place] NEGATIVE 01/07/2021 2010   [place] NEGATIVE 01/07/2021 2010   PROTEINUR NEGATIVE 01/07/2021 2010   NITRITE NEGATIVE 01/07/2021 2010   LEUKOCYTESUR NEGATIVE 01/07/2021 2010   Sepsis Labs Invalid input(s): PROCALCITONIN,  WBC,  LACTICIDVEN Microbiology Recent Results (from the past 240 hour(s))  Resp Panel by RT-PCR (Flu A&B, Covid) Nasopharyngeal Swab     Status: None   Collection Time: 01/07/21  3:41 PM   Specimen: Nasopharyngeal Swab; Nasopharyngeal(NP) swabs in vial transport medium  Result Value Ref Range Status   SARS Coronavirus 2 by RT PCR NEGATIVE NEGATIVE Final    Comment: (NOTE) SARS-CoV-2 target nucleic acids are NOT DETECTED.  The SARS-CoV-2 RNA is generally detectable in upper respiratory specimens during the acute phase of infection. The lowest concentration of SARS-CoV-2 viral copies this assay can detect  is 138 copies/mL. A negative result does not preclude SARS-Cov-2 infection and should not be used as the sole basis for treatment or other patient management decisions. A negative result may occur with  improper specimen collection/handling, submission of specimen other than nasopharyngeal swab, presence of viral mutation(s) within the areas targeted by this assay, and inadequate number of viral copies(<138 copies/mL). A negative result must be combined with clinical observations, patient history, and epidemiological information. The expected result is Negative.  Fact Sheet for Patients:  EntrepreneurPulse.com.au  Fact Sheet for Healthcare Providers:   IncredibleEmployment.be  This test is no t yet approved or cleared by the [place] FDA and  has been authorized for detection and/or diagnosis of SARS-CoV-2 by FDA under an Emergency Use Authorization (EUA). This EUA will remain  in effect (meaning this test can be used) for the duration of the COVID-19 declaration under Section 564(b)(1) of the Act, 21 U.S.C.section 360bbb-3(b)(1), unless the authorization is terminated  or revoked sooner.       Influenza A by PCR NEGATIVE NEGATIVE Final   Influenza B by PCR NEGATIVE NEGATIVE Final    Comment: (NOTE) The Xpert Xpress SARS-CoV-2/FLU/RSV plus assay is intended as an aid in the diagnosis of influenza from Nasopharyngeal swab specimens and should not be used as a sole basis for treatment. Nasal washings and aspirates are unacceptable for Xpert Xpress SARS-CoV-2/FLU/RSV testing.  Fact Sheet for Patients: EntrepreneurPulse.com.au  Fact Sheet for Healthcare Providers: IncredibleEmployment.be  This test is not yet approved or cleared by the [place] FDA and has been authorized for detection and/or diagnosis of SARS-CoV-2 by FDA under an Emergency Use Authorization (EUA). This EUA will remain in effect (meaning this test can be used) for the duration of the COVID-19 declaration under Section 564(b)(1) of the Act, 21 U.S.C. section 360bbb-3(b)(1), unless the authorization is terminated or revoked.  Performed at [facility], [place] [address] [place], [place] [address]   MRSA PCR Screening     Status: None   Collection Time: 01/09/21 12:17 AM   Specimen: Nasal Mucosa; Nasopharyngeal  Result Value Ref Range Status   MRSA by PCR NEGATIVE NEGATIVE Final    Comment:        The GeneXpert MRSA Assay (FDA approved for NASAL specimens only), is one component of a comprehensive MRSA colonization surveillance program. It is not intended to diagnose MRSA infection nor  to guide or monitor treatment for MRSA infections. Performed at [facility], [place] [address], [place], [place] [address]    Time coordinating discharge: 35 minutes  SIGNED:  Kerney Elbe, DO [facility] 01/14/2021, 6:55 PM Pager is on [place]  If 7PM-7AM, please contact night-coverage [email or web address]

## 2021-01-14 NOTE — Progress Notes (Signed)
Provided Stent Card, printed materials including medications details and f/u appointment. Verbalized understanding.  Discharged Home on 4 L, Nasal Cannula to have [place]. Daughter provides transport. Leveda Anna, BSN, RN

## 2021-01-14 NOTE — TOC Transition Note (Addendum)
Transition of Care [facility]) - CM/SW Discharge Note   Patient Details  Name: Brandon [name] MRN: [id number] Date of Birth: 1946-07-21  Transition of Care [facility]) CM/SW Contact:  Zenon Mayo, RN Phone Number: 01/14/2021, 11:29 AM   Clinical Narrative:    NCM received call from MD stating patient has left his key at his home and the daughter has locked the house.  NCM contacted daughter informed her that patient is for dc today, she states she can be at his home in 2.5 hours to turn on his lights and his heat if we can provide transportation for him to get home. She will call this NCM to let know when she arrives.  NCM notified Corene Cornea with [facility], he states they are still waiting on auth from the [place] also and as soon as they get it they will go out to see patient. MD added HHOT, HHAIDE to [facility] /HHPT orders.  NCM notified Jasson with [facility].  1:28 NCM received call from daughter states she will be here to pick patient up at 2 pm to take him home instead of [place] getting transport for him.   Final next level of care: [place] Barriers to Discharge: No Barriers Identified   Patient Goals and CMS Choice Patient states their goals for this hospitalization and ongoing recovery are:: to return home (Pt states he is active with an agency, could not remember agency name.)      Discharge Placement                       Discharge Plan and Services In-house Referral: NA Discharge Planning Services: CM Consult Post Acute Care Choice: Home Health          DME Arranged: N/A DME Agency: NA       HH Arranged: RN,Disease Management,PT [place] Agency: [facility] ([facility]) Date HH Agency Contacted: 01/13/21 Time [place]: 1700 Representative spoke with at [place]: [place] ([place]) Interventions     Readmission Risk Interventions No flowsheet data found.

## 2021-01-14 NOTE — Progress Notes (Addendum)
Progress Note  Patient Name: Brandon [name] Date of Encounter: 01/14/2021  [facility] Cardiologist: Follows at [place]   Status post PCI of the LAD.  Was a calcified vessel and required orbital atherectomy.  He is currently feeling well and is without chest pain.    Inpatient Medications    Scheduled Meds: . aspirin  81 mg Oral Daily  . atorvastatin  40 mg Oral QHS  . bisoprolol  5 mg Oral Daily  . clopidogrel  75 mg Oral Daily  . diltiazem  60 mg Oral Q6H  . enoxaparin (LOVENOX) injection  40 mg Subcutaneous Q24H  . feeding supplement (GLUCERNA SHAKE)  237 mL Oral BID BM  . ferrous sulfate  325 mg Oral BID WC  . insulin aspart  0-5 Units Subcutaneous QHS  . insulin aspart  0-9 Units Subcutaneous TID WC  . insulin aspart  5 Units Subcutaneous TID WC  . insulin glargine  25 Units Subcutaneous Daily  . ipratropium-albuterol  3 mL Nebulization BID  . magnesium oxide  400 mg Oral Daily  . mometasone-formoterol  2 puff Inhalation BID  . multivitamin with minerals  1 tablet Oral Daily  . nortriptyline  50 mg Oral QHS  . oxybutynin  10 mg Oral Daily  . pantoprazole  40 mg Oral Daily  . Ensure Max Protein  11 oz Oral QHS  . sodium chloride flush  3 mL Intravenous Q12H  . sodium chloride flush  3 mL Intravenous Q12H  . sodium chloride flush  3 mL Intravenous Q12H  . vitamin B-12  1,000 mcg Oral Daily   Continuous Infusions: . sodium chloride    . sodium chloride     PRN Meds: sodium chloride, sodium chloride, acetaminophen **OR** acetaminophen, albuterol, bisacodyl, HYDROcodone-acetaminophen, ondansetron (ZOFRAN) IV, senna-docusate, sodium chloride flush, sodium chloride flush   Vital Signs    Vitals:   01/13/21 2020 01/14/21 0500 01/14/21 0745 01/14/21 0816  BP: 120/71 113/70  (!) 109/56  Pulse: 81 71 79 83  Resp: 17 18 20  (!) 21  Temp: 97.8 F (36.6 C) 98.1 F (36.7 C)  98.3 F (36.8 C)  TempSrc: Oral Oral  Oral  SpO2: 97% 95% 97% 95%  Weight:  75.1 kg     Height:        Intake/Output Summary (Last 24 hours) at 01/14/2021 0937 Last data filed at 01/14/2021 0518 Gross per 24 hour  Intake 1541.08 ml  Output 800 ml  Net 741.08 ml   Last 3 Weights 01/14/2021 01/13/2021 01/11/2021  Weight (lbs) 165 lb 9.6 oz 169 lb 8.5 oz 167 lb 12.3 oz  Weight (kg) 75.116 kg 76.9 kg 76.1 kg      Telemetry    Sinus rhythm- Personally Reviewed  ECG    Sinus rhythm, rate 75-personally reviewed  Physical Exam   GEN: Well nourished, well developed, in no acute distress  HEENT: normal  Neck: no JVD, carotid bruits, or masses Cardiac: RRR; no murmurs, rubs, or gallops,no edema  Respiratory:  clear to auscultation bilaterally, normal work of breathing GI: soft, nontender, nondistended, + BS MS: no deformity or atrophy  Skin: warm and dry Neuro:  Strength and sensation are intact Psych: euthymic mood, full affect    Labs    High Sensitivity Troponin:   Recent Labs  Lab 01/07/21 2300 01/08/21 1227 01/08/21 2108 01/09/21 0816 01/09/21 1017  TROPONINIHS 33* 343* 1,258* 1,140* 982*      Chemistry Recent Labs  Lab 01/08/21 0339 01/09/21  0234 01/12/21 0256 01/13/21 0251 01/13/21 1505 01/14/21 0323  NA 134*   < > 133* 135  --  133*  K 4.9   < > 5.0 5.1  --  4.2  CL 106   < > 100 99  --  98  CO2 18*   < > 25 26  --  28  GLUCOSE 287*   < > 216* 193*  --  118*  BUN 15   < > 26* 32*  --  20  CREATININE 1.10   < > 0.99 1.05 0.99 0.97  CALCIUM 8.5*   < > 8.9 9.4  --  8.6*  PROT 5.7*  --  5.5* 5.7*  --   --   ALBUMIN 2.9*  --  3.0* 3.0*  --   --   AST 14*  --  20 17  --   --   ALT 16  --  21 22  --   --   ALKPHOS 47  --  50 53  --   --   BILITOT 0.7  --  0.7 0.6  --   --   GFRNONAA >60   < > >60 >60 >60 >60  ANIONGAP 10   < > 8 10  --  7   < > = values in this interval not displayed.     Hematology Recent Labs  Lab 01/13/21 0251 01/13/21 1505 01/14/21 0323  WBC 10.2 11.1* 10.2  RBC 4.57 4.36 4.31  HGB 11.1* 10.8* 10.2*  HCT  35.6* 33.7* 33.7*  MCV 77.9* 77.3* 78.2*  MCH 24.3* 24.8* 23.7*  MCHC 31.2 32.0 30.3  RDW 21.1* 20.9* 20.8*  PLT 333 315 319    BNP Recent Labs  Lab 01/07/21 1408 01/10/21 0304  BNP 337.4* 346.0*     DDimer  Recent Labs  Lab 01/08/21 2108  DDIMER 0.89*     Radiology    CARDIAC CATHETERIZATION  Result Date: 01/13/2021  Prox LAD lesion is 90% stenosed.  Prox LAD to Mid LAD lesion is 60% stenosed.  A drug-eluting stent was successfully placed using a STENT RESOLUTE ONYX 3.5X30.  Post intervention, there is a 0% residual stenosis.  Post intervention, there is a 0% residual stenosis.  1. Successful PCI of the proximal to mid LAD with orbital atherectomy and stenting with DES x 1 and IVUS guidance Plan: DAPT for one year. Anticipate DC tomorrow if no complications.    Cardiac Studies   Left heart cath 01/11/21: 1.  Severe calcific single-vessel coronary artery disease involving a severely calcified eccentric proximal LAD lesion 2.  Patent left main, patent left circumflex with moderate nonobstructive stenosis, patent RCA with moderate nonobstructive stenosis 3.  Moderate pulmonary hypertension with normal pulmonary capillary wedge pressure/LVEDP 4.  Unsuccessful orbital atherectomy due to inability to fully treat the lesion.  Recommendations: Recommend another attempt at PCI from femoral access once the patient recovers from this procedure.  Hopefully femoral access Tonio Seider give better guide support and allow for completion of the procedure.  I think that the lack of contralateral aortic support and steep angulation of the left main/LAD origin contributed to technical challenges with this case.  Plan reviewed with the patient.  Continue aspirin and clopidogrel.   Echo 01/09/21: 1. Left ventricular ejection fraction, by estimation, is 60 to 65%. The  left ventricle has normal function. The left ventricle has no regional  wall motion abnormalities. There is mild concentric left  ventricular  hypertrophy and moderate basal septal  hypertrophy.  2. Right ventricular systolic function is moderately reduced. The right  ventricular size is moderately enlarged. There is severely elevated  pulmonary artery systolic pressure. The estimated right ventricular  systolic pressure is 55.3 mmHg.  3. The mitral valve is degenerative. No evidence of mitral valve  regurgitation. No evidence of mitral stenosis.  4. The inferior vena cava is dilated in size with >50% respiratory  variability, suggesting right atrial pressure of 8 mmHg.  5. The aortic valve is tricuspid. There is moderate calcification of the  aortic valve primarily of the left coronary cusp. There is moderate  thickening of the aortic valve. Aortic valve regurgitation is mild. Mild  to moderate aortic valve  sclerosis/calcification is present, without any evidence of aortic  stenosis.  6. There is mild dilatation at the level of the sinuses of Valsalva,  measuring 37 mm. There is mild dilatation of the ascending aorta,  measuring 42 mm.  7. Tricuspid valve regurgitation is mild to moderate.  8. Right atrial size was moderately dilated.   Patient Profile     75 y.o. 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 Cardiology consulted for elevated troponin.  Assessment & Plan    Acute on chronic hypoxic respiratory failure On 4 L of oxygen at baseline but required 10 L at presentation as he was out of oxygen.  COPD exacerbation per primary team.   Moderately reduced RV function Severe pulmonary arterial hypertension Mild to moderate TR EF 60 to 65% with  moderate LVH.  Continue current medications.  NSTEMI CAD High-sensitivity troponin peaked at 1258.  Had a calcified proximal LAD now status post PCI.  Tauri Ethington need dual antiplatelet therapy for 1 year.  Rekha Hobbins need follow-up with [facility]ology at the [facility].  Paroxysmal atrial tachycardia Heart rate is well controlled.  We Gaytha Raybourn consolidate diltiazem to 240 mg daily.   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   Thoracic aorta aneurysm Stable on recent CT.  Plan per [facility] cardiology.  No further cardiology work-up is needed at this time.  Please feel free to call us back if any further issues arise.  Shareen Capwell Meredith Leeds, MD  01/14/2021 9:37 AM    [place],  [place], [place]  [address] Phone: [phone]; Fax: [phone]

## 2021-01-14 NOTE — TOC Progression Note (Signed)
Transition of Care [facility]) - Progression Note    Patient Details  Name: Brandon [name] MRN: [id number] Date of Birth: 1946-04-23  Transition of Care [facility]) CM/SW Contact  Zenon Mayo, RN Phone Number: 01/14/2021, 11:17 AM  Clinical Narrative:    NCM received call from MD stating patient has left his key at his home and the daughter has locked the house.  NCM contacted daughter informed her that patient is for dc today, she states she can be at his home in 2.5 hours to turn on his lights and his heat if we can provide transportation for him to get home. She will call this NCM to let know when she arrives.    Expected Discharge Plan: [place] Barriers to Discharge: Continued Medical Work up  Expected Discharge Plan and Services Expected Discharge Plan: [place] In-house Referral: NA Discharge Planning Services: CM Consult Post Acute Care Choice: [place] arrangements for the past 2 months: Single Family Home                 DME Arranged: N/A DME Agency: NA       HH Arranged: RN,Disease Management,PT [place] Agency: [place] ([facility]) Date HH Agency Contacted: 01/13/21 Time [place]: 1700 Representative spoke with at [place]: [place] ([place]) Interventions    Readmission Risk Interventions No flowsheet data found.

## 2021-01-14 NOTE — Progress Notes (Signed)
PT Cancellation Note  Patient Details Name: Brandon [name] MRN: [id number] DOB: 1946/05/16   Cancelled Treatment:    Reason Eval/Treat Not Completed: Other (comment).  Pt reports he is not concerned about getting up the steps, declines today.  Encouraged him to let nursing know if he changes his mind.   Ramond Dial 01/14/2021, 11:34 AM   Mee Hives, PT MS Acute Rehab Dept. Number: [place] and [place]

## 2021-01-14 NOTE — Progress Notes (Signed)
CARDIAC REHAB PHASE I   PRE:  Rate/Rhythm: 84SR  BP:  Supine:   Sitting: 113/63     SaO2: 95% 6L   Pt was seated upright in bed eating breakfast. Asked pt if he would go for a walk in the hall and he declined, stating he did not want to 'right now'. Educated pt on heart healthy and diabetic diet, exercise guidelines, NTG use, Aspirin and Plavix, MI booklet, restrictions, and CRP II. Pt verbalized understanding. Pt has home health PT that visits him twice a week for rehab through the [place]. Pt lives alone and only has a brother locally in case of emergencies. Pt voiced that his daughter, who lives in [place], does not want him to go home yet, she does not think he is ready. Pt stated that he will talk to MD about that decision. Will send CRP II referral to [place]. Pt may also be eligible for Pulmonary Rehab based on COPD.  [place], MS, CEP

## 2021-02-01 ENCOUNTER — Inpatient Hospital Stay (HOSPITAL_COMMUNITY)
Admission: EM | Admit: 2021-02-01 | Discharge: 2021-02-06 | DRG: 190 | Disposition: A | Discharge status: Home Health | Payer: No Typology Code available for payment source | Attending: Internal Medicine | Admitting: Internal Medicine

## 2021-02-01 ENCOUNTER — Emergency Department (HOSPITAL_COMMUNITY): Payer: No Typology Code available for payment source

## 2021-02-01 ENCOUNTER — Encounter (HOSPITAL_COMMUNITY): Payer: Self-pay | Admitting: Internal Medicine

## 2021-02-01 ENCOUNTER — Encounter (HOSPITAL_COMMUNITY): Payer: Self-pay | Admitting: Pharmacy Technician

## 2021-02-01 ENCOUNTER — Other Ambulatory Visit: Payer: Self-pay

## 2021-02-01 DIAGNOSIS — K219 Gastro-esophageal reflux disease without esophagitis: Secondary | ICD-10-CM | POA: Diagnosis present

## 2021-02-01 DIAGNOSIS — E039 Hypothyroidism, unspecified: Secondary | ICD-10-CM | POA: Diagnosis present

## 2021-02-01 DIAGNOSIS — Z8249 Family history of ischemic heart disease and other diseases of the circulatory system: Secondary | ICD-10-CM | POA: Diagnosis not present

## 2021-02-01 DIAGNOSIS — J9621 Acute and chronic respiratory failure with hypoxia: Secondary | ICD-10-CM | POA: Diagnosis present

## 2021-02-01 DIAGNOSIS — Z8546 Personal history of malignant neoplasm of prostate: Secondary | ICD-10-CM

## 2021-02-01 DIAGNOSIS — I110 Hypertensive heart disease with heart failure: Secondary | ICD-10-CM | POA: Diagnosis present

## 2021-02-01 DIAGNOSIS — I5031 Acute diastolic (congestive) heart failure: Secondary | ICD-10-CM | POA: Diagnosis not present

## 2021-02-01 DIAGNOSIS — R0603 Acute respiratory distress: Secondary | ICD-10-CM

## 2021-02-01 DIAGNOSIS — I5033 Acute on chronic diastolic (congestive) heart failure: Secondary | ICD-10-CM | POA: Diagnosis present

## 2021-02-01 DIAGNOSIS — N3281 Overactive bladder: Secondary | ICD-10-CM | POA: Diagnosis present

## 2021-02-01 DIAGNOSIS — I5030 Unspecified diastolic (congestive) heart failure: Secondary | ICD-10-CM

## 2021-02-01 DIAGNOSIS — Z86010 Personal history of colonic polyps: Secondary | ICD-10-CM | POA: Diagnosis not present

## 2021-02-01 DIAGNOSIS — Z7984 Long term (current) use of oral hypoglycemic drugs: Secondary | ICD-10-CM

## 2021-02-01 DIAGNOSIS — E785 Hyperlipidemia, unspecified: Secondary | ICD-10-CM | POA: Diagnosis present

## 2021-02-01 DIAGNOSIS — E1151 Type 2 diabetes mellitus with diabetic peripheral angiopathy without gangrene: Secondary | ICD-10-CM | POA: Diagnosis present

## 2021-02-01 DIAGNOSIS — D509 Iron deficiency anemia, unspecified: Secondary | ICD-10-CM | POA: Diagnosis present

## 2021-02-01 DIAGNOSIS — I480 Paroxysmal atrial fibrillation: Secondary | ICD-10-CM | POA: Diagnosis present

## 2021-02-01 DIAGNOSIS — Z8673 Personal history of transient ischemic attack (TIA), and cerebral infarction without residual deficits: Secondary | ICD-10-CM

## 2021-02-01 DIAGNOSIS — I252 Old myocardial infarction: Secondary | ICD-10-CM

## 2021-02-01 DIAGNOSIS — I248 Other forms of acute ischemic heart disease: Secondary | ICD-10-CM | POA: Diagnosis present

## 2021-02-01 DIAGNOSIS — Z79899 Other long term (current) drug therapy: Secondary | ICD-10-CM

## 2021-02-01 DIAGNOSIS — Z20822 Contact with and (suspected) exposure to covid-19: Secondary | ICD-10-CM | POA: Diagnosis present

## 2021-02-01 DIAGNOSIS — I2510 Atherosclerotic heart disease of native coronary artery without angina pectoris: Secondary | ICD-10-CM | POA: Diagnosis present

## 2021-02-01 DIAGNOSIS — F1010 Alcohol abuse, uncomplicated: Secondary | ICD-10-CM | POA: Diagnosis present

## 2021-02-01 DIAGNOSIS — Z7982 Long term (current) use of aspirin: Secondary | ICD-10-CM

## 2021-02-01 DIAGNOSIS — K5904 Chronic idiopathic constipation: Secondary | ICD-10-CM | POA: Diagnosis present

## 2021-02-01 DIAGNOSIS — J441 Chronic obstructive pulmonary disease with (acute) exacerbation: Secondary | ICD-10-CM | POA: Diagnosis present

## 2021-02-01 DIAGNOSIS — Z888 Allergy status to other drugs, medicaments and biological substances status: Secondary | ICD-10-CM

## 2021-02-01 DIAGNOSIS — K861 Other chronic pancreatitis: Secondary | ICD-10-CM | POA: Diagnosis present

## 2021-02-01 DIAGNOSIS — Z7902 Long term (current) use of antithrombotics/antiplatelets: Secondary | ICD-10-CM

## 2021-02-01 DIAGNOSIS — Z9981 Dependence on supplemental oxygen: Secondary | ICD-10-CM

## 2021-02-01 DIAGNOSIS — Z794 Long term (current) use of insulin: Secondary | ICD-10-CM

## 2021-02-01 DIAGNOSIS — Z955 Presence of coronary angioplasty implant and graft: Secondary | ICD-10-CM

## 2021-02-01 DIAGNOSIS — I10 Essential (primary) hypertension: Secondary | ICD-10-CM | POA: Diagnosis present

## 2021-02-01 DIAGNOSIS — Z9079 Acquired absence of other genital organ(s): Secondary | ICD-10-CM

## 2021-02-01 DIAGNOSIS — I714 Abdominal aortic aneurysm, without rupture: Secondary | ICD-10-CM | POA: Diagnosis present

## 2021-02-01 DIAGNOSIS — E119 Type 2 diabetes mellitus without complications: Secondary | ICD-10-CM

## 2021-02-01 LAB — CBC WITH DIFFERENTIAL/PLATELET
Abs Immature Granulocytes: 0 10*3/uL (ref 0.00–0.07)
Basophils Absolute: 0.1 10*3/uL (ref 0.0–0.1)
Basophils Relative: 1 %
Eosinophils Absolute: 0 10*3/uL (ref 0.0–0.5)
Eosinophils Relative: 0 %
HCT: 35 % — ABNORMAL LOW (ref 39.0–52.0)
Hemoglobin: 10.3 g/dL — ABNORMAL LOW (ref 13.0–17.0)
Lymphocytes Relative: 2 %
Lymphs Abs: 0.2 10*3/uL — ABNORMAL LOW (ref 0.7–4.0)
MCH: 23.6 pg — ABNORMAL LOW (ref 26.0–34.0)
MCHC: 29.4 g/dL — ABNORMAL LOW (ref 30.0–36.0)
MCV: 80.1 fL (ref 80.0–100.0)
Monocytes Absolute: 0.1 10*3/uL (ref 0.1–1.0)
Monocytes Relative: 1 %
Neutro Abs: 10.4 10*3/uL — ABNORMAL HIGH (ref 1.7–7.7)
Neutrophils Relative %: 96 %
Platelets: 397 10*3/uL (ref 150–400)
RBC: 4.37 MIL/uL (ref 4.22–5.81)
RDW: 22.3 % — ABNORMAL HIGH (ref 11.5–15.5)
WBC: 10.8 10*3/uL — ABNORMAL HIGH (ref 4.0–10.5)
nRBC: 0.3 % — ABNORMAL HIGH (ref 0.0–0.2)
nRBC: 2 /100 WBC — ABNORMAL HIGH

## 2021-02-01 LAB — COMPREHENSIVE METABOLIC PANEL
ALT: 15 U/L (ref 0–44)
AST: 22 U/L (ref 15–41)
Albumin: 3 g/dL — ABNORMAL LOW (ref 3.5–5.0)
Alkaline Phosphatase: 75 U/L (ref 38–126)
Anion gap: 13 (ref 5–15)
BUN: 16 mg/dL (ref 8–23)
CO2: 17 mmol/L — ABNORMAL LOW (ref 22–32)
Calcium: 9.1 mg/dL (ref 8.9–10.3)
Chloride: 104 mmol/L (ref 98–111)
Creatinine, Ser: 1.18 mg/dL (ref 0.61–1.24)
GFR, Estimated: >60 mL/min (ref >60)
Glucose, Bld: 221 mg/dL — ABNORMAL HIGH (ref 70–99)
Potassium: 5.2 mmol/L — ABNORMAL HIGH (ref 3.5–5.1)
Sodium: 134 mmol/L — ABNORMAL LOW (ref 135–145)
Total Bilirubin: 0.7 mg/dL (ref 0.3–1.2)
Total Protein: 6.1 g/dL — ABNORMAL LOW (ref 6.5–8.1)

## 2021-02-01 LAB — I-STAT ARTERIAL BLOOD GAS, ED
Acid-base deficit: 6 mmol/L — ABNORMAL HIGH (ref 0.0–2.0)
Bicarbonate: 18.3 mmol/L — ABNORMAL LOW (ref 20.0–28.0)
Calcium, Ion: 1.23 mmol/L (ref 1.15–1.40)
HCT: 32 % — ABNORMAL LOW (ref 39.0–52.0)
Hemoglobin: 10.9 g/dL — ABNORMAL LOW (ref 13.0–17.0)
O2 Saturation: 91 %
Patient temperature: 97.9
Potassium: 4.9 mmol/L (ref 3.5–5.1)
Sodium: 133 mmol/L — ABNORMAL LOW (ref 135–145)
TCO2: 19 mmol/L — ABNORMAL LOW (ref 22–32)
pCO2 arterial: 30.3 mmHg — ABNORMAL LOW (ref 32.0–48.0)
pH, Arterial: 7.389 (ref 7.350–7.450)
pO2, Arterial: 59 mmHg — ABNORMAL LOW (ref 83.0–108.0)

## 2021-02-01 LAB — CBG MONITORING, ED: Glucose-Capillary: 271 mg/dL — ABNORMAL HIGH (ref 70–99)

## 2021-02-01 LAB — CBC
HCT: 31.4 % — ABNORMAL LOW (ref 39.0–52.0)
Hemoglobin: 9.6 g/dL — ABNORMAL LOW (ref 13.0–17.0)
MCH: 24.2 pg — ABNORMAL LOW (ref 26.0–34.0)
MCHC: 30.6 g/dL (ref 30.0–36.0)
MCV: 79.1 fL — ABNORMAL LOW (ref 80.0–100.0)
Platelets: 361 10*3/uL (ref 150–400)
RBC: 3.97 MIL/uL — ABNORMAL LOW (ref 4.22–5.81)
RDW: 22 % — ABNORMAL HIGH (ref 11.5–15.5)
WBC: 8.5 10*3/uL (ref 4.0–10.5)
nRBC: 0.2 % (ref 0.0–0.2)

## 2021-02-01 LAB — POC SARS CORONAVIRUS 2 AG -  ED: SARS Coronavirus 2 Ag: NEGATIVE

## 2021-02-01 LAB — BRAIN NATRIURETIC PEPTIDE: B Natriuretic Peptide: 136.2 pg/mL — ABNORMAL HIGH (ref 0.0–100.0)

## 2021-02-01 LAB — ETHANOL: Alcohol, Ethyl (B): <10 mg/dL (ref <10)

## 2021-02-01 LAB — D-DIMER, QUANTITATIVE: D-Dimer, Quant: 1.3 ug/mL-FEU — ABNORMAL HIGH (ref 0.00–0.50)

## 2021-02-01 MED ORDER — METHYLPREDNISOLONE SODIUM SUCC 125 MG IJ SOLR
125.0000 mg | INTRAMUSCULAR | Status: AC
  Administered 2021-02-02: 125 mg via INTRAVENOUS

## 2021-02-01 MED ORDER — BISOPROLOL FUMARATE 5 MG PO TABS
5.0000 mg | ORAL_TABLET | Freq: Every day | ORAL | Status: DC
Start: 2021-02-02 — End: 2021-02-06
  Administered 2021-02-03 – 2021-02-06 (×4): 5 mg via ORAL
  Filled 2021-02-01 (×5): qty 1

## 2021-02-01 MED ORDER — CLOPIDOGREL BISULFATE 75 MG PO TABS
75.0000 mg | ORAL_TABLET | Freq: Every day | ORAL | Status: DC
  Administered 2021-02-02 – 2021-02-06 (×5): 75 mg via ORAL
  Filled 2021-02-01 (×5): qty 1

## 2021-02-01 MED ORDER — ATORVASTATIN CALCIUM 40 MG PO TABS
40.0000 mg | ORAL_TABLET | Freq: Every day | ORAL | Status: DC
  Administered 2021-02-02 – 2021-02-05 (×4): 40 mg via ORAL
  Filled 2021-02-01 (×4): qty 1

## 2021-02-01 MED ORDER — ALBUTEROL SULFATE HFA 108 (90 BASE) MCG/ACT IN AERS
2.0000 | INHALATION_SPRAY | RESPIRATORY_TRACT | Status: DC | PRN
  Filled 2021-02-01: qty 6.7

## 2021-02-01 MED ORDER — GLUCERNA SHAKE PO LIQD
237.0000 mL | Freq: Two times a day (BID) | ORAL | Status: DC
  Administered 2021-02-03 – 2021-02-06 (×6): 237 mL via ORAL
  Filled 2021-02-01: qty 237

## 2021-02-01 MED ORDER — UMECLIDINIUM BROMIDE 62.5 MCG/INH IN AEPB
1.0000 | INHALATION_SPRAY | Freq: Every day | RESPIRATORY_TRACT | Status: DC
  Administered 2021-02-04 – 2021-02-06 (×3): 1 via RESPIRATORY_TRACT
  Filled 2021-02-01: qty 7

## 2021-02-01 MED ORDER — MAGNESIUM OXIDE 400 (241.3 MG) MG PO TABS
400.0000 mg | ORAL_TABLET | Freq: Every day | ORAL | Status: DC
  Administered 2021-02-02 – 2021-02-06 (×5): 400 mg via ORAL
  Filled 2021-02-01 (×5): qty 1

## 2021-02-01 MED ORDER — INSULIN ASPART PROT & ASPART (70-30 MIX) 100 UNIT/ML ~~LOC~~ SUSP
20.0000 [IU] | Freq: Two times a day (BID) | SUBCUTANEOUS | Status: DC
  Filled 2021-02-01: qty 10

## 2021-02-01 MED ORDER — FERROUS SULFATE 325 (65 FE) MG PO TABS
325.0000 mg | ORAL_TABLET | Freq: Two times a day (BID) | ORAL | Status: DC
  Filled 2021-02-01 (×4): qty 1

## 2021-02-01 MED ORDER — METFORMIN HCL 500 MG PO TABS: 1000.0000 mg | ORAL_TABLET | Freq: Two times a day (BID) | ORAL | Status: DC

## 2021-02-01 MED ORDER — ASPIRIN EC 81 MG PO TBEC: 81.0000 mg | DELAYED_RELEASE_TABLET | Freq: Every day | ORAL | Status: DC

## 2021-02-01 MED ORDER — ONDANSETRON HCL 4 MG/2ML IJ SOLN: 4.0000 mg | Freq: Four times a day (QID) | INTRAMUSCULAR | Status: DC | PRN

## 2021-02-01 MED ORDER — OXYBUTYNIN CHLORIDE ER 10 MG PO TB24
10.0000 mg | ORAL_TABLET | Freq: Every day | ORAL | Status: DC
  Administered 2021-02-03 – 2021-02-06 (×4): 10 mg via ORAL
  Filled 2021-02-01 (×5): qty 1

## 2021-02-01 MED ORDER — ACETAMINOPHEN 325 MG PO TABS: 650.0000 mg | ORAL_TABLET | Freq: Four times a day (QID) | ORAL | Status: DC | PRN

## 2021-02-01 MED ORDER — SODIUM CHLORIDE 0.9 % IV BOLUS
500.0000 mL | Freq: Once | INTRAVENOUS | Status: AC
  Administered 2021-02-01: 500 mL via INTRAVENOUS

## 2021-02-01 MED ORDER — FUROSEMIDE 10 MG/ML IJ SOLN
20.0000 mg | Freq: Once | INTRAMUSCULAR | Status: AC
  Administered 2021-02-01: 20 mg via INTRAVENOUS
  Filled 2021-02-01: qty 2

## 2021-02-01 MED ORDER — SODIUM CHLORIDE 0.9 % IV SOLN
250.0000 mL | INTRAVENOUS | Status: DC | PRN
Start: 2021-02-01 — End: 2021-02-06

## 2021-02-01 MED ORDER — DILTIAZEM HCL ER COATED BEADS 240 MG PO CP24
240.0000 mg | ORAL_CAPSULE | Freq: Every day | ORAL | Status: DC
  Administered 2021-02-02 – 2021-02-06 (×5): 240 mg via ORAL
  Filled 2021-02-01: qty 2
  Filled 2021-02-01 (×5): qty 1

## 2021-02-01 MED ORDER — SODIUM CHLORIDE 0.9% FLUSH
3.0000 mL | Freq: Two times a day (BID) | INTRAVENOUS | Status: DC
  Administered 2021-02-01 – 2021-02-06 (×10): 3 mL via INTRAVENOUS

## 2021-02-01 MED ORDER — SODIUM CHLORIDE 0.9% FLUSH: 3.0000 mL | INTRAVENOUS | Status: DC | PRN

## 2021-02-01 MED ORDER — SENNOSIDES-DOCUSATE SODIUM 8.6-50 MG PO TABS: 1.0000 | ORAL_TABLET | Freq: Every evening | ORAL | Status: DC | PRN

## 2021-02-01 MED ORDER — INSULIN ASPART 100 UNIT/ML ~~LOC~~ SOLN
0.0000 [IU] | Freq: Every day | SUBCUTANEOUS | Status: DC
  Administered 2021-02-01: 3 [IU] via SUBCUTANEOUS
  Administered 2021-02-02: 2 [IU] via SUBCUTANEOUS

## 2021-02-01 MED ORDER — ENSURE MAX PROTEIN PO LIQD
11.0000 [oz_av] | Freq: Every day | ORAL | Status: DC
  Administered 2021-02-02 – 2021-02-03 (×2): 11 [oz_av] via ORAL
  Filled 2021-02-01 (×6): qty 330

## 2021-02-01 MED ORDER — FLUTICASONE FUROATE-VILANTEROL 200-25 MCG/INH IN AEPB
1.0000 | INHALATION_SPRAY | Freq: Every day | RESPIRATORY_TRACT | Status: DC
  Administered 2021-02-04 – 2021-02-06 (×3): 1 via RESPIRATORY_TRACT
  Filled 2021-02-01: qty 28

## 2021-02-01 MED ORDER — INSULIN ASPART 100 UNIT/ML ~~LOC~~ SOLN
0.0000 [IU] | Freq: Three times a day (TID) | SUBCUTANEOUS | Status: DC
  Administered 2021-02-02 (×2): 5 [IU] via SUBCUTANEOUS
  Administered 2021-02-02: 3 [IU] via SUBCUTANEOUS

## 2021-02-01 MED ORDER — SODIUM CHLORIDE 0.9 % IV SOLN
100.0000 mg | Freq: Two times a day (BID) | INTRAVENOUS | Status: DC
  Administered 2021-02-01 – 2021-02-02 (×3): 100 mg via INTRAVENOUS
  Filled 2021-02-01 (×6): qty 100

## 2021-02-01 MED ORDER — ONDANSETRON HCL 4 MG PO TABS: 4.0000 mg | ORAL_TABLET | Freq: Four times a day (QID) | ORAL | Status: DC | PRN

## 2021-02-01 MED ORDER — ENOXAPARIN SODIUM 40 MG/0.4ML ~~LOC~~ SOLN
40.0000 mg | SUBCUTANEOUS | Status: DC
  Administered 2021-02-01 – 2021-02-05 (×5): 40 mg via SUBCUTANEOUS
  Filled 2021-02-01 (×5): qty 0.4

## 2021-02-01 MED ORDER — ACETAMINOPHEN 650 MG RE SUPP: 650.0000 mg | Freq: Four times a day (QID) | RECTAL | Status: DC | PRN

## 2021-02-01 MED ORDER — METHYLPREDNISOLONE SODIUM SUCC 125 MG IJ SOLR
125.0000 mg | Freq: Three times a day (TID) | INTRAMUSCULAR | Status: DC
  Filled 2021-02-01: qty 2

## 2021-02-01 MED ORDER — ALBUTEROL SULFATE HFA 108 (90 BASE) MCG/ACT IN AERS: 2.0000 | INHALATION_SPRAY | RESPIRATORY_TRACT | Status: DC | PRN

## 2021-02-01 NOTE — ED Provider Notes (Signed)
[facility] EMERGENCY DEPARTMENT Provider Note   CSN: [id number] Arrival date & time: 02/01/21  1724     History No chief complaint on file.   Brandon [name] is a 75 y.o. male.  HPI  Patient with a history of COPD as well as multiple other medical problems presents with dyspnea. Patient was hospitalized 2 weeks ago, notes that on discharge he was well, but has recently developed worsening dyspnea, fatigue to the point where he can no longer walk across the room without severe weakness. No pain, no fever, no nausea, no vomiting. He is on oxygen 24/7 nasal cannula, 4.5 L, this is unchanged. Patient notes that he feels somewhat better after arrival here.  Per EMS he received Solu-Medrol, 125 mg in route, and was on a nonrebreather mask in transport.  Patient was hypoxic on his typical home oxygen 78% prior to EMS transport and provision of Atrovent as well as albuterol, magnesium and Solu-Medrol as above.      Past Medical History:  Diagnosis Date  . Abdominal aortic aneurysm without rupture ([place])   . CAD (coronary artery disease)    severe on chest CT   . Chronic alcoholism ([place])   . Chronic idiopathic constipation   . Chronic pancreatitis ([place])   . Chronic respiratory failure with hypoxia ([place])   . Essential hypertension   . GERD (gastroesophageal reflux disease)   . Hyperaldosteronism ([place])   . Malignant neoplasm of prostate ([place])   . Nicotine dependence   . Paroxysmal atrial tachycardia ([place])   . Peripheral vascular disease ([place])   . Serrated polyp of colon   . Type 2 diabetes mellitus [facility])     Patient Active Problem List   Diagnosis Date Noted  . Pressure injury of skin 01/09/2021  . Malnutrition of moderate degree 01/09/2021  . Elevated troponin 01/07/2021  . COPD with acute exacerbation ([place]) 01/07/2021  . History of CVA (cerebrovascular accident) 01/07/2021  . COPD exacerbation ([place]) 01/07/2021  . Hypomagnesemia 01/07/2021  . Essential  hypertension   . Type 2 diabetes mellitus ([place])   . CAD (coronary artery disease)   . GERD (gastroesophageal reflux disease)   . Acute on chronic respiratory failure with hypoxemia ([place])   . COPD (chronic obstructive pulmonary disease) ([place])   . Tobacco abuse   . Hyponatremia   . Hyperkalemia   . Ischemic stroke [facility])     Past Surgical History:  Procedure Laterality Date  . CORONARY ATHERECTOMY N/A 01/11/2021   Procedure: CORONARY ATHERECTOMY;  Surgeon: Sherren Mocha, MD;  Location: [facility];  Service: Cardiovascular;  Laterality: N/A;  . CORONARY ATHERECTOMY N/A 01/13/2021   Procedure: CORONARY ATHERECTOMY;  Surgeon: Martinique, Peter M, MD;  Location: [facility];  Service: Cardiovascular;  Laterality: N/A;  . CORONARY STENT INTERVENTION N/A 01/13/2021   Procedure: CORONARY STENT INTERVENTION;  Surgeon: Martinique, Peter M, MD;  Location: [facility];  Service: Cardiovascular;  Laterality: N/A;  . INTRAVASCULAR ULTRASOUND/IVUS N/A 01/13/2021   Procedure: Intravascular Ultrasound/IVUS;  Surgeon: Martinique, Peter M, MD;  Location: [facility];  Service: Cardiovascular;  Laterality: N/A;  . PROSTATECTOMY  12/2012  . RIGHT/LEFT HEART CATH AND CORONARY ANGIOGRAPHY N/A 01/11/2021   Procedure: RIGHT/LEFT HEART CATH AND CORONARY ANGIOGRAPHY;  Surgeon: Sherren Mocha, MD;  Location: [facility];  Service: Cardiovascular;  Laterality: N/A;       Family History  Problem Relation Age of Onset  . Hypertension Mother   .  Hypertension Father     Social History   Tobacco Use  . Smoking status: Former Smoker    Packs/day: 0.50    Years: 60.00    Pack years: 30.00    Types: Cigarettes    Quit date: 10/08/2020    Years since quitting: 0.3  . Smokeless tobacco: Never Used  Substance Use Topics  . Alcohol use: Not Currently  . Drug use: Never    Home Medications Prior to Admission medications   Medication Sig Start Date End Date Taking? Authorizing Provider   acetaminophen (TYLENOL) 325 MG tablet Take 2 tablets (650 mg total) by mouth every 6 (six) hours as needed for mild pain (or Fever >/= 101). 01/14/21   Marguerita Merles Latif, DO  albuterol (VENTOLIN HFA) 108 (90 Base) MCG/ACT inhaler Inhale 2 puffs into the lungs every 6 (six) hours as needed for wheezing or shortness of breath.  11/24/19   [provider]  aspirin 81 MG EC tablet Take 1 tablet (81 mg total) by mouth daily. 10/21/20   Azucena Fallen, MD  atorvastatin (LIPITOR) 40 MG tablet Take 40 mg by mouth at bedtime.  06/11/19   [provider]  bisoprolol (ZEBETA) 5 MG tablet Take 1 tablet (5 mg total) by mouth daily. 01/14/21   Marguerita Merles Latif, DO  clopidogrel (PLAVIX) 75 MG tablet Take 1 tablet (75 mg total) by mouth daily. 10/22/20   Azucena Fallen, MD  diltiazem (CARDIZEM CD) 240 MG 24 hr capsule Take 1 capsule (240 mg total) by mouth daily. 01/14/21   Marguerita Merles Latif, DO  Ensure Max Protein (ENSURE MAX PROTEIN) LIQD Take 330 mLs (11 oz total) by mouth at bedtime. 01/14/21   Marguerita Merles Latif, DO  feeding supplement, GLUCERNA SHAKE, (GLUCERNA SHAKE) LIQD Take 237 mLs by mouth 2 (two) times daily between meals. 01/14/21   Marguerita Merles Latif, DO  ferrous sulfate 325 (65 FE) MG tablet Take 1 tablet (325 mg total) by mouth 2 (two) times daily with a meal. 01/14/21   Sheikh, Omair Latif, DO  Fluticasone-Salmeterol (ADVAIR) 250-50 MCG/DOSE AEPB Inhale 1 puff into the lungs 2 (two) times daily.    [provider]  insulin aspart protamine - aspart (NOVOLOG 70/30 MIX) (70-30) 100 UNIT/ML FlexPen Inject 0.2 mLs (20 Units total) into the skin 2 (two) times daily with a meal. 01/14/21   Sheikh, Omair Latif, DO  magnesium oxide (MAG-OX) 400 (241.3 Mg) MG tablet Take 1 tablet (400 mg total) by mouth daily. 01/14/21   Marguerita Merles Latif, DO  metFORMIN (GLUCOPHAGE) 1000 MG tablet Take 1,000 mg by mouth 2 (two) times daily with a meal.     [provider]   Multiple Vitamin (MULTIVITAMIN WITH MINERALS) TABS tablet Take 1 tablet by mouth daily. 01/14/21   Sheikh, Omair Latif, DO  Omega-3 1000 MG CAPS Take 1,000 mg by mouth in the morning and at bedtime.     [provider]  oxybutynin (DITROPAN-XL) 10 MG 24 hr tablet Take 10 mg by mouth daily.    [provider]  pantoprazole (PROTONIX) 40 MG tablet Take 1 tablet (40 mg total) by mouth daily. 01/14/21   Sheikh, Omair Latif, DO  senna-docusate (SENOKOT-S) 8.6-50 MG tablet Take 1 tablet by mouth at bedtime as needed for mild constipation. 01/14/21   Marguerita Merles Latif, DO  Tiotropium Bromide Monohydrate 2.5 MCG/ACT AERS Inhale 2 puffs into the lungs daily.    [provider]  vitamin B-12 1000 MCG tablet  Take 1 tablet (1,000 mcg total) by mouth daily. 01/14/21   Raiford Noble Latif, DO    Allergies    Hctz [hydrochlorothiazide]  Review of Systems   Review of Systems  Constitutional:       Per HPI, otherwise negative  HENT:       Per HPI, otherwise negative  Respiratory:       Per HPI, otherwise negative  Cardiovascular:       Per HPI, otherwise negative  Gastrointestinal: Negative for vomiting.  Endocrine:       Negative aside from HPI  Genitourinary:       Neg aside from HPI   Musculoskeletal:       Per HPI, otherwise negative  Skin: Negative.   Neurological: Positive for weakness. Negative for syncope.    Physical Exam Updated Vital Signs BP 128/69   Pulse 98   Temp 97.9 F (36.6 C)   Resp (!) 23   SpO2 100%   Physical Exam Vitals and nursing note reviewed.  Constitutional:      General: He is not in acute distress.    Appearance: He is well-developed.  HENT:     Head: Normocephalic and atraumatic.  Eyes:     Extraocular Movements: EOM normal.     Conjunctiva/sclera: Conjunctivae normal.  Cardiovascular:     Rate and Rhythm: Regular rhythm. Tachycardia present.  Pulmonary:     Effort: Tachypnea and accessory muscle usage present.      Breath sounds: Decreased air movement present.  Abdominal:     General: There is no distension.  Musculoskeletal:        General: No edema.  Skin:    General: Skin is warm and dry.  Neurological:     Mental Status: He is alert and oriented to person, place, and time.  Psychiatric:        Mood and Affect: Mood and affect normal.      ED Results / Procedures / Treatments   Labs (all labs ordered are listed, but only abnormal results are displayed) Labs Reviewed  COMPREHENSIVE METABOLIC PANEL - Abnormal; Notable for the following components:      Result Value   Sodium 134 (*)    Potassium 5.2 (*)    CO2 17 (*)    Glucose, Bld 221 (*)    Total Protein 6.1 (*)    Albumin 3.0 (*)    All other components within normal limits  CBC WITH DIFFERENTIAL/PLATELET - Abnormal; Notable for the following components:   WBC 10.8 (*)    Hemoglobin 10.3 (*)    HCT 35.0 (*)    MCH 23.6 (*)    MCHC 29.4 (*)    RDW 22.3 (*)    nRBC 0.3 (*)    Neutro Abs 10.4 (*)    Lymphs Abs 0.2 (*)    nRBC 2 (*)    All other components within normal limits  BRAIN NATRIURETIC PEPTIDE - Abnormal; Notable for the following components:   B Natriuretic Peptide 136.2 (*)    All other components within normal limits  ETHANOL  POC SARS CORONAVIRUS 2 AG -  ED    EKG EKG Interpretation  Date/Time:  Wednesday February 01 2021 17:31:08 EST Ventricular Rate:  96 PR Interval:    QRS Duration: 117 QT Interval:  365 QTC Calculation: 462 R Axis:   90 Text Interpretation: Sinus rhythm Nonspecific intraventricular conduction delay ST-t wave abnormality Artifact Baseline wander Abnormal ECG Confirmed by Carmin Muskrat [phone]) on 02/01/2021  5:51:17 PM   Radiology DG Chest 1 View  Result Date: 02/01/2021 CLINICAL DATA:  Shortness of breath. EXAM: CHEST  1 VIEW COMPARISON:  January 07, 2021 FINDINGS: The heart size is enlarged. Aortic calcifications are noted. There are worsening diffuse bilateral airspace opacities  with prominent interstitial lung markings. There are bilateral pleural effusions which have increased in size since the patient's prior chest x-ray. There is no pneumothorax. No definite acute osseous abnormality. IMPRESSION: 1. Cardiomegaly with findings suggestive of congestive heart failure with bilateral growing pleural effusions. 2. Worsening diffuse airspace opacities may represent progressive pulmonary edema or an underlying atypical infectious process. 3. Emphysema. Electronically Signed   By: Constance Holster M.D.   On: 02/01/2021 18:16    Procedures Procedures   Medications Ordered in ED Medications  methylPREDNISolone sodium succinate (SOLU-MEDROL) 125 mg/2 mL injection 125 mg (125 mg Intravenous Not Given 02/01/21 1820)  albuterol (VENTOLIN HFA) 108 (90 Base) MCG/ACT inhaler 2 puff (has no administration in time range)  furosemide (LASIX) injection 20 mg (has no administration in time range)  sodium chloride 0.9 % bolus 500 mL (0 mLs Intravenous Stopped 02/01/21 2041)    ED Course  I have reviewed the triage vital signs and the nursing notes.  Pertinent labs & imaging results that were available during my care of the patient were reviewed by me and considered in my medical decision making (see chart for details).     Pulse oximetry 98%, 5 L, nasal cannula, abnormal, but baseline Cardiac monitor sinus rhythm, 90s, normal though with episodes of sinus tach, rate greater than 100 abnormal  After brief improvement following arrival, the patient required return to BiPAP.  9:29 PM Patient speaking clearly, he is still on BiPAP, he states that he is feeling better. Now remaining labs are available, consistent with concern for new pleural effusions, possible mild heart failure likely contributing to his respiratory difficulty with baseline COPD and home oxygen use. No early evidence for pneumonia, Covid test negative.  Patient has received resuscitation with steroids, multiple  bronchodilators, will receive IV Lasix, will require admission to the stepdown unit given his BiPAP usage for COPD/CHF heart with respiratory distress Final Clinical Impression(s) / ED Diagnoses Final diagnoses:  Respiratory distress   MDM Number of Diagnoses or Management Options Respiratory distress: established, worsening   Amount and/or Complexity of Data Reviewed Clinical lab tests: reviewed Tests in the radiology section of CPT: reviewed Tests in the medicine section of CPT: reviewed Decide to obtain previous medical records or to obtain history from someone other than the patient: yes Obtain history from someone other than the patient: yes Review and summarize past medical records: yes Discuss the patient with other providers: yes Independent visualization of images, tracings, or specimens: yes  Risk of Complications, Morbidity, and/or Mortality Presenting problems: high Diagnostic procedures: high Management options: high  Critical Care Total time providing critical care: 30-74 minutes (35)  Patient Progress Patient progress: stable    Carmin Muskrat, MD 02/01/21 2132

## 2021-02-01 NOTE — H&P (Signed)
History and Physical    Brandon [name] [id number] DOB: [birth year] DOA: 02/01/2021  PCP: Pcp, No   Patient coming from:   Home  Chief Complaint:  SOB  HPI: Brandon [name] is a 75 y.o. male with medical history significant for CAD, recent MI with stent placement in Jan. 2022, COPD, DM, HTN, PVD who presents by EMS with complaint of SOB worsening over the past few days. Uses oxygen by n/c at home daily. Has needed increased oxygen past two days at home. Patient was hypoxic on his typical home oxygen at 78% prior to EMS transport per report. He was given Atrovent, albuterol, magnesium and Solu-Medrol by EMS. He was placed on Bipap in ER.  The emergency room he was found to have tachypnea and increased work of breathing and was placed on BiPAP which she is tolerating well.  Chest x-ray shows some pulmonary edema versus atypical diffuse infiltrates.  He was given Lasix in the emergency room.  BNP was mildly elevated at 136.  Not obtained in the emergency room.  D-dimer and procalcitonin were also obtained in the emergency room but have not been ordered.  Over the last few days he has had worsening shortness of breath that is exacerbated by activity and walking.  He states he can only walk 2-3 steps due to his severe shortness of breath over the last 24 hours at home which is not normal for him.  States he has not had any fever or chills.  He denies any abdominal pain nausea vomiting or diarrhea.  He has not had any change in diet and he has no known sick contacts at home.  He lives by himself but does have home health is right now.  Patient had echocardiogram last month which is reviewed.  Patient had mildly decreased diastolic function but normal EF.   ED Course: Patient placed on BiPAP in the emergency room which is tolerating.  ABG was obtained previously but has now been ordered and is pending.  Hospital service been asked to admit for further management.  Review of Systems:  General: Denies  fever,  chills, weight loss, night sweats.  Denies dizziness.  Denies change in appetite HENT: Denies head trauma, headache, denies change in hearing, tinnitus.  Denies nasal congestion or bleeding.  Denies sore throat, sores in mouth.  Denies difficulty swallowing Eyes: Denies blurry vision, pain in eye, drainage.  Denies discoloration of eyes. Neck: Denies pain.  Denies swelling.  Denies pain with movement. Cardiovascular: Denies chest pain, palpitations. Has chronic edema.  Denies orthopnea Respiratory: Reports shortness of breath with dry cough.  Denies wheezing.  Denies sputum production Gastrointestinal: Denies abdominal pain, swelling.  Denies nausea, vomiting, diarrhea.  Denies melena.  Denies hematemesis. Musculoskeletal: Denies limitation of movement.  Denies deformity or swelling.  Denies pain.  Genitourinary: Denies pelvic pain.  Denies urinary frequency or hesitancy.  Denies dysuria.  Skin: Denies rash.  Denies petechiae, purpura, ecchymosis. Neurological: Denies headache.  Denies syncope.  Denies seizure activity.  Denies paresthesia.  Denies slurred speech, drooping face.  Denies visual change. Psychiatric: Denies depression, anxiety.  Denies hallucinations.  Past Medical History:  Diagnosis Date  . Abdominal aortic aneurysm without rupture ([place])   . CAD (coronary artery disease)    severe on chest CT   . Chronic alcoholism ([place])   . Chronic idiopathic constipation   . Chronic pancreatitis ([place])   . Chronic respiratory failure with hypoxia ([place])   . Essential hypertension   . GERD (gastroesophageal  reflux disease)   . Hyperaldosteronism ([place])   . Malignant neoplasm of prostate ([place])   . Nicotine dependence   . Paroxysmal atrial tachycardia ([place])   . Peripheral vascular disease ([place])   . Serrated polyp of colon   . Type 2 diabetes mellitus ([place])     Past Surgical History:  Procedure Laterality Date  . CORONARY ATHERECTOMY N/A 01/11/2021   Procedure: CORONARY ATHERECTOMY;   Surgeon: Sherren Mocha, MD;  Location: [facility];  Service: Cardiovascular;  Laterality: N/A;  . CORONARY ATHERECTOMY N/A 01/13/2021   Procedure: CORONARY ATHERECTOMY;  Surgeon: Martinique, Peter M, MD;  Location: [facility];  Service: Cardiovascular;  Laterality: N/A;  . CORONARY STENT INTERVENTION N/A 01/13/2021   Procedure: CORONARY STENT INTERVENTION;  Surgeon: Martinique, Peter M, MD;  Location: [facility];  Service: Cardiovascular;  Laterality: N/A;  . INTRAVASCULAR ULTRASOUND/IVUS N/A 01/13/2021   Procedure: Intravascular Ultrasound/IVUS;  Surgeon: Martinique, Peter M, MD;  Location: [facility];  Service: Cardiovascular;  Laterality: N/A;  . PROSTATECTOMY  12/2012  . RIGHT/LEFT HEART CATH AND CORONARY ANGIOGRAPHY N/A 01/11/2021   Procedure: RIGHT/LEFT HEART CATH AND CORONARY ANGIOGRAPHY;  Surgeon: Sherren Mocha, MD;  Location: [facility];  Service: Cardiovascular;  Laterality: N/A;    Social History  reports that he quit smoking about 3 months ago. His smoking use included cigarettes. He has a 30.00 pack-year smoking history. He has never used smokeless tobacco. He reports previous alcohol use. He reports that he does not use drugs.  Allergies  Allergen Reactions  . Hctz [Hydrochlorothiazide]     Family History  Problem Relation Age of Onset  . Hypertension Mother   . Hypertension Father      Prior to Admission medications   Medication Sig Start Date End Date Taking? Authorizing Provider  acetaminophen (TYLENOL) 325 MG tablet Take 2 tablets (650 mg total) by mouth every 6 (six) hours as needed for mild pain (or Fever >/= 101). 01/14/21   Raiford Noble Latif, DO  albuterol (VENTOLIN HFA) 108 (90 Base) MCG/ACT inhaler Inhale 2 puffs into the lungs every 6 (six) hours as needed for wheezing or shortness of breath.  11/24/19   [provider]  aspirin 81 MG EC tablet Take 1 tablet (81 mg total) by mouth daily. 10/21/20   Little Ishikawa, MD   atorvastatin (LIPITOR) 40 MG tablet Take 40 mg by mouth at bedtime.  06/11/19   [provider]  bisoprolol (ZEBETA) 5 MG tablet Take 1 tablet (5 mg total) by mouth daily. 01/14/21   Raiford Noble Latif, DO  clopidogrel (PLAVIX) 75 MG tablet Take 1 tablet (75 mg total) by mouth daily. 10/22/20   Little Ishikawa, MD  diltiazem (CARDIZEM CD) 240 MG 24 hr capsule Take 1 capsule (240 mg total) by mouth daily. 01/14/21   Raiford Noble Latif, DO  Ensure Max Protein (ENSURE MAX PROTEIN) LIQD Take 330 mLs (11 oz total) by mouth at bedtime. 01/14/21   Raiford Noble Latif, DO  feeding supplement, GLUCERNA SHAKE, (GLUCERNA SHAKE) LIQD Take 237 mLs by mouth 2 (two) times daily between meals. 01/14/21   Raiford Noble Latif, DO  ferrous sulfate 325 (65 FE) MG tablet Take 1 tablet (325 mg total) by mouth 2 (two) times daily with a meal. 01/14/21   Sheikh, Omair Latif, DO  Fluticasone-Salmeterol (ADVAIR) 250-50 MCG/DOSE AEPB Inhale 1 puff into the lungs 2 (two) times daily.    [provider]  insulin  aspart protamine - aspart (NOVOLOG 70/30 MIX) (70-30) 100 UNIT/ML FlexPen Inject 0.2 mLs (20 Units total) into the skin 2 (two) times daily with a meal. 01/14/21   Sheikh, Omair Latif, DO  magnesium oxide (MAG-OX) 400 (241.3 Mg) MG tablet Take 1 tablet (400 mg total) by mouth daily. 01/14/21   Raiford Noble Latif, DO  metFORMIN (GLUCOPHAGE) 1000 MG tablet Take 1,000 mg by mouth 2 (two) times daily with a meal.     [provider]  Multiple Vitamin (MULTIVITAMIN WITH MINERALS) TABS tablet Take 1 tablet by mouth daily. 01/14/21   Sheikh, Omair Latif, DO  Omega-3 1000 MG CAPS Take 1,000 mg by mouth in the morning and at bedtime.     [provider]  oxybutynin (DITROPAN-XL) 10 MG 24 hr tablet Take 10 mg by mouth daily.    [provider]  pantoprazole (PROTONIX) 40 MG tablet Take 1 tablet (40 mg total) by mouth daily. 01/14/21   Sheikh, Omair Latif, DO  senna-docusate (SENOKOT-S)  8.6-50 MG tablet Take 1 tablet by mouth at bedtime as needed for mild constipation. 01/14/21   Raiford Noble Latif, DO  Tiotropium Bromide Monohydrate 2.5 MCG/ACT AERS Inhale 2 puffs into the lungs daily.    [provider]  vitamin B-12 1000 MCG tablet Take 1 tablet (1,000 mcg total) by mouth daily. 01/14/21   Kerney Elbe, DO    Physical Exam: Vitals:   02/01/21 2000 02/01/21 2015 02/01/21 2030 02/01/21 2045  BP: 134/69 129/66 136/65 129/69  Pulse: (!) 106 (!) 107 (!) 106 (!) 106  Resp: 19 18 (!) 24 (!) 23  Temp:      SpO2: 93% 96% 95% 94%    Constitutional: NAD, calm, comfortable Vitals:   02/01/21 2000 02/01/21 2015 02/01/21 2030 02/01/21 2045  BP: 134/69 129/66 136/65 129/69  Pulse: (!) 106 (!) 107 (!) 106 (!) 106  Resp: 19 18 (!) 24 (!) 23  Temp:      SpO2: 93% 96% 95% 94%   General: WDWN, Alert and oriented x3.  Eyes: EOMI, PERRL, conjunctivae normal.  Sclera nonicteric HENT:  [place]/AT. Nares patent. No epistaxis. Ears without external lesions. Bipap mask in good position. No lesions of lips noted. Neck: Soft, normal range of motion, supple, no masses, no thyromegaly. Trachea midline Respiratory: Diminished breath sounds with diffuse rales. Tachypnea. no wheezing. Normal respiratory effort on bipap. No accessory muscle use.  Cardiovascular: Regular rate and rhythm, no murmurs / rubs / gallops. Has mild lower extremity edema. 1+ pedal pulses. Abdomen: Soft, no tenderness, nondistended, no rebound or guarding.  No masses palpated. Bowel sounds normoactive Musculoskeletal: FROM. no clubbing / cyanosis. Normal muscle tone.  Skin: Warm, dry, intact no rashes, lesions, ulcers. No induration. Dry cracking skin of legs bilaterally.  Neurologic: CN 2-12 grossly intact.  Normal speech.  Sensation intact, patella DTR +1 bilaterally. Strength 5/5 in all extremities.   Psychiatric: Normal judgment and insight.  Normal mood.    Labs on Admission: I have personally reviewed  following labs and imaging studies  CBC: Recent Labs  Lab 02/01/21 1846  WBC 10.8*  NEUTROABS 10.4*  HGB 10.3*  HCT 35.0*  MCV 80.1  PLT 638    Basic Metabolic Panel: Recent Labs  Lab 02/01/21 1846  NA 134*  K 5.2*  CL 104  CO2 17*  GLUCOSE 221*  BUN 16  CREATININE 1.18  CALCIUM 9.1    GFR: CrCl cannot be calculated (Unknown ideal weight.).  Liver Function Tests:  Recent Labs  Lab 02/01/21 1846  AST 22  ALT 15  ALKPHOS 75  BILITOT 0.7  PROT 6.1*  ALBUMIN 3.0*    Urine analysis:    Component Value Date/Time   COLORURINE YELLOW 01/07/2021 2010   APPEARANCEUR CLEAR 01/07/2021 2010   LABSPEC 1.006 01/07/2021 2010   PHURINE 5.0 01/07/2021 2010   GLUCOSEU NEGATIVE 01/07/2021 2010   HGBUR NEGATIVE 01/07/2021 2010   [place] NEGATIVE 01/07/2021 2010   [place] NEGATIVE 01/07/2021 2010   PROTEINUR NEGATIVE 01/07/2021 2010   NITRITE NEGATIVE 01/07/2021 2010   LEUKOCYTESUR NEGATIVE 01/07/2021 2010    Radiological Exams on Admission: DG Chest 1 View  Result Date: 02/01/2021 CLINICAL DATA:  Shortness of breath. EXAM: CHEST  1 VIEW COMPARISON:  January 07, 2021 FINDINGS: The heart size is enlarged. Aortic calcifications are noted. There are worsening diffuse bilateral airspace opacities with prominent interstitial lung markings. There are bilateral pleural effusions which have increased in size since the patient's prior chest x-ray. There is no pneumothorax. No definite acute osseous abnormality. IMPRESSION: 1. Cardiomegaly with findings suggestive of congestive heart failure with bilateral growing pleural effusions. 2. Worsening diffuse airspace opacities may represent progressive pulmonary edema or an underlying atypical infectious process. 3. Emphysema. Electronically Signed   By: Constance Holster M.D.   On: 02/01/2021 18:16    EKG: Independently reviewed.  EKG is reviewed and shows normal sinus rhythm with nonspecific ST changes.  No acute ST elevation or  depression.  QTc 462  Assessment/Plan Active Problems:   Acute on chronic respiratory failure with hypoxemia (HCC)   COPD with acute exacerbation (HCC)   (HFpEF) heart failure with preserved ejection fraction (HCC)   Essential hypertension   Type 2 diabetes mellitus (HCC)   CAD (coronary artery disease)   COPD exacerbation (HCC)     DVT prophylaxis: Lovenox for DVT prophylaxis.  Code Status:   Full code  Family Communication:  Diagnosis and plan discussed with patient.  Patient verbalized understanding and agrees with plan Disposition Plan:   Patient is from:  Home  Anticipated DC to:  Home  Anticipated DC date:  Anticipate more than 2 midnight stay in the hospital to treat acute medical      condition  Anticipated DC barriers: No barriers to discharge identified at this time  Admission status:  Inpatient  Yevonne Aline Byrne Capek MD [facility]  How to contact the [facility] Attending or Consulting provider [place] or covering provider during after hours [place], for this patient?   1. Check the care team in [facility] and look for a) attending/consulting [facility] provider listed and b) the [facility] team listed 2. Log into [email or web address] and use [place]'s universal password to access. If you do not have the password, please contact the hospital operator. 3. Locate the [facility] provider you are looking for under [facility] and page to a number that you can be directly reached. 4. If you still have difficulty reaching the provider, please page the [facility] (Director on Call) for the Hospitalists listed on amion for assistance.  02/01/2021, 10:20 PM

## 2021-02-01 NOTE — ED Notes (Signed)
Pt 100% on NRB, placed pt on 5L [place] with saturations remaining 100%. Pt in NAD.

## 2021-02-01 NOTE — ED Notes (Signed)
Pt appears much more labored in breathing, reports SOB for the past hour; 590ml NS completed. Rhonchi noted; RT contacted for bipap. MD made aware

## 2021-02-01 NOTE — ED Triage Notes (Signed)
Pt bib ems called out for increased respiratory difficulty for the last few days. Pt was 78% 5L [place] on scene, decreased lung sounds with wheezing in upper lobes. Hx copd. Pt given 125mg  solumedrol, 0.5 atrovent, 10mg  albuterol and 2g magnesium en route. Recent cardiac stent placed approx 3 weeks ago. Pt also endorses generalized weakness for the last 3 days. 12 lead unremarkable. 120/65, RR 28, 100% 10L NRB.

## 2021-02-02 DIAGNOSIS — R0603 Acute respiratory distress: Secondary | ICD-10-CM

## 2021-02-02 DIAGNOSIS — I5031 Acute diastolic (congestive) heart failure: Secondary | ICD-10-CM

## 2021-02-02 LAB — CREATININE, SERUM
Creatinine, Ser: 1.03 mg/dL (ref 0.61–1.24)
GFR, Estimated: >60 mL/min (ref >60)

## 2021-02-02 LAB — CBC
HCT: 32.1 % — ABNORMAL LOW (ref 39.0–52.0)
Hemoglobin: 10 g/dL — ABNORMAL LOW (ref 13.0–17.0)
MCH: 24.4 pg — ABNORMAL LOW (ref 26.0–34.0)
MCHC: 31.2 g/dL (ref 30.0–36.0)
MCV: 78.5 fL — ABNORMAL LOW (ref 80.0–100.0)
Platelets: 357 10*3/uL (ref 150–400)
RBC: 4.09 MIL/uL — ABNORMAL LOW (ref 4.22–5.81)
RDW: 22.2 % — ABNORMAL HIGH (ref 11.5–15.5)
WBC: 6.6 10*3/uL (ref 4.0–10.5)
nRBC: 0.3 % — ABNORMAL HIGH (ref 0.0–0.2)

## 2021-02-02 LAB — I-STAT ARTERIAL BLOOD GAS, ED
Acid-base deficit: 2 mmol/L (ref 0.0–2.0)
Bicarbonate: 21.2 mmol/L (ref 20.0–28.0)
Calcium, Ion: 1.24 mmol/L (ref 1.15–1.40)
HCT: 31 % — ABNORMAL LOW (ref 39.0–52.0)
Hemoglobin: 10.5 g/dL — ABNORMAL LOW (ref 13.0–17.0)
O2 Saturation: 91 %
Patient temperature: 97.9
Potassium: 4.3 mmol/L (ref 3.5–5.1)
Sodium: 134 mmol/L — ABNORMAL LOW (ref 135–145)
TCO2: 22 mmol/L (ref 22–32)
pCO2 arterial: 30.5 mmHg — ABNORMAL LOW (ref 32.0–48.0)
pH, Arterial: 7.447 (ref 7.350–7.450)
pO2, Arterial: 55 mmHg — ABNORMAL LOW (ref 83.0–108.0)

## 2021-02-02 LAB — MRSA PCR SCREENING: MRSA by PCR: NEGATIVE

## 2021-02-02 LAB — GLUCOSE, CAPILLARY
Glucose-Capillary: 162 mg/dL — ABNORMAL HIGH (ref 70–99)
Glucose-Capillary: 209 mg/dL — ABNORMAL HIGH (ref 70–99)

## 2021-02-02 LAB — BASIC METABOLIC PANEL
Anion gap: 13 (ref 5–15)
BUN: 14 mg/dL (ref 8–23)
CO2: 19 mmol/L — ABNORMAL LOW (ref 22–32)
Calcium: 8.6 mg/dL — ABNORMAL LOW (ref 8.9–10.3)
Chloride: 101 mmol/L (ref 98–111)
Creatinine, Ser: 1.09 mg/dL (ref 0.61–1.24)
GFR, Estimated: >60 mL/min (ref >60)
Glucose, Bld: 270 mg/dL — ABNORMAL HIGH (ref 70–99)
Potassium: 4.5 mmol/L (ref 3.5–5.1)
Sodium: 133 mmol/L — ABNORMAL LOW (ref 135–145)

## 2021-02-02 LAB — CBG MONITORING, ED
Glucose-Capillary: 250 mg/dL — ABNORMAL HIGH (ref 70–99)
Glucose-Capillary: 245 mg/dL — ABNORMAL HIGH (ref 70–99)
Glucose-Capillary: 210 mg/dL — ABNORMAL HIGH (ref 70–99)

## 2021-02-02 LAB — PROCALCITONIN: Procalcitonin: <0.1 ng/mL

## 2021-02-02 LAB — TROPONIN I (HIGH SENSITIVITY)
Troponin I (High Sensitivity): 251 ng/L (ref <18)
Troponin I (High Sensitivity): 272 ng/L (ref <18)

## 2021-02-02 MED ORDER — FUROSEMIDE 10 MG/ML IJ SOLN
40.0000 mg | Freq: Every day | INTRAMUSCULAR | Status: DC
  Administered 2021-02-02: 40 mg via INTRAVENOUS
  Filled 2021-02-02: qty 4

## 2021-02-02 MED ORDER — ASPIRIN EC 325 MG PO TBEC
325.0000 mg | DELAYED_RELEASE_TABLET | Freq: Every day | ORAL | Status: DC
Start: 2021-02-02 — End: 2021-02-06
  Administered 2021-02-02 – 2021-02-06 (×5): 325 mg via ORAL
  Filled 2021-02-02 (×5): qty 1

## 2021-02-02 MED ORDER — FUROSEMIDE 10 MG/ML IJ SOLN
40.0000 mg | Freq: Two times a day (BID) | INTRAMUSCULAR | Status: DC
  Administered 2021-02-02 – 2021-02-03 (×2): 40 mg via INTRAVENOUS
  Filled 2021-02-02 (×2): qty 4

## 2021-02-02 MED ORDER — INSULIN ASPART PROT & ASPART (70-30 MIX) 100 UNIT/ML ~~LOC~~ SUSP
10.0000 [IU] | Freq: Two times a day (BID) | SUBCUTANEOUS | Status: DC
  Administered 2021-02-02 – 2021-02-06 (×8): 10 [IU] via SUBCUTANEOUS
  Filled 2021-02-02: qty 10

## 2021-02-02 NOTE — Consult Note (Addendum)
Cardiology Consultation:   Patient ID: Brandon [name] MRN: [id number]; DOB: [birth year]  Admit date: 02/01/2021 Date of Consult: 02/02/2021  Primary Care Provider: Pcp, No [facility] Cardiologist: Follows at the [place]. [facility] Electrophysiologist:  None    Patient Profile:   Brandon [name] is a 75 y.o. male with a history of CAD with recent NSTEMI in 12/2020 s/p atherectomy/DES of LAD, paroxysmal atrial tachycardia, PAD, thoracic aortic aneurysm stable on most recent CTA in 12/2020, CVA in 09/2020, COPD on 3L of O2, hypertension, type 2 diabetes mellitus, chronic pancreatitis, alcohol abuse, and hypoadolsteronsim who is being seen today for the evaluation of CHF and elevated troponin at the request of Dr. Verlon Au.  History of Present Illness:   Brandon [name] is a 75 year old male with the above history who follows at the [place]. Patient was recently admitted from 18/2022 to 01/14/2021 for acute on chronic hypoxic respiratory failure secondary to possible COPD exacerbation and NSTEMI. High-sensitivity troponin peaked at 1,258. Echo showed LVEF of 60-65% with normal wall motion, and mild LVH. RV moderately enlarged with moderately reduced systolic function and severely elevated PASP of 64.6 mmHg. Patient underwent right/left cardiac catheterization on 01/11/2021 showed severe calcific single vessel CAD with a severely calcified proximal LAD. Patient had unsuccessful orbital atherectomy due to inability to fully treat lesion. However, he was taken back to the cath lab on 1/14 and underwent successful orbital atherectomy and stenting with DES to LAD lesion. He was started on DAPT with Aspirin and Plavix. He was treated with steroid and nebulizer/inhaler for COPD exacerbation. He also received a few dose of IV Lasix.   Patient presented to the ED on 02/01/2021 via EMS for worsening shortness of breath. Upon EMS arrival, O2 sats were 78% on 5L of nasal cannula. He was noted to have wheezing on exam. He was given  Solumedrol 125mg , Atrovent 0.5mg , Albuterol 10mg , and Magnesium 2g in route to the ED.  In the ED, patient initially required BiPAP. EKG showed normal sinus rhythm with no acute ischemic changes. High-sensitivity troponin elevated at 251 >> 272 (down from where it was in 12/2020). BNP minimally elevated at 136.2 (down from where it was in 12/2020). Chest x-ray showed cardiomegaly with findings suggestive of CHF with bilateral growing pleural effusion and worsening diffuse airspace opacities which may represent progressive pulmonary edema or underlying atypical infectious process. D-dimer 1.30. WBC 10.8, Hgb 10.3, Plts 397. Na 134, K 5.2, Glucose 221, BUN 16, Cr 1.18. LFTs normal. COVID-19 negative. He was started on IV Lasix and admitted. Cardiology consulted for assistance.  At the time of this evaluation, patient off BiPAP and back on nasal cannula. He states he has chronic shortness of breath and is typically on 4.5 L of O2 at home but reports worsening shortness of breath over the last week. This acutely worsened over the last couple of days. He reports it is worse with activity such as walking. He denies any orthopnea. He notes some chronic lower extremity edema especially of his left but this is stable. He estimates he is actually down 20 lbs over the last month. He denies any chest pain. No fevers or cough. He currently feels he is breathing a little better since being admitted.   Past Medical History:  Diagnosis Date  . Abdominal aortic aneurysm without rupture ([place])   . CAD (coronary artery disease)    severe on chest CT   . Chronic alcoholism ([place])   . Chronic idiopathic constipation   . Chronic  pancreatitis ([place])   . Chronic respiratory failure with hypoxia ([place])   . Essential hypertension   . GERD (gastroesophageal reflux disease)   . Hyperaldosteronism ([place])   . Malignant neoplasm of prostate ([place])   . Nicotine dependence   . Paroxysmal atrial tachycardia ([place])   . Peripheral  vascular disease ([place])   . Serrated polyp of colon   . Type 2 diabetes mellitus ([place])     Past Surgical History:  Procedure Laterality Date  . CORONARY ATHERECTOMY N/A 01/11/2021   Procedure: CORONARY ATHERECTOMY;  Surgeon: Sherren Mocha, MD;  Location: [place] [facility];  Service: Cardiovascular;  Laterality: N/A;  . CORONARY ATHERECTOMY N/A 01/13/2021   Procedure: CORONARY ATHERECTOMY;  Surgeon: Martinique, Peter M, MD;  Location: [place] [facility];  Service: Cardiovascular;  Laterality: N/A;  . CORONARY STENT INTERVENTION N/A 01/13/2021   Procedure: CORONARY STENT INTERVENTION;  Surgeon: Martinique, Peter M, MD;  Location: [place] [facility];  Service: Cardiovascular;  Laterality: N/A;  . INTRAVASCULAR ULTRASOUND/IVUS N/A 01/13/2021   Procedure: Intravascular Ultrasound/IVUS;  Surgeon: Martinique, Peter M, MD;  Location: [place] [facility];  Service: Cardiovascular;  Laterality: N/A;  . PROSTATECTOMY  12/2012  . RIGHT/LEFT HEART CATH AND CORONARY ANGIOGRAPHY N/A 01/11/2021   Procedure: RIGHT/LEFT HEART CATH AND CORONARY ANGIOGRAPHY;  Surgeon: Sherren Mocha, MD;  Location: [place] [facility];  Service: Cardiovascular;  Laterality: N/A;     Home Medications:  Prior to Admission medications   Medication Sig Start Date End Date Taking? Authorizing Provider  aspirin 81 MG EC tablet Take 1 tablet (81 mg total) by mouth daily. 10/21/20  Yes Little Ishikawa, MD  clopidogrel (PLAVIX) 75 MG tablet Take 1 tablet (75 mg total) by mouth daily. 10/22/20  Yes Little Ishikawa, MD  OXYGEN Inhale 4.5 L/min into the lungs.   Yes [provider]  acetaminophen (TYLENOL) 325 MG tablet Take 2 tablets (650 mg total) by mouth every 6 (six) hours as needed for mild pain (or Fever >/= 101). 01/14/21   Raiford Noble Latif, DO  albuterol (VENTOLIN HFA) 108 (90 Base) MCG/ACT inhaler Inhale 2 puffs into the lungs every 6 (six) hours as needed for wheezing or shortness of breath.  11/24/19   [provider]  atorvastatin (LIPITOR) 40 MG tablet Take 40 mg by mouth at bedtime.  06/11/19   [provider]  bisoprolol (ZEBETA) 5 MG tablet Take 1 tablet (5 mg total) by mouth daily. 01/14/21   Raiford Noble Latif, DO  diltiazem (CARDIZEM CD) 240 MG 24 hr capsule Take 1 capsule (240 mg total) by mouth daily. 01/14/21   Raiford Noble Latif, DO  Ensure Max Protein (ENSURE MAX PROTEIN) LIQD Take 330 mLs (11 oz total) by mouth at bedtime. 01/14/21   Raiford Noble Latif, DO  feeding supplement, GLUCERNA SHAKE, (GLUCERNA SHAKE) LIQD Take 237 mLs by mouth 2 (two) times daily between meals. 01/14/21   Raiford Noble Latif, DO  ferrous sulfate 325 (65 FE) MG tablet Take 1 tablet (325 mg total) by mouth 2 (two) times daily with a meal. 01/14/21   Sheikh, Omair Latif, DO  Fluticasone-Salmeterol (ADVAIR) 250-50 MCG/DOSE AEPB Inhale 1 puff into the lungs 2 (two) times daily.    [provider]  insulin aspart protamine - aspart (NOVOLOG 70/30 MIX) (70-30) 100 UNIT/ML FlexPen Inject 0.2 mLs (20 Units total) into the skin 2 (two) times daily with a meal. 01/14/21   Sheikh, Omair Latif, DO  magnesium oxide (MAG-OX) 400 (  241.3 Mg) MG tablet Take 1 tablet (400 mg total) by mouth daily. 01/14/21   Raiford Noble Latif, DO  metFORMIN (GLUCOPHAGE) 1000 MG tablet Take 1,000 mg by mouth 2 (two) times daily with a meal.     [provider]  Multiple Vitamin (MULTIVITAMIN WITH MINERALS) TABS tablet Take 1 tablet by mouth daily. 01/14/21   Sheikh, Omair Latif, DO  Omega-3 1000 MG CAPS Take 1,000 mg by mouth in the morning and at bedtime.     [provider]  oxybutynin (DITROPAN-XL) 10 MG 24 hr tablet Take 10 mg by mouth daily.    [provider]  pantoprazole (PROTONIX) 40 MG tablet Take 1 tablet (40 mg total) by mouth daily. 01/14/21   Sheikh, Omair Latif, DO  senna-docusate (SENOKOT-S) 8.6-50 MG tablet Take 1 tablet by mouth at bedtime as needed for mild constipation. 01/14/21   Raiford Noble Latif, DO  Tiotropium Bromide Monohydrate 2.5 MCG/ACT AERS Inhale 2 puffs into the lungs daily.    [provider]  vitamin B-12 1000 MCG tablet Take 1 tablet (1,000 mcg total) by mouth daily. 01/14/21   Kerney Elbe, DO    Inpatient Medications: Scheduled Meds: . aspirin EC  325 mg Oral Daily  . atorvastatin  40 mg Oral QHS  . bisoprolol  5 mg Oral Daily  . clopidogrel  75 mg Oral Daily  . diltiazem  240 mg Oral Daily  . enoxaparin (LOVENOX) injection  40 mg Subcutaneous Q24H  . feeding supplement (GLUCERNA SHAKE)  237 mL Oral BID BM  . ferrous sulfate  325 mg Oral BID WC  . fluticasone furoate-vilanterol  1 puff Inhalation Daily  . furosemide  40 mg Intravenous Daily  . insulin aspart  0-15 Units Subcutaneous TID WC  . insulin aspart  0-5 Units Subcutaneous QHS  . insulin aspart protamine- aspart  10 Units Subcutaneous BID WC  . magnesium oxide  400 mg Oral Daily  . oxybutynin  10 mg Oral Daily  . Ensure Max Protein  11 oz Oral QHS  . sodium chloride flush  3 mL Intravenous Q12H  . umeclidinium bromide  1 puff Inhalation Daily   Continuous Infusions: . sodium chloride    . doxycycline (VIBRAMYCIN) IV 100 mg (02/02/21 1224)   PRN Meds: sodium chloride, acetaminophen **OR** acetaminophen, albuterol, ondansetron **OR** ondansetron (ZOFRAN) IV, senna-docusate, sodium chloride flush  Allergies:    Allergies  Allergen Reactions  . Hctz [Hydrochlorothiazide]     Social History:   Social History   Socioeconomic History  . Marital status: Divorced    Spouse name: Not on file  . Number of children: Not on file  . Years of education: Not on file  . Highest education level: Not on file  Occupational History  . Not on file  Tobacco Use  . Smoking status: Former Smoker    Packs/day: 0.50    Years: 60.00    Pack years: 30.00    Types: Cigarettes    Quit date: 10/08/2020    Years since quitting: 0.3  . Smokeless tobacco: Never Used  Substance and  Sexual Activity  . Alcohol use: Not Currently  . Drug use: Never  . Sexual activity: Not on file  Other Topics Concern  . Not on file  Social History Narrative  . Not on file   Social Determinants of Health   Financial Resource Strain: Not on file  Food Insecurity: Not on file  Transportation Needs: Not on file  Physical  Activity: Not on file  Stress: Not on file  Social Connections: Not on file  Intimate Partner Violence: Not on file    Family History:    Family History  Problem Relation Age of Onset  . Hypertension Mother   . Hypertension Father      ROS:  Please see the history of present illness.  All other ROS reviewed and negative.     Physical Exam/Data:   Vitals:   02/02/21 1115 02/02/21 1200 02/02/21 1230 02/02/21 1245  BP: 125/78 136/76 135/75 136/76  Pulse: (!) 108 (!) 108 (!) 108 (!) 106  Resp: 19 20 18 18   Temp:      SpO2: 100% 92% 100% 100%  Weight:        Intake/Output Summary (Last 24 hours) at 02/02/2021 1419 Last data filed at 02/02/2021 0246 Gross per 24 hour  Intake 500 ml  Output 850 ml  Net -350 ml   Last 3 Weights 02/02/2021 01/14/2021 01/13/2021  Weight (lbs) 177 lb 7.5 oz 165 lb 9.6 oz 169 lb 8.5 oz  Weight (kg) 80.5 kg 75.116 kg 76.9 kg     Body mass index is 20.51 kg/m.  General: 75 y.o. male in no acute distress.  HEENT: Normocephalic and atraumatic. Sclera clear.  Neck: Supple. No JVD. Heart: Mildly tachycardic. Distinct S1 and S2. No murmurs, gallops, or rubs. Radial pulses 2+ and equal bilaterally. Lungs: Decreased breath sounds in bases but no significant wheezes, rhonchi, or rales. Abdomen: Soft, mildly distended, and non-tender to palpation. Bowel sounds present. MSK: Normal strength and tone for age. Extremities: Woody firm lower extremity edema with hyperpigmentation of bilateral lower extremities. Skin: Warm and dry. Neuro: Alert and oriented x3. No focal deficits. Psych: Normal affect. Responds appropriately.  EKG:  The  EKG was personally reviewed and demonstrates:  Normal sinus rhythm with no acute ST/T changes.   Telemetry:  Telemetry was personally reviewed and demonstrates:  Sinus rhythm with rates in the 90's to low 100's with occasional PVC. Brief episodes of paroxysmal atrial tachycardia.  Relevant CV Studies:  Echocardiogram 01/09/2021: Impressions:] 1. Left ventricular ejection fraction, by estimation, is 60 to 65%. The  left ventricle has normal function. The left ventricle has no regional  wall motion abnormalities. There is mild concentric left ventricular  hypertrophy and moderate basal septal  hypertrophy.  2. Right ventricular systolic function is moderately reduced. The right  ventricular size is moderately enlarged. There is severely elevated  pulmonary artery systolic pressure. The estimated right ventricular  systolic pressure is [id number] mmHg.  3. The mitral valve is degenerative. No evidence of mitral valve  regurgitation. No evidence of mitral stenosis.  4. The inferior vena cava is dilated in size with >50% respiratory  variability, suggesting right atrial pressure of 8 mmHg.  5. The aortic valve is tricuspid. There is moderate calcification of the  aortic valve primarily of the left coronary cusp. There is moderate  thickening of the aortic valve. Aortic valve regurgitation is mild. Mild  to moderate aortic valve  sclerosis/calcification is present, without any evidence of aortic  stenosis.  6. There is mild dilatation at the level of the sinuses of Valsalva,  measuring 37 mm. There is mild dilatation of the ascending aorta,  measuring 42 mm.  7. Tricuspid valve regurgitation is mild to moderate.  8. Right atrial size was moderately dilated.  _______________  Right/Left Cardiac Catheterization 01/11/2021: 1.  Severe calcific single-vessel coronary artery disease involving a severely calcified eccentric proximal LAD  lesion 2.  Patent left main, patent left circumflex with  moderate nonobstructive stenosis, patent RCA with moderate nonobstructive stenosis 3.  Moderate pulmonary hypertension with normal pulmonary capillary wedge pressure/LVEDP 4.  Unsuccessful orbital atherectomy due to inability to fully treat the lesion.  Recommendations: Recommend another attempt at PCI from femoral access once the patient recovers from this procedure.  Hopefully femoral access will give better guide support and allow for completion of the procedure.  I think that the lack of contralateral aortic support and steep angulation of the left main/LAD origin contributed to technical challenges with this case.  Plan reviewed with the patient.  Continue aspirin and clopidogrel. _______________  Coronary Stent Intervention 01/13/2021:  Prox LAD lesion is 90% stenosed.  Prox LAD to Mid LAD lesion is 60% stenosed.  A drug-eluting stent was successfully placed using a STENT RESOLUTE ONYX 3.5X30.  Post intervention, there is a 0% residual stenosis.  Post intervention, there is a 0% residual stenosis.   1. Successful PCI of the proximal to mid LAD with orbital atherectomy and stenting with DES x 1 and IVUS guidance  Plan: DAPT for one year. Anticipate DC tomorrow if no complications.   Laboratory Data:  High Sensitivity Troponin:   Recent Labs  Lab 01/08/21 2108 01/09/21 0816 01/09/21 1017 02/01/21 2234 02/02/21 0532  TROPONINIHS 1,258* 1,140* 982* 251* 272*     Chemistry Recent Labs  Lab 02/01/21 1846 02/01/21 2234 02/01/21 2304 02/02/21 0532 02/02/21 0821  NA 134*  --  133* 133* 134*  K 5.2*  --  4.9 4.5 4.3  CL 104  --   --  101  --   CO2 17*  --   --  19*  --   GLUCOSE 221*  --   --  270*  --   BUN 16  --   --  14  --   CREATININE 1.18 1.03  --  1.09  --   CALCIUM 9.1  --   --  8.6*  --   GFRNONAA >60 >60  --  >60  --   ANIONGAP 13  --   --  13  --     Recent Labs  Lab 02/01/21 1846  PROT 6.1*  ALBUMIN 3.0*  AST 22  ALT 15  ALKPHOS 75  BILITOT 0.7    Hematology Recent Labs  Lab 02/01/21 1846 02/01/21 2234 02/01/21 2304 02/02/21 0532 02/02/21 0821  WBC 10.8* 8.5  --  6.6  --   RBC 4.37 3.97*  --  4.09*  --   HGB 10.3* 9.6* 10.9* 10.0* 10.5*  HCT 35.0* 31.4* 32.0* 32.1* 31.0*  MCV 80.1 79.1*  --  78.5*  --   MCH 23.6* 24.2*  --  24.4*  --   MCHC 29.4* 30.6  --  31.2  --   RDW 22.3* 22.0*  --  22.2*  --   PLT 397 361  --  357  --    BNP Recent Labs  Lab 02/01/21 1847  BNP 136.2*    DDimer  Recent Labs  Lab 02/01/21 2205  DDIMER 1.30*     Radiology/Studies:  DG Chest 1 View  Result Date: 02/01/2021 CLINICAL DATA:  Shortness of breath. EXAM: CHEST  1 VIEW COMPARISON:  January 07, 2021 FINDINGS: The heart size is enlarged. Aortic calcifications are noted. There are worsening diffuse bilateral airspace opacities with prominent interstitial lung markings. There are bilateral pleural effusions which have increased in size since the patient's prior chest x-ray.  There is no pneumothorax. No definite acute osseous abnormality. IMPRESSION: 1. Cardiomegaly with findings suggestive of congestive heart failure with bilateral growing pleural effusions. 2. Worsening diffuse airspace opacities may represent progressive pulmonary edema or an underlying atypical infectious process. 3. Emphysema. Electronically Signed   By: Constance Holster M.D.   On: 02/01/2021 18:16     Assessment and Plan:   Acute on Chronic Hypoxic Respiratory Failure - O2 sats 78% on 5L of nasal cannula on EMS arrival. Required BiPAP on admission but now back on nasal cannula. - Chest x-ray showed cardiomegaly with findings suggestive of CHF with bilateral growing pleural effusion and worsening diffuse airspace opacities which may represent progressive pulmonary edema or underlying atypical infectious process. - D-dimer elevated at 1.30. Chest CTA last month negative for PE. - Continue diuresis as as below. - Continue steroids per primary team.  Acute on  Chronic Diastolic CHF - BNP [id number] (down from where it was in 12/2020) - Chest x-ray concerning for CHF.  - Echo last month showed LVEF of 60-65% with normal wall motion. V moderately enlarged with moderately reduced systolic function and severely elevated PASP of 64.6 mmHg.  - Currently on IV Lasix 40mg  daily. 850 cc of documented urinary output so far. - Will increase IV Lasix to 40mg  twice daily to see if that help. - Continue to monitor daily weight, strict I/O's, and renal function.  Elevated Troponin History of CAD with Recent NSTEMI - Recent NSTEMI s/p orbital atherectomy/DES to LAD in 12/2020.  - EKG shows no acute ischemic changes.  - High-sensitivity troponin 251 >> 272.  - No chest pain. - Continue DAPT with Aspirin and Plavix. - Continue Bisoprolol 5mg  daily and Lipitor 40mg  daily.   Paroxysmal Atrial Tachycardia - Currently in sinus rhythm with rates reasonably well controlled in the 90's to low 100's. Brief episodes of PAT. - Continue 240mg  daily and Bisoprolol 5mg  daily.  - Suspect rates will improve as underlying respiratory status improves.  Hypertension - BP mostly well controlled.  - Continue Cardizem and Bisoprolol as above.  Hyperlipidemia - Continue home statin.  Thoracic Aortic Aneurysm - Stable at 4.7cm on CTA in 12/2020.  - Will need to be monitor as an outpatient at the [place].  Otherwise, per primary team: - COPD on 4.5 L of O2 at home - Type 2 diabetes - Chronic pancreatitis - Chronic anemia - Hypothyroidism   Risk Assessment/Risk Scores:   New York Heart Association (NYHA) Functional Class NYHA Class III. Complicated by severe COPD on 4.5 L of O2 at home.   For questions or updates, please contact [place] Please consult [email or web address] for contact info under    Signed, Darreld Mclean, PA-C  02/02/2021 2:19 PM As above, patient seen and examined.  Briefly he is a 75 year old male with past medical history of coronary artery disease  status post recent PCI of LAD, paroxysmal atrial tachycardia, thoracic aortic aneurysm, previous CVA, severe home O2 dependent COPD, hypertension, diabetes mellitus, alcohol abuse for evaluation of acute diastolic congestive heart failure.  Recently admitted with non-ST elevation myocardial infarction and respiratory failure.  Had PCI of his LAD.  Echocardiogram showed normal LV function, moderate right ventricular enlargement with moderate RV dysfunction and severe pulmonary hypertension.  Patient states that for the past week he has had worsening dyspnea on exertion.  He denies orthopnea but did describe bilateral lower extremity edema.  He states he has lost 20 pounds over the past month.  He denies chest pain,  fevers, chills or productive cough.  He presented for further evaluation and cardiology asked to evaluate.  Note patient was given Lasix and also Solu-Medrol, Atrovent and albuterol and feels better.  Chest x-ray shows possible cardiac enlargement, mild CHF and bilateral pleural effusions.  Creatinine 1.09, hemoglobin 10.5.  Electrocardiogram shows sinus tachycardia with no ST changes.  1 acute diastolic congestive heart failure-patient presented with worsening dyspnea.  Chest x-ray shows bilateral pleural effusions and edema.  Likely has poor reserve given baseline lung disease.  Will treat with Lasix 40 mg twice daily.  Follow potassium and renal function closely.  Recent echocardiogram showed normal LV function but there was RV dysfunction.  2 elevated troponin-minimal elevation in troponin with no clear trend.  Electrocardiogram shows no ST changes.  Patient denies chest pain.  No plans for further ischemia evaluation.  3 coronary artery disease-continue aspirin, Plavix and statin.  4 severe COPD-continue pulmonary toilet per primary service.  5 hyperlipidemia-continue statin.  6 history of paroxysmal atrial tachycardia-patient is in sinus rhythm.  We will continue Cardizem and  beta-blocker.  7 hypertension-blood pressure controlled.  Continue present medical regimen.  Kirk Ruths, MD

## 2021-02-02 NOTE — ED Notes (Signed)
Pt requesting to come off bipap; placed back on 5L [place]

## 2021-02-02 NOTE — Progress Notes (Signed)
Pt. Placed on 15L [place] at this time, sat low 90's. RT will continue to monitor. RN made aware

## 2021-02-02 NOTE — ED Notes (Signed)
Breakfast ordered 

## 2021-02-02 NOTE — Plan of Care (Signed)
  Problem: Education: Goal: Knowledge of General Education information will improve Description Including pain rating scale, medication(s)/side effects and non-pharmacologic comfort measures Outcome: Progressing   

## 2021-02-02 NOTE — Progress Notes (Signed)
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 L of oxygen baseline-with continued tobacco abuse EtOH abuse, chronic pancreatitis HTN GERD PAD  Recent baseline poor functional effort can walk 2-3 steps with severe shortness of breath  Recent admission 1/8 through 01/14/2021 with progressive shortness of breath-had heart cath secondary to NSTEMI--also has had previous admission 10/14 through 10/21/2020 unilateral weakness found to have acute stroke on imaging-patient was placed on Plavix and Pletal  Came to emergency room with increased respiratory effort 78% on 5 L decreased lung sounds given Solu-Medrol Atrovent albuterol magnesium-eventually placed on nonrebreather and then BiPAP  Assessment & Plan:   Active Problems:   Essential hypertension   Type 2 diabetes mellitus (HCC)   CAD (coronary artery disease)   Acute on chronic respiratory failure with hypoxemia (HCC)   COPD with acute exacerbation (HCC)   COPD exacerbation (HCC)   (HFpEF) heart failure with preserved ejection fraction ([place])   1. Acute hypoxic respiratory failure superimposed on COPD on 3 L oxygen a. Etiology not completely clear but most likely secondary to acute coronary issue as below b. Required BiPAP initially on admission but is now down to high flow nasal cannula-still requires stepdown level management versus progressive c. Reasonable at this time to place on p.o. steroids-discontinue Solu-Medrol 125 every 8-can continue Breo Ellipta daily, albuterol every 4 as needed d. Likely can discontinue doxycycline in the next 24 hours as do not think this is COPD alone e. Continue oxygen at 10 L high flow but can de-escalate and wean as tolerated f. Given Lasix 20x1 on admission we will repeat as 40 mg  IV daily as his Lasix nave and was not on this at home 2. Recent NSTEMI status post stent placement 3. HFpEF a. Troponins significantly elevated up to the 270 range on 2/3 b. Non-emergently consult cardiology to see c. BNP is not severely elevated although patient states that he has gained some weight recently d. At this time continue bisoprolol 5 daily Plavix 75 daily e. I will load him with aspirin 325 mg now f. Checking his EKG this morning shows mild ST depressions across the precordium compared to prior EKG 01/14/2021 g. Defer to cardiology further plan 4. DM TY 2 a. Reduce 70/30 insulin to 10 units twice daily as is n.p.o. b. Hold Metformin 1000 twice daily at this time as may need repeat cath 5. Chronic microcytic anemia with underlying B12 deficiency a. Outpatient evaluation 6. HTN a. Can continue Cardizem 240 daily b. Other meds as above 7. HLD 8. Hypothyroid 9. Prior EtOH abuse with pancreatitis  DVT prophylaxis: Lovenox Code Status: Full Family Communication: Daughter Mardene Celeste [phone] called on telephone Disposition:  Status is: Inpatient  Remains inpatient appropriate because:Persistent severe electrolyte disturbances, Ongoing active pain requiring inpatient pain management, IV treatments appropriate due to intensity of illness or inability to take PO and Inpatient level of care appropriate due to severity of illness   Dispo: The patient is from: Home              Anticipated d/c is to: Home              Anticipated d/c date is: 3 days              Patient currently is not  medically stable to d/c.   Difficult to place patient NO Consultants:   Cardiology  Procedures: None yet  Antimicrobials: Doxycycline   Subjective: Feels a little better still quite short of breath Came off of BiPAP earlier this morning Tells me no fever no chills recently-does endorse lower extremity swelling over the last several days-1 week post hospital stay states he was  starting to get more short of breath to the point that he could not really ambulate He has no current chest pain no fever no sputum no nausea no vomiting although he is hungry  Objective: Vitals:   02/02/21 0445 02/02/21 0500 02/02/21 0515 02/02/21 0530  BP: 132/67 131/68 117/69 126/65  Pulse: (!) 101 (!) 101 98 99  Resp: 13 14 14 19   Temp:      SpO2: 95% 90% 100% 100%    Intake/Output Summary (Last 24 hours) at 02/02/2021 0745 Last data filed at 02/02/2021 0246 Gross per 24 hour  Intake 500 ml  Output 850 ml  Net -350 ml   There were no vitals filed for this visit.  Examination:  Alert coherent no distress thin appearing black male No JVD no bruit Anicteric no pallor S1-S2 sinus Chest is clear no wheeze no rales no rhonchi no crackles Abdomen is soft nontender Chronic venous stasis changes with significantly hyper keratinized lower extremities indicative of prior swelling Neurologically intact no focal deficit moving all 4 limbs equally  Data Reviewed: I have personally reviewed following labs and imaging studies  Sodium 133 potassium 4.5 BUN/creatinine 14/1.0 Troponin II 51->272 Hemoglobin 9.6-->10.0 White count 6.0 Platelet 357 Procalcitonin less than 0.10  COVID-19 Labs  Recent Labs    02/01/21 2205  DDIMER 1.30*    Lab Results  Component Value Date   [place]ARSCOV2NAA NEGATIVE 01/07/2021   [place] NEGATIVE 10/21/2020   [place] NEGATIVE 10/13/2020     Radiology Studies: DG Chest 1 View  Result Date: 02/01/2021 CLINICAL DATA:  Shortness of breath. EXAM: CHEST  1 VIEW COMPARISON:  January 07, 2021 FINDINGS: The heart size is enlarged. Aortic calcifications are noted. There are worsening diffuse bilateral airspace opacities with prominent interstitial lung markings. There are bilateral pleural effusions which have increased in size since the patient's prior chest x-ray. There is no pneumothorax. No definite acute osseous abnormality. IMPRESSION: 1.  Cardiomegaly with findings suggestive of congestive heart failure with bilateral growing pleural effusions. 2. Worsening diffuse airspace opacities may represent progressive pulmonary edema or an underlying atypical infectious process. 3. Emphysema. Electronically Signed   By: Constance Holster M.D.   On: 02/01/2021 18:16     Scheduled Meds: . aspirin EC  81 mg Oral Daily  . atorvastatin  40 mg Oral QHS  . bisoprolol  5 mg Oral Daily  . clopidogrel  75 mg Oral Daily  . diltiazem  240 mg Oral Daily  . enoxaparin (LOVENOX) injection  40 mg Subcutaneous Q24H  . feeding supplement (GLUCERNA SHAKE)  237 mL Oral BID BM  . ferrous sulfate  325 mg Oral BID WC  . fluticasone furoate-vilanterol  1 puff Inhalation Daily  . insulin aspart  0-15 Units Subcutaneous TID WC  . insulin aspart  0-5 Units Subcutaneous QHS  . insulin aspart protamine- aspart  20 Units Subcutaneous BID WC  . magnesium oxide  400 mg Oral Daily  . metFORMIN  1,000 mg Oral BID WC  . methylPREDNISolone (SOLU-MEDROL) injection  125 mg Intravenous Q8H  . oxybutynin  10 mg Oral Daily  . Ensure Max  Protein  11 oz Oral QHS  . sodium chloride flush  3 mL Intravenous Q12H  . umeclidinium bromide  1 puff Inhalation Daily   Continuous Infusions: . sodium chloride    . doxycycline (VIBRAMYCIN) IV Stopped (02/02/21 0142)     LOS: 1 day    Time spent: [place], MD [facility] To contact the attending provider between 7A-7P or the covering provider during after hours 7P-7A, please log into the web site [email or web address] and access using universal [place] password for that web site. If you do not have the password, please call the hospital operator.  02/02/2021, 7:45 AM

## 2021-02-02 NOTE — ED Notes (Signed)
Pt with increased work of breathing. Oxygen saturations at 81% on 10L HFNC. Increased to 12L and external male catheter placed. Respiratory called for ABG.

## 2021-02-02 NOTE — Plan of Care (Signed)
Initiate Care Plan Problem: Education: Goal: Knowledge of General Education information will improve Description: Including pain rating scale, medication(s)/side effects and non-pharmacologic comfort measures Outcome: Progressing   Problem: Health Behavior/Discharge Planning: Goal: Ability to manage health-related needs will improve Outcome: Progressing   Problem: Clinical Measurements: Goal: Ability to maintain clinical measurements within normal limits will improve Outcome: Progressing Goal: Will remain free from infection Outcome: Progressing Goal: Diagnostic test results will improve Outcome: Progressing Goal: Respiratory complications will improve Outcome: Progressing Goal: Cardiovascular complication will be avoided Outcome: Progressing   Problem: Activity: Goal: Risk for activity intolerance will decrease Outcome: Progressing   Problem: Nutrition: Goal: Adequate nutrition will be maintained Outcome: Progressing   Problem: Coping: Goal: Level of anxiety will decrease Outcome: Progressing   Problem: Elimination: Goal: Will not experience complications related to bowel motility Outcome: Progressing Goal: Will not experience complications related to urinary retention Outcome: Progressing   Problem: Pain Managment: Goal: General experience of comfort will improve Outcome: Progressing   Problem: Safety: Goal: Ability to remain free from injury will improve Outcome: Progressing   Problem: Skin Integrity: Goal: Risk for impaired skin integrity will decrease Outcome: Progressing   Problem: Education: Goal: Knowledge of disease or condition will improve Outcome: Progressing Goal: Knowledge of the prescribed therapeutic regimen will improve Outcome: Progressing Goal: Individualized Educational Video(s) Outcome: Progressing   Problem: Activity: Goal: Ability to tolerate increased activity will improve Outcome: Progressing Goal: Will verbalize the importance  of balancing activity with adequate rest periods Outcome: Progressing   Problem: Respiratory: Goal: Ability to maintain a clear airway will improve Outcome: Progressing Goal: Levels of oxygenation will improve Outcome: Progressing Goal: Ability to maintain adequate ventilation will improve Outcome: Progressing

## 2021-02-02 NOTE — ED Notes (Signed)
Pt eating lunch

## 2021-02-02 NOTE — ED Notes (Signed)
Pt trialed off bipap briefly. Pt used the urinal, sats dropped to 61%, breathing labored at rate 40. Placed back on bipap

## 2021-02-03 LAB — CBC WITH DIFFERENTIAL/PLATELET
Abs Immature Granulocytes: 0.07 10*3/uL (ref 0.00–0.07)
Basophils Absolute: 0 10*3/uL (ref 0.0–0.1)
Basophils Relative: 0 %
Eosinophils Absolute: 0 10*3/uL (ref 0.0–0.5)
Eosinophils Relative: 0 %
HCT: 33.3 % — ABNORMAL LOW (ref 39.0–52.0)
Hemoglobin: 10.2 g/dL — ABNORMAL LOW (ref 13.0–17.0)
Immature Granulocytes: 1 %
Lymphocytes Relative: 4 %
Lymphs Abs: 0.6 10*3/uL — ABNORMAL LOW (ref 0.7–4.0)
MCH: 23.9 pg — ABNORMAL LOW (ref 26.0–34.0)
MCHC: 30.6 g/dL (ref 30.0–36.0)
MCV: 78.2 fL — ABNORMAL LOW (ref 80.0–100.0)
Monocytes Absolute: 0.8 10*3/uL (ref 0.1–1.0)
Monocytes Relative: 5 %
Neutro Abs: 14.1 10*3/uL — ABNORMAL HIGH (ref 1.7–7.7)
Neutrophils Relative %: 90 %
Platelets: 410 10*3/uL — ABNORMAL HIGH (ref 150–400)
RBC: 4.26 MIL/uL (ref 4.22–5.81)
RDW: 21.9 % — ABNORMAL HIGH (ref 11.5–15.5)
WBC: 15.5 10*3/uL — ABNORMAL HIGH (ref 4.0–10.5)
nRBC: 0.1 % (ref 0.0–0.2)

## 2021-02-03 LAB — COMPREHENSIVE METABOLIC PANEL
ALT: 13 U/L (ref 0–44)
AST: 20 U/L (ref 15–41)
Albumin: 2.9 g/dL — ABNORMAL LOW (ref 3.5–5.0)
Alkaline Phosphatase: 74 U/L (ref 38–126)
Anion gap: 11 (ref 5–15)
BUN: 15 mg/dL (ref 8–23)
CO2: 24 mmol/L (ref 22–32)
Calcium: 8.9 mg/dL (ref 8.9–10.3)
Chloride: 101 mmol/L (ref 98–111)
Creatinine, Ser: 1.06 mg/dL (ref 0.61–1.24)
GFR, Estimated: >60 mL/min (ref >60)
Glucose, Bld: 185 mg/dL — ABNORMAL HIGH (ref 70–99)
Potassium: 4.2 mmol/L (ref 3.5–5.1)
Sodium: 136 mmol/L (ref 135–145)
Total Bilirubin: 1.3 mg/dL — ABNORMAL HIGH (ref 0.3–1.2)
Total Protein: 5.7 g/dL — ABNORMAL LOW (ref 6.5–8.1)

## 2021-02-03 LAB — GLUCOSE, CAPILLARY
Glucose-Capillary: 173 mg/dL — ABNORMAL HIGH (ref 70–99)
Glucose-Capillary: 188 mg/dL — ABNORMAL HIGH (ref 70–99)
Glucose-Capillary: 235 mg/dL — ABNORMAL HIGH (ref 70–99)
Glucose-Capillary: 178 mg/dL — ABNORMAL HIGH (ref 70–99)

## 2021-02-03 MED ORDER — INSULIN ASPART 100 UNIT/ML ~~LOC~~ SOLN
0.0000 [IU] | Freq: Three times a day (TID) | SUBCUTANEOUS | Status: DC
  Administered 2021-02-03: 17:00:00 5 [IU] via SUBCUTANEOUS
  Administered 2021-02-03 (×2): 3 [IU] via SUBCUTANEOUS
  Administered 2021-02-04 (×2): 5 [IU] via SUBCUTANEOUS
  Administered 2021-02-04 – 2021-02-05 (×2): 2 [IU] via SUBCUTANEOUS
  Administered 2021-02-05: 17:00:00 5 [IU] via SUBCUTANEOUS
  Administered 2021-02-05: 12:00:00 15 [IU] via SUBCUTANEOUS
  Administered 2021-02-06: 8 [IU] via SUBCUTANEOUS
  Administered 2021-02-06: 2 [IU] via SUBCUTANEOUS

## 2021-02-03 MED ORDER — IPRATROPIUM-ALBUTEROL 0.5-2.5 (3) MG/3ML IN SOLN: 3.0000 mL | RESPIRATORY_TRACT | Status: DC | PRN

## 2021-02-03 MED ORDER — FUROSEMIDE 40 MG PO TABS
40.0000 mg | ORAL_TABLET | Freq: Every day | ORAL | Status: DC
  Filled 2021-02-03: qty 1

## 2021-02-03 MED ORDER — FUROSEMIDE 40 MG PO TABS
40.0000 mg | ORAL_TABLET | Freq: Every day | ORAL | Status: DC
  Administered 2021-02-04 – 2021-02-06 (×3): 40 mg via ORAL
  Filled 2021-02-03 (×3): qty 1

## 2021-02-03 NOTE — Plan of Care (Signed)

## 2021-02-03 NOTE — Progress Notes (Signed)
PROGRESS NOTE   Brandon [name]  JYN:[id number] DOB: 11-Oct-1946 DOA: 02/01/2021 PCP: Pcp, No  Brief Narrative:  75 year old male from home-lives alone wife died 3 years ago ambulates with walker History of CAD, thoracic artery aneurysm, COPD Gold stage III-IV on 3 L of oxygen baseline-with continued tobacco abuse\EtOH abuse, chronic pancreatitis, HTN, GERD, PAD.  Patient presents with severe dyspnea with exertion, ambulating up to 3 or 4 steps before needing to break or rest.  Of note patient was recently admitted 1/8 through 01/14/2021 with progressive shortness of breath-had heart cath secondary to NSTEMI--also has had previous admission 10/14 through 10/21/2020 unilateral weakness found to have acute stroke on imaging-patient was placed on Plavix and Pletal. Came to emergency room with increased respiratory effort 78% on 5 L decreased lung sounds given Solu-Medrol Atrovent albuterol magnesium-eventually placed on nonrebreather and then BiPAP  Assessment & Plan:   Active Problems:   Essential hypertension   Type 2 diabetes mellitus (HCC)   CAD (coronary artery disease)   Acute on chronic respiratory failure with hypoxemia (HCC)   COPD with acute exacerbation (HCC)   COPD exacerbation (HCC)   (HFpEF) heart failure with preserved ejection fraction (HCC)  Acute hypoxic respiratory failure superimposed on chronic hypoxia due to acute COPD exacerbation (3 L [place])  -Initially requiring BiPAP at intake but now able to be weaned down markedly aggressively over the past 24 hours with steroids and nebulizers SpO2: 97 % O2 Flow Rate (L/min): 4.5 L/min (MD adjusted flow rate) FiO2 (%): 50 % Continue Breo Ellipta daily, albuterol every 2h as needed Discontinue antibiotics given reassuring chest x-ray and labs Patient continues to be markedly dyspneic with even minimal exertion, will likely need another 48 to 72 hours of steroids nebulizers and close monitoring at minimum  Recent NSTEMI status post stent  placement, without chest pain History of heart failure: HFpEF High-sensitivity troponins minimally elevated in the 200s secondary to acute hypoxia as above Cardiology requested to follow-up at intake given his recent NSTEMI and stenting Continue bisoprolol 5 daily Plavix 75 daily Patient received IV Lasix at intake as well, defer to cardiology for continuation  Insulin-dependent diabetes type 2 Reduce 70/30 insulin to 10 units twice daily Lab Results  Component Value Date   HGBA1C 6.4 (H) 01/07/2021  Hold Metformin 1000 twice daily at this time as may need repeat cath  HTN Currently on bisoprolol, cardizem Defer to cardiology  HLD Hypothyroid Prior EtOH abuse with pancreatitis  DVT prophylaxis: Lovenox Code Status: Full Family Communication: Daughter Mardene Celeste [phone] updated on telephone Disposition:  Status is: Inpatient  Remains inpatient appropriate because:Persistent severe electrolyte disturbances, Ongoing active pain requiring inpatient pain management, IV treatments appropriate due to intensity of illness or inability to take PO and Inpatient level of care appropriate due to severity of illness   Dispo: The patient is from: Home              Anticipated d/c is to: Home              Anticipated d/c date is: 3 days              Patient currently is not medically stable to d/c.  Consultants:   Cardiology  Procedures: None yet  Antimicrobials:  Doxycycline, discontinued   Subjective: No acute issues or events overnight, patient feels markedly improved since admission, denies any overt dyspnea in bed while supine but does report moderate to severe symptoms of dyspnea with even minimal exertion.  Remains off BiPAP.  Otherwise denies fevers, chills, nausea vomiting, diarrhea, constipation, chest pain.  Objective: Vitals:   02/02/21 1900 02/02/21 2042 02/02/21 2311 02/03/21 0320  BP: 131/69 128/74 121/70 140/70  Pulse: (!) 102 (!) 103 100 (!) 101  Resp: 16 20  19 19   Temp:  (!) 97.5 F (36.4 C) 97.9 F (36.6 C) 97.6 F (36.4 C)  TempSrc:  Oral Oral Oral  SpO2: (!) 89% 92% 97% 100%  Weight:        Intake/Output Summary (Last 24 hours) at 02/03/2021 0657 Last data filed at 02/03/2021 0600 Gross per 24 hour  Intake 874.35 ml  Output 1200 ml  Net -325.65 ml   Filed Weights   02/02/21 0818  Weight: 80.5 kg    Examination:  General:  Pleasantly resting in bed, No acute distress. HEENT:  Normocephalic atraumatic.  Sclerae nonicteric, noninjected.  Extraocular movements intact bilaterally. Neck:  Without mass or deformity.  Trachea is midline. Lungs: Diminished breath sounds bilaterally without overt rhonchi, wheeze, or rales. Heart:  Regular rate and rhythm.  Without murmurs, rubs, or gallops. Abdomen:  Soft, nontender, nondistended.  Without guarding or rebound. Extremities: Without cyanosis, clubbing, edema, or obvious deformity. Vascular:  Dorsalis pedis and posterior tibial pulses palpable bilaterally. Skin:  Warm and dry, no erythema, no ulcerations.  Data Reviewed: I have personally reviewed following labs and imaging studies  COVID-19 Labs  Recent Labs    02/01/21 2205  DDIMER 1.30*    Lab Results  Component Value Date   [place] NEGATIVE 01/07/2021   [place] NEGATIVE 10/21/2020   [place] NEGATIVE 10/13/2020     Radiology Studies: DG Chest 1 View  Result Date: 02/01/2021 CLINICAL DATA:  Shortness of breath. EXAM: CHEST  1 VIEW COMPARISON:  January 07, 2021 FINDINGS: The heart size is enlarged. Aortic calcifications are noted. There are worsening diffuse bilateral airspace opacities with prominent interstitial lung markings. There are bilateral pleural effusions which have increased in size since the patient's prior chest x-ray. There is no pneumothorax. No definite acute osseous abnormality. IMPRESSION: 1. Cardiomegaly with findings suggestive of congestive heart failure with bilateral growing pleural effusions.  2. Worsening diffuse airspace opacities may represent progressive pulmonary edema or an underlying atypical infectious process. 3. Emphysema. Electronically Signed   By: Constance Holster M.D.   On: 02/01/2021 18:16     Scheduled Meds: . aspirin EC  325 mg Oral Daily  . atorvastatin  40 mg Oral QHS  . bisoprolol  5 mg Oral Daily  . clopidogrel  75 mg Oral Daily  . diltiazem  240 mg Oral Daily  . enoxaparin (LOVENOX) injection  40 mg Subcutaneous Q24H  . feeding supplement (GLUCERNA SHAKE)  237 mL Oral BID BM  . ferrous sulfate  325 mg Oral BID WC  . fluticasone furoate-vilanterol  1 puff Inhalation Daily  . furosemide  40 mg Intravenous BID  . insulin aspart  0-15 Units Subcutaneous TID WC  . insulin aspart  0-5 Units Subcutaneous QHS  . insulin aspart protamine- aspart  10 Units Subcutaneous BID WC  . magnesium oxide  400 mg Oral Daily  . oxybutynin  10 mg Oral Daily  . Ensure Max Protein  11 oz Oral QHS  . sodium chloride flush  3 mL Intravenous Q12H  . umeclidinium bromide  1 puff Inhalation Daily   Continuous Infusions: . sodium chloride    . doxycycline (VIBRAMYCIN) IV 100 mg (02/02/21 2231)     LOS: 2 days    Time spent: 32  [place], DO Pager: Secure chat  7 PM to 7 AM please see AMION for coverage   02/03/2021, 6:57 AM

## 2021-02-03 NOTE — Progress Notes (Signed)
Progress Note  Patient Name: Brandon [name] Date of Encounter: 02/03/2021  [facility] Cardiologist: Follows at [facility]  Subjective   Dyspnea with some improvement; no CP  Inpatient Medications    Scheduled Meds: . aspirin EC  325 mg Oral Daily  . atorvastatin  40 mg Oral QHS  . bisoprolol  5 mg Oral Daily  . clopidogrel  75 mg Oral Daily  . diltiazem  240 mg Oral Daily  . enoxaparin (LOVENOX) injection  40 mg Subcutaneous Q24H  . feeding supplement (GLUCERNA SHAKE)  237 mL Oral BID BM  . ferrous sulfate  325 mg Oral BID WC  . fluticasone furoate-vilanterol  1 puff Inhalation Daily  . furosemide  40 mg Intravenous BID  . insulin aspart  0-15 Units Subcutaneous TID WC  . insulin aspart  0-5 Units Subcutaneous QHS  . insulin aspart protamine- aspart  10 Units Subcutaneous BID WC  . magnesium oxide  400 mg Oral Daily  . oxybutynin  10 mg Oral Daily  . Ensure Max Protein  11 oz Oral QHS  . sodium chloride flush  3 mL Intravenous Q12H  . umeclidinium bromide  1 puff Inhalation Daily   Continuous Infusions: . sodium chloride    . doxycycline (VIBRAMYCIN) IV 100 mg (02/02/21 2231)   PRN Meds: sodium chloride, acetaminophen **OR** acetaminophen, ipratropium-albuterol, ondansetron **OR** ondansetron (ZOFRAN) IV, senna-docusate, sodium chloride flush   Vital Signs    Vitals:   02/02/21 2042 02/02/21 2311 02/03/21 0320 02/03/21 0800  BP: 128/74 121/70 140/70 123/69  Pulse: (!) 103 100 (!) 101 96  Resp: 20 19 19 17   Temp: (!) 97.5 F (36.4 C) 97.9 F (36.6 C) 97.6 F (36.4 C) 97.9 F (36.6 C)  TempSrc: Oral Oral Oral Oral  SpO2: 92% 97% 100% 100%  Weight:        Intake/Output Summary (Last 24 hours) at 02/03/2021 0951 Last data filed at 02/03/2021 0600 Gross per 24 hour  Intake 874.35 ml  Output 1200 ml  Net -325.65 ml   Last 3 Weights 02/02/2021 01/14/2021 01/13/2021  Weight (lbs) 177 lb 7.5 oz 165 lb 9.6 oz 169 lb 8.5 oz  Weight (kg) 80.5 kg 75.116 kg 76.9 kg       Telemetry    Sinus tachy with pacs and pvcs- Personally Reviewed  Physical Exam   GEN: No acute distress.   Neck: No JVD Cardiac: RRR Respiratory: Diminished BS throughout GI: Soft, nontender, non-distended  MS: No edema; chronic skin changes Neuro:  Nonfocal  Psych: Normal affect   Labs    High Sensitivity Troponin:   Recent Labs  Lab 01/08/21 2108 01/09/21 0816 01/09/21 1017 02/01/21 2234 02/02/21 0532  TROPONINIHS 1,258* 1,140* 982* 251* 272*      Chemistry Recent Labs  Lab 02/01/21 1846 02/01/21 2234 02/01/21 2304 02/02/21 0532 02/02/21 0821 02/03/21 0641  NA 134*  --    < > 133* 134* 136  K 5.2*  --    < > 4.5 4.3 4.2  CL 104  --   --  101  --  101  CO2 17*  --   --  19*  --  24  GLUCOSE 221*  --   --  270*  --  185*  BUN 16  --   --  14  --  15  CREATININE 1.18 1.03  --  1.09  --  1.06  CALCIUM 9.1  --   --  8.6*  --  8.9  PROT  6.1*  --   --   --   --  5.7*  ALBUMIN 3.0*  --   --   --   --  2.9*  AST 22  --   --   --   --  20  ALT 15  --   --   --   --  13  ALKPHOS 75  --   --   --   --  74  BILITOT 0.7  --   --   --   --  1.3*  GFRNONAA >60 >60  --  >60  --  >60  ANIONGAP 13  --   --  13  --  11   < > = values in this interval not displayed.     Hematology Recent Labs  Lab 02/01/21 2234 02/01/21 2304 02/02/21 0532 02/02/21 0821 02/03/21 0641  WBC 8.5  --  6.6  --  15.5*  RBC 3.97*  --  4.09*  --  4.26  HGB 9.6*   < > 10.0* 10.5* 10.2*  HCT 31.4*   < > 32.1* 31.0* 33.3*  MCV 79.1*  --  78.5*  --  78.2*  MCH 24.2*  --  24.4*  --  23.9*  MCHC 30.6  --  31.2  --  30.6  RDW 22.0*  --  22.2*  --  21.9*  PLT 361  --  357  --  410*   < > = values in this interval not displayed.    BNP Recent Labs  Lab 02/01/21 1847  BNP 136.2*     DDimer  Recent Labs  Lab 02/01/21 2205  DDIMER 1.30*     Radiology    DG Chest 1 View  Result Date: 02/01/2021 CLINICAL DATA:  Shortness of breath. EXAM: CHEST  1 VIEW COMPARISON:  January 07, 2021 FINDINGS: The heart size is enlarged. Aortic calcifications are noted. There are worsening diffuse bilateral airspace opacities with prominent interstitial lung markings. There are bilateral pleural effusions which have increased in size since the patient's prior chest x-ray. There is no pneumothorax. No definite acute osseous abnormality. IMPRESSION: 1. Cardiomegaly with findings suggestive of congestive heart failure with bilateral growing pleural effusions. 2. Worsening diffuse airspace opacities may represent progressive pulmonary edema or an underlying atypical infectious process. 3. Emphysema. Electronically Signed   By: Constance Holster M.D.   On: 02/01/2021 18:16    Patient Profile     75 year old male with past medical history of coronary artery disease status post recent PCI of LAD, paroxysmal atrial tachycardia, thoracic aortic aneurysm, previous CVA, severe home O2 dependent COPD, hypertension, diabetes mellitus, alcohol abuse for evaluation of acute diastolic congestive heart failure.  Recently admitted with non-ST elevation myocardial infarction and respiratory failure.  Had PCI of his LAD.  Echocardiogram showed normal LV function, moderate right ventricular enlargement with moderate RV dysfunction and severe pulmonary hypertension.   Assessment & Plan    1 acute diastolic congestive heart failure-symptoms have improved.  We will change Lasix to 40 mg by mouth daily.    2 elevated troponin-minimal elevation in troponin with no clear trend.  Electrocardiogram shows no ST changes.    He has not had chest pain.  No plans for further ischemia evaluation at this point.  3 coronary artery disease-continue aspirin, Plavix and statin.  4 severe COPD-continue pulmonary toilet per primary service.  5 hyperlipidemia-continue statin.  6 history of paroxysmal atrial tachycardia-patient remains in sinus rhythm.  Continue beta-blocker  and calcium blocker.  7 hypertension-blood  pressure controlled.  Continue present medical regimen.  Cardiology will sign off.  He will need bmet 1 week following discharge and follow-up closely with his physician at the [place].  Please call with questions.  For questions or updates, please contact [place] Please consult [email or web address] for contact info under        Signed, Kirk Ruths, MD  02/03/2021, 9:51 AM

## 2021-02-03 NOTE — TOC Progression Note (Addendum)
Transition of Care [facility]) - Progression Note    Patient Details  Name: Brandon [name] MRN: [id number] Date of Birth: 05-29-1946  Transition of Care [facility]) CM/SW Contact  Zenon Mayo, RN Phone Number: 02/03/2021, 12:27 PM  Clinical Narrative:    Patient is active with [facility] for [place], [place], [place], which was authorized thru the [place], he also has an aide thru [facility].  NCM spoke with daughter ,she states she would like to continue to work with [facility].         Expected Discharge Plan and Services                                                 Social Determinants of Health (SDOH) Interventions    Readmission Risk Interventions No flowsheet data found.

## 2021-02-04 LAB — CBC WITH DIFFERENTIAL/PLATELET
Abs Immature Granulocytes: 0.05 10*3/uL (ref 0.00–0.07)
Basophils Absolute: 0 10*3/uL (ref 0.0–0.1)
Basophils Relative: 0 %
Eosinophils Absolute: 0 10*3/uL (ref 0.0–0.5)
Eosinophils Relative: 0 %
HCT: 31.7 % — ABNORMAL LOW (ref 39.0–52.0)
Hemoglobin: 10.1 g/dL — ABNORMAL LOW (ref 13.0–17.0)
Immature Granulocytes: 0 %
Lymphocytes Relative: 7 %
Lymphs Abs: 0.9 10*3/uL (ref 0.7–4.0)
MCH: 24.4 pg — ABNORMAL LOW (ref 26.0–34.0)
MCHC: 31.9 g/dL (ref 30.0–36.0)
MCV: 76.6 fL — ABNORMAL LOW (ref 80.0–100.0)
Monocytes Absolute: 1.1 10*3/uL — ABNORMAL HIGH (ref 0.1–1.0)
Monocytes Relative: 9 %
Neutro Abs: 10.9 10*3/uL — ABNORMAL HIGH (ref 1.7–7.7)
Neutrophils Relative %: 84 %
Platelets: 390 10*3/uL (ref 150–400)
RBC: 4.14 MIL/uL — ABNORMAL LOW (ref 4.22–5.81)
RDW: 21.6 % — ABNORMAL HIGH (ref 11.5–15.5)
WBC: 13 10*3/uL — ABNORMAL HIGH (ref 4.0–10.5)
nRBC: 0.2 % (ref 0.0–0.2)

## 2021-02-04 LAB — COMPREHENSIVE METABOLIC PANEL
ALT: 15 U/L (ref 0–44)
AST: 29 U/L (ref 15–41)
Albumin: 2.7 g/dL — ABNORMAL LOW (ref 3.5–5.0)
Alkaline Phosphatase: 73 U/L (ref 38–126)
Anion gap: 10 (ref 5–15)
BUN: 22 mg/dL (ref 8–23)
CO2: 25 mmol/L (ref 22–32)
Calcium: 8.6 mg/dL — ABNORMAL LOW (ref 8.9–10.3)
Chloride: 99 mmol/L (ref 98–111)
Creatinine, Ser: 1.09 mg/dL (ref 0.61–1.24)
GFR, Estimated: >60 mL/min (ref >60)
Glucose, Bld: 211 mg/dL — ABNORMAL HIGH (ref 70–99)
Potassium: 4.1 mmol/L (ref 3.5–5.1)
Sodium: 134 mmol/L — ABNORMAL LOW (ref 135–145)
Total Bilirubin: 1.1 mg/dL (ref 0.3–1.2)
Total Protein: 5.5 g/dL — ABNORMAL LOW (ref 6.5–8.1)

## 2021-02-04 LAB — GLUCOSE, CAPILLARY
Glucose-Capillary: 149 mg/dL — ABNORMAL HIGH (ref 70–99)
Glucose-Capillary: 201 mg/dL — ABNORMAL HIGH (ref 70–99)
Glucose-Capillary: 237 mg/dL — ABNORMAL HIGH (ref 70–99)
Glucose-Capillary: 137 mg/dL — ABNORMAL HIGH (ref 70–99)

## 2021-02-04 NOTE — Progress Notes (Signed)
PROGRESS NOTE   Brandon [name]  UUV:[id number] DOB: [birth year] DOA: 02/01/2021 PCP: Pcp, No  Brief Narrative:  75 year old male from home-lives alone ambulates with walker History of CAD, thoracic artery aneurysm, COPD Gold stage III-IV on "4.5L" of oxygen 24/7 - with continued tobacco abuse\EtOH abuse, chronic pancreatitis, HTN, GERD, PAD.  Patient presents with severe dyspnea with exertion, ambulating only 3 or 4 steps before needing to break or rest.  Recently admitted 1/8 through 01/14/2021 with progressive shortness of breath-had heart cath secondary to NSTEMI--also has had previous admission 10/14 through 10/21/2020 unilateral weakness found to have acute stroke on imaging-patient was placed on Plavix and Pletal.   Admitted with acute hypoxic respiratory failure in the setting of likely COPD exacerbation -patient's hypoxia has resolved quite quickly (admitted on BiPAP) back on his home oxygen at rest however continues to desat quite profoundly with exertion.  Minimally elevated troponin in the setting of hypoxia, given recent cardiac history cardiology evaluating and following, no acute intervention/ischemic evaluation indicated.  Unlikely heart failure exacerbation due to euvolemia.  Pending clinical course over the next 24 to 48 hours would certainly be reasonable to discharge patient back home on home oxygen if able to ambulate without severe hypoxia or profound dyspnea.  Assessment & Plan:   Active Problems:   Essential hypertension   Type 2 diabetes mellitus (HCC)   CAD (coronary artery disease)   Acute on chronic respiratory failure with hypoxemia (HCC)   COPD with acute exacerbation (HCC)   COPD exacerbation (HCC)   (HFpEF) heart failure with preserved ejection fraction (HCC)  Acute hypoxic respiratory failure superimposed on chronic hypoxia due to acute COPD exacerbation (4.5L [place])  -Initially requiring BiPAP at intake but now able to be weaned down markedly aggressively over the  past 24 hours with steroids and nebulizers SpO2: 93 % O2 Flow Rate (L/min): 4.5 L/min FiO2 (%): 50 % Continue Breo Ellipta daily, albuterol every 2h as needed Discontinue antibiotics given reassuring chest x-ray and labs Patient continues to be markedly dyspneic with even minimal exertion, will likely need another 24-48 hours of steroids nebulizers and close monitoring at minimum  Recent NSTEMI status post stent placement, without chest pain History of heart failure: HFpEF, not in acute exacerbation Paroxysmal A. fib, sinus rhythm High-sensitivity troponins minimally elevated in the 200s secondary to acute hypoxia as above Cardiology following, no indication for ischemic evaluation Continue bisoprolol 5 daily Plavix 75 daily Transition back to p.o. Lasix per cardiology  Insulin-dependent diabetes type 2 Reduce 70/30 insulin to 10 units twice daily Lab Results  Component Value Date   HGBA1C 6.4 (H) 01/07/2021  Hold home p.o. medications, will resume at discharge  HTN, essential, well controlled Continue bisoprolol, cardizem  HLD Hypothyroid Prior EtOH abuse with pancreatitis  DVT prophylaxis: Lovenox Code Status: Full Family Communication: Daughter Mardene Celeste [phone] updated on telephone Disposition:  Status is: Inpatient  Remains inpatient appropriate because:Persistent severe electrolyte disturbances, Ongoing active pain requiring inpatient pain management, IV treatments appropriate due to intensity of illness or inability to take PO and Inpatient level of care appropriate due to severity of illness   Dispo: The patient is from: Home              Anticipated d/c is to: Home              Anticipated d/c date is:24 to 48 hours              Patient currently is not medically stable to d/c.  Given profound dyspnea with exertion  Consultants:   Cardiology  Procedures: None yet  Antimicrobials:  Doxycycline, discontinued   Subjective: No acute issues or events  overnight, patient feels remarkably better than yesterday, requesting increased ambulation today which is certainly reasonable given cardiology's reassuring evaluation.  At this time denies chest pain, nausea, vomiting, diarrhea, constipation, headache, fevers, chills.  Dyspnea at rest is resolved but dyspnea with exertion ongoing but improving, not yet back to baseline.  Objective: Vitals:   02/04/21 0334 02/04/21 0400 02/04/21 0500 02/04/21 0741  BP: 121/70 114/80 120/67 120/71  Pulse: (!) 103 89 92 99  Resp: 16 15 15 20   Temp: 98.2 F (36.8 C)   98.2 F (36.8 C)  TempSrc: Oral   Oral  SpO2: 95% 98% 94% 93%  Weight:        Intake/Output Summary (Last 24 hours) at 02/04/2021 0804 Last data filed at 02/03/2021 2314 Gross per 24 hour  Intake -  Output 1800 ml  Net -1800 ml   Filed Weights   02/02/21 0818  Weight: 80.5 kg    Examination:  General:  Pleasantly resting in bed, No acute distress. HEENT:  Normocephalic atraumatic.  Sclerae nonicteric, noninjected.  Extraocular movements intact bilaterally. Neck:  Without mass or deformity.  Trachea is midline. Lungs: Diminished breath sounds bilaterally without overt rhonchi, wheeze, or rales. Heart:  Regular rate and rhythm.  Without murmurs, rubs, or gallops. Abdomen:  Soft, nontender, nondistended.  Without guarding or rebound. Extremities: Without cyanosis, clubbing, edema, or obvious deformity. Vascular:  Dorsalis pedis and posterior tibial pulses palpable bilaterally. Skin:  Warm and dry, no erythema, no ulcerations.  Data Reviewed: I have personally reviewed following labs and imaging studies  COVID-19 Labs  Recent Labs    02/01/21 2205  DDIMER 1.30*    Lab Results  Component Value Date   [place] NEGATIVE 01/07/2021   [place] NEGATIVE 10/21/2020   [place] NEGATIVE 10/13/2020     Radiology Studies: No results found.   Scheduled Meds: . aspirin EC  325 mg Oral Daily  . atorvastatin  40 mg Oral  QHS  . bisoprolol  5 mg Oral Daily  . clopidogrel  75 mg Oral Daily  . diltiazem  240 mg Oral Daily  . enoxaparin (LOVENOX) injection  40 mg Subcutaneous Q24H  . feeding supplement (GLUCERNA SHAKE)  237 mL Oral BID BM  . ferrous sulfate  325 mg Oral BID WC  . fluticasone furoate-vilanterol  1 puff Inhalation Daily  . furosemide  40 mg Oral Daily  . insulin aspart  0-15 Units Subcutaneous TID WC  . insulin aspart  0-5 Units Subcutaneous QHS  . insulin aspart protamine- aspart  10 Units Subcutaneous BID WC  . magnesium oxide  400 mg Oral Daily  . oxybutynin  10 mg Oral Daily  . Ensure Max Protein  11 oz Oral QHS  . sodium chloride flush  3 mL Intravenous Q12H  . umeclidinium bromide  1 puff Inhalation Daily   Continuous Infusions: . sodium chloride       LOS: 3 days    Time spent: [place], DO Pager: Secure chat  7 PM to 7 AM please see AMION for coverage   02/04/2021, 8:04 AM

## 2021-02-05 DIAGNOSIS — J9621 Acute and chronic respiratory failure with hypoxia: Secondary | ICD-10-CM

## 2021-02-05 LAB — CBC WITH DIFFERENTIAL/PLATELET
Abs Immature Granulocytes: 0.1 10*3/uL — ABNORMAL HIGH (ref 0.00–0.07)
Basophils Absolute: 0 10*3/uL (ref 0.0–0.1)
Basophils Relative: 0 %
Eosinophils Absolute: 0.1 10*3/uL (ref 0.0–0.5)
Eosinophils Relative: 1 %
HCT: 33.7 % — ABNORMAL LOW (ref 39.0–52.0)
Hemoglobin: 10.2 g/dL — ABNORMAL LOW (ref 13.0–17.0)
Immature Granulocytes: 1 %
Lymphocytes Relative: 18 %
Lymphs Abs: 1.8 10*3/uL (ref 0.7–4.0)
MCH: 23.5 pg — ABNORMAL LOW (ref 26.0–34.0)
MCHC: 30.3 g/dL (ref 30.0–36.0)
MCV: 77.6 fL — ABNORMAL LOW (ref 80.0–100.0)
Monocytes Absolute: 1.1 10*3/uL — ABNORMAL HIGH (ref 0.1–1.0)
Monocytes Relative: 11 %
Neutro Abs: 7.1 10*3/uL (ref 1.7–7.7)
Neutrophils Relative %: 69 %
Platelets: 375 10*3/uL (ref 150–400)
RBC: 4.34 MIL/uL (ref 4.22–5.81)
RDW: 21.2 % — ABNORMAL HIGH (ref 11.5–15.5)
WBC: 10.2 10*3/uL (ref 4.0–10.5)
nRBC: 0.2 % (ref 0.0–0.2)

## 2021-02-05 LAB — COMPREHENSIVE METABOLIC PANEL
ALT: 16 U/L (ref 0–44)
AST: 19 U/L (ref 15–41)
Albumin: 2.6 g/dL — ABNORMAL LOW (ref 3.5–5.0)
Alkaline Phosphatase: 61 U/L (ref 38–126)
Anion gap: 12 (ref 5–15)
BUN: 17 mg/dL (ref 8–23)
CO2: 24 mmol/L (ref 22–32)
Calcium: 8.4 mg/dL — ABNORMAL LOW (ref 8.9–10.3)
Chloride: 99 mmol/L (ref 98–111)
Creatinine, Ser: 0.96 mg/dL (ref 0.61–1.24)
GFR, Estimated: >60 mL/min (ref >60)
Glucose, Bld: 175 mg/dL — ABNORMAL HIGH (ref 70–99)
Potassium: 3.5 mmol/L (ref 3.5–5.1)
Sodium: 135 mmol/L (ref 135–145)
Total Bilirubin: 0.6 mg/dL (ref 0.3–1.2)
Total Protein: 5 g/dL — ABNORMAL LOW (ref 6.5–8.1)

## 2021-02-05 LAB — GLUCOSE, CAPILLARY
Glucose-Capillary: 143 mg/dL — ABNORMAL HIGH (ref 70–99)
Glucose-Capillary: 368 mg/dL — ABNORMAL HIGH (ref 70–99)
Glucose-Capillary: 235 mg/dL — ABNORMAL HIGH (ref 70–99)
Glucose-Capillary: 160 mg/dL — ABNORMAL HIGH (ref 70–99)

## 2021-02-05 MED ORDER — SODIUM CHLORIDE 0.9 % IV SOLN
510.0000 mg | Freq: Once | INTRAVENOUS | Status: AC
  Administered 2021-02-05: 510 mg via INTRAVENOUS
  Filled 2021-02-05: qty 17

## 2021-02-05 NOTE — Progress Notes (Signed)
Patient was lost balance at the first walk, but second ambulation was more balance with walker. SATURATION QUALIFICATIONS: patient is wearing O2 4.5L at home. Patient Saturations on 4.5 Liters of oxygen while resting = 93% Oxygen tank only goes up to 4 to 6 Liters. Patient Saturations on 6 Liters of oxygen while ambulating = 81% Patient needs higher Oxygen. Patient denied SOB and tolerable. HS [employer]

## 2021-02-05 NOTE — Progress Notes (Signed)
[facility] Progress Note  Patient: Brandon [name]    [id number]  DOA: 02/01/2021     Date of Service: the patient was seen and examined on 02/05/2021  Brief hospital course: Past medical history of CAD, PCI in Jan 2022, COPD, type II DM, HTN, PVD.  Presents with complaints of progressively worsening shortness of breath.  Found to have COPD exacerbation as well as acute on chronic diastolic CHF. Currently plan is continue diuresis and supportive care for hypoxia, monitor for improvement on exertional oxygenation.  Assessment and Plan: 1.  Acute on chronic hypoxic respiratory failure Acute COPD exacerbation On 4.5 LPM [place] at home. Initially requiring BiPAP on admission. Improved rapidly with diuresis and steroids and nebulizer therapy. Currently on 4.5 L at rest.  But on exertion drops below 88 on 6 LPM. COVID-19 test negative. Initially started on IV antibiotics which is currently discontinued. Continue incentive spirometry. Monitor for improvement in oxygenation on exertion.  2.  Acute on chronic diastolic CHF Initially treated with IV Lasix. Currently improving significantly. Cardiology currently signed off. On 40 mg of Lasix p.o. Outpatient follow-up in 1 week with a BMP with cardiology recommended  3.  Elevated troponins Recent non-STEMI with CAD requiring PCI Likely demand ischemia in the setting of hypoxia. No evidence of recurrent myocardial injury. Continue bisoprolol, aspirin, Plavix. No further ischemic evaluation.  4.  HLD Continue statin  5.  HTN Blood pressure stable.  Continue current regimen.  6.  History of paroxysmal atrial tachycardia No A. fib. Currently on rate control medication. Monitor. No anticoagulation per cardiology.  7.  Type II DM, insulin-dependent with HLD.  Uncontrolled Hemoglobin A1c 6.4. Currently blood sugar slightly elevated. Holding Metformin. On sliding scale insulin.  8.  Overactive bladder. Continue Ditropan.  9.   Iron deficiency anemia. Hemoglobin stable around 10. Iron levels are significantly low. Patient did not receive any IV iron therapy during last admission. Patient does not take oral iron therapy as he has chronic constipation. We will provide IV iron right now. Outpatient referral to hematology for IV iron therapy. No bleeding reported by the patient.  Body mass index is 20.51 kg/m.   Diet: Cardiac diet DVT Prophylaxis:   enoxaparin (LOVENOX) injection 40 mg Start: 02/01/21 2245    Advance goals of care discussion: Full code  Family Communication: no family was present at bedside, at the time of interview.   Disposition:  Status is: Inpatient  Remains inpatient appropriate because: Need for oxygen on exertion more than what can be provided outside of the hospital.  Dispo: The patient is from: Home              Anticipated d/c is to: Home              Anticipated d/c date is: 2 days              Patient currently is not medically stable to d/c.   Difficult to place patient         Subjective: Continues to have shortness of breath continues to have cough.  No nausea no vomiting.  Thinks that he needs to be on chronic Lasix.  Physical Exam:  General: Appear in moderate distress, no Rash; Oral Mucosa Clear, moist. no Abnormal Neck Mass Or lumps, Conjunctiva normal  Cardiovascular: S1 and S2 Present, no Murmur, Respiratory: Normal respiratory effort, Bilateral Air entry present and bilateral  Crackles, no wheezes Abdomen: Bowel Sound present, Soft and no tenderness Extremities: trace Pedal edema Neurology:  alert and oriented to time, place, and person affect appropriate. no new focal deficit Gait not checked due to patient safety concerns    Vitals:   02/05/21 0300 02/05/21 0700 02/05/21 0829 02/05/21 1148  BP: 140/82 124/83  118/74  Pulse: 87 99    Resp: (!) 22 18  20   Temp: 98 F (36.7 C) 98.4 F (36.9 C)  97.6 F (36.4 C)  TempSrc: Oral Oral  Oral  SpO2: 99%  93% 93% 90%  Weight:        Intake/Output Summary (Last 24 hours) at 02/05/2021 1529 Last data filed at 02/05/2021 1410 Gross per 24 hour  Intake 473 ml  Output 2100 ml  Net -1627 ml   Filed Weights   02/02/21 0818  Weight: 80.5 kg    Data Reviewed: I have personally reviewed and interpreted daily labs, tele strips, imaging. I reviewed all nursing notes, pharmacy notes, vitals, pertinent old records I have discussed plan of care as described above with RN and patient/family.  CBC: Recent Labs  Lab 02/01/21 1846 02/01/21 2234 02/01/21 2304 02/02/21 0532 02/02/21 0821 02/03/21 0641 02/04/21 0050 02/05/21 0151  WBC 10.8* 8.5  --  6.6  --  15.5* 13.0* 10.2  NEUTROABS 10.4*  --   --   --   --  14.1* 10.9* 7.1  HGB 10.3* 9.6*   < > 10.0* 10.5* 10.2* 10.1* 10.2*  HCT 35.0* 31.4*   < > 32.1* 31.0* 33.3* 31.7* 33.7*  MCV 80.1 79.1*  --  78.5*  --  78.2* 76.6* 77.6*  PLT 397 361  --  357  --  410* 390 375   < > = values in this interval not displayed.   Basic Metabolic Panel: Recent Labs  Lab 02/01/21 1846 02/01/21 2234 02/01/21 2304 02/02/21 0532 02/02/21 0821 02/03/21 0641 02/04/21 0050 02/05/21 0151  NA 134*  --    < > 133* 134* 136 134* 135  K 5.2*  --    < > 4.5 4.3 4.2 4.1 3.5  CL 104  --   --  101  --  101 99 99  CO2 17*  --   --  19*  --  24 25 24   GLUCOSE 221*  --   --  270*  --  185* 211* 175*  BUN 16  --   --  14  --  15 22 17   CREATININE 1.18 1.03  --  1.09  --  1.06 1.09 0.96  CALCIUM 9.1  --   --  8.6*  --  8.9 8.6* 8.4*   < > = values in this interval not displayed.    Studies: No results found.  Scheduled Meds: . aspirin EC  325 mg Oral Daily  . atorvastatin  40 mg Oral QHS  . bisoprolol  5 mg Oral Daily  . clopidogrel  75 mg Oral Daily  . diltiazem  240 mg Oral Daily  . enoxaparin (LOVENOX) injection  40 mg Subcutaneous Q24H  . feeding supplement (GLUCERNA SHAKE)  237 mL Oral BID BM  . fluticasone furoate-vilanterol  1 puff Inhalation Daily   . furosemide  40 mg Oral Daily  . insulin aspart  0-15 Units Subcutaneous TID WC  . insulin aspart  0-5 Units Subcutaneous QHS  . insulin aspart protamine- aspart  10 Units Subcutaneous BID WC  . magnesium oxide  400 mg Oral Daily  . oxybutynin  10 mg Oral Daily  . Ensure Max Protein  11 oz Oral  QHS  . sodium chloride flush  3 mL Intravenous Q12H  . umeclidinium bromide  1 puff Inhalation Daily   Continuous Infusions: . sodium chloride     PRN Meds: sodium chloride, acetaminophen **OR** acetaminophen, ipratropium-albuterol, ondansetron **OR** ondansetron (ZOFRAN) IV, senna-docusate, sodium chloride flush  Time spent: 35 minutes  Author: Berle Mull, MD [facility] 02/05/2021 3:29 PM  To reach On-call, see care teams to locate the attending and reach out via [email or web address]. Between 7PM-7AM, please contact night-coverage If you still have difficulty reaching the attending provider, please page the [facility] (Director on Call) for [facility] on amion for assistance.

## 2021-02-06 LAB — GLUCOSE, CAPILLARY
Glucose-Capillary: 140 mg/dL — ABNORMAL HIGH (ref 70–99)
Glucose-Capillary: 252 mg/dL — ABNORMAL HIGH (ref 70–99)

## 2021-02-06 MED ORDER — INSULIN ASPART PROT & ASPART (70-30 MIX) 100 UNIT/ML ~~LOC~~ SUSP
20.0000 [IU] | Freq: Two times a day (BID) | SUBCUTANEOUS | Status: DC
  Filled 2021-02-06: qty 10

## 2021-02-06 MED ORDER — FUROSEMIDE 40 MG PO TABS: 40.0000 mg | ORAL_TABLET | Freq: Every day | ORAL | 0 refills | Status: DC

## 2021-02-06 NOTE — Progress Notes (Addendum)
SATURATION QUALIFICATIONS: (This note is used to comply with regulatory documentation for home oxygen)    Patient Saturations on 4.5 Liter - Pt's baseline is 95% at rest.  Patient Saturations on 4 Liters of oxygen while Ambulating = 81%  Patient Saturation on 6 liters of oxygen while ambulating = 94%  Please briefly explain why patient needs home oxygen: Pt has increase demand of Oxygen when ambulating.

## 2021-02-06 NOTE — Evaluation (Signed)
Physical Therapy Evaluation Patient Details Name: Brandon [name] MRN: [id number] DOB: 04-08-1946 Today's Date: 02/06/2021   History of Present Illness  Pt is a 75 y.o. M with significant PMH of CAD, PCI, COPD , type II DM, HTN, PVD who presents with COPD exacerbation and acute on chronic diastolic CHF.  Clinical Impression  Prior to admission, pt lives alone, uses a walker for mobility and requires assist for shower transfers and IADL's. Pt presents with decreased cardiopulmonary endurance. Ambulating x 160 feet with a walker at a min guard assist level; SpO2 88-92% on 6L O2. Discussed "bumping," oxygen level up when mobilizing and monitoring oxygen saturations at home. Pt would benefit from follow up HHPT to maximize functional independence.     Follow Up Recommendations Home health PT;Supervision for mobility/OOB    Equipment Recommendations  None recommended by PT    Recommendations for Other Services       Precautions / Restrictions Precautions Precautions: Fall;Other (comment) Precaution Comments: watch O2 Restrictions Weight Bearing Restrictions: No      Mobility  Bed Mobility               General bed mobility comments: OOB in chair    Transfers Overall transfer level: Needs assistance Equipment used: Rolling walker (2 wheeled) Transfers: Sit to/from Stand Sit to Stand: Supervision            Ambulation/Gait Ambulation/Gait assistance: Min guard Gait Distance (Feet): 160 Feet Assistive device: Rolling walker (2 wheeled) Gait Pattern/deviations: Step-through pattern;Decreased stride length;Trunk flexed Gait velocity: decreased   General Gait Details: Min guard for safety, cues for activity pacing  Stairs            Wheelchair Mobility    Modified Rankin (Stroke Patients Only)       Balance Overall balance assessment: Needs assistance Sitting-balance support: Feet supported Sitting balance-Leahy Scale: Good     Standing balance  support: Bilateral upper extremity supported;Single extremity supported Standing balance-Leahy Scale: Fair                               Pertinent Vitals/Pain Pain Assessment: Faces Faces Pain Scale: No hurt    Home Living Family/patient expects to be discharged to:: Private residence Living Arrangements: Alone Available Help at Discharge: Family;Available PRN/intermittently Type of Home: House Home Access: Stairs to enter Entrance Stairs-Rails: Right;Left;Can reach both Entrance Stairs-Number of Steps: 3 Home Layout: One level Home Equipment: Grab bars - tub/shower;Shower seat;Cane - single point;Walker - 2 wheels;Walker - 4 wheels      Prior Function Level of Independence: Needs assistance   Gait / Transfers Assistance Needed: using walker in past week PTA, denies falls  ADL's / Homemaking Assistance Needed: requires assist for shower transfers. daughter visits every week and assists with IADL's        Hand Dominance   Dominant Hand: Right    Extremity/Trunk Assessment   Upper Extremity Assessment Upper Extremity Assessment: Overall WFL for tasks assessed    Lower Extremity Assessment Lower Extremity Assessment: Overall WFL for tasks assessed    Cervical / Trunk Assessment Cervical / Trunk Assessment: Kyphotic  Communication   Communication: HOH  Cognition Arousal/Alertness: Awake/alert Behavior During Therapy: WFL for tasks assessed/performed Overall Cognitive Status: Within Functional Limits for tasks assessed  General Comments      Exercises     Assessment/Plan    PT Assessment Patient needs continued PT services  PT Problem List Decreased balance;Decreased coordination;Decreased safety awareness       PT Treatment Interventions DME instruction;Gait training;Stair training;Functional mobility training;Therapeutic activities;Therapeutic exercise;Balance training;Neuromuscular  re-education;Patient/family education    PT Goals (Current goals can be found in the Care Plan section)  Acute Rehab PT Goals Patient Stated Goal: return home PT Goal Formulation: With patient Time For Goal Achievement: 02/20/21 Potential to Achieve Goals: Good    Frequency Min 3X/week   Barriers to discharge        Co-evaluation               AM-PAC PT "6 Clicks" Mobility  Outcome Measure Help needed turning from your back to your side while in a flat bed without using bedrails?: None Help needed moving from lying on your back to sitting on the side of a flat bed without using bedrails?: None Help needed moving to and from a bed to a chair (including a wheelchair)?: None Help needed standing up from a chair using your arms (e.g., wheelchair or bedside chair)?: None Help needed to walk in hospital room?: A Little Help needed climbing 3-5 steps with a railing? : A Little 6 Click Score: 22    End of Session Equipment Utilized During Treatment: Gait belt;Oxygen Activity Tolerance: Patient tolerated treatment well Patient left: in chair;with call bell/phone within reach Nurse Communication: Mobility status PT Visit Diagnosis: Unsteadiness on feet (R26.81);Difficulty in walking, not elsewhere classified (R26.2)    Time: 2353-6144 PT Time Calculation (min) (ACUTE ONLY): 21 min   Charges:   PT Evaluation $PT Eval Moderate Complexity: 1 Mod          Wyona Almas, PT, DPT Acute Rehabilitation Services Pager [phone] Office [phone]   Deno Etienne 02/06/2021, 4:36 PM

## 2021-02-06 NOTE — TOC Initial Note (Signed)
Transition of Care [facility]) - Initial/Assessment Note    Patient Details  Name: Brandon [name] MRN: [id number] Date of Birth: [birth year]  Transition of Care [facility]) CM/SW Contact:    Zenon Mayo, RN Phone Number: 02/06/2021, 1:15 PM  Clinical Narrative:                 Patient is for dc today, NCM spoke with daughter Mardene Celeste, she is on her way to trasport him home, she will bring a oxygen tank to pick him up.  NCM spoke with Abundio Miu at the [facility], she states to fax the new order to [phone] and to the PCP , Dr. Doyle Askew [phone].  NCM faxed the new orders.  Once daughter is here , he is good to be discharged.   Expected Discharge Plan: [place] Barriers to Discharge: No Barriers Identified   Patient Goals and CMS Choice Patient states their goals for this hospitalization and ongoing recovery are:: go home CMS Medicare.gov Compare Post Acute Care list provided to:: Patient Represenative (must comment)    Expected Discharge Plan and Services Expected Discharge Plan: [place] In-house Referral: NA Discharge Planning Services: CM Consult   Living arrangements for the past 2 months: Single Family Home Expected Discharge Date: 02/06/21                 DME Agency: NA       HH Arranged: RN,PT,OT [place] Agency: [place] ([facility]) Date [place]: 02/06/21 Time HH Agency Contacted: 20 Representative spoke with at [place]: Clarene Critchley  Prior Living Arrangements/Services Living arrangements for the past 2 months: [place] with:: Self Patient language and need for interpreter reviewed:: Yes Do you feel safe going back to the place where you live?: Yes      Need for Family Participation in Patient Care: Yes (Comment) Care giver support system in place?: Yes (comment) Current home services: DME (has rolling walker and home oxygen with [employer]) Criminal Activity/Legal  Involvement Pertinent to Current Situation/Hospitalization: No - Comment as needed  Activities of Daily Living      Permission Sought/Granted                  Emotional Assessment Appearance:: Appears stated age Attitude/Demeanor/Rapport: Engaged Affect (typically observed): Appropriate Orientation: : Oriented to Self,Oriented to Place,Oriented to  Time,Oriented to Situation Alcohol / Substance Use: Not Applicable Psych Involvement: No (comment)  Admission diagnosis:  Respiratory distress [R06.03] COPD exacerbation ([place]) [J44.1] Patient Active Problem List   Diagnosis Date Noted  . (HFpEF) heart failure with preserved ejection fraction ([place]) 02/01/2021  . Pressure injury of skin 01/09/2021  . Malnutrition of moderate degree 01/09/2021  . Elevated troponin 01/07/2021  . COPD with acute exacerbation ([place]) 01/07/2021  . History of CVA (cerebrovascular accident) 01/07/2021  . COPD exacerbation ([place]) 01/07/2021  . Hypomagnesemia 01/07/2021  . Essential hypertension   . Type 2 diabetes mellitus ([place])   . CAD (coronary artery disease)   . GERD (gastroesophageal reflux disease)   . Acute on chronic respiratory failure with hypoxemia ([place])   . COPD (chronic obstructive pulmonary disease) ([place])   . Tobacco abuse   . Hyponatremia   . Hyperkalemia   . Ischemic stroke [facility])    PCP:  Pcp, No Pharmacy:   [place], [place] - [address] [place] [address] Phone: [phone] Fax:  [place], [address] [place] [address] Phone: [phone] Fax: [phone]     Social Determinants of Health (SDOH) Interventions    Readmission Risk Interventions Readmission Risk Prevention Plan 02/06/2021  Transportation Screening Complete  PCP or Specialist Appt within 3-5  Days Complete  HRI or [place] Complete  Social Work Consult for [place] Planning/Counseling Complete  Palliative Care Screening Not Applicable  Medication Review Press photographer) Complete  Some recent data might be hidden

## 2021-02-06 NOTE — TOC Transition Note (Signed)
Transition of Care [facility]) - CM/SW Discharge Note   Patient Details  Name: Brandon [name] MRN: [id number] Date of Birth: [birth year]  Transition of Care [facility]) CM/SW Contact:  Zenon Mayo, RN Phone Number: 02/06/2021, 1:26 PM   Clinical Narrative:    Patient is for dc today, NCM spoke with daughter Mardene Celeste, she is on her way to trasport him home, she will bring a oxygen tank to pick him up.  NCM spoke with Abundio Miu at the [facility], she states to fax the new order to [phone] and to the PCP , Dr. Doyle Askew [phone].  NCM faxed the new orders.  Once daughter is here , he is good to be discharged.   Final next level of care: [place] Barriers to Discharge: No Barriers Identified   Patient Goals and CMS Choice Patient states their goals for this hospitalization and ongoing recovery are:: go home CMS Medicare.gov Compare Post Acute Care list provided to:: Patient Represenative (must comment)    Discharge Placement                       Discharge Plan and Services In-house Referral: NA Discharge Planning Services: CM Consult              DME Agency: NA       HH Arranged: RN,PT,OT [place] Agency: [place] ([facility]) Date [place]: 02/06/21 Time [place]: 6967 Representative spoke with at [place]: [place] ([place]) Interventions     Readmission Risk Interventions Readmission Risk Prevention Plan 02/06/2021  Transportation Screening Complete  PCP or Specialist Appt within 3-5 Days Complete  HRI or [place] Complete  Social Work Consult for [place] Planning/Counseling Complete  Palliative Care Screening Not Applicable  Medication Review Press photographer) Complete  Some recent data might be hidden

## 2021-02-06 NOTE — Plan of Care (Signed)

## 2021-02-06 NOTE — Progress Notes (Signed)
Inpatient Diabetes Program Recommendations  AACE/ADA: New Consensus Statement on Inpatient Glycemic Control (2015)  Target Ranges:  Prepandial:   less than 140 mg/dL      Peak postprandial:   less than 180 mg/dL (1-2 hours)      Critically ill patients:  140 - 180 mg/dL   Lab Results  Component Value Date   GLUCAP 140 (H) 02/06/2021   HGBA1C 6.4 (H) 01/07/2021    Review of Glycemic Control Results for KAYNEN, Brandon [name] (MRN [id number]) as of 02/06/2021 09:22  Ref. Range 02/05/2021 06:23 02/05/2021 11:44 02/05/2021 15:49 02/05/2021 21:00 02/06/2021 06:17  Glucose-Capillary Latest Ref Range: 70 - 99 mg/dL 143 (H) 368 (H) 235 (H) 160 (H) 140 (H)   Diabetes history: DM 2 Outpatient Diabetes medications:  Novolog 70/30 20 units bid, Metfromin 1000 mg bid Current orders for Inpatient glycemic control:  Novolog 70/30 10 units bid Novolog moderate tid with meals and HS  Inpatient Diabetes Program Recommendations:    Please increase 70/30 to 20 units q AM and 15 units q PM.    Thanks,  Adah Perl, RN, BC-ADM Inpatient Diabetes Coordinator Pager [phone] (8a-5p)

## 2021-02-06 NOTE — Discharge Instructions (Signed)
Radial Site Care  This sheet gives you information about how to care for yourself after your procedure. Your health care provider may also give you more specific instructions. If you have problems or questions, contact your health care provider. What can I expect after the procedure? After the procedure, it is common to have:  Bruising and tenderness at the catheter insertion area. Follow these instructions at home: Medicines  Take over-the-counter and prescription medicines only as told by your health care provider. Insertion site care  Follow instructions from your health care provider about how to take care of your insertion site. Make sure you: ? Wash your hands with soap and water before you change your bandage (dressing). If soap and water are not available, use hand sanitizer. ? Change your dressing as told by your health care provider. ? Leave stitches (sutures), skin glue, or adhesive strips in place. These skin closures may need to stay in place for 2 weeks or longer. If adhesive strip edges start to loosen and curl up, you may trim the loose edges. Do not remove adhesive strips completely unless your health care provider tells you to do that.  Check your insertion site every day for signs of infection. Check for: ? Redness, swelling, or pain. ? Fluid or blood. ? Pus or a bad smell. ? Warmth.  Do not take baths, swim, or use a hot tub until your health care provider approves.  You may shower 24-48 hours after the procedure, or as directed by your health care provider. ? Remove the dressing and gently wash the site with plain soap and water. ? Pat the area dry with a clean towel. ? Do not rub the site. That could cause bleeding.  Do not apply powder or lotion to the site. Activity  For 24 hours after the procedure, or as directed by your health care provider: ? Do not flex or bend the affected arm. ? Do not push or pull heavy objects with the affected arm. ? Do not drive  yourself home from the hospital or clinic. You may drive 24 hours after the procedure unless your health care provider tells you not to. ? Do not operate machinery or power tools.  Do not lift anything that is heavier than 10 lb (4.5 kg), or the limit that you are told, until your health care provider says that it is safe.  Ask your health care provider when it is okay to: ? Return to work or school. ? Resume usual physical activities or sports. ? Resume sexual activity.   General instructions  If the catheter site starts to bleed, raise your arm and put firm pressure on the site. If the bleeding does not stop, get help right away. This is a medical emergency.  If you went home on the same day as your procedure, a responsible adult should be with you for the first 24 hours after you arrive home.  Keep all follow-up visits as told by your health care provider. This is important. Contact a health care provider if:  You have a fever.  You have redness, swelling, or yellow drainage around your insertion site. Get help right away if:  You have unusual pain at the radial site.  The catheter insertion area swells very fast.  The insertion area is bleeding, and the bleeding does not stop when you hold steady pressure on the area.  Your arm or hand becomes pale, cool, tingly, or numb. These symptoms may represent a serious   problem that is an emergency. Do not wait to see if the symptoms will go away. Get medical help right away. Call your local emergency services (911 in the [place] Do not drive yourself to the hospital. Summary  After the procedure, it is common to have bruising and tenderness at the site.  Follow instructions from your health care provider about how to take care of your radial site wound. Check the wound every day for signs of infection.  Do not lift anything that is heavier than 10 lb (4.5 kg), or the limit that you are told, until your health care provider says that it  is safe. This information is not intended to replace advice given to you by your health care provider. Make sure you discuss any questions you have with your health care provider. Document Revised: 01/22/2018 Document Reviewed: 01/22/2018 Elsevier Patient Education  2021 Elsevier Inc.  

## 2021-02-07 NOTE — TOC Progression Note (Signed)
Transition of Care [facility]) - Progression Note    Patient Details  Name: Kaidence Callaway MRN: [id number] Date of Birth: [birth year]  Transition of Care [facility]) CM/SW Contact  Zenon Mayo, RN Phone Number: 02/07/2021, 10:29 AM  Clinical Narrative:    NCM received call from [place] from [place] (oxygen) , she states he has concentrator at home that goes up to 10 liters, he was on 3 liters at rest and 8 liters with exertion from  Previous order,  so he should be ok  With what he already has.  She did not need a new order.  She states she will check on him to make sure he has all the DME he needs concerning the oxygen.    Expected Discharge Plan: [place] Barriers to Discharge: No Barriers Identified  Expected Discharge Plan and Services Expected Discharge Plan: [place] In-house Referral: NA Discharge Planning Services: CM Consult   Living arrangements for the past 2 months: Single Family Home Expected Discharge Date: 02/06/21                 DME Agency: NA       HH Arranged: RN,PT,OT [place] Agency: [place] ([facility]) Date [place]: 02/06/21 Time [place]: 9211 Representative spoke with at [place]: [place] ([place]) Interventions    Readmission Risk Interventions Readmission Risk Prevention Plan 02/06/2021  Transportation Screening Complete  PCP or Specialist Appt within 3-5 Days Complete  HRI or [place] Complete  Social Work Consult for [place] Planning/Counseling Complete  Palliative Care Screening Not Applicable  Medication Review Press photographer) Complete  Some recent data might be hidden

## 2021-02-07 NOTE — Discharge Summary (Signed)
[facility] Discharge Summary   Patient: Brandon [name] IRJ:[id number]  PCP: Pcp, No  Date of admission: 02/01/2021   Date of discharge: 02/06/2021     Discharge Diagnoses:  Principal diagnosis COPD exacerbation   Active Problems:   Essential hypertension   Type 2 diabetes mellitus ([place])   CAD (coronary artery disease)   Acute on chronic respiratory failure with hypoxemia ([place])   COPD with acute exacerbation (HCC)   COPD exacerbation ([place])   (HFpEF) heart failure with preserved ejection fraction ([place])   Admitted From: home Disposition:  Home with home health  Recommendations for Outpatient Follow-up:  1. PCP: follow up with PCP in 1 week. Need Outpatient referral to hematology for IV iron therapy. 2. Benefit from follow up with pulmonary with nebulizer therapy device for PRN use at home 3. Follow up with Cardiology  4. Follow up LABS/TEST:  Iron studies   Follow-up Information    PCP at [facility] Schedule an appointment as soon as possible for a visit in 1 week(s).   Why: Repeat BMP in 1 week.       Cardiology at [facility]. Schedule an appointment as soon as possible for a visit in 1 month(s).   Why: Monitor need for diuresis       Pulmonary at [facility]. Schedule an appointment as soon as possible for a visit in 1 month(s).   Why: Perform PFT to monitor progression of patient's COPD.  Discussed about nebulizer therapy       Health, [facility]-Home Follow up.   Specialty: Home Health Services Why: HHPT, [place], [facility]             Discharge Instructions    Diet - low sodium heart healthy   Complete by: As directed    Increase activity slowly   Complete by: As directed    No wound care   Complete by: As directed       Diet recommendation: Cardiac diet  Activity: The patient is advised to gradually reintroduce usual activities, as tolerated  Discharge Condition: stable  Code Status: Full code   History of present illness: As per the H and P dictated on  admission, "Brandon [name] is a 75 y.o. male with medical history significant for CAD, recent MI with stent placement in Jan. 2022, COPD, DM, HTN, PVD who presents by EMS with complaint of SOB worsening over the past few days. Uses oxygen by n/c at home daily. Has needed increased oxygen past two days at home. Patient was hypoxic on his typical home oxygen at 78% prior to EMS transport per report. He was given Atrovent, albuterol, magnesium and Solu-Medrol by EMS. He was placed on Bipap in ER.  The emergency room he was found to have tachypnea and increased work of breathing and was placed on BiPAP which she is tolerating well.  Chest x-ray shows some pulmonary edema versus atypical diffuse infiltrates.  He was given Lasix in the emergency room.  BNP was mildly elevated at 136.  Not obtained in the emergency room.  D-dimer and procalcitonin were also obtained in the emergency room but have not been ordered.  Over the last few days he has had worsening shortness of breath that is exacerbated by activity and walking.  He states he can only walk 2-3 steps due to his severe shortness of breath over the last 24 hours at home which is not normal for him.  States he has not had any fever or chills.  He denies any  abdominal pain nausea vomiting or diarrhea.  He has not had any change in diet and he has no known sick contacts at home.  He lives by himself but does have home health is right now.  Patient had echocardiogram last month which is reviewed.  Patient had mildly decreased diastolic function but normal EF. "  Hospital Course:  Summary of his active problems in the hospital is as following. 1.  Acute on chronic hypoxic respiratory failure Acute COPD exacerbation On 4.5 LPM [place] at home. Initially requiring BiPAP on admission. Improved rapidly with diuresis and steroids and nebulizer therapy. Currently on 4.5 L at rest.  But on exertion drops below 88 on 6 LPM. COVID-19 test negative. Initially started on IV  antibiotics which is currently discontinued. Continue incentive spirometry.  2.  Acute on chronic diastolic CHF Initially treated with IV Lasix. Currently improving significantly. Cardiology currently signed off. On 40 mg of Lasix p.o. Outpatient follow-up in 1 week with a BMP with cardiology recommended  3.  Elevated troponins Recent non-STEMI with CAD requiring PCI Likely demand ischemia in the setting of hypoxia. No evidence of recurrent myocardial injury. Continue bisoprolol, aspirin, Plavix. No further ischemic evaluation.  4.  HLD Continue statin  5.  HTN Blood pressure stable.  Continue current regimen.  6.  History of paroxysmal atrial tachycardia No A. fib. Currently on rate control medication. Monitor. No anticoagulation per cardiology.  7.  Type II DM, insulin-dependent with HLD.  Uncontrolled Hemoglobin A1c 6.4. Currently blood sugar slightly elevated. Holding Metformin.  8.  Overactive bladder. Continue Ditropan.  9.  Iron deficiency anemia. Hemoglobin stable around 10. Iron levels are significantly low. Patient did not receive any IV iron therapy during last admission. Patient does not take oral iron therapy as he has chronic constipation. Pt was given IV iron. Outpatient referral to hematology for IV iron therapy. No bleeding reported by the patient.  Patient was seen by physical therapy, who recommended Home Health, pt already active with home health via [place]. On the day of the discharge the patient's vitals were stable, and no other acute medical condition were reported by patient. The patient was felt safe to be discharge at Home with Home health.  Consultants: none Procedures: none  DISCHARGE MEDICATION: Allergies as of 02/06/2021      Reactions   Furosemide Swelling, Other (See Comments)   Cramping, also (patient disputes this in 2022)   Hydrochlorothiazide Other (See Comments)   Hypokalemia (patient disputes this in 2022)       Medication List    TAKE these medications   acetaminophen 325 MG tablet Commonly known as: TYLENOL Take 2 tablets (650 mg total) by mouth every 6 (six) hours as needed for mild pain (or Fever >/= 101).   albuterol 108 (90 Base) MCG/ACT inhaler Commonly known as: VENTOLIN HFA Inhale 2 puffs into the lungs every 4 (four) hours as needed for wheezing or shortness of breath.   aspirin 81 MG EC tablet Take 1 tablet (81 mg total) by mouth daily.   atorvastatin 40 MG tablet Commonly known as: LIPITOR Take 40 mg by mouth at bedtime.   bisoprolol 5 MG tablet Commonly known as: ZEBETA Take 1 tablet (5 mg total) by mouth daily.   clopidogrel 75 MG tablet Commonly known as: PLAVIX Take 1 tablet (75 mg total) by mouth daily.   cyanocobalamin 1000 MCG tablet Take 1 tablet (1,000 mcg total) by mouth daily.   diltiazem 240 MG 24 hr capsule Commonly  known as: CARDIZEM CD Take 1 capsule (240 mg total) by mouth daily.   feeding supplement (GLUCERNA SHAKE) Liqd Take 237 mLs by mouth 2 (two) times daily between meals. What changed: Another medication with the same name was removed. Continue taking this medication, and follow the directions you see here.   ferrous sulfate 325 (65 FE) MG tablet Take 1 tablet (325 mg total) by mouth 2 (two) times daily with a meal.   Fluticasone-Salmeterol 250-50 MCG/DOSE Aepb Commonly known as: ADVAIR Inhale 1 puff into the lungs 2 (two) times daily.   furosemide 40 MG tablet Commonly known as: LASIX Take 1 tablet (40 mg total) by mouth daily.   insulin aspart protamine - aspart (70-30) 100 UNIT/ML FlexPen Commonly known as: NOVOLOG 70/30 MIX Inject 0.2 mLs (20 Units total) into the skin 2 (two) times daily with a meal.   magnesium oxide 400 (241.3 Mg) MG tablet Commonly known as: MAG-OX Take 1 tablet (400 mg total) by mouth daily.   Magnesium Oxide 420 MG Tabs Take 420 mg by mouth daily.   metFORMIN 1000 MG tablet Commonly known as:  GLUCOPHAGE Take 1,000 mg by mouth 2 (two) times daily with a meal.   multivitamin with minerals Tabs tablet Take 1 tablet by mouth daily.   nortriptyline 50 MG capsule Commonly known as: PAMELOR Take 50 mg by mouth at bedtime.   Omega-3 1000 MG Caps Take 1,000 mg by mouth in the morning and at bedtime.   oxybutynin 10 MG 24 hr tablet Commonly known as: DITROPAN-XL Take 10 mg by mouth daily.   OXYGEN Inhale 4.5 L/min into the lungs continuous.   pantoprazole 40 MG tablet Commonly known as: PROTONIX Take 1 tablet (40 mg total) by mouth daily. What changed: when to take this   senna-docusate 8.6-50 MG tablet Commonly known as: Senokot-S Take 1 tablet by mouth at bedtime as needed for mild constipation.   Tiotropium Bromide Monohydrate 2.5 MCG/ACT Aers Inhale 2 puffs into the lungs daily.       Discharge Exam: Filed Weights   02/02/21 0818  Weight: 80.5 kg   Vitals:   02/06/21 1107 02/06/21 1108  BP:    Pulse: (!) 102 97  Resp: (!) 22 20  Temp:    SpO2: (!) 84% 94%   General: Appear in no distress, no Rash; Oral Mucosa Clear, moist. no Abnormal Neck Mass Or lumps, Conjunctiva normal  Cardiovascular: S1 and S2 Present, no Murmur, Respiratory: normal respiratory effort, Bilateral Air entry present and faint Crackles, no wheezes Abdomen: Bowel Sound present, Soft and no tenderness Extremities: trace Pedal edema Neurology: alert and oriented to time, place, and person affect appropriate. no new focal deficit Gait not checked due to patient safety concerns  The results of significant diagnostics from this hospitalization (including imaging, microbiology, ancillary and laboratory) are listed below for reference.    Significant Diagnostic Studies: DG Chest 1 View  Result Date: 02/01/2021 CLINICAL DATA:  Shortness of breath. EXAM: CHEST  1 VIEW COMPARISON:  January 07, 2021 FINDINGS: The heart size is enlarged. Aortic calcifications are noted. There are worsening  diffuse bilateral airspace opacities with prominent interstitial lung markings. There are bilateral pleural effusions which have increased in size since the patient's prior chest x-ray. There is no pneumothorax. No definite acute osseous abnormality. IMPRESSION: 1. Cardiomegaly with findings suggestive of congestive heart failure with bilateral growing pleural effusions. 2. Worsening diffuse airspace opacities may represent progressive pulmonary edema or an underlying atypical infectious process. 3.  Emphysema. Electronically Signed   By: Constance Holster M.D.   On: 02/01/2021 18:16   CT ANGIO CHEST PE W OR WO CONTRAST  Result Date: 01/09/2021 CLINICAL DATA:  COPD, short of breath. Prostate cancer. Rule out pulmonary embolism. EXAM: CT ANGIOGRAPHY CHEST WITH CONTRAST TECHNIQUE: Multidetector CT imaging of the chest was performed using the standard protocol during bolus administration of intravenous contrast. Multiplanar CT image reconstructions and MIPs were obtained to evaluate the vascular anatomy. CONTRAST:  51mL OMNIPAQUE IOHEXOL 350 MG/ML SOLN COMPARISON:  CT chest 10/13/2020 FINDINGS: Cardiovascular: Negative for pulmonary embolism. Pulmonary arteries diffusely enlarged compatible with pulmonary artery hypertension from COPD. Main pulmonary artery 3.8 cm in diameter. Atherosclerotic calcification aortic arch. Ascending aorta dilated at 4.7 cm unchanged from the prior study. No dissection. Extensive coronary artery calcification involving right and left coronary arteries. Heart size upper normal. No pericardial effusion. Mediastinum/Nodes: Negative for mass or adenopathy. Lungs/Pleura: Mild to moderate bilateral pleural effusions right greater than left with progression from the prior study. Mild dependent atelectasis in both lung bases. Underlying COPD with emphysema most notably in the apices. No acute infiltrate or mass in the lungs. Upper Abdomen: Liver cyst 6 x 4 cm unchanged from the prior study.  Atherosclerotic calcification abdominal aorta and renal arteries. Musculoskeletal: Multilevel thoracic disc degeneration and spurring. Sclerotic findings in the vertebral bodies appear degenerative. Negative for fracture. Review of the MIP images confirms the above findings. IMPRESSION: 1. Negative for pulmonary embolism 2. COPD with pulmonary artery hypertension 3. Enlarging bilateral pleural effusions right greater than left. 4. Aortic aneurysm NOS (ICD10-I71.9). 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 5. Extensive coronary calcification. Aortic Atherosclerosis (ICD10-I70.0) and Emphysema (ICD10-J43.9). Electronically Signed   By: Franchot Gallo M.D.   On: 01/09/2021 09:13   CARDIAC CATHETERIZATION  Result Date: 01/13/2021  Prox LAD lesion is 90% stenosed.  Prox LAD to Mid LAD lesion is 60% stenosed.  A drug-eluting stent was successfully placed using a STENT RESOLUTE ONYX 3.5X30.  Post intervention, there is a 0% residual stenosis.  Post intervention, there is a 0% residual stenosis.  1. Successful PCI of the proximal to mid LAD with orbital atherectomy and stenting with DES x 1 and IVUS guidance Plan: DAPT for one year. Anticipate DC tomorrow if no complications.   CARDIAC CATHETERIZATION  Result Date: 01/11/2021 1.  Severe calcific single-vessel coronary artery disease involving a severely calcified eccentric proximal LAD lesion 2.  Patent left main, patent left circumflex with moderate nonobstructive stenosis, patent RCA with moderate nonobstructive stenosis 3.  Moderate pulmonary hypertension with normal pulmonary capillary wedge pressure/LVEDP 4.  Unsuccessful orbital atherectomy due to inability to fully treat the lesion. Recommendations: Recommend another attempt at PCI from femoral access once the patient recovers from this procedure.  Hopefully femoral access will give better guide support and allow for completion of the procedure.  I think that the lack of contralateral aortic  support and steep angulation of the left main/LAD origin contributed to technical challenges with this case.  Plan reviewed with the patient.  Continue aspirin and clopidogrel.  ECHOCARDIOGRAM COMPLETE  Result Date: 01/09/2021    ECHOCARDIOGRAM REPORT   Patient Name:   ANTWINE AGOSTO Date of Exam: 01/09/2021 Medical Rec #:  [id number]     Height:       78.0 in Accession #:    [id number]    Weight:       174.8 lb Date of Birth:  Jul 25, 1946     BSA:  2.132 m Patient Age:    58 years      BP:           113/65 mmHg Patient Gender: M             HR:           93 bpm. Exam Location:  Inpatient Procedure: 2D Echo Indications:    elevated troponin  History:        Patient has prior history of Echocardiogram examinations, most                 recent 10/14/2020. COPD; Risk Factors:Diabetes.  Sonographer:    Johny Chess Referring Phys: [place]  1. Left ventricular ejection fraction, by estimation, is 60 to 65%. The left ventricle has normal function. The left ventricle has no regional wall motion abnormalities. There is mild concentric left ventricular hypertrophy and moderate basal septal hypertrophy.  2. Right ventricular systolic function is moderately reduced. The right ventricular size is moderately enlarged. There is severely elevated pulmonary artery systolic pressure. The estimated right ventricular systolic pressure is 85.0 mmHg.  3. The mitral valve is degenerative. No evidence of mitral valve regurgitation. No evidence of mitral stenosis.  4. The inferior vena cava is dilated in size with >50% respiratory variability, suggesting right atrial pressure of 8 mmHg.  5. The aortic valve is tricuspid. There is moderate calcification of the aortic valve primarily of the left coronary cusp. There is moderate thickening of the aortic valve. Aortic valve regurgitation is mild. Mild to moderate aortic valve sclerosis/calcification is present, without any evidence of aortic stenosis.   6. There is mild dilatation at the level of the sinuses of Valsalva, measuring 37 mm. There is mild dilatation of the ascending aorta, measuring 42 mm.  7. Tricuspid valve regurgitation is mild to moderate.  8. Right atrial size was moderately dilated. FINDINGS  Left Ventricle: Left ventricular ejection fraction, by estimation, is 65 to 70%. The left ventricle has normal function. The left ventricle has no regional wall motion abnormalities. The left ventricular internal cavity size was normal in size. There is  mild concentric left ventricular hypertrophy. Left ventricular diastolic function could not be evaluated. Right Ventricle: The right ventricular size is moderately enlarged. No increase in right ventricular wall thickness. Right ventricular systolic function is moderately reduced. There is severely elevated pulmonary artery systolic pressure. The tricuspid regurgitant velocity is 3.76 m/s, and with an assumed right atrial pressure of 8 mmHg, the estimated right ventricular systolic pressure is 27.7 mmHg. Left Atrium: Left atrial size was normal in size. Right Atrium: Right atrial size was moderately dilated. Pericardium: There is no evidence of pericardial effusion. Mitral Valve: The mitral valve is degenerative in appearance. There is mild thickening of the mitral valve leaflet(s). There is mild calcification of the mitral valve leaflet(s). Mild to moderate mitral annular calcification. No evidence of mitral valve regurgitation. No evidence of mitral valve stenosis. Tricuspid Valve: The tricuspid valve is normal in structure. Tricuspid valve regurgitation is mild to moderate. No evidence of tricuspid stenosis. Aortic Valve: The aortic valve is tricuspid. There is moderate calcification of the aortic valve. There is moderate thickening of the aortic valve. Aortic valve regurgitation is mild. Mild to moderate aortic valve sclerosis/calcification is present, without any evidence of aortic stenosis. Pulmonic  Valve: The pulmonic valve was normal in structure. Pulmonic valve regurgitation is trivial. No evidence of pulmonic stenosis. Aorta: The aortic root is normal in size and structure. There  is mild dilatation at the level of the sinuses of Valsalva, measuring 37 mm. There is mild dilatation of the ascending aorta, measuring 42 mm. Venous: The inferior vena cava is dilated in size with greater than 50% respiratory variability, suggesting right atrial pressure of 8 mmHg. IAS/Shunts: No atrial level shunt detected by color flow Doppler.  LEFT VENTRICLE PLAX 2D LVIDd:         4.10 cm LVIDs:         2.60 cm LV PW:         1.20 cm LV IVS:        1.30 cm LVOT diam:     2.30 cm LV SV:         88 LV SV Index:   41 LVOT Area:     4.15 cm  RIGHT VENTRICLE             IVC RV S prime:     13.60 cm/s  IVC diam: 2.50 cm TAPSE (M-mode): 1.6 cm LEFT ATRIUM             Index       RIGHT ATRIUM           Index LA diam:        4.20 cm 1.97 cm/m  RA Area:     19.50 cm LA Vol (A2C):   50.5 ml 23.69 ml/m RA Volume:   55.30 ml  25.94 ml/m LA Vol (A4C):   71.4 ml 33.49 ml/m LA Biplane Vol: 64.9 ml 30.44 ml/m  AORTIC VALVE LVOT Vmax:   96.00 cm/s LVOT Vmean:  66.400 cm/s LVOT VTI:    0.212 m  AORTA Ao Root diam: 3.70 cm Ao Asc diam:  4.20 cm TRICUSPID VALVE TR Peak grad:   56.6 mmHg TR Vmax:        376.00 cm/s  SHUNTS Systemic VTI:  0.21 m Systemic Diam: 2.30 cm Fransico Him MD Electronically signed by Fransico Him MD Signature Date/Time: 01/09/2021/10:24:02 AM    Final     Microbiology: Recent Results (from the past 240 hour(s))  MRSA PCR Screening     Status: None   Collection Time: 02/02/21  6:39 PM   Specimen: Nasal Mucosa; Nasopharyngeal  Result Value Ref Range Status   MRSA by PCR NEGATIVE NEGATIVE Final    Comment:        The GeneXpert MRSA Assay (FDA approved for NASAL specimens only), is one component of a comprehensive MRSA colonization surveillance program. It is not intended to diagnose MRSA infection nor to  guide or monitor treatment for MRSA infections. Performed at [facility], [place] [address] [place], [place] [address]      Labs: CBC: Recent Labs  Lab 02/01/21 1846 02/01/21 2234 02/01/21 2304 02/02/21 0532 02/02/21 0821 02/03/21 0641 02/04/21 0050 02/05/21 0151  WBC 10.8* 8.5  --  6.6  --  15.5* 13.0* 10.2  NEUTROABS 10.4*  --   --   --   --  14.1* 10.9* 7.1  HGB 10.3* 9.6*   < > 10.0* 10.5* 10.2* 10.1* 10.2*  HCT 35.0* 31.4*   < > 32.1* 31.0* 33.3* 31.7* 33.7*  MCV 80.1 79.1*  --  78.5*  --  78.2* 76.6* 77.6*  PLT 397 361  --  357  --  410* 390 375   < > = values in this interval not displayed.   Basic Metabolic Panel: Recent Labs  Lab 02/01/21 1846 02/01/21 2234 02/01/21 2304 02/02/21 0532 02/02/21  7583 02/03/21 0641 02/04/21 0050 02/05/21 0151  NA 134*  --    < > 133* 134* 136 134* 135  K 5.2*  --    < > 4.5 4.3 4.2 4.1 3.5  CL 104  --   --  101  --  101 99 99  CO2 17*  --   --  19*  --  24 25 24   GLUCOSE 221*  --   --  270*  --  185* 211* 175*  BUN 16  --   --  14  --  15 22 17   CREATININE 1.18 1.03  --  1.09  --  1.06 1.09 0.96  CALCIUM 9.1  --   --  8.6*  --  8.9 8.6* 8.4*   < > = values in this interval not displayed.   Liver Function Tests: Recent Labs  Lab 02/01/21 1846 02/03/21 0641 02/04/21 0050 02/05/21 0151  AST 22 20 29 19   ALT 15 13 15 16   ALKPHOS 75 74 73 61  BILITOT 0.7 1.3* 1.1 0.6  PROT 6.1* 5.7* 5.5* 5.0*  ALBUMIN 3.0* 2.9* 2.7* 2.6*   CBG: Recent Labs  Lab 02/05/21 1144 02/05/21 1549 02/05/21 2100 02/06/21 0617 02/06/21 1147  GLUCAP 368* 235* 160* 140* 252*    Time spent: 35 minutes  Signed:  Berle Mull  [facility] 02/06/2021 8:20 AM

## 2021-02-19 ENCOUNTER — Other Ambulatory Visit (HOSPITAL_COMMUNITY): Payer: Medicare HMO

## 2021-02-19 ENCOUNTER — Encounter (HOSPITAL_COMMUNITY): Payer: Self-pay

## 2021-02-19 ENCOUNTER — Emergency Department (HOSPITAL_COMMUNITY): Payer: No Typology Code available for payment source

## 2021-02-19 ENCOUNTER — Inpatient Hospital Stay (HOSPITAL_COMMUNITY)
Admission: EM | Admit: 2021-02-19 | Discharge: 2021-02-24 | DRG: 189 | Disposition: A | Discharge status: Home Health | Payer: No Typology Code available for payment source | Attending: Internal Medicine | Admitting: Internal Medicine

## 2021-02-19 DIAGNOSIS — Z7984 Long term (current) use of oral hypoglycemic drugs: Secondary | ICD-10-CM

## 2021-02-19 DIAGNOSIS — Z7951 Long term (current) use of inhaled steroids: Secondary | ICD-10-CM

## 2021-02-19 DIAGNOSIS — E876 Hypokalemia: Secondary | ICD-10-CM | POA: Diagnosis present

## 2021-02-19 DIAGNOSIS — I10 Essential (primary) hypertension: Secondary | ICD-10-CM | POA: Diagnosis not present

## 2021-02-19 DIAGNOSIS — Z79899 Other long term (current) drug therapy: Secondary | ICD-10-CM

## 2021-02-19 DIAGNOSIS — Z794 Long term (current) use of insulin: Secondary | ICD-10-CM

## 2021-02-19 DIAGNOSIS — I252 Old myocardial infarction: Secondary | ICD-10-CM | POA: Diagnosis not present

## 2021-02-19 DIAGNOSIS — K861 Other chronic pancreatitis: Secondary | ICD-10-CM | POA: Diagnosis present

## 2021-02-19 DIAGNOSIS — I714 Abdominal aortic aneurysm, without rupture: Secondary | ICD-10-CM | POA: Diagnosis present

## 2021-02-19 DIAGNOSIS — Z6820 Body mass index (BMI) 20.0-20.9, adult: Secondary | ICD-10-CM

## 2021-02-19 DIAGNOSIS — D649 Anemia, unspecified: Secondary | ICD-10-CM | POA: Diagnosis not present

## 2021-02-19 DIAGNOSIS — E785 Hyperlipidemia, unspecified: Secondary | ICD-10-CM | POA: Diagnosis present

## 2021-02-19 DIAGNOSIS — Z9981 Dependence on supplemental oxygen: Secondary | ICD-10-CM | POA: Diagnosis not present

## 2021-02-19 DIAGNOSIS — J181 Lobar pneumonia, unspecified organism: Secondary | ICD-10-CM | POA: Diagnosis present

## 2021-02-19 DIAGNOSIS — K5904 Chronic idiopathic constipation: Secondary | ICD-10-CM | POA: Diagnosis present

## 2021-02-19 DIAGNOSIS — I2510 Atherosclerotic heart disease of native coronary artery without angina pectoris: Secondary | ICD-10-CM | POA: Diagnosis present

## 2021-02-19 DIAGNOSIS — R0902 Hypoxemia: Secondary | ICD-10-CM

## 2021-02-19 DIAGNOSIS — E43 Unspecified severe protein-calorie malnutrition: Secondary | ICD-10-CM | POA: Insufficient documentation

## 2021-02-19 DIAGNOSIS — K219 Gastro-esophageal reflux disease without esophagitis: Secondary | ICD-10-CM | POA: Diagnosis present

## 2021-02-19 DIAGNOSIS — N3281 Overactive bladder: Secondary | ICD-10-CM | POA: Diagnosis present

## 2021-02-19 DIAGNOSIS — I110 Hypertensive heart disease with heart failure: Secondary | ICD-10-CM | POA: Diagnosis present

## 2021-02-19 DIAGNOSIS — I5033 Acute on chronic diastolic (congestive) heart failure: Secondary | ICD-10-CM | POA: Diagnosis present

## 2021-02-19 DIAGNOSIS — E1151 Type 2 diabetes mellitus with diabetic peripheral angiopathy without gangrene: Secondary | ICD-10-CM | POA: Diagnosis present

## 2021-02-19 DIAGNOSIS — J440 Chronic obstructive pulmonary disease with acute lower respiratory infection: Secondary | ICD-10-CM | POA: Diagnosis present

## 2021-02-19 DIAGNOSIS — E269 Hyperaldosteronism, unspecified: Secondary | ICD-10-CM | POA: Diagnosis present

## 2021-02-19 DIAGNOSIS — T502X5A Adverse effect of carbonic-anhydrase inhibitors, benzothiadiazides and other diuretics, initial encounter: Secondary | ICD-10-CM | POA: Diagnosis not present

## 2021-02-19 DIAGNOSIS — Z888 Allergy status to other drugs, medicaments and biological substances status: Secondary | ICD-10-CM

## 2021-02-19 DIAGNOSIS — J9621 Acute and chronic respiratory failure with hypoxia: Principal | ICD-10-CM | POA: Diagnosis present

## 2021-02-19 DIAGNOSIS — I471 Supraventricular tachycardia: Secondary | ICD-10-CM | POA: Diagnosis present

## 2021-02-19 DIAGNOSIS — Z8673 Personal history of transient ischemic attack (TIA), and cerebral infarction without residual deficits: Secondary | ICD-10-CM

## 2021-02-19 DIAGNOSIS — Z9079 Acquired absence of other genital organ(s): Secondary | ICD-10-CM

## 2021-02-19 DIAGNOSIS — Z20822 Contact with and (suspected) exposure to covid-19: Secondary | ICD-10-CM | POA: Diagnosis present

## 2021-02-19 DIAGNOSIS — Z7902 Long term (current) use of antithrombotics/antiplatelets: Secondary | ICD-10-CM

## 2021-02-19 DIAGNOSIS — Z8546 Personal history of malignant neoplasm of prostate: Secondary | ICD-10-CM | POA: Diagnosis not present

## 2021-02-19 DIAGNOSIS — R0600 Dyspnea, unspecified: Secondary | ICD-10-CM

## 2021-02-19 DIAGNOSIS — Z7982 Long term (current) use of aspirin: Secondary | ICD-10-CM

## 2021-02-19 DIAGNOSIS — Z87891 Personal history of nicotine dependence: Secondary | ICD-10-CM

## 2021-02-19 DIAGNOSIS — I5031 Acute diastolic (congestive) heart failure: Secondary | ICD-10-CM | POA: Diagnosis not present

## 2021-02-19 DIAGNOSIS — E119 Type 2 diabetes mellitus without complications: Secondary | ICD-10-CM

## 2021-02-19 DIAGNOSIS — Z8249 Family history of ischemic heart disease and other diseases of the circulatory system: Secondary | ICD-10-CM

## 2021-02-19 DIAGNOSIS — J441 Chronic obstructive pulmonary disease with (acute) exacerbation: Secondary | ICD-10-CM | POA: Diagnosis present

## 2021-02-19 DIAGNOSIS — E1159 Type 2 diabetes mellitus with other circulatory complications: Secondary | ICD-10-CM | POA: Diagnosis not present

## 2021-02-19 DIAGNOSIS — Z955 Presence of coronary angioplasty implant and graft: Secondary | ICD-10-CM

## 2021-02-19 DIAGNOSIS — I5030 Unspecified diastolic (congestive) heart failure: Secondary | ICD-10-CM | POA: Diagnosis present

## 2021-02-19 LAB — I-STAT VENOUS BLOOD GAS, ED
Acid-Base Excess: 2 mmol/L (ref 0.0–2.0)
Bicarbonate: 27.4 mmol/L (ref 20.0–28.0)
Calcium, Ion: 1.14 mmol/L — ABNORMAL LOW (ref 1.15–1.40)
HCT: 39 % (ref 39.0–52.0)
Hemoglobin: 13.3 g/dL (ref 13.0–17.0)
O2 Saturation: 79 %
Potassium: 3.8 mmol/L (ref 3.5–5.1)
Sodium: 140 mmol/L (ref 135–145)
TCO2: 29 mmol/L (ref 22–32)
pCO2, Ven: 44.5 mmHg (ref 44.0–60.0)
pH, Ven: 7.397 (ref 7.250–7.430)
pO2, Ven: 43 mmHg (ref 32.0–45.0)

## 2021-02-19 LAB — CBG MONITORING, ED
Glucose-Capillary: 109 mg/dL — ABNORMAL HIGH (ref 70–99)
Glucose-Capillary: 301 mg/dL — ABNORMAL HIGH (ref 70–99)
Glucose-Capillary: 322 mg/dL — ABNORMAL HIGH (ref 70–99)

## 2021-02-19 LAB — CBC WITH DIFFERENTIAL/PLATELET
Abs Immature Granulocytes: 0.04 10*3/uL (ref 0.00–0.07)
Basophils Absolute: 0 10*3/uL (ref 0.0–0.1)
Basophils Relative: 0 %
Eosinophils Absolute: 0 10*3/uL (ref 0.0–0.5)
Eosinophils Relative: 0 %
HCT: 38.5 % — ABNORMAL LOW (ref 39.0–52.0)
Hemoglobin: 11.9 g/dL — ABNORMAL LOW (ref 13.0–17.0)
Immature Granulocytes: 0 %
Lymphocytes Relative: 19 %
Lymphs Abs: 2 10*3/uL (ref 0.7–4.0)
MCH: 26.3 pg (ref 26.0–34.0)
MCHC: 30.9 g/dL (ref 30.0–36.0)
MCV: 85.2 fL (ref 80.0–100.0)
Monocytes Absolute: 0.6 10*3/uL (ref 0.1–1.0)
Monocytes Relative: 6 %
Neutro Abs: 7.4 10*3/uL (ref 1.7–7.7)
Neutrophils Relative %: 75 %
Platelets: 351 10*3/uL (ref 150–400)
RBC: 4.52 MIL/uL (ref 4.22–5.81)
RDW: 28.5 % — ABNORMAL HIGH (ref 11.5–15.5)
WBC: 10 10*3/uL (ref 4.0–10.5)
nRBC: 0.2 % (ref 0.0–0.2)

## 2021-02-19 LAB — MRSA PCR SCREENING: MRSA by PCR: NEGATIVE

## 2021-02-19 LAB — BLOOD GAS, VENOUS
Acid-base deficit: 2.1 mmol/L — ABNORMAL HIGH (ref 0.0–2.0)
Bicarbonate: 22.3 mmol/L (ref 20.0–28.0)
O2 Saturation: 85.2 %
Patient temperature: 37
pCO2, Ven: 39.5 mmHg — ABNORMAL LOW (ref 44.0–60.0)
pH, Ven: 7.371 (ref 7.250–7.430)

## 2021-02-19 LAB — PROCALCITONIN: Procalcitonin: <0.1 ng/mL

## 2021-02-19 LAB — GLUCOSE, CAPILLARY: Glucose-Capillary: 366 mg/dL — ABNORMAL HIGH (ref 70–99)

## 2021-02-19 LAB — RESP PANEL BY RT-PCR (FLU A&B, COVID) ARPGX2
Influenza A by PCR: NEGATIVE
Influenza B by PCR: NEGATIVE
SARS Coronavirus 2 by RT PCR: NEGATIVE

## 2021-02-19 LAB — BASIC METABOLIC PANEL
Anion gap: 13 (ref 5–15)
BUN: 16 mg/dL (ref 8–23)
CO2: 24 mmol/L (ref 22–32)
Calcium: 8.8 mg/dL — ABNORMAL LOW (ref 8.9–10.3)
Chloride: 101 mmol/L (ref 98–111)
Creatinine, Ser: 1.14 mg/dL (ref 0.61–1.24)
GFR, Estimated: >60 mL/min (ref >60)
Glucose, Bld: 173 mg/dL — ABNORMAL HIGH (ref 70–99)
Potassium: 3.7 mmol/L (ref 3.5–5.1)
Sodium: 138 mmol/L (ref 135–145)

## 2021-02-19 LAB — TROPONIN I (HIGH SENSITIVITY): Troponin I (High Sensitivity): 15 ng/L (ref <18)

## 2021-02-19 LAB — BRAIN NATRIURETIC PEPTIDE: B Natriuretic Peptide: 159.7 pg/mL — ABNORMAL HIGH (ref 0.0–100.0)

## 2021-02-19 MED ORDER — ENOXAPARIN SODIUM 40 MG/0.4ML ~~LOC~~ SOLN
40.0000 mg | Freq: Every day | SUBCUTANEOUS | Status: DC
  Administered 2021-02-19 – 2021-02-24 (×6): 40 mg via SUBCUTANEOUS
  Filled 2021-02-19 (×6): qty 0.4

## 2021-02-19 MED ORDER — DILTIAZEM HCL 25 MG/5ML IV SOLN
5.0000 mg | Freq: Once | INTRAVENOUS | Status: AC
  Administered 2021-02-19: 5 mg via INTRAVENOUS
  Filled 2021-02-19: qty 5

## 2021-02-19 MED ORDER — GLUCERNA SHAKE PO LIQD
237.0000 mL | Freq: Two times a day (BID) | ORAL | Status: DC
  Administered 2021-02-20 – 2021-02-24 (×5): 237 mL via ORAL

## 2021-02-19 MED ORDER — INSULIN ASPART PROT & ASPART (70-30 MIX) 100 UNIT/ML ~~LOC~~ SUSP
20.0000 [IU] | Freq: Two times a day (BID) | SUBCUTANEOUS | Status: DC
  Administered 2021-02-19 – 2021-02-24 (×10): 20 [IU] via SUBCUTANEOUS
  Filled 2021-02-19: qty 10

## 2021-02-19 MED ORDER — BISOPROLOL FUMARATE 5 MG PO TABS
5.0000 mg | ORAL_TABLET | Freq: Every day | ORAL | Status: DC
  Administered 2021-02-19 – 2021-02-24 (×6): 5 mg via ORAL
  Filled 2021-02-19 (×6): qty 1

## 2021-02-19 MED ORDER — SODIUM CHLORIDE 0.9 % IV SOLN
500.0000 mg | Freq: Once | INTRAVENOUS | Status: AC
  Administered 2021-02-19: 500 mg via INTRAVENOUS
  Filled 2021-02-19: qty 500

## 2021-02-19 MED ORDER — MAGNESIUM OXIDE 420 MG PO TABS: 420.0000 mg | ORAL_TABLET | Freq: Every day | ORAL | Status: DC

## 2021-02-19 MED ORDER — FUROSEMIDE 10 MG/ML IJ SOLN
60.0000 mg | Freq: Once | INTRAMUSCULAR | Status: AC
  Administered 2021-02-19: 60 mg via INTRAVENOUS
  Filled 2021-02-19: qty 6

## 2021-02-19 MED ORDER — SODIUM CHLORIDE 0.9 % IV SOLN
500.0000 mg | INTRAVENOUS | Status: AC
  Administered 2021-02-20 – 2021-02-23 (×4): 500 mg via INTRAVENOUS
  Filled 2021-02-19 (×5): qty 500

## 2021-02-19 MED ORDER — IPRATROPIUM-ALBUTEROL 0.5-2.5 (3) MG/3ML IN SOLN
3.0000 mL | Freq: Four times a day (QID) | RESPIRATORY_TRACT | Status: DC
  Administered 2021-02-19 – 2021-02-20 (×3): 3 mL via RESPIRATORY_TRACT
  Filled 2021-02-19 (×4): qty 3

## 2021-02-19 MED ORDER — ACETAMINOPHEN 325 MG PO TABS
650.0000 mg | ORAL_TABLET | ORAL | Status: DC | PRN
  Administered 2021-02-22: 650 mg via ORAL
  Filled 2021-02-19: qty 2

## 2021-02-19 MED ORDER — ONDANSETRON HCL 4 MG/2ML IJ SOLN: 4.0000 mg | Freq: Four times a day (QID) | INTRAMUSCULAR | Status: DC | PRN

## 2021-02-19 MED ORDER — MOMETASONE FURO-FORMOTEROL FUM 200-5 MCG/ACT IN AERO: 2.0000 | INHALATION_SPRAY | Freq: Two times a day (BID) | RESPIRATORY_TRACT | Status: DC

## 2021-02-19 MED ORDER — MAGNESIUM OXIDE 400 (241.3 MG) MG PO TABS
400.0000 mg | ORAL_TABLET | Freq: Every day | ORAL | Status: DC
  Administered 2021-02-19 – 2021-02-24 (×6): 400 mg via ORAL
  Filled 2021-02-19 (×6): qty 1

## 2021-02-19 MED ORDER — PANTOPRAZOLE SODIUM 40 MG PO TBEC
40.0000 mg | DELAYED_RELEASE_TABLET | Freq: Every day | ORAL | Status: DC
  Administered 2021-02-20 – 2021-02-24 (×5): 40 mg via ORAL
  Filled 2021-02-19 (×6): qty 1

## 2021-02-19 MED ORDER — SODIUM CHLORIDE 0.9 % IV SOLN
1.0000 g | INTRAVENOUS | Status: AC
  Administered 2021-02-20 – 2021-02-23 (×4): 1 g via INTRAVENOUS
  Filled 2021-02-19 (×4): qty 10

## 2021-02-19 MED ORDER — OXYBUTYNIN CHLORIDE ER 10 MG PO TB24
10.0000 mg | ORAL_TABLET | Freq: Every day | ORAL | Status: DC
  Administered 2021-02-19 – 2021-02-24 (×6): 10 mg via ORAL
  Filled 2021-02-19 (×6): qty 1

## 2021-02-19 MED ORDER — ATORVASTATIN CALCIUM 40 MG PO TABS
40.0000 mg | ORAL_TABLET | Freq: Every day | ORAL | Status: DC
  Administered 2021-02-19 – 2021-02-23 (×5): 40 mg via ORAL
  Filled 2021-02-19 (×5): qty 1

## 2021-02-19 MED ORDER — METHYLPREDNISOLONE SODIUM SUCC 125 MG IJ SOLR
60.0000 mg | Freq: Four times a day (QID) | INTRAMUSCULAR | Status: AC
  Administered 2021-02-19 – 2021-02-20 (×4): 60 mg via INTRAVENOUS
  Filled 2021-02-19 (×4): qty 2

## 2021-02-19 MED ORDER — SODIUM CHLORIDE 0.9% FLUSH
3.0000 mL | Freq: Two times a day (BID) | INTRAVENOUS | Status: DC
  Administered 2021-02-19 – 2021-02-23 (×9): 3 mL via INTRAVENOUS

## 2021-02-19 MED ORDER — NORTRIPTYLINE HCL 25 MG PO CAPS
50.0000 mg | ORAL_CAPSULE | Freq: Every day | ORAL | Status: DC
  Administered 2021-02-19 – 2021-02-23 (×5): 50 mg via ORAL
  Filled 2021-02-19 (×6): qty 2

## 2021-02-19 MED ORDER — INSULIN ASPART PROT & ASPART (70-30 MIX) 100 UNIT/ML PEN: 20.0000 [IU] | PEN_INJECTOR | Freq: Two times a day (BID) | SUBCUTANEOUS | Status: DC

## 2021-02-19 MED ORDER — SODIUM CHLORIDE 0.9 % IV SOLN
1.0000 g | Freq: Once | INTRAVENOUS | Status: AC
  Administered 2021-02-19: 1 g via INTRAVENOUS
  Filled 2021-02-19: qty 10

## 2021-02-19 MED ORDER — ALBUTEROL (5 MG/ML) CONTINUOUS INHALATION SOLN
15.0000 mg/h | INHALATION_SOLUTION | Freq: Once | RESPIRATORY_TRACT | Status: AC
  Administered 2021-02-19: 15 mg/h via RESPIRATORY_TRACT
  Filled 2021-02-19: qty 20

## 2021-02-19 MED ORDER — FUROSEMIDE 10 MG/ML IJ SOLN
40.0000 mg | Freq: Two times a day (BID) | INTRAMUSCULAR | Status: DC
  Administered 2021-02-20 – 2021-02-24 (×9): 40 mg via INTRAVENOUS
  Filled 2021-02-19 (×9): qty 4

## 2021-02-19 MED ORDER — SODIUM CHLORIDE 0.9 % IV SOLN: 250.0000 mL | INTRAVENOUS | Status: DC | PRN

## 2021-02-19 MED ORDER — PREDNISONE 20 MG PO TABS
40.0000 mg | ORAL_TABLET | Freq: Every day | ORAL | Status: AC
  Administered 2021-02-21 – 2021-02-24 (×4): 40 mg via ORAL
  Filled 2021-02-19 (×5): qty 2

## 2021-02-19 MED ORDER — DILTIAZEM HCL ER COATED BEADS 240 MG PO CP24
240.0000 mg | ORAL_CAPSULE | Freq: Every day | ORAL | Status: DC
  Administered 2021-02-19 – 2021-02-24 (×6): 240 mg via ORAL
  Filled 2021-02-19 (×6): qty 1

## 2021-02-19 MED ORDER — CLOPIDOGREL BISULFATE 75 MG PO TABS
75.0000 mg | ORAL_TABLET | Freq: Every day | ORAL | Status: DC
  Administered 2021-02-19 – 2021-02-24 (×6): 75 mg via ORAL
  Filled 2021-02-19 (×6): qty 1

## 2021-02-19 MED ORDER — BUDESONIDE 0.5 MG/2ML IN SUSP
0.5000 mg | Freq: Two times a day (BID) | RESPIRATORY_TRACT | Status: DC
  Administered 2021-02-19 – 2021-02-24 (×11): 0.5 mg via RESPIRATORY_TRACT
  Filled 2021-02-19 (×13): qty 2

## 2021-02-19 MED ORDER — SODIUM CHLORIDE 0.9% FLUSH: 3.0000 mL | INTRAVENOUS | Status: DC | PRN

## 2021-02-19 MED ORDER — ARFORMOTEROL TARTRATE 15 MCG/2ML IN NEBU
15.0000 ug | INHALATION_SOLUTION | Freq: Two times a day (BID) | RESPIRATORY_TRACT | Status: DC
  Administered 2021-02-19 – 2021-02-24 (×11): 15 ug via RESPIRATORY_TRACT
  Filled 2021-02-19 (×13): qty 2

## 2021-02-19 NOTE — Progress Notes (Signed)
Took patient off of BiPAP and placed on 6LHFNC, patient tolerated well with SATS of 96%.

## 2021-02-19 NOTE — ED Notes (Signed)
Service Resource called @ 1803-Dinner Tray Ordered. 

## 2021-02-19 NOTE — ED Notes (Signed)
Attempted to call report

## 2021-02-19 NOTE — ED Provider Notes (Signed)
[facility] EMERGENCY DEPARTMENT Provider Note   CSN: [id number] Arrival date & time: 02/19/21  5631     History Chief Complaint  Patient presents with  . Respiratory Distress    Brandon [name] is a 75 y.o. male with a past medical history of COPD on 4.5L at all times via nasal cannula, alcohol abuse, hypertension, GERD, prostate cancer presenting to the ED with a chief complaint of shortness of breath.  Patient admitted to hospital this month, discharged on 02/06/2021 for similar complaints.  States that this is his third time being in the hospital for similar respiratory symptoms.  States that when he gets discharged, he feels better for about 1 week but then again starts having symptoms.  The symptoms began on 02/15/2021.  He has been compliant with his home medications including his inhalers.  States that he started feeling worse today.  Denies any significant cough, chest pain.  States that his legs are always swollen and denies any worsening swelling.  Denies any hemoptysis, fever, vomiting, diarrhea. Major history is limited as patient is receiving breathing treatment at the time of my initial evaluation.  HPI     Past Medical History:  Diagnosis Date  . Abdominal aortic aneurysm without rupture ([place])   . CAD (coronary artery disease)    severe on chest CT   . Chronic alcoholism ([place])   . Chronic idiopathic constipation   . Chronic pancreatitis ([place])   . Chronic respiratory failure with hypoxia ([place])   . Essential hypertension   . GERD (gastroesophageal reflux disease)   . Hyperaldosteronism ([place])   . Malignant neoplasm of prostate ([place])   . Nicotine dependence   . Paroxysmal atrial tachycardia ([place])   . Peripheral vascular disease ([place])   . Serrated polyp of colon   . Type 2 diabetes mellitus [facility])     Patient Active Problem List   Diagnosis Date Noted  . (HFpEF) heart failure with preserved ejection fraction ([place]) 02/01/2021  . Pressure injury of  skin 01/09/2021  . Malnutrition of moderate degree 01/09/2021  . Elevated troponin 01/07/2021  . COPD with acute exacerbation ([place]) 01/07/2021  . History of CVA (cerebrovascular accident) 01/07/2021  . COPD exacerbation ([place]) 01/07/2021  . Hypomagnesemia 01/07/2021  . Essential hypertension   . Type 2 diabetes mellitus ([place])   . CAD (coronary artery disease)   . GERD (gastroesophageal reflux disease)   . Acute on chronic respiratory failure with hypoxemia ([place])   . COPD (chronic obstructive pulmonary disease) ([place])   . Tobacco abuse   . Hyponatremia   . Hyperkalemia   . Ischemic stroke [facility])     Past Surgical History:  Procedure Laterality Date  . CORONARY ATHERECTOMY N/A 01/11/2021   Procedure: CORONARY ATHERECTOMY;  Surgeon: Sherren Mocha, MD;  Location: [place] [facility];  Service: Cardiovascular;  Laterality: N/A;  . CORONARY ATHERECTOMY N/A 01/13/2021   Procedure: CORONARY ATHERECTOMY;  Surgeon: Martinique, Peter M, MD;  Location: [place] [facility];  Service: Cardiovascular;  Laterality: N/A;  . CORONARY STENT INTERVENTION N/A 01/13/2021   Procedure: CORONARY STENT INTERVENTION;  Surgeon: Martinique, Peter M, MD;  Location: [place] [facility];  Service: Cardiovascular;  Laterality: N/A;  . INTRAVASCULAR ULTRASOUND/IVUS N/A 01/13/2021   Procedure: Intravascular Ultrasound/IVUS;  Surgeon: Martinique, Peter M, MD;  Location: [place] [facility];  Service: Cardiovascular;  Laterality: N/A;  . PROSTATECTOMY  12/2012  . RIGHT/LEFT HEART CATH AND CORONARY ANGIOGRAPHY N/A 01/11/2021   Procedure: RIGHT/LEFT HEART CATH  AND CORONARY ANGIOGRAPHY;  Surgeon: Sherren Mocha, MD;  Location: [place] [facility];  Service: Cardiovascular;  Laterality: N/A;       Family History  Problem Relation Age of Onset  . Hypertension Mother   . Hypertension Father     Social History   Tobacco Use  . Smoking status: Former Smoker    Packs/day: 0.50    Years: 60.00    Pack years: 30.00    Types: Cigarettes     Quit date: 10/08/2020    Years since quitting: 0.3  . Smokeless tobacco: Never Used  Substance Use Topics  . Alcohol use: Not Currently  . Drug use: Never    Home Medications Prior to Admission medications   Medication Sig Start Date End Date Taking? Authorizing Provider  acetaminophen (TYLENOL) 325 MG tablet Take 2 tablets (650 mg total) by mouth every 6 (six) hours as needed for mild pain (or Fever >/= 101). 01/14/21   Raiford Noble Latif, DO  albuterol (VENTOLIN HFA) 108 (90 Base) MCG/ACT inhaler Inhale 2 puffs into the lungs every 4 (four) hours as needed for wheezing or shortness of breath. 11/24/19   [provider]  aspirin 81 MG EC tablet Take 1 tablet (81 mg total) by mouth daily. 10/21/20   Little Ishikawa, MD  atorvastatin (LIPITOR) 40 MG tablet Take 40 mg by mouth at bedtime.  06/11/19   [provider]  bisoprolol (ZEBETA) 5 MG tablet Take 1 tablet (5 mg total) by mouth daily. 01/14/21   Raiford Noble Latif, DO  clopidogrel (PLAVIX) 75 MG tablet Take 1 tablet (75 mg total) by mouth daily. 10/22/20   Little Ishikawa, MD  diltiazem (CARDIZEM CD) 240 MG 24 hr capsule Take 1 capsule (240 mg total) by mouth daily. 01/14/21   Raiford Noble Latif, DO  feeding supplement, GLUCERNA SHAKE, (GLUCERNA SHAKE) LIQD Take 237 mLs by mouth 2 (two) times daily between meals. 01/14/21   Raiford Noble Latif, DO  ferrous sulfate 325 (65 FE) MG tablet Take 1 tablet (325 mg total) by mouth 2 (two) times daily with a meal. Patient not taking: No sig reported 01/14/21   Raiford Noble Latif, DO  Fluticasone-Salmeterol (ADVAIR) 250-50 MCG/DOSE AEPB Inhale 1 puff into the lungs 2 (two) times daily.    [provider]  furosemide (LASIX) 40 MG tablet Take 1 tablet (40 mg total) by mouth daily. 02/07/21   Lavina Hamman, MD  insulin aspart protamine - aspart (NOVOLOG 70/30 MIX) (70-30) 100 UNIT/ML FlexPen Inject 0.2 mLs (20 Units total) into the skin 2 (two) times daily with a  meal. 01/14/21   Sheikh, Georgina Quint Latif, DO  magnesium oxide (MAG-OX) 400 (241.3 Mg) MG tablet Take 1 tablet (400 mg total) by mouth daily. Patient not taking: Reported on 02/02/2021 01/14/21   Raiford Noble Latif, DO  Magnesium Oxide 420 MG TABS Take 420 mg by mouth daily.    [provider]  metFORMIN (GLUCOPHAGE) 1000 MG tablet Take 1,000 mg by mouth 2 (two) times daily with a meal.     [provider]  Multiple Vitamin (MULTIVITAMIN WITH MINERALS) TABS tablet Take 1 tablet by mouth daily. 01/14/21   Raiford Noble Latif, DO  nortriptyline (PAMELOR) 50 MG capsule Take 50 mg by mouth at bedtime.    [provider]  Omega-3 1000 MG CAPS Take 1,000 mg by mouth in the morning and at bedtime.     [provider]  oxybutynin (DITROPAN-XL) 10 MG 24 hr  tablet Take 10 mg by mouth daily.    [provider]  OXYGEN Inhale 4.5 L/min into the lungs continuous.    [provider]  pantoprazole (PROTONIX) 40 MG tablet Take 1 tablet (40 mg total) by mouth daily. Patient taking differently: Take 40 mg by mouth daily before breakfast. 01/14/21   Raiford Noble Latif, DO  senna-docusate (SENOKOT-S) 8.6-50 MG tablet Take 1 tablet by mouth at bedtime as needed for mild constipation. Patient not taking: No sig reported 01/14/21   Raiford Noble Latif, DO  Tiotropium Bromide Monohydrate 2.5 MCG/ACT AERS Inhale 2 puffs into the lungs daily.    [provider]  vitamin B-12 1000 MCG tablet Take 1 tablet (1,000 mcg total) by mouth daily. 01/14/21   Raiford Noble Latif, DO    Allergies    Furosemide and Hydrochlorothiazide  Review of Systems   Review of Systems  Constitutional: Negative for appetite change, chills and fever.  HENT: Negative for ear pain, rhinorrhea, sneezing and sore throat.   Eyes: Negative for photophobia and visual disturbance.  Respiratory: Positive for shortness of breath and wheezing. Negative for cough and chest tightness.   Cardiovascular:  Positive for leg swelling. Negative for chest pain and palpitations.  Gastrointestinal: Negative for abdominal pain, blood in stool, constipation, diarrhea, nausea and vomiting.  Genitourinary: Negative for dysuria, hematuria and urgency.  Musculoskeletal: Negative for myalgias.  Skin: Negative for rash.  Neurological: Negative for dizziness, weakness and light-headedness.    Physical Exam Updated Vital Signs BP 126/72   Pulse (!) 111   Temp 97.9 F (36.6 C) (Oral)   Resp (!) 23   SpO2 96%   Physical Exam Vitals and nursing note reviewed.  Constitutional:      General: He is not in acute distress.    Appearance: He is well-developed and well-nourished.     Comments: Speaking in short sentences.  HENT:     Head: Normocephalic and atraumatic.     Nose: Nose normal.  Eyes:     General: No scleral icterus.       Right eye: No discharge.        Left eye: No discharge.     Extraocular Movements: EOM normal.     Conjunctiva/sclera: Conjunctivae normal.  Cardiovascular:     Rate and Rhythm: Regular rhythm. Tachycardia present.     Pulses: Intact distal pulses.     Heart sounds: Normal heart sounds. No murmur heard. No friction rub. No gallop.   Pulmonary:     Effort: Tachypnea and accessory muscle usage present. No respiratory distress.     Breath sounds: Examination of the right-middle field reveals wheezing. Examination of the left-middle field reveals wheezing. Examination of the right-lower field reveals wheezing. Examination of the left-lower field reveals wheezing. Wheezing present.  Abdominal:     General: Bowel sounds are normal. There is no distension.     Palpations: Abdomen is soft.     Tenderness: There is no abdominal tenderness. There is no guarding.  Musculoskeletal:        General: Normal range of motion.     Cervical back: Normal range of motion and neck supple.     Right lower leg: Edema present.     Left lower leg: Edema present.  Skin:    General: Skin is  warm and dry.     Findings: No rash.  Neurological:     Mental Status: He is alert.     Motor: No abnormal muscle tone.  Coordination: Coordination normal.  Psychiatric:        Mood and Affect: Mood and affect normal.     ED Results / Procedures / Treatments   Labs (all labs ordered are listed, but only abnormal results are displayed) Labs Reviewed  BASIC METABOLIC PANEL - Abnormal; Notable for the following components:      Result Value   Glucose, Bld 173 (*)    Calcium 8.8 (*)    All other components within normal limits  CBC WITH DIFFERENTIAL/PLATELET - Abnormal; Notable for the following components:   Hemoglobin 11.9 (*)    HCT 38.5 (*)    RDW 28.5 (*)    All other components within normal limits  BRAIN NATRIURETIC PEPTIDE - Abnormal; Notable for the following components:   B Natriuretic Peptide 159.7 (*)    All other components within normal limits  CBG MONITORING, ED - Abnormal; Notable for the following components:   Glucose-Capillary 109 (*)    All other components within normal limits  I-STAT VENOUS BLOOD GAS, ED - Abnormal; Notable for the following components:   Calcium, Ion 1.14 (*)    All other components within normal limits  RESP PANEL BY RT-PCR (FLU A&B, COVID) ARPGX2  BLOOD GAS, VENOUS    EKG EKG Interpretation  Date/Time:  Sunday February 19 2021 08:16:27 EST Ventricular Rate:  113 PR Interval:    QRS Duration: 116 QT Interval:  338 QTC Calculation: 464 R Axis:   98 Text Interpretation: Sinus tachycardia Multiform ventricular premature complexes Nonspecific intraventricular conduction delay Borderline repolarization abnormality Significnat respiratory artifact with baseline wander Confirmed by Nanda Quinton [id number]) on 02/19/2021 8:29:24 AM   Radiology DG Chest Port 1 View  Result Date: 02/19/2021 CLINICAL DATA:  Shortness of breath. EXAM: PORTABLE CHEST 1 VIEW COMPARISON:  02/01/2021 FINDINGS: Heart size and mediastinal contours are normal.  Aortic atherosclerosis. Small bilateral pleural effusions, unchanged. Mild diffuse increase interstitial markings identified compatible with interstitial edema. More focal airspace disease within the right midlung is identified which may represent an area of pneumonia or asymmetric edema. IMPRESSION: 1. CHF pattern. 2. More focal airspace disease within the right midlung may represent pneumonia or asymmetric edema. Electronically Signed   By: Kerby Moors M.D.   On: 02/19/2021 09:06    Procedures .Critical Care Performed by: Delia Heady, PA-C Authorized by: Delia Heady, PA-C   Critical care provider statement:    Critical care time (minutes):  35   Critical care was necessary to treat or prevent imminent or life-threatening deterioration of the following conditions:  Respiratory failure, cardiac failure and circulatory failure   Critical care was time spent personally by me on the following activities:  Development of treatment plan with patient or surrogate, obtaining history from patient or surrogate, examination of patient, evaluation of patient's response to treatment, discussions with consultants, ordering and performing treatments and interventions, ordering and review of laboratory studies, ordering and review of radiographic studies, pulse oximetry, re-evaluation of patient's condition and review of old charts   I assumed direction of critical care for this patient from another provider in my specialty: no       Medications Ordered in ED Medications  cefTRIAXone (ROCEPHIN) 1 g in sodium chloride 0.9 % 100 mL IVPB (has no administration in time range)  azithromycin (ZITHROMAX) 500 mg in sodium chloride 0.9 % 250 mL IVPB (has no administration in time range)  albuterol (PROVENTIL,VENTOLIN) solution continuous neb (15 mg/hr Nebulization Given 02/19/21 0908)  furosemide (LASIX) injection 60 mg (  60 mg Intravenous Given 02/19/21 0930)    ED Course  I have reviewed the triage vital signs  and the nursing notes.  Pertinent labs & imaging results that were available during my care of the patient were reviewed by me and considered in my medical decision making (see chart for details).  Clinical Course as of 02/19/21 0959  Sun Feb 19, 2021  0832 Pulse Rate(!): 119 [HK]  0832 Temp: 97.9 F (36.6 C) [HK]  0855 Hemoglobin(!): 11.9 [HK]  0855 pH, Ven: 7.397 [HK]  2423 Patient on BIPAP with continuous albuterol infusion. [HK]  [id number] Patient improved with BiPAP.  Tachypnea significantly improved [HK]  0929 SARS Coronavirus 2 by RT PCR: NEGATIVE [HK]    Clinical Course User Index [HK] Delia Heady, PA-C   MDM Rules/Calculators/A&P                          Abed Schar was evaluated in Emergency Department on 02/19/21 for the symptoms described in the history of present illness. He/she was evaluated in the context of the global COVID-19 pandemic, which necessitated consideration that the patient might be at risk for infection with the SARS-CoV-2 virus that causes COVID-19. Institutional protocols and algorithms that pertain to the evaluation of patients at risk for COVID-19 are in a state of rapid change based on information released by regulatory bodies including the CDC and federal and state organizations. These policies and algorithms were followed during the patient's care in the ED.  75 year old male with past medical history of CAD, GERD, COPD on 4.5 L of oxygen via nasal cannula at baseline, CHF with preserved EF, hypertension presenting to the ED for shortness of breath and respiratory distress.  Symptoms began on 02/15/2021.  Reports brief improvement after being discharged from the hospital on February 7.  However unfortunately his symptoms recurred as they have been in the past several months.  He has been using his home inhalers as well as his medications with only minimal improvement in his symptoms.  His symptoms got worse today.  Reports ongoing wheezing, shortness of breath.   No complaints of chest pain or cough.  No fevers.  He reports chronic bilateral lower extremity edema that he states is unchanged.  He arrives after EMS had given him Solu-Medrol, Atrovent and albuterol.  We transitioned him to BiPAP with continuous albuterol infusion. Chart review shows that patient has required BiPAP similarly in the past.  EKG shows sinus tachycardia.  Chest x-ray with CHF findings, ?probable pneumonia? VBG is normal.  CBC with hemoglobin at baseline.  CMP with normal creatinine and potassium level. COVID test is negative. Patient given lasix as there could be a component of CHF exacerbation causing his symptoms today. He remains on BIPAP for now with significant improvement in his respiratory rate and breath sounds. He appears more comfortable.  Due to possible pneumonia on x-ray as well as negative Covid test, will cover with azithromycin and Rocephin for CAP.  All imaging, if done today, including plain films, CT scans, and ultrasounds, independently reviewed by me, and interpretations confirmed via formal radiology reads.  Portions of this note were generated with [employer]. Dictation errors may occur despite best attempts at proofreading.  Final Clinical Impression(s) / ED Diagnoses Final diagnoses:  COPD exacerbation ([place])  Hypoxia    Rx / DC Orders ED Discharge Orders    None       Delia Heady, PA-C 02/19/21 [id number]  Margette Fast, MD 02/20/21 [phone]

## 2021-02-19 NOTE — ED Notes (Signed)
Pt requested and given water. Pt currently off bipap and on [place] per Dr Tamala Julian

## 2021-02-19 NOTE — ED Triage Notes (Signed)
Pt arrives from home via EMS. Pt reports sob x5 days. EMS reports pt was 52% on [place] at home. Pt 10 albuterol and 25 Atrovent, 125 mg solulmedrol. 18g IV established in right AC. Pt is a/o. Pt recently hospitalized for same. Pt initially tachy at 160 and hyptertensive at 160/100. Pt arrives with HR  110 and BP 146/80.

## 2021-02-19 NOTE — ED Notes (Signed)
Venous blood gas not collected, I-Stat collected and resulted, VBG not needed

## 2021-02-19 NOTE — Progress Notes (Signed)
I was called to patient room to place patient on BiPAP per MD's order, upon entering the patient's room, I noticed that patient was on NRB with the bag being flat as a pancake with no oxygen flowing into it, SATS probe was on the ear lobe and was not placed correctly with waveform being all over the place with no accurate reading, turned flow to NRB to 15 litter as how the NRB designed to have, cleaned patient's ear lobe with an alcohol pad since it had lots of skin flakes on it and placed the SAT probe on the ear correctly and the reading was 100% with a perfect waveform, then I proceeded to place the patient on the BiPAP IPAP 12 cmH2O, EPAP 5 cmH2O, rate 8 BPM, and oxygen of 40% as per the order, patient was in no apparent distress with SATS of 96% and a RR of 18 bpm, will continue to monitor patient.

## 2021-02-19 NOTE — H&P (Addendum)
History and Physical    [name] YCX:[id number] DOB: January 18, 1946 DOA: 02/19/2021  Referring MD/NP/PA: Delia Heady, PA-C PCP: Pcp, No  Patient coming from: Home via EMS  Chief Complaint: Shortness of breath  I have personally briefly reviewed patient's old medical records in [place]   HPI: Brandon [name] is a 75 y.o. male with medical history significant of HTN, CAD, recent MI with stent placement in 12/2020, COPD on  4.5 L of home O2, PVD, and DM presents with worsening shortness of breath.  History is somewhat limited as patient is currently on BiPAP.  Patient had just recently been hospitalized from 2/2-2/7 for COPD and diastolic congestive heart failure exacerbation.  Patient reported that he initially was doing well the first 3 to 4 days after getting discharged from the hospital.  However, since that time he had progressively worsening shortness of breath.  Patient reports that he has been compliant with medications and inhalers.  Symptoms got to the point which she was unable to walk even a few feet without feeling completely out of breath.  He chronically reports having lower extremity swelling that comes and goes.  Denies chest pain, cough, wheezing, fever, nausea, vomiting, or diarrhea.  At baseline he lives alone af ter losing his wife 3 years ago and his daughter lives in [place], [place]. Patient reports that he has not smoked any cigarettes since August of last year  In route with EMS patient's O2 saturations were 52% on home nasal cannula oxygen. Patient was given DuoNeb breathing treatments and Solu-Medrol 125 mg IV.  Patient was initially noted to be tachycardic to the 160s and hypertensive with blood pressure 160/110.  ED Course: Upon admission into the emergency department patient was noted to be Afebrile, pulse 107 119, respirations 16-31, blood pressures maintained, and O2 saturations as low as 85% with improvement on BiPAP to 93%.  Labs significant  for  hemoglobin 11.9, BNP 159.7, and venous pH PCO2 44.5.  COVID-19 and influenza screening were negative.  Chest x-ray significant CHF pattern with more focal airspace disease in the right midlung concerning for pneumonia or asymmetric edema.  Patient has been given 60 mg of Lasix IV, Rocephin, azithromycin, and continues to albuterol breathing treatment.  Review of Systems  Unable to perform ROS: Severe respiratory distress  Constitutional: Negative for fever.  Respiratory: Positive for shortness of breath.   Cardiovascular: Positive for leg swelling. Negative for chest pain.  Musculoskeletal: Negative for myalgias.  Psychiatric/Behavioral: Negative for substance abuse.    Past Medical History:  Diagnosis Date  . Abdominal aortic aneurysm without rupture ([place])   . CAD (coronary artery disease)    severe on chest CT   . Chronic alcoholism ([place])   . Chronic idiopathic constipation   . Chronic pancreatitis ([place])   . Chronic respiratory failure with hypoxia ([place])   . Essential hypertension   . GERD (gastroesophageal reflux disease)   . Hyperaldosteronism ([place])   . Malignant neoplasm of prostate ([place])   . Nicotine dependence   . Paroxysmal atrial tachycardia ([place])   . Peripheral vascular disease ([place])   . Serrated polyp of colon   . Type 2 diabetes mellitus ([place])     Past Surgical History:  Procedure Laterality Date  . CORONARY ATHERECTOMY N/A 01/11/2021   Procedure: CORONARY ATHERECTOMY;  Surgeon: Sherren Mocha, MD;  Location: [facility];  Service: Cardiovascular;  Laterality: N/A;  . CORONARY ATHERECTOMY N/A 01/13/2021   Procedure: CORONARY ATHERECTOMY;  Surgeon: Martinique,  Ander Slade, MD;  Location: [facility];  Service: Cardiovascular;  Laterality: N/A;  . CORONARY STENT INTERVENTION N/A 01/13/2021   Procedure: CORONARY STENT INTERVENTION;  Surgeon: Martinique, Peter M, MD;  Location: [facility];  Service: Cardiovascular;  Laterality: N/A;  . INTRAVASCULAR  ULTRASOUND/IVUS N/A 01/13/2021   Procedure: Intravascular Ultrasound/IVUS;  Surgeon: Martinique, Peter M, MD;  Location: [facility];  Service: Cardiovascular;  Laterality: N/A;  . PROSTATECTOMY  12/2012  . RIGHT/LEFT HEART CATH AND CORONARY ANGIOGRAPHY N/A 01/11/2021   Procedure: RIGHT/LEFT HEART CATH AND CORONARY ANGIOGRAPHY;  Surgeon: Sherren Mocha, MD;  Location: [facility];  Service: Cardiovascular;  Laterality: N/A;     reports that he quit smoking about 4 months ago. His smoking use included cigarettes. He has a 30.00 pack-year smoking history. He has never used smokeless tobacco. He reports previous alcohol use. He reports that he does not use drugs.  Allergies  Allergen Reactions  . Furosemide Swelling and Other (See Comments)    Cramping, also (patient disputes this in 2022)  . Hydrochlorothiazide Other (See Comments)    Hypokalemia (patient disputes this in 2022)    Family History  Problem Relation Age of Onset  . Hypertension Mother   . Hypertension Father     Prior to Admission medications   Medication Sig Start Date End Date Taking? Authorizing Provider  acetaminophen (TYLENOL) 325 MG tablet Take 2 tablets (650 mg total) by mouth every 6 (six) hours as needed for mild pain (or Fever >/= 101). 01/14/21   Raiford Noble Latif, DO  albuterol (VENTOLIN HFA) 108 (90 Base) MCG/ACT inhaler Inhale 2 puffs into the lungs every 4 (four) hours as needed for wheezing or shortness of breath. 11/24/19   [provider]  aspirin 81 MG EC tablet Take 1 tablet (81 mg total) by mouth daily. 10/21/20   Little Ishikawa, MD  atorvastatin (LIPITOR) 40 MG tablet Take 40 mg by mouth at bedtime.  06/11/19   [provider]  bisoprolol (ZEBETA) 5 MG tablet Take 1 tablet (5 mg total) by mouth daily. 01/14/21   Raiford Noble Latif, DO  clopidogrel (PLAVIX) 75 MG tablet Take 1 tablet (75 mg total) by mouth daily. 10/22/20   Little Ishikawa, MD  diltiazem (CARDIZEM  CD) 240 MG 24 hr capsule Take 1 capsule (240 mg total) by mouth daily. 01/14/21   Raiford Noble Latif, DO  feeding supplement, GLUCERNA SHAKE, (GLUCERNA SHAKE) LIQD Take 237 mLs by mouth 2 (two) times daily between meals. 01/14/21   Raiford Noble Latif, DO  ferrous sulfate 325 (65 FE) MG tablet Take 1 tablet (325 mg total) by mouth 2 (two) times daily with a meal. Patient not taking: No sig reported 01/14/21   Raiford Noble Latif, DO  Fluticasone-Salmeterol (ADVAIR) 250-50 MCG/DOSE AEPB Inhale 1 puff into the lungs 2 (two) times daily.    [provider]  furosemide (LASIX) 40 MG tablet Take 1 tablet (40 mg total) by mouth daily. 02/07/21   Lavina Hamman, MD  insulin aspart protamine - aspart (NOVOLOG 70/30 MIX) (70-30) 100 UNIT/ML FlexPen Inject 0.2 mLs (20 Units total) into the skin 2 (two) times daily with a meal. 01/14/21   Sheikh, Georgina Quint Latif, DO  magnesium oxide (MAG-OX) 400 (241.3 Mg) MG tablet Take 1 tablet (400 mg total) by mouth daily. Patient not taking: Reported on 02/02/2021 01/14/21   Raiford Noble Latif, DO  Magnesium Oxide 420 MG TABS Take 420 mg by  mouth daily.    [provider]  metFORMIN (GLUCOPHAGE) 1000 MG tablet Take 1,000 mg by mouth 2 (two) times daily with a meal.     [provider]  Multiple Vitamin (MULTIVITAMIN WITH MINERALS) TABS tablet Take 1 tablet by mouth daily. 01/14/21   Raiford Noble Latif, DO  nortriptyline (PAMELOR) 50 MG capsule Take 50 mg by mouth at bedtime.    [provider]  Omega-3 1000 MG CAPS Take 1,000 mg by mouth in the morning and at bedtime.     [provider]  oxybutynin (DITROPAN-XL) 10 MG 24 hr tablet Take 10 mg by mouth daily.    [provider]  OXYGEN Inhale 4.5 L/min into the lungs continuous.    [provider]  pantoprazole (PROTONIX) 40 MG tablet Take 1 tablet (40 mg total) by mouth daily. Patient taking differently: Take 40 mg by mouth daily before breakfast. 01/14/21   Raiford Noble Latif, DO  senna-docusate (SENOKOT-S) 8.6-50 MG tablet Take 1 tablet by mouth at bedtime as needed for mild constipation. Patient not taking: No sig reported 01/14/21   Raiford Noble Latif, DO  Tiotropium Bromide Monohydrate 2.5 MCG/ACT AERS Inhale 2 puffs into the lungs daily.    [provider]  vitamin B-12 1000 MCG tablet Take 1 tablet (1,000 mcg total) by mouth daily. 01/14/21   Kerney Elbe, DO    Physical Exam:  Constitutional: Disheveled elderly male was able to follow Vitals:   02/19/21 0909 02/19/21 0930 02/19/21 1000 02/19/21 1019  BP:  126/72 135/69   Pulse:  (!) 111 (!) 116   Resp:  (!) 23 (!) 31   Temp:      TempSrc:      SpO2: 96% 96% 91% 93%   Eyes: PERRL, lids and conjunctivae normal ENMT: Mucous membranes are dry. Posterior pharynx clear of any exudate or lesions.  Neck: normal, supple, no masses, no thyromegaly Respiratory: Tachypneic with expiratory wheeze appreciated.  Patient currently on BiPAP and only able to talk in short sentences. Cardiovascular: Tachycardic, no murmurs / rubs / gallops.  At least +1 pitting lower extremity edema. 2+ pedal pulses. No carotid bruits.  Abdomen: no tenderness, no masses palpated. No hepatosplenomegaly. Bowel sounds positive.  Musculoskeletal: Clubbing present.  No Skin: Very dry skin flaking of skin and signs of venous stasis without increased warmth Neurologic: CN 2-12 grossly intact. Sensation intact, DTR normal. Strength 5/5 in all 4.  Psychiatric: Normal judgment and insight. Alert and oriented x 3. Normal mood.     Labs on Admission: I have personally reviewed following labs and imaging studies  CBC: Recent Labs  Lab 02/19/21 0833 02/19/21 0842  WBC 10.0  --   NEUTROABS 7.4  --   HGB 11.9* 13.3  HCT 38.5* 39.0  MCV 85.2  --   PLT 351  --    Basic Metabolic Panel: Recent Labs  Lab 02/19/21 0833 02/19/21 0842  NA 138 140  K 3.7 3.8  CL 101  --   CO2 24  --   GLUCOSE 173*  --   BUN  16  --   CREATININE 1.14  --   CALCIUM 8.8*  --    GFR: CrCl cannot be calculated (Unknown ideal weight.). Liver Function Tests: No results for input(s): AST, ALT, ALKPHOS, BILITOT, PROT, ALBUMIN in the last 168 hours. No results for input(s): LIPASE, AMYLASE in the last 168 hours. No results for input(s): AMMONIA in the last 168 hours. Coagulation Profile:  No results for input(s): INR, PROTIME in the last 168 hours. Cardiac Enzymes: No results for input(s): CKTOTAL, CKMB, CKMBINDEX, TROPONINI in the last 168 hours. BNP (last 3 results) No results for input(s): PROBNP in the last 8760 hours. HbA1C: No results for input(s): HGBA1C in the last 72 hours. CBG: Recent Labs  Lab 02/19/21 0903  GLUCAP 109*   Lipid Profile: No results for input(s): CHOL, HDL, LDLCALC, TRIG, CHOLHDL, LDLDIRECT in the last 72 hours. Thyroid Function Tests: No results for input(s): TSH, T4TOTAL, FREET4, T3FREE, THYROIDAB in the last 72 hours. Anemia Panel: No results for input(s): VITAMINB12, FOLATE, FERRITIN, TIBC, IRON, RETICCTPCT in the last 72 hours. Urine analysis:    Component Value Date/Time   COLORURINE YELLOW 01/07/2021 2010   APPEARANCEUR CLEAR 01/07/2021 2010   LABSPEC 1.006 01/07/2021 2010   PHURINE 5.0 01/07/2021 2010   GLUCOSEU NEGATIVE 01/07/2021 2010   HGBUR NEGATIVE 01/07/2021 2010   [place] NEGATIVE 01/07/2021 2010   [place] NEGATIVE 01/07/2021 2010   PROTEINUR NEGATIVE 01/07/2021 2010   NITRITE NEGATIVE 01/07/2021 2010   LEUKOCYTESUR NEGATIVE 01/07/2021 2010   Sepsis Labs: Recent Results (from the past 240 hour(s))  Resp Panel by RT-PCR (Flu A&B, Covid) Nasopharyngeal Swab     Status: None   Collection Time: 02/19/21  8:18 AM   Specimen: Nasopharyngeal Swab; Nasopharyngeal(NP) swabs in vial transport medium  Result Value Ref Range Status   SARS Coronavirus 2 by RT PCR NEGATIVE NEGATIVE Final    Comment: (NOTE) SARS-CoV-2 target nucleic acids are NOT DETECTED.  The  SARS-CoV-2 RNA is generally detectable in upper respiratory specimens during the acute phase of infection. The lowest concentration of SARS-CoV-2 viral copies this assay can detect is 138 copies/mL. A negative result does not preclude SARS-Cov-2 infection and should not be used as the sole basis for treatment or other patient management decisions. A negative result may occur with  improper specimen collection/handling, submission of specimen other than nasopharyngeal swab, presence of viral mutation(s) within the areas targeted by this assay, and inadequate number of viral copies(<138 copies/mL). A negative result must be combined with clinical observations, patient history, and epidemiological information. The expected result is Negative.  Fact Sheet for Patients:  EntrepreneurPulse.com.au  Fact Sheet for Healthcare Providers:  IncredibleEmployment.be  This test is no t yet approved or cleared by the [place] FDA and  has been authorized for detection and/or diagnosis of SARS-CoV-2 by FDA under an Emergency Use Authorization (EUA). This EUA will remain  in effect (meaning this test can be used) for the duration of the COVID-19 declaration under Section 564(b)(1) of the Act, 21 U.S.C.section 360bbb-3(b)(1), unless the authorization is terminated  or revoked sooner.       Influenza A by PCR NEGATIVE NEGATIVE Final   Influenza B by PCR NEGATIVE NEGATIVE Final    Comment: (NOTE) The Xpert Xpress SARS-CoV-2/FLU/RSV plus assay is intended as an aid in the diagnosis of influenza from Nasopharyngeal swab specimens and should not be used as a sole basis for treatment. Nasal washings and aspirates are unacceptable for Xpert Xpress SARS-CoV-2/FLU/RSV testing.  Fact Sheet for Patients: EntrepreneurPulse.com.au  Fact Sheet for Healthcare Providers: IncredibleEmployment.be  This test is not yet approved or  cleared by the [place] FDA and has been authorized for detection and/or diagnosis of SARS-CoV-2 by FDA under an Emergency Use Authorization (EUA). This EUA will remain in effect (meaning this test can be used) for the duration of the COVID-19 declaration under Section 564(b)(1) of the Act,  21 U.S.C. section 360bbb-3(b)(1), unless the authorization is terminated or revoked.  Performed at [facility], [place] [address] [place], [place] [address]      Radiological Exams on Admission: DG Chest Port 1 View  Result Date: 02/19/2021 CLINICAL DATA:  Shortness of breath. EXAM: PORTABLE CHEST 1 VIEW COMPARISON:  02/01/2021 FINDINGS: Heart size and mediastinal contours are normal. Aortic atherosclerosis. Small bilateral pleural effusions, unchanged. Mild diffuse increase interstitial markings identified compatible with interstitial edema. More focal airspace disease within the right midlung is identified which may represent an area of pneumonia or asymmetric edema. IMPRESSION: 1. CHF pattern. 2. More focal airspace disease within the right midlung may represent pneumonia or asymmetric edema. Electronically Signed   By: Kerby Moors M.D.   On: 02/19/2021 09:06    EKG: Independently reviewed.  Patient also sinus tachycardia 104 bpm with intermittent PVCs  Assessment/Plan Acute on chronic respiratory failure with hypoxia secondary to COPD exacerbation: Patient presented with complaints of shortness of breath over the last 5 days with O2 saturations initially noted to be around 52% on home 4.5 L of nasal cannula oxygen.  Physical exam patient with wheezing appreciated currently on BiPAP.  Chest x-ray significant for CHF pattern with more focal airspace disease noted in the right midlung concerning for pneumonia.  COVID-19 screening was negative.  Suspect symptoms are likely multifactorial in the setting of CHF and COPD exacerbation with -Admit to a progressive bed -COPD and heart failure order  set utilized -BiPAP as needed -DuoNebs 4 times daily and albuterol as needed -Brovana and budesonide nebs substitution for Advair -Solu-Medrol IV and transition to p.o. prednisone -PT/OT to eval and treat -Transitions of care consult, as daughter suggest patient needs to be placed in a nursing facility   Diastolic congestive heart failure: Acute on chronic.  Patient presents with acute shortness of breath BNP elevated at 159.7 which is a slightly higher than previous admission.  Chest x-ray noting edema last echocardiogram revealed EF of 60 to 65% from 01/09/2021.  He reports compliance with Lasix 40 mg daily.  Patient had been given Lasix 60 mg IV in the ER. Question if recurrence of CHF secondary to compliance with medications -Strict I&Os and daily weights -Lasix 40 mg IV twice daily -Recheck echocardiogram -Adjust IV diuresis as needed  Suspected healthcare associated pneumonia: On admission patient noted to have signs of a right midlung opacity concerning for pneumonia. -Add on procalcitonin -Empiric antibiotics of Rocephin and azithromycin.    CAD: Patient currently denies having any complaints of chest pain.  Recent non-STEMI in January with PCI to the proximal to mid LAD with orbital arthrectomy and placement of DES. -Follow-up troponin -Continue Plavix, aspirin, beta-blocker, and statin  Essential hypertension: Blood pressures currently stable.  Home medication regimen includes bisoprolol 5 mg daily, Cardizem 240 mg daily, and furosemide 40 mg daily -Continue bisprolol and Cardizem  Paroxysmal atrial tachycardia: Heart rates currently elevated into the 120s.  No reports of atrial fibrillation and anticoagulation was not previously advised by cardiology. -Continue rate controlling medications as seen above  Diabetes mellitus type 2, controlled: Last hemoglobin A1c 6.4.  Home regimen includes Metformin 1000 mg twice daily, NovoLog 70/30 mix 20 units twice daily -Hypoglycemic  protocol -Hold Metformin -Continue NovoLog 70/30 insulin 20 units twice daily  -Check CBGs before every meal and at bedtime -Adjust regimen as needed  Normocytic anemia: Hemoglobin 11.9 which appears improved from previous hospitalization. -Continue to monitor  Hyperlipidemia -Continue atorvastatin 40 mg daily  History of CVA  Overactive bladder -Continue Ditropan  DVT prophylaxis: lovenox   Code Status:Full  Family Communication: Daughter updated over the phone Disposition Plan: To be determined Consults called: None Admission status: Inpatient, require more than 2 midnight stay  Norval Morton MD [facility]   If 7PM-7AM, please contact night-coverage   02/19/2021, 10:45 AM

## 2021-02-19 NOTE — ED Notes (Signed)
Pt up in bed eating lunch.

## 2021-02-20 ENCOUNTER — Other Ambulatory Visit (HOSPITAL_COMMUNITY): Payer: Medicare HMO

## 2021-02-20 DIAGNOSIS — I10 Essential (primary) hypertension: Secondary | ICD-10-CM

## 2021-02-20 DIAGNOSIS — Z8673 Personal history of transient ischemic attack (TIA), and cerebral infarction without residual deficits: Secondary | ICD-10-CM

## 2021-02-20 DIAGNOSIS — I5033 Acute on chronic diastolic (congestive) heart failure: Secondary | ICD-10-CM

## 2021-02-20 DIAGNOSIS — J441 Chronic obstructive pulmonary disease with (acute) exacerbation: Secondary | ICD-10-CM

## 2021-02-20 DIAGNOSIS — E1159 Type 2 diabetes mellitus with other circulatory complications: Secondary | ICD-10-CM

## 2021-02-20 LAB — GLUCOSE, CAPILLARY
Glucose-Capillary: 257 mg/dL — ABNORMAL HIGH (ref 70–99)
Glucose-Capillary: 245 mg/dL — ABNORMAL HIGH (ref 70–99)
Glucose-Capillary: 199 mg/dL — ABNORMAL HIGH (ref 70–99)
Glucose-Capillary: 215 mg/dL — ABNORMAL HIGH (ref 70–99)

## 2021-02-20 LAB — BASIC METABOLIC PANEL
Anion gap: 11 (ref 5–15)
BUN: 20 mg/dL (ref 8–23)
CO2: 26 mmol/L (ref 22–32)
Calcium: 8.7 mg/dL — ABNORMAL LOW (ref 8.9–10.3)
Chloride: 100 mmol/L (ref 98–111)
Creatinine, Ser: 1.13 mg/dL (ref 0.61–1.24)
GFR, Estimated: >60 mL/min (ref >60)
Glucose, Bld: 308 mg/dL — ABNORMAL HIGH (ref 70–99)
Potassium: 3.4 mmol/L — ABNORMAL LOW (ref 3.5–5.1)
Sodium: 137 mmol/L (ref 135–145)

## 2021-02-20 LAB — MAGNESIUM: Magnesium: 1.7 mg/dL (ref 1.7–2.4)

## 2021-02-20 MED ORDER — IPRATROPIUM-ALBUTEROL 0.5-2.5 (3) MG/3ML IN SOLN
3.0000 mL | Freq: Two times a day (BID) | RESPIRATORY_TRACT | Status: DC
  Administered 2021-02-20 – 2021-02-24 (×8): 3 mL via RESPIRATORY_TRACT
  Filled 2021-02-20 (×8): qty 3

## 2021-02-20 MED ORDER — INSULIN ASPART 100 UNIT/ML ~~LOC~~ SOLN
0.0000 [IU] | Freq: Three times a day (TID) | SUBCUTANEOUS | Status: DC
  Administered 2021-02-20: 3 [IU] via SUBCUTANEOUS
  Administered 2021-02-20 – 2021-02-21 (×2): 5 [IU] via SUBCUTANEOUS
  Administered 2021-02-21 (×2): 3 [IU] via SUBCUTANEOUS
  Administered 2021-02-22: 2 [IU] via SUBCUTANEOUS
  Administered 2021-02-22: 5 [IU] via SUBCUTANEOUS
  Administered 2021-02-22 – 2021-02-23 (×2): 3 [IU] via SUBCUTANEOUS
  Administered 2021-02-23: 2 [IU] via SUBCUTANEOUS
  Administered 2021-02-23 – 2021-02-24 (×3): 5 [IU] via SUBCUTANEOUS

## 2021-02-20 MED ORDER — INSULIN ASPART 100 UNIT/ML ~~LOC~~ SOLN
0.0000 [IU] | Freq: Every day | SUBCUTANEOUS | Status: DC
  Administered 2021-02-20 – 2021-02-21 (×2): 2 [IU] via SUBCUTANEOUS
  Administered 2021-02-22: 4 [IU] via SUBCUTANEOUS
  Administered 2021-02-23: 3 [IU] via SUBCUTANEOUS

## 2021-02-20 MED ORDER — ASPIRIN EC 81 MG PO TBEC
81.0000 mg | DELAYED_RELEASE_TABLET | Freq: Every day | ORAL | Status: DC
  Administered 2021-02-20 – 2021-02-24 (×5): 81 mg via ORAL
  Filled 2021-02-20 (×5): qty 1

## 2021-02-20 MED ORDER — POTASSIUM CHLORIDE CRYS ER 20 MEQ PO TBCR
40.0000 meq | EXTENDED_RELEASE_TABLET | Freq: Once | ORAL | Status: AC
  Administered 2021-02-20: 40 meq via ORAL
  Filled 2021-02-20: qty 2

## 2021-02-20 MED ORDER — ADULT MULTIVITAMIN W/MINERALS CH
1.0000 | ORAL_TABLET | Freq: Every day | ORAL | Status: DC
  Administered 2021-02-20 – 2021-02-24 (×5): 1 via ORAL
  Filled 2021-02-20 (×5): qty 1

## 2021-02-20 MED ORDER — PROSOURCE PLUS PO LIQD
30.0000 mL | Freq: Two times a day (BID) | ORAL | Status: DC
  Administered 2021-02-21 – 2021-02-23 (×2): 30 mL via ORAL
  Filled 2021-02-20 (×3): qty 30

## 2021-02-20 NOTE — Plan of Care (Signed)

## 2021-02-20 NOTE — Progress Notes (Addendum)
Initial Nutrition Assessment  DOCUMENTATION CODES:   Severe malnutrition in context of chronic illness  INTERVENTION:   -Glucerna Therapeutic Nutrition Shake po BID, each supplement provides 220 kcal, 10 grams protein   -PROsource Plus po BID, each supplement provides 100 kcal, 15 grams protein  -MVI with minerals po, daily   NUTRITION DIAGNOSIS:   Severe Malnutrition related to chronic illness (COPD, CHF) as evidenced by moderate muscle depletion,severe muscle depletion,moderate fat depletion,energy intake < or equal to 50% for > or equal to 1 month.  GOAL:   Patient will meet greater than or equal to 90% of their needs  MONITOR:   PO intake,Supplement acceptance,Skin,Labs,Weight trends,I & O's  REASON FOR ASSESSMENT:   Consult Assessment of nutrition requirement/status  ASSESSMENT:   57 YOM admitted for ARDS with hypoxia. PMH of CHF, HTN, abdominal aortic aneurysm w/o rupture, ETOH abuse, chronic idiopathic constipation, chronic pancreatitis, COPD on 4.5 L oxygen, PVD, T2DM, CAD s/p non-STEMI, recent MI s/p stent placement 12/2020.  Pt discussed with intern that he has had a good appetite PTA and since being admitted to the hospital. Per pt's chart, 50% of meal was completed on 02/21. Pt mentioned to intern that at home he typically consumes:  Breakfast: cereal and milk or a Consulting civil engineer breakfast bowl  Lunch: Glucerna Nutrition Shake  Dinner: [employer] TV dinners   Pt discussed with intern and RD that he consumed microwave foods due to not being able to cook for himself due to his oxygen usage and shortness of breath. Intern educated pt on recommended daily sodium intake. Intern discussed with pt how to read food labels to look for sodium content. RD and intern discussed with pt lower sodium foods such as chicken and frozen vegetables rather than TV dinners to help follow a lower sodium diet. RD discussed with pt decreasing overall sodium intake to maintain fluid balance,  however noted that it was of most importance to continue meeting nutritional goals. Pt was receptive and mentioned that he could try to make changes.   Pt denied any nausea, vomiting, diarrhea or abdominal pain. However, pt did note that he has struggled with constipation which he mentioned is made worse by his iron supplement. Pt discussed with intern that his last bowel movement was over a week ago.   Pt denied any chewing or swallowing difficulties. Pt did wear dentures, but mentioned that they fit well and did not cause any chewing problems.   Pt mentioned that he typically uses a walker or a cane at home. However, he did mention that he is supposed to get a wheelchair soon.   Pt's weights reviewed. No admission weight noted. However, pt's weights look to be fairly stable since 01/14/21. Pt discussed with intern that his UBW is 163#. He mentioned that in September 2021, he weighed 200#, however pt mentioned that this was likely due to fluid. Pt did discuss with intern that he felt he had consistently lost weight during his past hospital stays.  Current weight: 173#  Meds Reviewed: Glucerna BID, Lasix (40 mg, BID), Mag-Ox (400 mg, daily), Deltasone (40 mg, 02/22-02/26 daily)  Labs Reviewed: Glucose (257 mg/dL), Potassium (3.4 mmol/L), Calcium (8.7 mg/dL)  NUTRITION - FOCUSED PHYSICAL EXAM:  Flowsheet Row Most Recent Value  Orbital Region Moderate depletion  Upper Arm Region Moderate depletion  Thoracic and Lumbar Region Moderate depletion  Buccal Region Moderate depletion  Temple Region Moderate depletion  Clavicle Bone Region Severe depletion  Clavicle and Acromion Bone Region Severe depletion  Scapular Bone Region Moderate depletion  Dorsal Hand Moderate depletion  Patellar Region Moderate depletion  Anterior Thigh Region Severe depletion  Posterior Calf Region Moderate depletion  Edema (RD Assessment) Mild  Hair Reviewed  Eyes Reviewed  Mouth Reviewed  Skin Reviewed  Nails  Reviewed       Diet Order:   Diet Order            Diet heart healthy/carb modified Room service appropriate? Yes; Fluid consistency: Thin  Diet effective now                 EDUCATION NEEDS:   Education needs have been addressed (Low Sodium Diet Education)  Skin:  Skin Assessment: Skin Integrity Issues: Skin Integrity Issues:: Stage II Stage II: L ear  Last BM:  02/19/21  Height:   Ht Readings from Last 1 Encounters:  01/07/21 6\' 6"  (1.981 m)    Weight:   Wt Readings from Last 1 Encounters:  02/20/21 78.6 kg    Ideal Body Weight:  97.3 kg  BMI:  Body mass index is 20.02 kg/m.  Estimated Nutritional Needs:   Kcal:  1031-2811  Protein:  115-130 g  Fluid:  >/= 2.5 L    Salvadore Oxford, Dietetic Intern 02/20/2021 4:09 PM

## 2021-02-20 NOTE — Progress Notes (Signed)
Inpatient Diabetes Program Recommendations  AACE/ADA: New Consensus Statement on Inpatient Glycemic Control (2015)  Target Ranges:  Prepandial:   less than 140 mg/dL      Peak postprandial:   less than 180 mg/dL (1-2 hours)      Critically ill patients:  140 - 180 mg/dL   Lab Results  Component Value Date   GLUCAP 257 (H) 02/20/2021   HGBA1C 6.4 (H) 01/07/2021    Review of Glycemic Control Results for Brandon [name], Brandon [name] (MRN [id number]) as of 02/20/2021 09:22  Ref. Range 02/19/2021 09:03 02/19/2021 16:53 02/19/2021 18:22 02/19/2021 21:34 02/20/2021 06:40  Glucose-Capillary Latest Ref Range: 70 - 99 mg/dL 109 (H) 301 (H) 322 (H) 366 (H) 257 (H)   Diabetes history: DM2 Outpatient Diabetes medications: 70/30 20 units bid + Metformin 1 gm bid Current orders for Inpatient glycemic control: Novolog 70/30 20 units bid + Solumedrol 60 mg q 6 hrs then transition to Prednisone 40 mg qd starting in am  Inpatient Diabetes Program Recommendations:   -Add Novolog 0-15 units correction tid + hs 0-5 units Secure chat sent to Dr. Cruzita Lederer.  Thank you, Nani Gasser. Bennett Ram, RN, MSN, CDE  Diabetes Coordinator Inpatient Glycemic Control Team Team Pager [phone] (8am-5pm) 02/20/2021 9:25 AM

## 2021-02-20 NOTE — Progress Notes (Signed)
PROGRESS NOTE  Brandon [name] RJJ:[id number] DOB: 04/30/1946 DOA: 02/19/2021 PCP: Pcp, No   LOS: 1 day   Brief Narrative / Interim history: 75 year old male with hypertension, CAD, recent MI with stent placement January 2022, COPD on 4.5 L home oxygen, peripheral vascular disease, diabetes who comes in with shortness of breath.  Patient was hospitalized 2/2-2/7 for COPD and diastolic CHF, was discharged and did very well for the first few days however started to be more more short of breath had to come back to the hospital.  He denies any weight gain/fluid overload, weight himself daily.  He reports compliance with medication.  States that he is not really watching his sodium intake.  He used to smoke quit last year in August.  He needed to be on BiPAP in the ED, currently on 10 L.  He was given Lasix along with antibiotics  Subjective / 24h Interval events: Still feeling short of breath this morning, frustrated by the fact that to come back to the hospital so quickly.  No chest pain, no abdominal pain, no nausea or vomiting.  Assessment & Plan: Principal Problem Acute on chronic hypoxic respiratory failure due to acute pulmonary edema in the setting of acute on chronic diastolic CHF and COPD exacerbation, lobar pneumonia -Patient remains hypoxic on 10 L high flow this morning. -Renal function is stable, continue furosemide -he was placed on antibiotics, steroids, nebulizers continue.  Procalcitonin is negative, repeat chest x-ray tomorrow and if pneumonia pattern resolved with diuresis discontinue antibiotics -Wean off oxygen as tolerated  Active Problems Coronary artery disease -Recent non-STEMI in January with PCI to the proximal to mid LAD, orbital atherectomy and placement of drug-eluting stent stop troponin negative -Continue Plavix, aspirin, statin, beta-blocker, diltiazem -A repeat 2D echo pending  Essential hypertension -Blood pressure stable on current  regimen  Hyperlipidemia -continue statin  Paroxysmal atrial tachycardia -No reports of A. fib, anticoagulation was not advised by cardiology -Continue beta-blockers, calcium channel blockers  History of CVA -Monitor platelets  Tobacco abuse, in remission -He quit last year  Hypokalemia -Due to diuresis, replete  Type 2 diabetes mellitus, controlled, A1c 6.4 -Continue home insulin   Scheduled Meds: . arformoterol  15 mcg Nebulization BID  . aspirin EC  81 mg Oral Daily  . atorvastatin  40 mg Oral QHS  . bisoprolol  5 mg Oral Daily  . budesonide (PULMICORT) nebulizer solution  0.5 mg Nebulization BID  . clopidogrel  75 mg Oral Daily  . diltiazem  240 mg Oral Daily  . enoxaparin (LOVENOX) injection  40 mg Subcutaneous Daily  . feeding supplement (GLUCERNA SHAKE)  237 mL Oral BID BM  . furosemide  40 mg Intravenous BID  . insulin aspart protamine- aspart  20 Units Subcutaneous BID WC  . ipratropium-albuterol  3 mL Nebulization BID  . magnesium oxide  400 mg Oral Daily  . methylPREDNISolone (SOLU-MEDROL) injection  60 mg Intravenous Q6H   Followed by  . [START ON 02/21/2021] predniSONE  40 mg Oral Q breakfast  . nortriptyline  50 mg Oral QHS  . oxybutynin  10 mg Oral Daily  . pantoprazole  40 mg Oral QAC breakfast  . sodium chloride flush  3 mL Intravenous Q12H   Continuous Infusions: . sodium chloride    . azithromycin    . cefTRIAXone (ROCEPHIN)  IV     PRN Meds:.sodium chloride, acetaminophen, ondansetron (ZOFRAN) IV, sodium chloride flush  Diet Orders (From admission, onward)    Start  Ordered   02/19/21 1241  Diet heart healthy/carb modified Room service appropriate? Yes; Fluid consistency: Thin  Diet effective now       Question Answer Comment  Diet-HS Snack? Nothing   Room service appropriate? Yes   Fluid consistency: Thin      02/19/21 1240          DVT prophylaxis: enoxaparin (LOVENOX) injection 40 mg Start: 02/19/21 1200     Code Status: Full  Code  Family Communication: no family at bedside   Status is: Inpatient  Remains inpatient appropriate because:Inpatient level of care appropriate due to severity of illness   Dispo: The patient is from: Home              Anticipated d/c is to: Home              Anticipated d/c date is: 3 days              Patient currently is not medically stable to d/c.   Difficult to place patient No  Level of care: Progressive  Consultants:  None   Procedures:  2D echo: pending  Microbiology  None   Antimicrobials: Ceftriaxone / Azithromycin 2/20 >>    Objective: Vitals:   02/20/21 0422 02/20/21 0500 02/20/21 0745 02/20/21 0749  BP: 134/81 118/75    Pulse: (!) 105 96    Resp: 15 15    Temp: (!) 97.4 F (36.3 C)     TempSrc: Oral     SpO2: 97% 98% 100% 100%  Weight:  78.6 kg      Intake/Output Summary (Last 24 hours) at 02/20/2021 0810 Last data filed at 02/19/2021 2200 Gross per 24 hour  Intake 120 ml  Output -  Net 120 ml   Filed Weights   02/20/21 0500  Weight: 78.6 kg    Examination:  Constitutional: No significant distress but sitting upright to breathe better Eyes: no scleral icterus ENMT: Mucous membranes are moist.  Neck: normal, supple Respiratory: Bibasilar crackles, faint end expiratory wheezing present.  Tachypneic Cardiovascular: Regular rate and rhythm, tachycardic.  No appreciable murmurs.  Trace edema Abdomen: non distended, no tenderness. Bowel sounds positive.  Musculoskeletal: no clubbing / cyanosis.  Skin: no rashes Neurologic: CN 2-12 grossly intact. Strength 5/5 in all 4.  Psychiatric: Normal judgment and insight. Alert and oriented x 3. Normal mood.    Data Reviewed: I have independently reviewed following labs and imaging studies   CBC: Recent Labs  Lab 02/19/21 0833 02/19/21 0842  WBC 10.0  --   NEUTROABS 7.4  --   HGB 11.9* 13.3  HCT 38.5* 39.0  MCV 85.2  --   PLT 351  --    Basic Metabolic Panel: Recent Labs  Lab  02/19/21 0833 02/19/21 0842 02/20/21 0048  NA 138 140 137  K 3.7 3.8 3.4*  CL 101  --  100  CO2 24  --  26  GLUCOSE 173*  --  308*  BUN 16  --  20  CREATININE 1.14  --  1.13  CALCIUM 8.8*  --  8.7*  MG  --   --  1.7   Liver Function Tests: No results for input(s): AST, ALT, ALKPHOS, BILITOT, PROT, ALBUMIN in the last 168 hours. Coagulation Profile: No results for input(s): INR, PROTIME in the last 168 hours. HbA1C: No results for input(s): HGBA1C in the last 72 hours. CBG: Recent Labs  Lab 02/19/21 [phone] 02/19/21 1653 02/19/21 1822 02/19/21 2134 02/20/21 0272  GLUCAP 109* 301* 322* 366* 257*    Recent Results (from the past 240 hour(s))  Resp Panel by RT-PCR (Flu A&B, Covid) Nasopharyngeal Swab     Status: None   Collection Time: 02/19/21  8:18 AM   Specimen: Nasopharyngeal Swab; Nasopharyngeal(NP) swabs in vial transport medium  Result Value Ref Range Status   SARS Coronavirus 2 by RT PCR NEGATIVE NEGATIVE Final    Comment: (NOTE) SARS-CoV-2 target nucleic acids are NOT DETECTED.  The SARS-CoV-2 RNA is generally detectable in upper respiratory specimens during the acute phase of infection. The lowest concentration of SARS-CoV-2 viral copies this assay can detect is 138 copies/mL. A negative result does not preclude SARS-Cov-2 infection and should not be used as the sole basis for treatment or other patient management decisions. A negative result may occur with  improper specimen collection/handling, submission of specimen other than nasopharyngeal swab, presence of viral mutation(s) within the areas targeted by this assay, and inadequate number of viral copies(<138 copies/mL). A negative result must be combined with clinical observations, patient history, and epidemiological information. The expected result is Negative.  Fact Sheet for Patients:  EntrepreneurPulse.com.au  Fact Sheet for Healthcare Providers:   IncredibleEmployment.be  This test is no t yet approved or cleared by the [place] FDA and  has been authorized for detection and/or diagnosis of SARS-CoV-2 by FDA under an Emergency Use Authorization (EUA). This EUA will remain  in effect (meaning this test can be used) for the duration of the COVID-19 declaration under Section 564(b)(1) of the Act, 21 U.S.C.section 360bbb-3(b)(1), unless the authorization is terminated  or revoked sooner.       Influenza A by PCR NEGATIVE NEGATIVE Final   Influenza B by PCR NEGATIVE NEGATIVE Final    Comment: (NOTE) The Xpert Xpress SARS-CoV-2/FLU/RSV plus assay is intended as an aid in the diagnosis of influenza from Nasopharyngeal swab specimens and should not be used as a sole basis for treatment. Nasal washings and aspirates are unacceptable for Xpert Xpress SARS-CoV-2/FLU/RSV testing.  Fact Sheet for Patients: EntrepreneurPulse.com.au  Fact Sheet for Healthcare Providers: IncredibleEmployment.be  This test is not yet approved or cleared by the [place] FDA and has been authorized for detection and/or diagnosis of SARS-CoV-2 by FDA under an Emergency Use Authorization (EUA). This EUA will remain in effect (meaning this test can be used) for the duration of the COVID-19 declaration under Section 564(b)(1) of the Act, 21 U.S.C. section 360bbb-3(b)(1), unless the authorization is terminated or revoked.  Performed at [facility], [place] [address], [place], [place] [address]   MRSA PCR Screening     Status: None   Collection Time: 02/19/21  7:18 PM   Specimen: Nasal Mucosa; Nasopharyngeal  Result Value Ref Range Status   MRSA by PCR NEGATIVE NEGATIVE Final    Comment:        The GeneXpert MRSA Assay (FDA approved for NASAL specimens only), is one component of a comprehensive MRSA colonization surveillance program. It is not intended to diagnose MRSA infection nor  to guide or monitor treatment for MRSA infections. Performed at [facility], [place] [address], [place], [place] [address]      Radiology Studies: DG Chest Port 1 View  Result Date: 02/19/2021 CLINICAL DATA:  Shortness of breath. EXAM: PORTABLE CHEST 1 VIEW COMPARISON:  02/01/2021 FINDINGS: Heart size and mediastinal contours are normal. Aortic atherosclerosis. Small bilateral pleural effusions, unchanged. Mild diffuse increase interstitial markings identified compatible with interstitial edema. More focal airspace  disease within the right midlung is identified which may represent an area of pneumonia or asymmetric edema. IMPRESSION: 1. CHF pattern. 2. More focal airspace disease within the right midlung may represent pneumonia or asymmetric edema. Electronically Signed   By: Kerby Moors M.D.   On: 02/19/2021 09:06   Marzetta Board, MD, PhD [facility]  Between 7 am - 7 pm I am available, please contact me via Amion or Securechat  Between 7 pm - 7 am I am not available, please contact night coverage MD/APP via Amion

## 2021-02-20 NOTE — Evaluation (Signed)
Occupational Therapy Evaluation Patient Details Name: Brandon [name] MRN: [id number] DOB: January 29, 1946 Today's Date: 02/20/2021    History of Present Illness 75 year old male with hypertension, CAD, recent MI with stent placement January 2022, COPD on 4.5 L home oxygen, peripheral vascular disease, diabetes who comes in with shortness of breath. Hospitalized 2/2-2/7 for COPD and diastolic CHF. Returns to hospital 02/19/21 needing BiPAP, and then 10L O2. Admitted for treatment of Acute on chronic hypoxic respiratory failure due to acute pulmonary edema in the setting of acute on chronic diastolic CHF and COPD exacerbation, lobar pneumonia.   Clinical Impression   Pt was living alone with intermittent assist of his ex wife and daughter for showering and IADL. Pt reports he eats "TV dinners." He is aware that frozen meals frequently contain excessive sodium. Pt presents with decreased activity tolerance and requires up to min assist for ADL, specifically LB bathing and dressing. Will follow to educate pt in energy conservation strategies and use of AE for LB ADL.     Follow Up Recommendations  Home health OT    Equipment Recommendations  None recommended by OT    Recommendations for Other Services       Precautions / Restrictions Precautions Precautions: Fall;Other (comment) Precaution Comments: watch O2 Restrictions Weight Bearing Restrictions: No      Mobility Bed Mobility               General bed mobility comments: OOB in chair    Transfers Overall transfer level: Needs assistance Equipment used: Rolling walker (2 wheeled) Transfers: Sit to/from Stand Sit to Stand: Supervision              Balance Overall balance assessment: Needs assistance Sitting-balance support: Feet supported Sitting balance-Leahy Scale: Good     Standing balance support: Bilateral upper extremity supported;Single extremity supported Standing balance-Leahy Scale: Fair                              ADL either performed or assessed with clinical judgement   ADL Overall ADL's : Needs assistance/impaired Eating/Feeding: Independent   Grooming: Min guard;Wash/dry hands;Standing   Upper Body Bathing: Sitting;Set up   Lower Body Bathing: Minimal assistance;Sit to/from stand   Upper Body Dressing : Set up;Sitting   Lower Body Dressing: Minimal assistance;Sit to/from stand Lower Body Dressing Details (indicate cue type and reason): pt avoid socks and wears slippers primarily Toilet Transfer: Min guard;Ambulation;RW   Toileting- Water quality scientist and Hygiene: Min guard;Sit to/from stand       Functional mobility during ADLs: Min guard;Rolling walker       Vision Patient Visual Report: No change from baseline       Perception     Praxis      Pertinent Vitals/Pain Pain Assessment: No/denies pain Faces Pain Scale: No hurt     Hand Dominance Right   Extremity/Trunk Assessment Upper Extremity Assessment Upper Extremity Assessment: Overall WFL for tasks assessed   Lower Extremity Assessment Lower Extremity Assessment: Defer to PT evaluation   Cervical / Trunk Assessment Cervical / Trunk Assessment: Kyphotic   Communication Communication Communication: HOH   Cognition Arousal/Alertness: Awake/alert Behavior During Therapy: WFL for tasks assessed/performed Overall Cognitive Status: Within Functional Limits for tasks assessed                                 General Comments: poor compliance with low sodium  diet   General Comments  Pt on 10 L O2 via [place] with SaO2 94%O2 at rest, dropped to 78%O2 with short distance ambulation. requires 2 min for recover back to 94%O2    Exercises     Shoulder Instructions      Home Living Family/patient expects to be discharged to:: Private residence Living Arrangements: Alone Available Help at Discharge: Family;Available PRN/intermittently Type of Home: House Home Access: Stairs to  enter [employer] of Steps: 3 Entrance Stairs-Rails: Right;Left;Can reach both Home Layout: One level     Bathroom Shower/Tub: Teacher, early years/pre: Standard Bathroom Accessibility: Yes   Home Equipment: Grab bars - tub/shower;Shower seat;Cane - single point;Walker - 2 wheels;Walker - 4 wheels   Additional Comments: daughter is working with [employer] on renovating bathroom and getting him a w/c      Prior Functioning/Environment Level of Independence: Needs assistance  Gait / Transfers Assistance Needed: using walker in past week PTA, denies falls ADL's / Homemaking Assistance Needed: requires assist for shower transfers. daughter visits every week and assists with IADL's            OT Problem List: Decreased activity tolerance;Impaired balance (sitting and/or standing);Decreased knowledge of use of DME or AE      OT Treatment/Interventions: Self-care/ADL training;Energy conservation;DME and/or AE instruction;Patient/family education;Balance training;Therapeutic activities    OT Goals(Current goals can be found in the care plan section) Acute Rehab OT Goals Patient Stated Goal: return home OT Goal Formulation: With patient Time For Goal Achievement: 03/06/21 Potential to Achieve Goals: Fair ADL Goals Pt Will Perform Grooming: with modified independence;standing Pt Will Perform Lower Body Bathing: with modified independence;with adaptive equipment;sit to/from stand Pt Will Perform Lower Body Dressing: with modified independence;sit to/from stand;with adaptive equipment Pt Will Transfer to Toilet: with modified independence;ambulating;bedside commode (over toilet) Pt Will Perform Toileting - Clothing Manipulation and hygiene: with modified independence;sit to/from stand Additional ADL Goal #1: Pt will gather items necessary for ADL around his room with RW. Additional ADL Goal #2: Pt will implement energy conservation and breathing strategies during ADL and  mobility.  OT Frequency: Min 2X/week   Barriers to D/C:            Co-evaluation              AM-PAC OT "6 Clicks" Daily Activity     Outcome Measure Help from another person eating meals?: None Help from another person taking care of personal grooming?: A Little Help from another person toileting, which includes using toliet, bedpan, or urinal?: A Little Help from another person bathing (including washing, rinsing, drying)?: A Little Help from another person to put on and taking off regular upper body clothing?: None Help from another person to put on and taking off regular lower body clothing?: A Little 6 Click Score: 20   End of Session Equipment Utilized During Treatment: Rolling walker;Gait belt;Oxygen (11L)  Activity Tolerance: Patient limited by fatigue Patient left: in chair;with call bell/phone within reach  OT Visit Diagnosis: Unsteadiness on feet (R26.81);Other abnormalities of gait and mobility (R26.89)                Time: 1829-9371 OT Time Calculation (min): 16 min Charges:  OT General Charges $OT Visit: 1 Visit OT Evaluation $OT Eval Moderate Complexity: 1 Mod  Nestor Lewandowsky, OTR/L Acute Rehabilitation Services Pager: [phone] Office: [phone]  Malka So 02/20/2021, 1:24 PM

## 2021-02-20 NOTE — Evaluation (Signed)
Physical Therapy Evaluation Patient Details Name: Brandon [name] MRN: [id number] DOB: Nov 29, 1946 Today's Date: 02/20/2021   History of Present Illness  75 year old male with hypertension, CAD, recent MI with stent placement January 2022, COPD on 4.5 L home oxygen, peripheral vascular disease, diabetes who comes in with shortness of breath. Hospitalized 2/2-2/7 for COPD and diastolic CHF. Returns to hospital 02/19/21 needing BiPAP, and then 10L O2. Admitted for treatment of Acute on chronic hypoxic respiratory failure due to acute pulmonary edema in the setting of acute on chronic diastolic CHF and COPD exacerbation, lobar pneumonia.  Clinical Impression  PTA pt living alone in single story home with 4 steps to enter. Pt is independent with ambulation, ADLs, daughter assists with groceries and bills. Pt states that he could go stay with his daughter at discharge if needed. Pt is limited in safe mobility by increased O2 demand in presence of generalized weakness. Pt is supervision for transfers and min guard for ambulation of 20 feet with RW. Ambulation limited by oxygen desaturation to 70s%O2 with ambulation. PT recommending HHPT to progress ambulation and facilitate energy conservation in his home environment. PT will continue to follow acutely.     Follow Up Recommendations Home health PT;Supervision for mobility/OOB    Equipment Recommendations  None recommended by PT       Precautions / Restrictions Precautions Precautions: Fall;Other (comment) Precaution Comments: watch O2 Restrictions Weight Bearing Restrictions: No      Mobility  Bed Mobility               General bed mobility comments: OOB in chair    Transfers Overall transfer level: Needs assistance Equipment used: Rolling walker (2 wheeled) Transfers: Sit to/from Stand Sit to Stand: Supervision            Ambulation/Gait Ambulation/Gait assistance: Min guard Gait Distance (Feet): 20 Feet Assistive device:  Rolling walker (2 wheeled) Gait Pattern/deviations: Step-through pattern;Decreased stride length;Trunk flexed Gait velocity: decreased Gait velocity interpretation: <1.31 ft/sec, indicative of household ambulator General Gait Details: Min guard for safety, vc for proximity to RW      Balance Overall balance assessment: Needs assistance Sitting-balance support: Feet supported Sitting balance-Leahy Scale: Good     Standing balance support: Bilateral upper extremity supported;Single extremity supported Standing balance-Leahy Scale: Fair                               Pertinent Vitals/Pain Faces Pain Scale: No hurt    Home Living Family/patient expects to be discharged to:: Private residence Living Arrangements: Alone Available Help at Discharge: Family;Available PRN/intermittently Type of Home: House Home Access: Stairs to enter Entrance Stairs-Rails: Right;Left;Can reach both Entrance Stairs-Number of Steps: 3 Home Layout: One level Home Equipment: Grab bars - tub/shower;Shower seat;Cane - single point;Walker - 2 wheels;Walker - 4 wheels      Prior Function Level of Independence: Needs assistance   Gait / Transfers Assistance Needed: using walker in past week PTA, denies falls  ADL's / Homemaking Assistance Needed: requires assist for shower transfers. daughter visits every week and assists with IADL's        Hand Dominance   Dominant Hand: Right    Extremity/Trunk Assessment   Upper Extremity Assessment Upper Extremity Assessment: Overall WFL for tasks assessed    Lower Extremity Assessment Lower Extremity Assessment: Overall WFL for tasks assessed    Cervical / Trunk Assessment Cervical / Trunk Assessment: Kyphotic  Communication   Communication: [facility]  Cognition Arousal/Alertness: Awake/alert Behavior During Therapy: WFL for tasks assessed/performed Overall Cognitive Status: Within Functional Limits for tasks assessed                                         General Comments General comments (skin integrity, edema, etc.): Pt on 10 L O2 via [place] with SaO2 94%O2 at rest, dropped to 78%O2 with short distance ambulation. requires 2 min for recover back to 94%O2        Assessment/Plan    PT Assessment Patient needs continued PT services  PT Problem List Decreased balance;Decreased coordination;Decreased safety awareness       PT Treatment Interventions DME instruction;Gait training;Stair training;Functional mobility training;Therapeutic activities;Therapeutic exercise;Balance training;Neuromuscular re-education;Patient/family education    PT Goals (Current goals can be found in the Care Plan section)  Acute Rehab PT Goals Patient Stated Goal: return home PT Goal Formulation: With patient Time For Goal Achievement: 02/20/21 Potential to Achieve Goals: Good    Frequency Min 3X/week    AM-PAC PT "6 Clicks" Mobility  Outcome Measure Help needed turning from your back to your side while in a flat bed without using bedrails?: None Help needed moving from lying on your back to sitting on the side of a flat bed without using bedrails?: None Help needed moving to and from a bed to a chair (including a wheelchair)?: None Help needed standing up from a chair using your arms (e.g., wheelchair or bedside chair)?: None Help needed to walk in hospital room?: A Little Help needed climbing 3-5 steps with a railing? : A Little 6 Click Score: 22    End of Session Equipment Utilized During Treatment: Gait belt;Oxygen Activity Tolerance: Patient tolerated treatment well Patient left: in chair;with call bell/phone within reach Nurse Communication: Mobility status PT Visit Diagnosis: Unsteadiness on feet (R26.81);Difficulty in walking, not elsewhere classified (R26.2)    Time: 3220-2542 PT Time Calculation (min) (ACUTE ONLY): 34 min   Charges:   PT Evaluation $PT Eval Moderate Complexity: 1 Mod PT Treatments $Gait  Training: 8-22 mins        Jayse Hodkinson B. Migdalia Dk PT, DPT Acute Rehabilitation Services Pager [phone] Office [phone]   [place] 02/20/2021, 11:02 AM

## 2021-02-21 ENCOUNTER — Inpatient Hospital Stay (HOSPITAL_COMMUNITY): Payer: No Typology Code available for payment source

## 2021-02-21 DIAGNOSIS — E43 Unspecified severe protein-calorie malnutrition: Secondary | ICD-10-CM | POA: Insufficient documentation

## 2021-02-21 DIAGNOSIS — I5031 Acute diastolic (congestive) heart failure: Secondary | ICD-10-CM

## 2021-02-21 LAB — GLUCOSE, CAPILLARY
Glucose-Capillary: 176 mg/dL — ABNORMAL HIGH (ref 70–99)
Glucose-Capillary: 224 mg/dL — ABNORMAL HIGH (ref 70–99)
Glucose-Capillary: 171 mg/dL — ABNORMAL HIGH (ref 70–99)
Glucose-Capillary: 224 mg/dL — ABNORMAL HIGH (ref 70–99)

## 2021-02-21 LAB — BASIC METABOLIC PANEL
Anion gap: 10 (ref 5–15)
BUN: 23 mg/dL (ref 8–23)
CO2: 26 mmol/L (ref 22–32)
Calcium: 8.7 mg/dL — ABNORMAL LOW (ref 8.9–10.3)
Chloride: 102 mmol/L (ref 98–111)
Creatinine, Ser: 1.1 mg/dL (ref 0.61–1.24)
GFR, Estimated: >60 mL/min (ref >60)
Glucose, Bld: 170 mg/dL — ABNORMAL HIGH (ref 70–99)
Potassium: 3.4 mmol/L — ABNORMAL LOW (ref 3.5–5.1)
Sodium: 138 mmol/L (ref 135–145)

## 2021-02-21 LAB — ECHOCARDIOGRAM COMPLETE
Area-P 1/2: 2.95 cm2
S' Lateral: 3.3 cm
Weight: 2698.43 oz

## 2021-02-21 LAB — MAGNESIUM: Magnesium: 1.8 mg/dL (ref 1.7–2.4)

## 2021-02-21 LAB — D-DIMER, QUANTITATIVE: D-Dimer, Quant: 0.74 ug/mL-FEU — ABNORMAL HIGH (ref 0.00–0.50)

## 2021-02-21 MED ORDER — POTASSIUM CHLORIDE CRYS ER 20 MEQ PO TBCR
40.0000 meq | EXTENDED_RELEASE_TABLET | Freq: Once | ORAL | Status: AC
  Administered 2021-02-21: 40 meq via ORAL
  Filled 2021-02-21: qty 2

## 2021-02-21 NOTE — Progress Notes (Signed)
PROGRESS NOTE  Brandon [name] YHC:[id number] DOB: Aug 30, 1946 DOA: 02/19/2021 PCP: Pcp, No   LOS: 2 days   Brief Narrative / Interim history: 75 year old male with hypertension, CAD, recent MI with stent placement January 2022, COPD on 4.5 L home oxygen, peripheral vascular disease, diabetes who comes in with shortness of breath.  Patient was hospitalized 2/2-2/7 for COPD and diastolic CHF, was discharged and did very well for the first few days however started to be more more short of breath had to come back to the hospital.  He denies any weight gain/fluid overload, weight himself daily.  He reports compliance with medication.  States that he is not really watching his sodium intake.  He used to smoke quit last year in August.  He needed to be on BiPAP in the ED.  He was given Lasix along with antibiotics  Subjective / 24h Interval events: Still feeling short of breath this morning, frustrated by the fact that to come back to the hospital so quickly.  No chest pain, no abdominal pain, no nausea or vomiting.  Assessment & Plan: Principal Problem Acute on chronic hypoxic respiratory failure due to acute pulmonary edema in the setting of acute on chronic diastolic CHF and COPD exacerbation, lobar pneumonia -Patient remains hypoxic but improving, on 7 L high flow this morning -Renal function is stable, continue Lasix -he was placed on antibiotics, steroids, nebulizers continue.  Repeat chest x-ray again concerning for multifocal pneumonia or possibly edema.  Small effusions.  SARS-CoV-2, flu A & B were negative -Wean off oxygen as tolerated, baseline is about 4.5 L -Last time he was admitted a D-dimer was high and his recent CT angiogram last month was negative for PE.  Repeat D-dimer but would not scan again unless significantly elevated  Active Problems Coronary artery disease -Recent non-STEMI in January with PCI to the proximal to mid LAD, orbital atherectomy and placement of drug-eluting  stent stop troponin negative -Continue Plavix, aspirin, statin, beta-blocker, diltiazem -Repeat 2D echo pending this morning  Essential hypertension -Blood pressure stable on current regimen  Hyperlipidemia -continue statin  Paroxysmal atrial tachycardia -No reports of A. fib, anticoagulation was not advised by cardiology -Continue beta-blockers, calcium channel blockers  History of CVA -Monitor platelets  Tobacco abuse, in remission -He quit last year  Hypokalemia -Due to diuresis, replete  Type 2 diabetes mellitus, controlled, A1c 6.4 -Continue home insulin  CBG (last 3)  Recent Labs    02/20/21 1625 02/20/21 2125 02/21/21 0523  GLUCAP 199* 215* 176*      Scheduled Meds: . (feeding supplement) PROSource Plus  30 mL Oral BID BM  . arformoterol  15 mcg Nebulization BID  . aspirin EC  81 mg Oral Daily  . atorvastatin  40 mg Oral QHS  . bisoprolol  5 mg Oral Daily  . budesonide (PULMICORT) nebulizer solution  0.5 mg Nebulization BID  . clopidogrel  75 mg Oral Daily  . diltiazem  240 mg Oral Daily  . enoxaparin (LOVENOX) injection  40 mg Subcutaneous Daily  . feeding supplement (GLUCERNA SHAKE)  237 mL Oral BID BM  . furosemide  40 mg Intravenous BID  . insulin aspart  0-15 Units Subcutaneous TID WC  . insulin aspart  0-5 Units Subcutaneous QHS  . insulin aspart protamine- aspart  20 Units Subcutaneous BID WC  . ipratropium-albuterol  3 mL Nebulization BID  . magnesium oxide  400 mg Oral Daily  . multivitamin with minerals  1 tablet Oral Daily  . nortriptyline  50 mg Oral QHS  . oxybutynin  10 mg Oral Daily  . pantoprazole  40 mg Oral QAC breakfast  . predniSONE  40 mg Oral Q breakfast  . sodium chloride flush  3 mL Intravenous Q12H   Continuous Infusions: . sodium chloride    . azithromycin 500 mg (02/21/21 1019)  . cefTRIAXone (ROCEPHIN)  IV 1 g (02/21/21 0914)   PRN Meds:.sodium chloride, acetaminophen, ondansetron (ZOFRAN) IV, sodium chloride  flush  Diet Orders (From admission, onward)    Start     Ordered   02/19/21 1241  Diet heart healthy/carb modified Room service appropriate? Yes; Fluid consistency: Thin  Diet effective now       Question Answer Comment  Diet-HS Snack? Nothing   Room service appropriate? Yes   Fluid consistency: Thin      02/19/21 1240          DVT prophylaxis: enoxaparin (LOVENOX) injection 40 mg Start: 02/19/21 1200     Code Status: Full Code  Family Communication: no family at bedside   Status is: Inpatient  Remains inpatient appropriate because:Inpatient level of care appropriate due to severity of illness   Dispo: The patient is from: Home              Anticipated d/c is to: Home              Anticipated d/c date is: 3 days              Patient currently is not medically stable to d/c.   Difficult to place patient No  Level of care: Progressive  Consultants:  None   Procedures:  2D echo: pending  Microbiology  None   Antimicrobials: Ceftriaxone / Azithromycin 2/20 >>    Objective: Vitals:   02/21/21 0748 02/21/21 0805 02/21/21 0808 02/21/21 0900  BP: 113/74   111/67  Pulse: 93   94  Resp: 19   19  Temp: 98.1 F (36.7 C)     TempSrc: Oral     SpO2: 96% 96% 98% 96%  Weight:        Intake/Output Summary (Last 24 hours) at 02/21/2021 1023 Last data filed at 02/21/2021 4034 Gross per 24 hour  Intake 950 ml  Output 1650 ml  Net -700 ml   Filed Weights   02/20/21 0500 02/21/21 0500  Weight: 78.6 kg 76.5 kg    Examination:  Constitutional: No distress, more comfortable Eyes: No scleral icterus ENMT: mmm Neck: normal, supple Respiratory: Bibasilar crackles, end expiratory wheezing almost resolved today, moves air well Cardiovascular: Regular rate and rhythm, tachycardic, no murmurs, trace edema Abdomen: Soft, NT, ND, bowel sounds positive Musculoskeletal: no clubbing / cyanosis.  Skin: No rashes seen Neurologic: Nonfocal, equal strength  Data Reviewed:  I have independently reviewed following labs and imaging studies   CBC: Recent Labs  Lab 02/19/21 0833 02/19/21 0842  WBC 10.0  --   NEUTROABS 7.4  --   HGB 11.9* 13.3  HCT 38.5* 39.0  MCV 85.2  --   PLT 351  --    Basic Metabolic Panel: Recent Labs  Lab 02/19/21 0833 02/19/21 0842 02/20/21 0048 02/21/21 0058  NA 138 140 137 138  K 3.7 3.8 3.4* 3.4*  CL 101  --  100 102  CO2 24  --  26 26  GLUCOSE 173*  --  308* 170*  BUN 16  --  20 23  CREATININE 1.14  --  1.13 1.10  CALCIUM 8.8*  --  8.7* 8.7*  MG  --   --  1.7 1.8   Liver Function Tests: No results for input(s): AST, ALT, ALKPHOS, BILITOT, PROT, ALBUMIN in the last 168 hours. Coagulation Profile: No results for input(s): INR, PROTIME in the last 168 hours. HbA1C: No results for input(s): HGBA1C in the last 72 hours. CBG: Recent Labs  Lab 02/20/21 0640 02/20/21 1134 02/20/21 1625 02/20/21 2125 02/21/21 0523  GLUCAP 257* 245* 199* 215* 176*    Recent Results (from the past 240 hour(s))  Resp Panel by RT-PCR (Flu A&B, Covid) Nasopharyngeal Swab     Status: None   Collection Time: 02/19/21  8:18 AM   Specimen: Nasopharyngeal Swab; Nasopharyngeal(NP) swabs in vial transport medium  Result Value Ref Range Status   SARS Coronavirus 2 by RT PCR NEGATIVE NEGATIVE Final    Comment: (NOTE) SARS-CoV-2 target nucleic acids are NOT DETECTED.  The SARS-CoV-2 RNA is generally detectable in upper respiratory specimens during the acute phase of infection. The lowest concentration of SARS-CoV-2 viral copies this assay can detect is 138 copies/mL. A negative result does not preclude SARS-Cov-2 infection and should not be used as the sole basis for treatment or other patient management decisions. A negative result may occur with  improper specimen collection/handling, submission of specimen other than nasopharyngeal swab, presence of viral mutation(s) within the areas targeted by this assay, and inadequate number of  viral copies(<138 copies/mL). A negative result must be combined with clinical observations, patient history, and epidemiological information. The expected result is Negative.  Fact Sheet for Patients:  EntrepreneurPulse.com.au  Fact Sheet for Healthcare Providers:  IncredibleEmployment.be  This test is no t yet approved or cleared by the [place] FDA and  has been authorized for detection and/or diagnosis of SARS-CoV-2 by FDA under an Emergency Use Authorization (EUA). This EUA will remain  in effect (meaning this test can be used) for the duration of the COVID-19 declaration under Section 564(b)(1) of the Act, 21 U.S.C.section 360bbb-3(b)(1), unless the authorization is terminated  or revoked sooner.       Influenza A by PCR NEGATIVE NEGATIVE Final   Influenza B by PCR NEGATIVE NEGATIVE Final    Comment: (NOTE) The Xpert Xpress SARS-CoV-2/FLU/RSV plus assay is intended as an aid in the diagnosis of influenza from Nasopharyngeal swab specimens and should not be used as a sole basis for treatment. Nasal washings and aspirates are unacceptable for Xpert Xpress SARS-CoV-2/FLU/RSV testing.  Fact Sheet for Patients: EntrepreneurPulse.com.au  Fact Sheet for Healthcare Providers: IncredibleEmployment.be  This test is not yet approved or cleared by the [place] FDA and has been authorized for detection and/or diagnosis of SARS-CoV-2 by FDA under an Emergency Use Authorization (EUA). This EUA will remain in effect (meaning this test can be used) for the duration of the COVID-19 declaration under Section 564(b)(1) of the Act, 21 U.S.C. section 360bbb-3(b)(1), unless the authorization is terminated or revoked.  Performed at [facility], [place] [address], [place], [place] [address]   MRSA PCR Screening     Status: None   Collection Time: 02/19/21  7:18 PM   Specimen: Nasal Mucosa; Nasopharyngeal   Result Value Ref Range Status   MRSA by PCR NEGATIVE NEGATIVE Final    Comment:        The GeneXpert MRSA Assay (FDA approved for NASAL specimens only), is one component of a comprehensive MRSA colonization surveillance program. It is not intended to diagnose MRSA infection nor to guide or monitor treatment for MRSA  infections. Performed at [facility], [place] [address] [place], [place] [address]      Radiology Studies: DG CHEST PORT 1 VIEW  Result Date: 02/21/2021 CLINICAL DATA:  Dyspnea. EXAM: PORTABLE CHEST 1 VIEW COMPARISON:  February 19, 2021. FINDINGS: Stable cardiomediastinal silhouette. No pneumothorax is noted. Bilateral coarse lung opacities are noted concerning for multifocal pneumonia or possibly edema. Small pleural effusions may be present. Bony thorax is unremarkable. IMPRESSION: Bilateral coarse lung opacities are noted concerning for multifocal pneumonia or possibly edema. Small pleural effusions may be present. Electronically Signed   By: Marijo Conception M.D.   On: 02/21/2021 08:16   Marzetta Board, MD, PhD [facility]  Between 7 am - 7 pm I am available, please contact me via Amion or Securechat  Between 7 pm - 7 am I am not available, please contact night coverage MD/APP via Amion

## 2021-02-21 NOTE — TOC Progression Note (Signed)
Transition of Care [facility]) - Progression Note    Patient Details  Name: Brandon [name] MRN: [id number] Date of Birth: 1946-06-20  Transition of Care [facility]) CM/SW Contact  Zenon Mayo, RN Phone Number: 02/21/2021, 12:17 PM  Clinical Narrative:    Patient is active with [facility], [place], [place]. Will need resumption orders.        Expected Discharge Plan and Services                                                 Social Determinants of Health (SDOH) Interventions    Readmission Risk Interventions Readmission Risk Prevention Plan 02/06/2021  Transportation Screening Complete  PCP or Specialist Appt within 3-5 Days Complete  HRI or [place] Complete  Social Work Consult for [place] Planning/Counseling Complete  Palliative Care Screening Not Applicable  Medication Review [employer]) Complete  Some recent data might be hidden

## 2021-02-22 DIAGNOSIS — J9621 Acute and chronic respiratory failure with hypoxia: Principal | ICD-10-CM

## 2021-02-22 LAB — CBC
HCT: 33.4 % — ABNORMAL LOW (ref 39.0–52.0)
Hemoglobin: 10.3 g/dL — ABNORMAL LOW (ref 13.0–17.0)
MCH: 25.9 pg — ABNORMAL LOW (ref 26.0–34.0)
MCHC: 30.8 g/dL (ref 30.0–36.0)
MCV: 83.9 fL (ref 80.0–100.0)
Platelets: 337 10*3/uL (ref 150–400)
RBC: 3.98 MIL/uL — ABNORMAL LOW (ref 4.22–5.81)
RDW: 28 % — ABNORMAL HIGH (ref 11.5–15.5)
WBC: 10.6 10*3/uL — ABNORMAL HIGH (ref 4.0–10.5)
nRBC: 0 % (ref 0.0–0.2)

## 2021-02-22 LAB — GLUCOSE, CAPILLARY
Glucose-Capillary: 169 mg/dL — ABNORMAL HIGH (ref 70–99)
Glucose-Capillary: 201 mg/dL — ABNORMAL HIGH (ref 70–99)
Glucose-Capillary: 140 mg/dL — ABNORMAL HIGH (ref 70–99)
Glucose-Capillary: 303 mg/dL — ABNORMAL HIGH (ref 70–99)

## 2021-02-22 LAB — BASIC METABOLIC PANEL
Anion gap: 11 (ref 5–15)
BUN: 28 mg/dL — ABNORMAL HIGH (ref 8–23)
CO2: 26 mmol/L (ref 22–32)
Calcium: 8.6 mg/dL — ABNORMAL LOW (ref 8.9–10.3)
Chloride: 100 mmol/L (ref 98–111)
Creatinine, Ser: 1.02 mg/dL (ref 0.61–1.24)
GFR, Estimated: >60 mL/min (ref >60)
Glucose, Bld: 206 mg/dL — ABNORMAL HIGH (ref 70–99)
Potassium: 3.6 mmol/L (ref 3.5–5.1)
Sodium: 137 mmol/L (ref 135–145)

## 2021-02-22 MED ORDER — MAGNESIUM SULFATE 2 GM/50ML IV SOLN
2.0000 g | Freq: Once | INTRAVENOUS | Status: AC
  Administered 2021-02-22: 2 g via INTRAVENOUS
  Filled 2021-02-22: qty 50

## 2021-02-22 NOTE — Progress Notes (Signed)
Physical Therapy Treatment Patient Details Name: Brandon [name] MRN: [id number] DOB: Sep 03, 1946 Today's Date: 02/22/2021    History of Present Illness 75 year old male with hypertension, CAD, recent MI with stent placement January 2022, COPD on 4.5 L home oxygen, peripheral vascular disease, diabetes who comes in with shortness of breath. Hospitalized 2/2-2/7 for COPD and diastolic CHF. Returns to hospital 02/19/21 needing BiPAP, and then 10L O2. Admitted for treatment of Acute on chronic hypoxic respiratory failure due to acute pulmonary edema in the setting of acute on chronic diastolic CHF and COPD exacerbation, lobar pneumonia.    PT Comments    Pt supine in bed agreeable to walking with therapy.  Pt making progress towards his goals however continues to be limited in safe mobility by increased oxygen demand and DoE. Pt is currently supervision for bed mobility, and transfers and Min guard for ambulation with RW. Pt able to walk 60 feet before requiring seated rest break to recover breath. Unable to get good pleth wave form until returned to room when SaO2 in high 80s. Given pt frequent shortness of breath recommend pt d/c with Rollator to be able to take seated rest breaks with ambulation. D/c plans remain appropriate. PT will continue to follow acutely.     Follow Up Recommendations  Home health PT;Supervision for mobility/OOB     Equipment Recommendations  None recommended by PT    Recommendations for Other Services       Precautions / Restrictions Precautions Precautions: Fall;Other (comment) Precaution Comments: watch O2 Restrictions Weight Bearing Restrictions: No    Mobility  Bed Mobility Overal bed mobility: Needs Assistance Bed Mobility: Supine to Sit     Supine to sit: HOB elevated;Supervision     General bed mobility comments: increased effort and HoB elevated to come to EoB    Transfers Overall transfer level: Needs assistance Equipment used: Rolling walker  (2 wheeled) Transfers: Sit to/from Stand Sit to Stand: Supervision         General transfer comment: good power up and steadying in RW  Ambulation/Gait Ambulation/Gait assistance: Min guard Gait Distance (Feet): 60 Feet (1x 60' and 1x30') Assistive device: Rolling walker (2 wheeled) Gait Pattern/deviations: Step-through pattern;Decreased stride length;Trunk flexed Gait velocity: decreased Gait velocity interpretation: <1.31 ft/sec, indicative of household ambulator General Gait Details: Min guard for safety, pt with increased WoB requiring seated restbreak, after recovered able to return to room with min guard         Balance Overall balance assessment: Needs assistance Sitting-balance support: Feet supported Sitting balance-Leahy Scale: Good     Standing balance support: Bilateral upper extremity supported;Single extremity supported Standing balance-Leahy Scale: Fair                              Cognition Arousal/Alertness: Awake/alert Behavior During Therapy: WFL for tasks assessed/performed Overall Cognitive Status: Within Functional Limits for tasks assessed                                           General Comments General comments (skin integrity, edema, etc.): Pt on 5L O2 via HFNC, SaO2 prior to ambulation 96%O2 with ambulation and shortness of breath, poor pleth wave form and unable to tell SaO2 using various probes. Pt recovers within 2 min and able to continue walk back to room.      Pertinent Vitals/Pain  Pain Assessment: No/denies pain Faces Pain Scale: No hurt           PT Goals (current goals can now be found in the care plan section) Acute Rehab PT Goals Patient Stated Goal: return home PT Goal Formulation: With patient Time For Goal Achievement: 02/20/21 Potential to Achieve Goals: Good Progress towards PT goals: Progressing toward goals    Frequency    Min 3X/week      PT Plan Current plan remains  appropriate       AM-PAC PT "6 Clicks" Mobility   Outcome Measure  Help needed turning from your back to your side while in a flat bed without using bedrails?: None Help needed moving from lying on your back to sitting on the side of a flat bed without using bedrails?: None Help needed moving to and from a bed to a chair (including a wheelchair)?: None Help needed standing up from a chair using your arms (e.g., wheelchair or bedside chair)?: None Help needed to walk in hospital room?: A Little Help needed climbing 3-5 steps with a railing? : A Little 6 Click Score: 22    End of Session Equipment Utilized During Treatment: Gait belt;Oxygen Activity Tolerance: Patient tolerated treatment well Patient left: in chair;with call bell/phone within reach Nurse Communication: Mobility status PT Visit Diagnosis: Unsteadiness on feet (R26.81);Difficulty in walking, not elsewhere classified (R26.2)     Time: 0932-6712 PT Time Calculation (min) (ACUTE ONLY): 24 min  Charges:  $Gait Training: 23-37 mins                     Brandon [name] B. Migdalia Dk PT, DPT Acute Rehabilitation Services Pager [phone] Office [phone]    [place] 02/22/2021, 3:59 PM

## 2021-02-22 NOTE — Progress Notes (Signed)
Progress Note    Brandon [name]  WNU:[id number] DOB: 1946/05/02  DOA: 02/19/2021 PCP: Pcp, No    Brief Narrative:     Medical records reviewed and are as summarized below:  Brandon [name] is an 75 y.o. male with medical history significant of HTN, CAD, recent MI with stent placement in 12/2020, COPD on  4.5 L of home O2, PVD, and DM presents with worsening shortness of breath.  History is somewhat limited as patient is currently on BiPAP.  Patient had just recently been hospitalized from 2/2-2/7 for COPD and diastolic congestive heart failure exacerbation.  Patient reported that he initially was doing well the first 3 to 4 days after getting discharged from the hospital.  However, since that time he had progressively worsening shortness of breath.   Assessment/Plan:   Principal Problem:   Acute on chronic respiratory failure with hypoxia (HCC) Active Problems:   Essential hypertension   Type 2 diabetes mellitus (HCC)   COPD with acute exacerbation (HCC)   History of CVA (cerebrovascular accident)   (HFpEF) heart failure with preserved ejection fraction (HCC)   Normocytic anemia   Protein-calorie malnutrition, severe    Acute on chronic hypoxic respiratory failure due to acute pulmonary edema in the setting of acute on chronic diastolic CHF and COPD exacerbation, lobar pneumonia -weaned to home O2 of 4.5L -Renal function is stable, continue diuresis with IV Lasix -prior weight at d/c was 75.1 -he was placed on antibiotics, steroids, nebulizers continue.  Repeat chest x-ray again concerning for multifocal pneumonia or possibly edema.  Small effusions.  SARS-CoV-2, flu A & B were negative -upon discussion with patient he does not follow a  Low salt diet at home  Coronary artery disease -Recent non-STEMI in January with PCI to the proximal to mid LAD, orbital atherectomy and placement of drug-eluting stent stop troponin negative -Continue Plavix, aspirin, statin, beta-blocker,  diltiazem -echo unchanged from prior  Essential hypertension -Blood pressure stable on current regimen  Hyperlipidemia -continue statin  Paroxysmal atrial tachycardia -No reports of A. fib, anticoagulation was not advised by cardiology -Continue beta-blockers, calcium channel blockers  Tobacco abuse, in remission -He quit last year  Hypokalemia -Due to diuresis, replete  Type 2 diabetes mellitus, controlled, A1c 6.4 -Continue home insulin   Family Communication/Anticipated D/C date and plan/Code Status   DVT prophylaxis: Lovenox ordered. Code Status: Full Code.  Disposition Plan: Status is: Inpatient  Remains inpatient appropriate because:Inpatient level of care appropriate due to severity of illness   Dispo: The patient is from: Home              Anticipated d/c is to: Home              Anticipated d/c date is: 1 day              Patient currently is not medically stable to d/c.   Difficult to place patient No         Medical Consultants:    None.   Subjective:   Does not watch salt intake at home  Objective:    Vitals:   02/22/21 0821 02/22/21 0823 02/22/21 0824 02/22/21 0924  BP:    113/71  Pulse: 88   88  Resp: 20   18  Temp:    98 F (36.7 C)  TempSrc:    Oral  SpO2: 92% 93% 93% 94%  Weight:        Intake/Output Summary (Last 24 hours) at 02/22/2021 0957  Last data filed at 02/22/2021 0645 Gross per 24 hour  Intake 240 ml  Output 2050 ml  Net -1810 ml   Filed Weights   02/20/21 0500 02/21/21 0500  Weight: 78.6 kg 76.5 kg    Exam:  General: Appearance:    Thin male in no acute distress     Lungs:     respirations unlabored, diminished at bases  Heart:    Normal heart rate. Normal rhythm. No murmurs, rubs, or gallops.   MS:   All extremities are intact.   Neurologic:   Awake, alert, oriented x 3    Data Reviewed:   I have personally reviewed following labs and imaging studies:  Labs: Labs show the following:    Basic Metabolic Panel: Recent Labs  Lab 02/19/21 0833 02/19/21 0842 02/20/21 0048 02/21/21 0058 02/22/21 0055  NA 138 140 137 138 137  K 3.7 3.8 3.4* 3.4* 3.6  CL 101  --  100 102 100  CO2 24  --  26 26 26   GLUCOSE 173*  --  308* 170* 206*  BUN 16  --  20 23 28*  CREATININE 1.14  --  1.13 1.10 1.02  CALCIUM 8.8*  --  8.7* 8.7* 8.6*  MG  --   --  1.7 1.8  --    GFR Estimated Creatinine Clearance: 67.7 mL/min (by C-G formula based on SCr of 1.02 mg/dL). Liver Function Tests: No results for input(s): AST, ALT, ALKPHOS, BILITOT, PROT, ALBUMIN in the last 168 hours. No results for input(s): LIPASE, AMYLASE in the last 168 hours. No results for input(s): AMMONIA in the last 168 hours. Coagulation profile No results for input(s): INR, PROTIME in the last 168 hours.  CBC: Recent Labs  Lab 02/19/21 0833 02/19/21 0842 02/22/21 0055  WBC 10.0  --  10.6*  NEUTROABS 7.4  --   --   HGB 11.9* 13.3 10.3*  HCT 38.5* 39.0 33.4*  MCV 85.2  --  83.9  PLT 351  --  337   Cardiac Enzymes: No results for input(s): CKTOTAL, CKMB, CKMBINDEX, TROPONINI in the last 168 hours. BNP (last 3 results) No results for input(s): PROBNP in the last 8760 hours. CBG: Recent Labs  Lab 02/21/21 0523 02/21/21 1144 02/21/21 1635 02/21/21 2120 02/22/21 0635  GLUCAP 176* 224* 171* 224* 169*   D-Dimer: Recent Labs    02/21/21 1127  DDIMER 0.74*   Hgb A1c: No results for input(s): HGBA1C in the last 72 hours. Lipid Profile: No results for input(s): CHOL, HDL, LDLCALC, TRIG, CHOLHDL, LDLDIRECT in the last 72 hours. Thyroid function studies: No results for input(s): TSH, T4TOTAL, T3FREE, THYROIDAB in the last 72 hours.  Invalid input(s): FREET3 Anemia work up: No results for input(s): VITAMINB12, FOLATE, FERRITIN, TIBC, IRON, RETICCTPCT in the last 72 hours. Sepsis Labs: Recent Labs  Lab 02/19/21 5465 02/19/21 1926 02/22/21 0055  PROCALCITON  --  <0.10  --   WBC 10.0  --  10.6*     Microbiology Recent Results (from the past 240 hour(s))  Resp Panel by RT-PCR (Flu A&B, Covid) Nasopharyngeal Swab     Status: None   Collection Time: 02/19/21  8:18 AM   Specimen: Nasopharyngeal Swab; Nasopharyngeal(NP) swabs in vial transport medium  Result Value Ref Range Status   SARS Coronavirus 2 by RT PCR NEGATIVE NEGATIVE Final    Comment: (NOTE) SARS-CoV-2 target nucleic acids are NOT DETECTED.  The SARS-CoV-2 RNA is generally detectable in upper respiratory specimens during the acute phase  of infection. The lowest concentration of SARS-CoV-2 viral copies this assay can detect is 138 copies/mL. A negative result does not preclude SARS-Cov-2 infection and should not be used as the sole basis for treatment or other patient management decisions. A negative result may occur with  improper specimen collection/handling, submission of specimen other than nasopharyngeal swab, presence of viral mutation(s) within the areas targeted by this assay, and inadequate number of viral copies(<138 copies/mL). A negative result must be combined with clinical observations, patient history, and epidemiological information. The expected result is Negative.  Fact Sheet for Patients:  EntrepreneurPulse.com.au  Fact Sheet for Healthcare Providers:  IncredibleEmployment.be  This test is no t yet approved or cleared by the [place] FDA and  has been authorized for detection and/or diagnosis of SARS-CoV-2 by FDA under an Emergency Use Authorization (EUA). This EUA will remain  in effect (meaning this test can be used) for the duration of the COVID-19 declaration under Section 564(b)(1) of the Act, 21 U.S.C.section 360bbb-3(b)(1), unless the authorization is terminated  or revoked sooner.       Influenza A by PCR NEGATIVE NEGATIVE Final   Influenza B by PCR NEGATIVE NEGATIVE Final    Comment: (NOTE) The Xpert Xpress SARS-CoV-2/FLU/RSV plus assay is  intended as an aid in the diagnosis of influenza from Nasopharyngeal swab specimens and should not be used as a sole basis for treatment. Nasal washings and aspirates are unacceptable for Xpert Xpress SARS-CoV-2/FLU/RSV testing.  Fact Sheet for Patients: EntrepreneurPulse.com.au  Fact Sheet for Healthcare Providers: IncredibleEmployment.be  This test is not yet approved or cleared by the [place] FDA and has been authorized for detection and/or diagnosis of SARS-CoV-2 by FDA under an Emergency Use Authorization (EUA). This EUA will remain in effect (meaning this test can be used) for the duration of the COVID-19 declaration under Section 564(b)(1) of the Act, 21 U.S.C. section 360bbb-3(b)(1), unless the authorization is terminated or revoked.  Performed at [facility], [place] [address], [place], [place] [address]   MRSA PCR Screening     Status: None   Collection Time: 02/19/21  7:18 PM   Specimen: Nasal Mucosa; Nasopharyngeal  Result Value Ref Range Status   MRSA by PCR NEGATIVE NEGATIVE Final    Comment:        The GeneXpert MRSA Assay (FDA approved for NASAL specimens only), is one component of a comprehensive MRSA colonization surveillance program. It is not intended to diagnose MRSA infection nor to guide or monitor treatment for MRSA infections. Performed at [facility], [place] [address] [place], [place] [address]     Procedures and diagnostic studies:  DG CHEST PORT 1 VIEW  Result Date: 02/21/2021 CLINICAL DATA:  Dyspnea. EXAM: PORTABLE CHEST 1 VIEW COMPARISON:  February 19, 2021. FINDINGS: Stable cardiomediastinal silhouette. No pneumothorax is noted. Bilateral coarse lung opacities are noted concerning for multifocal pneumonia or possibly edema. Small pleural effusions may be present. Bony thorax is unremarkable. IMPRESSION: Bilateral coarse lung opacities are noted concerning for multifocal pneumonia or  possibly edema. Small pleural effusions may be present. Electronically Signed   By: Marijo Conception M.D.   On: 02/21/2021 08:16   ECHOCARDIOGRAM COMPLETE  Result Date: 02/21/2021    ECHOCARDIOGRAM REPORT   Patient Name:   Brandon [name] Date of Exam: 02/21/2021 Medical Rec #:  [id number]     Height:       78.0 in Accession #:    [id number]    Weight:  168.7 lb Date of Birth:  24-Sep-1946     BSA:          2.100 m Patient Age:    80 years      BP:           108/69 mmHg Patient Gender: M             HR:           81 bpm. Exam Location:  Inpatient Procedure: 2D Echo, Cardiac Doppler and Color Doppler Indications:    CHF-Acute Diastolic P50.93  History:        Patient has prior history of Echocardiogram examinations, most                 recent 01/09/2021. CAD, COPD and PAD, Arrythmias:Atrial                 tachycardia; Risk Factors:Diabetes and Hypertension. GERD.  Sonographer:    Darlina Sicilian RDCS Referring Phys: [id number] Vayas  1. Left ventricular ejection fraction, by estimation, is 60 to 65%. The left ventricle has normal function. The left ventricle has no regional wall motion abnormalities. There is mild concentric left ventricular hypertrophy. Left ventricular diastolic parameters are consistent with Grade I diastolic dysfunction (impaired relaxation).  2. Right ventricular systolic function is mildly reduced. The right ventricular size is moderately enlarged. There is moderately elevated pulmonary artery systolic pressure.  3. Left atrial size was mild to moderately dilated.  4. Right atrial size was mildly dilated.  5. The mitral valve is degenerative. Trivial mitral valve regurgitation. No evidence of mitral stenosis.  6. The aortic valve is tricuspid. There is moderate calcification of the aortic valve. There is moderate thickening of the aortic valve. Aortic valve regurgitation is mild. Mild to moderate aortic valve sclerosis/calcification is present, without any evidence of  aortic stenosis.  7. Aortic dilatation noted. There is moderate dilatation of the ascending aorta, measuring 49 mm.  8. The inferior vena cava is dilated in size with <50% respiratory variability, suggesting right atrial pressure of 15 mmHg. Comparison(s): Changes from prior study are noted. Ascending aorta measures 4.9 cm in this study. May be oblique angle, recommend monitoring. Conclusion(s)/Recommendation(s): Otherwise normal echocardiogram, with minor abnormalities described in the report. FINDINGS  Left Ventricle: Left ventricular ejection fraction, by estimation, is 60 to 65%. The left ventricle has normal function. The left ventricle has no regional wall motion abnormalities. The left ventricular internal cavity size was normal in size. There is  mild concentric left ventricular hypertrophy. Left ventricular diastolic parameters are consistent with Grade I diastolic dysfunction (impaired relaxation). Right Ventricle: The right ventricular size is moderately enlarged. Right vetricular wall thickness was not well visualized. Right ventricular systolic function is mildly reduced. There is moderately elevated pulmonary artery systolic pressure. The tricuspid regurgitant velocity is 3.01 m/s, and with an assumed right atrial pressure of 15 mmHg, the estimated right ventricular systolic pressure is 80.9 mmHg. Left Atrium: Left atrial size was mild to moderately dilated. Right Atrium: Right atrial size was mildly dilated. Pericardium: There is no evidence of pericardial effusion. Mitral Valve: The mitral valve is degenerative in appearance. There is mild thickening of the mitral valve leaflet(s). There is mild calcification of the mitral valve leaflet(s). Mild to moderate mitral annular calcification. Trivial mitral valve regurgitation. No evidence of mitral valve stenosis. Tricuspid Valve: The tricuspid valve is normal in structure. Tricuspid valve regurgitation is mild . No evidence of tricuspid stenosis. Aortic  Valve: The  aortic valve is tricuspid. There is moderate calcification of the aortic valve. There is moderate thickening of the aortic valve. Aortic valve regurgitation is mild. Mild to moderate aortic valve sclerosis/calcification is present, without any evidence of aortic stenosis. Pulmonic Valve: The pulmonic valve was not well visualized. Pulmonic valve regurgitation is trivial. No evidence of pulmonic stenosis. Aorta: Aortic dilatation noted. There is moderate dilatation of the ascending aorta, measuring 49 mm. Venous: The inferior vena cava is dilated in size with less than 50% respiratory variability, suggesting right atrial pressure of 15 mmHg. IAS/Shunts: The atrial septum is grossly normal.  LEFT VENTRICLE PLAX 2D LVIDd:         4.40 cm  Diastology LVIDs:         3.30 cm  LV e' medial:    4.54 cm/s LV PW:         0.70 cm  LV E/e' medial:  12.3 LV IVS:        1.30 cm  LV e' lateral:   5.43 cm/s LVOT diam:     2.30 cm  LV E/e' lateral: 10.3 LV SV:         56 LV SV Index:   27 LVOT Area:     4.15 cm  RIGHT VENTRICLE RV S prime:     13.00 cm/s TAPSE (M-mode): 1.6 cm LEFT ATRIUM           Index       RIGHT ATRIUM           Index LA diam:      3.10 cm 1.48 cm/m  RA Area:     20.00 cm LA Vol (A2C): 51.7 ml 24.62 ml/m RA Volume:   53.60 ml  25.53 ml/m LA Vol (A4C): 71.2 ml 33.91 ml/m  AORTIC VALVE LVOT Vmax:   73.10 cm/s LVOT Vmean:  42.200 cm/s LVOT VTI:    0.135 m  AORTA Ao Root diam: 4.10 cm Ao Asc diam:  4.70 cm MITRAL VALVE               TRICUSPID VALVE MV Area (PHT): 2.95 cm    TR Peak grad:   36.2 mmHg MV Decel Time: 257 msec    TR Vmax:        301.00 cm/s MV E velocity: 55.70 cm/s MV A velocity: 81.80 cm/s  SHUNTS MV E/A ratio:  0.68        Systemic VTI:  0.14 m                            Systemic Diam: 2.30 cm Buford Dresser MD Electronically signed by Buford Dresser MD Signature Date/Time: 02/21/2021/6:22:23 PM    Final     Medications:   . (feeding supplement) PROSource Plus  30  mL Oral BID BM  . arformoterol  15 mcg Nebulization BID  . aspirin EC  81 mg Oral Daily  . atorvastatin  40 mg Oral QHS  . bisoprolol  5 mg Oral Daily  . budesonide (PULMICORT) nebulizer solution  0.5 mg Nebulization BID  . clopidogrel  75 mg Oral Daily  . diltiazem  240 mg Oral Daily  . enoxaparin (LOVENOX) injection  40 mg Subcutaneous Daily  . feeding supplement (GLUCERNA SHAKE)  237 mL Oral BID BM  . furosemide  40 mg Intravenous BID  . insulin aspart  0-15 Units Subcutaneous TID WC  . insulin aspart  0-5 Units Subcutaneous QHS  . insulin  aspart protamine- aspart  20 Units Subcutaneous BID WC  . ipratropium-albuterol  3 mL Nebulization BID  . magnesium oxide  400 mg Oral Daily  . multivitamin with minerals  1 tablet Oral Daily  . nortriptyline  50 mg Oral QHS  . oxybutynin  10 mg Oral Daily  . pantoprazole  40 mg Oral QAC breakfast  . predniSONE  40 mg Oral Q breakfast  . sodium chloride flush  3 mL Intravenous Q12H   Continuous Infusions: . sodium chloride    . azithromycin 500 mg (02/21/21 1019)  . cefTRIAXone (ROCEPHIN)  IV 1 g (02/22/21 0922)  . magnesium sulfate bolus IVPB       LOS: 3 days   Geradine Girt  [facility]   How to contact the [facility] Attending or Consulting provider [place] or covering provider during after hours [place], for this patient?  1. Check the care team in [facility] and look for a) attending/consulting TRH provider listed and b) the [facility] team listed 2. Log into [email or web address] and use [place]'s universal password to access. If you do not have the password, please contact the hospital operator. 3. Locate the [facility] provider you are looking for under [facility] and page to a number that you can be directly reached. 4. If you still have difficulty reaching the provider, please page the [facility] (Director on Call) for the Hospitalists listed on amion for assistance.  02/22/2021, 9:57 AM

## 2021-02-22 NOTE — Progress Notes (Signed)
Heart Failure Stewardship Pharmacist Progress Note   PCP: Pcp, No PCP-Cardiologist: No primary care provider on file.    HPI:  75 yo M with PMH of HTN, CAD, recent MI in January 2022, PVD, and T2DM. He presented to the ED on 02/19/21 with shortness of breath. He was admitted for acute on chronic respiratory failure secondary to COPD exacerbation, acute on chronic diastolic heart failure, and suspected healthcare associated pneumonia. An ECHO was done on 02/21/21 and LVEF was 60-65% (stable from last ECHO on 01/09/21 and 10/14/20).   Current HF Medications: Furosemide 40 mg IV BID Bisoprolol 5 mg daily  Prior to admission HF Medications: Furosemide 40 mg daily Bisoprolol 5 mg daily  Pertinent Lab Values: . Serum creatinine 1.02, BUN 28, Potassium 3.6, Sodium 137, BNP 159.7, Magnesium 1.8   Vital Signs: . Weight: 168 lbs (admission weight: 173 lbs) . Blood pressure: 110/70s  . Heart rate: 80s   Medication Assistance / Insurance Benefits Check: Does the patient have prescription insurance?  Yes Type of insurance plan: [place]  Outpatient Pharmacy:  Prior to admission outpatient pharmacy: [facility] Is the patient willing to use [place] at discharge? Yes Is the patient willing to transition their outpatient pharmacy to utilize a [facility] outpatient pharmacy?   No    Assessment: 1. Acute on chronic diastolic CHF (EF 46-96%). NYHA class II symptoms. - Continue furosemide 40 mg IV BID - Continue bisoprolol 5 mg daily - Consider starting Jardiance 10 mg daily for DM and HFpEF prior to discharge   Plan: 1) Medication changes recommended at this time: - Continue IV lasix; consider adding Jardiance once close to euvolemia  2) Patient assistance: - Vania Rea is preferred SGLT2i on [place]  3)  Education  - To be completed prior to discharge  Kerby Nora, PharmD, BCPS Heart Failure Cytogeneticist Phone [phone]

## 2021-02-22 NOTE — Progress Notes (Signed)
Heart Failure Navigator Progress Note  Assessed for Heart & Vascular TOC clinic readiness.  Unfortunately at this time the patient does not meet criteria due to pt declined.   HF pharmacist on board, optimizing medications. Pt has transportation to doctor appointments via the [place] and family. States he does not have social needs. Pt does not feel like he needs another appointment.  Navigator available for reassessment of patient.   Pricilla Holm, RN, BSN Heart Failure Nurse Navigator [phone]

## 2021-02-23 LAB — GLUCOSE, CAPILLARY
Glucose-Capillary: 145 mg/dL — ABNORMAL HIGH (ref 70–99)
Glucose-Capillary: 159 mg/dL — ABNORMAL HIGH (ref 70–99)
Glucose-Capillary: 226 mg/dL — ABNORMAL HIGH (ref 70–99)
Glucose-Capillary: 275 mg/dL — ABNORMAL HIGH (ref 70–99)

## 2021-02-23 LAB — BASIC METABOLIC PANEL WITH GFR
Anion gap: 9 (ref 5–15)
BUN: 20 mg/dL (ref 8–23)
CO2: 30 mmol/L (ref 22–32)
Calcium: 8.5 mg/dL — ABNORMAL LOW (ref 8.9–10.3)
Chloride: 98 mmol/L (ref 98–111)
Creatinine, Ser: 0.96 mg/dL (ref 0.61–1.24)
GFR, Estimated: >60 mL/min (ref >60)
Glucose, Bld: 156 mg/dL — ABNORMAL HIGH (ref 70–99)
Potassium: 3.3 mmol/L — ABNORMAL LOW (ref 3.5–5.1)
Sodium: 137 mmol/L (ref 135–145)

## 2021-02-23 MED ORDER — POTASSIUM CHLORIDE CRYS ER 20 MEQ PO TBCR
40.0000 meq | EXTENDED_RELEASE_TABLET | Freq: Once | ORAL | Status: AC
  Administered 2021-02-23: 40 meq via ORAL
  Filled 2021-02-23: qty 2

## 2021-02-23 NOTE — Progress Notes (Signed)
Heart Failure Stewardship Pharmacist Progress Note   PCP: Pcp, No PCP-Cardiologist: No primary care provider on file.    HPI:  75 yo M with PMH of HTN, CAD, recent MI in January 2022, PVD, and T2DM. He presented to the ED on 02/19/21 with shortness of breath. He was admitted for acute on chronic respiratory failure secondary to COPD exacerbation, acute on chronic diastolic heart failure, and suspected healthcare associated pneumonia. An ECHO was done on 02/21/21 and LVEF was 60-65% (stable from last ECHO on 01/09/21 and 10/14/20).   Current HF Medications: Furosemide 40 mg IV BID Bisoprolol 5 mg daily  Prior to admission HF Medications: Furosemide 40 mg daily Bisoprolol 5 mg daily  Pertinent Lab Values: . Serum creatinine 0.96, BUN 20, Potassium 3.3, Sodium 137, BNP 159.7, Magnesium 1.8    Vital Signs: . Weight: 171 lbs (admission weight: 173 lbs) . Blood pressure: 110-120s/70s  . Heart rate: 80s    Medication Assistance / Insurance Benefits Check: Does the patient have prescription insurance?  Yes Type of insurance plan: [place]  Outpatient Pharmacy:  Prior to admission outpatient pharmacy: [facility] Is the patient willing to use [place] at discharge? Yes Is the patient willing to transition their outpatient pharmacy to utilize a [facility] outpatient pharmacy?   No    Assessment: 1. Acute on chronic diastolic CHF (EF 76-72%). NYHA class II symptoms. - Continue furosemide 40 mg IV BID - Continue bisoprolol 5 mg daily - Consider starting Jardiance 10 mg daily for DM and HFpEF prior to discharge once euvolemic; 3 lbs weight gain today, 3.3 L of urine output from yesterday    Plan: 1) Medication changes recommended at this time: - Continue IV lasix - Consider adding Jardiance once close to euvolemia  2) Patient assistance: - Vania Rea is preferred SGLT2i on [place]  3)  Education  - To be completed prior to discharge  Kerby Nora, PharmD,  BCPS Heart Failure Cytogeneticist Phone [phone]

## 2021-02-23 NOTE — Progress Notes (Signed)
Occupational Therapy Treatment Patient Details Name: Brandon [name] MRN: [id number] DOB: 1946-02-14 Today's Date: 02/23/2021    History of present illness 75 year old male with hypertension, CAD, recent MI with stent placement January 2022, COPD on 4.5 L home oxygen, peripheral vascular disease, diabetes who comes in with shortness of breath. Hospitalized 2/2-2/7 for COPD and diastolic CHF. Returns to hospital 02/19/21 needing BiPAP, and then 10L O2. Admitted for treatment of Acute on chronic hypoxic respiratory failure due to acute pulmonary edema in the setting of acute on chronic diastolic CHF and COPD exacerbation, lobar pneumonia.   OT comments  Pt making steady progress towards OT goals this session. Session focus on functional mobility and BADL reeducation. Pt continues to present with decreased activity tolerance, dyspnea with exertion and generalized deconditioning. Pt able to complete functional mobility with RW ~ 48 ft with min guard assist however pt required 10 L HFNC with poor pleth reading however pt does endorse SOB during ambulation needing seated rest break post ambulation. Pt currently requires min guard for standing ADLs at sink needing seated rest break after standing at sink ~ 2 mins for ADLs. Able to titrate O2 back down to near baseline of O2 at 5L with sats 94% post session. Pt reports his daughter lives in [place] and comes down every Saturday to get his groceries and set him up for the week. Pt would continue to benefit from further education related to energy conservation and skilled occupational therapy while admitted and after d/c to address the below listed limitations in order to improve overall functional mobility and facilitate independence with BADL participation. DC plan remains appropriate, will follow acutely per POC.     Follow Up Recommendations  Home health OT    Equipment Recommendations  None recommended by OT    Recommendations for Other Services       Precautions / Restrictions Precautions Precautions: Fall;Other (comment) Precaution Comments: watch O2, 4/5 L at baseline Restrictions Weight Bearing Restrictions: No       Mobility Bed Mobility Overal bed mobility: Needs Assistance Bed Mobility: Supine to Sit     Supine to sit: HOB elevated;Supervision     General bed mobility comments: HOB elevated to 28*, supervision for safety and cues to shift weight anteriorly to place feet on floor    Transfers Overall transfer level: Needs assistance Equipment used: Rolling walker (2 wheeled) Transfers: Sit to/from Stand Sit to Stand: Min assist;Min guard;From elevated surface         General transfer comment: MIN A initially from elevated EOB ( to simulate home environement), cues for hand placement initially, Minguard assist as session progressed.    Balance Overall balance assessment: Needs assistance Sitting-balance support: Feet supported;No upper extremity supported Sitting balance-Leahy Scale: Good Sitting balance - Comments: sitting EOB with supervision   Standing balance support: Single extremity supported;During functional activity Standing balance-Leahy Scale: Poor Standing balance comment: at least one UE supported during ADLs                           ADL either performed or assessed with clinical judgement   ADL Overall ADL's : Needs assistance/impaired     Grooming: Wash/dry face;Standing;Min guard Grooming Details (indicate cue type and reason): min guard for balance while standing at sink         Upper Body Dressing : Set up;Sitting Upper Body Dressing Details (indicate cue type and reason): to don posterior Visual merchandiser  Transfer: Min guard;Ambulation;RW Toilet Transfer Details (indicate cue type and reason): simulated via functional mobility         Functional mobility during ADLs: Min guard;Rolling walker General ADL Comments: pt continues to present with dyspnea with exertion,  decreased activity tolerance and generalized deconditioning     Vision       Perception     Praxis      Cognition Arousal/Alertness: Awake/alert Behavior During Therapy: WFL for tasks assessed/performed Overall Cognitive Status: Within Functional Limits for tasks assessed                                          Exercises     Shoulder Instructions       General Comments pt on 5L HFNC at start of session with sats WFL, pt required an increase to 8L to ambulate to sink for grooming tasks, pt dropped to low 80s with minimally grooming task reporting needing to sit down post ADLs. pt required 10L HFNC to ambulate ~ 48 ft with RW, able to titrate pts O2 back down to 5L at end of session with O2 94% HR 89 bpm and BP 128/74 from recliner    Pertinent Vitals/ Pain       Pain Assessment: No/denies pain  Home Living                                          Prior Functioning/Environment              Frequency  Min 2X/week        Progress Toward Goals  OT Goals(current goals can now be found in the care plan section)  Progress towards OT goals: Progressing toward goals  Acute Rehab OT Goals Patient Stated Goal: return home OT Goal Formulation: With patient Time For Goal Achievement: 03/06/21 Potential to Achieve Goals: [place] Discharge plan remains appropriate;Frequency remains appropriate    Co-evaluation                 AM-PAC OT "6 Clicks" Daily Activity     Outcome Measure   Help from another person eating meals?: None Help from another person taking care of personal grooming?: A Little Help from another person toileting, which includes using toliet, bedpan, or urinal?: A Little Help from another person bathing (including washing, rinsing, drying)?: A Little Help from another person to put on and taking off regular upper body clothing?: None Help from another person to put on and taking off regular lower body  clothing?: A Little 6 Click Score: 20    End of Session Equipment Utilized During Treatment: Gait belt;Rolling walker;Other (comment) (5-10L HFNC)  OT Visit Diagnosis: Unsteadiness on feet (R26.81);Other abnormalities of gait and mobility (R26.89)   Activity Tolerance Patient tolerated treatment well   Patient Left in chair;with call bell/phone within reach   Nurse Communication Mobility status        Time: 1610-9604 OT Time Calculation (min): 27 min  Charges: OT General Charges $OT Visit: 1 Visit OT Treatments $Self Care/Home Management : 23-37 mins  Harley Alto., COTA/L Acute Rehabilitation Services [phone]    Precious Haws 02/23/2021, 8:55 AM

## 2021-02-23 NOTE — Progress Notes (Signed)
Progress Note    Brandon [name]  PFX:[id number] DOB: 17-Aug-1946  DOA: 02/19/2021 PCP: Pcp, No    Brief Narrative:     Medical records reviewed and are as summarized below:  Brandon [name] is an 75 y.o. male with medical history significant of HTN, CAD, recent MI with stent placement in 12/2020, COPD on  4.5 L of home O2, PVD, and DM presents with worsening shortness of breath.  History is somewhat limited as patient is currently on BiPAP.  Patient had just recently been hospitalized from 2/2-2/7 for COPD and diastolic congestive heart failure exacerbation.  Patient reported that he initially was doing well the first 3 to 4 days after getting discharged from the hospital.  However, since that time he had progressively worsening shortness of breath.   Assessment/Plan:   Principal Problem:   Acute on chronic respiratory failure with hypoxia (HCC) Active Problems:   Essential hypertension   Type 2 diabetes mellitus (HCC)   COPD with acute exacerbation (HCC)   History of CVA (cerebrovascular accident)   (HFpEF) heart failure with preserved ejection fraction (HCC)   Normocytic anemia   Protein-calorie malnutrition, severe    Acute on chronic hypoxic respiratory failure due to acute pulmonary edema in the setting of acute on chronic diastolic CHF and COPD exacerbation, lobar pneumonia -weaned to home O2 of 4.5L but increased to about 10L with ambulation -Renal function is stable, continue diuresis with IV Lasix -prior weight at d/c was 75.1-- currently 77.8 -he was placed on antibiotics, steroids, nebulizers continue.  Repeat chest x-ray again concerning for multifocal pneumonia or possibly edema.  Small effusions.  SARS-CoV-2, flu A & B were negative -upon discussion with patient he does not follow a  Low salt diet at home  Coronary artery disease -Recent non-STEMI in January with PCI to the proximal to mid LAD, orbital atherectomy and placement of drug-eluting stent stop troponin  negative -Continue Plavix, aspirin, statin, beta-blocker, diltiazem -echo unchanged from prior  Essential hypertension -Blood pressure stable on current regimen  Hyperlipidemia -continue statin  Paroxysmal atrial tachycardia -No reports of A. fib, anticoagulation was not advised by cardiology -Continue beta-blockers, calcium channel blockers  Tobacco abuse, in remission -He quit last year  Hypokalemia -Due to diuresis, replete  Type 2 diabetes mellitus, controlled, A1c 6.4 -Continue home insulin   Family Communication/Anticipated D/C date and plan/Code Status   DVT prophylaxis: Lovenox ordered. Code Status: Full Code.  Disposition Plan: Status is: Inpatient  Remains inpatient appropriate because:Inpatient level of care appropriate due to severity of illness   Dispo: The patient is from: Home              Anticipated d/c is to: Home              Anticipated d/c date is: 1 day              Patient currently is not medically stable to d/c.   Difficult to place patient No    Medical Consultants:    None.   Subjective:   Got very winded when he worked with PT/OT  Objective:    Vitals:   02/23/21 0737 02/23/21 0907 02/23/21 0910 02/23/21 0912  BP: 123/81 128/74    Pulse: 84 84    Resp: 15 17    Temp: 98.4 F (36.9 C)     TempSrc: Oral     SpO2: 93% 94% 94% 94%  Weight:        Intake/Output Summary (Last  24 hours) at 02/23/2021 1117 Last data filed at 02/23/2021 0800 Gross per 24 hour  Intake 1075 ml  Output 3350 ml  Net -2275 ml   Filed Weights   02/20/21 0500 02/21/21 0500 02/23/21 0420  Weight: 78.6 kg 76.5 kg 77.8 kg    Exam:  General: Appearance:    Thin male in no acute distress     Lungs:     On [place], respirations unlabored  Heart:    Normal heart rate. Normal rhythm. No murmurs, rubs, or gallops.   MS:   All extremities are intact.   Neurologic:   Awake, alert     Data Reviewed:   I have personally reviewed following labs  and imaging studies:  Labs: Labs show the following:   Basic Metabolic Panel: Recent Labs  Lab 02/19/21 0833 02/19/21 0842 02/20/21 0048 02/21/21 0058 02/22/21 0055 02/23/21 0617  NA 138 140 137 138 137 137  K 3.7 3.8 3.4* 3.4* 3.6 3.3*  CL 101  --  100 102 100 98  CO2 24  --  26 26 26 30   GLUCOSE 173*  --  308* 170* 206* 156*  BUN 16  --  20 23 28* 20  CREATININE 1.14  --  1.13 1.10 1.02 0.96  CALCIUM 8.8*  --  8.7* 8.7* 8.6* 8.5*  MG  --   --  1.7 1.8  --   --    GFR Estimated Creatinine Clearance: 73.2 mL/min (by C-G formula based on SCr of 0.96 mg/dL). Liver Function Tests: No results for input(s): AST, ALT, ALKPHOS, BILITOT, PROT, ALBUMIN in the last 168 hours. No results for input(s): LIPASE, AMYLASE in the last 168 hours. No results for input(s): AMMONIA in the last 168 hours. Coagulation profile No results for input(s): INR, PROTIME in the last 168 hours.  CBC: Recent Labs  Lab 02/19/21 0833 02/19/21 0842 02/22/21 0055  WBC 10.0  --  10.6*  NEUTROABS 7.4  --   --   HGB 11.9* 13.3 10.3*  HCT 38.5* 39.0 33.4*  MCV 85.2  --  83.9  PLT 351  --  337   Cardiac Enzymes: No results for input(s): CKTOTAL, CKMB, CKMBINDEX, TROPONINI in the last 168 hours. BNP (last 3 results) No results for input(s): PROBNP in the last 8760 hours. CBG: Recent Labs  Lab 02/22/21 0635 02/22/21 1051 02/22/21 1528 02/22/21 2105 02/23/21 0613  GLUCAP 169* 201* 140* 303* 145*   D-Dimer: Recent Labs    02/21/21 1127  DDIMER 0.74*   Hgb A1c: No results for input(s): HGBA1C in the last 72 hours. Lipid Profile: No results for input(s): CHOL, HDL, LDLCALC, TRIG, CHOLHDL, LDLDIRECT in the last 72 hours. Thyroid function studies: No results for input(s): TSH, T4TOTAL, T3FREE, THYROIDAB in the last 72 hours.  Invalid input(s): FREET3 Anemia work up: No results for input(s): VITAMINB12, FOLATE, FERRITIN, TIBC, IRON, RETICCTPCT in the last 72 hours. Sepsis Labs: Recent Labs   Lab 02/19/21 8469 02/19/21 1926 02/22/21 0055  PROCALCITON  --  <0.10  --   WBC 10.0  --  10.6*    Microbiology Recent Results (from the past 240 hour(s))  Resp Panel by RT-PCR (Flu A&B, Covid) Nasopharyngeal Swab     Status: None   Collection Time: 02/19/21  8:18 AM   Specimen: Nasopharyngeal Swab; Nasopharyngeal(NP) swabs in vial transport medium  Result Value Ref Range Status   SARS Coronavirus 2 by RT PCR NEGATIVE NEGATIVE Final    Comment: (NOTE) SARS-CoV-2 target nucleic  acids are NOT DETECTED.  The SARS-CoV-2 RNA is generally detectable in upper respiratory specimens during the acute phase of infection. The lowest concentration of SARS-CoV-2 viral copies this assay can detect is 138 copies/mL. A negative result does not preclude SARS-Cov-2 infection and should not be used as the sole basis for treatment or other patient management decisions. A negative result may occur with  improper specimen collection/handling, submission of specimen other than nasopharyngeal swab, presence of viral mutation(s) within the areas targeted by this assay, and inadequate number of viral copies(<138 copies/mL). A negative result must be combined with clinical observations, patient history, and epidemiological information. The expected result is Negative.  Fact Sheet for Patients:  EntrepreneurPulse.com.au  Fact Sheet for Healthcare Providers:  IncredibleEmployment.be  This test is no t yet approved or cleared by the [place] FDA and  has been authorized for detection and/or diagnosis of SARS-CoV-2 by FDA under an Emergency Use Authorization (EUA). This EUA will remain  in effect (meaning this test can be used) for the duration of the COVID-19 declaration under Section 564(b)(1) of the Act, 21 U.S.C.section 360bbb-3(b)(1), unless the authorization is terminated  or revoked sooner.       Influenza A by PCR NEGATIVE NEGATIVE Final   Influenza B  by PCR NEGATIVE NEGATIVE Final    Comment: (NOTE) The Xpert Xpress SARS-CoV-2/FLU/RSV plus assay is intended as an aid in the diagnosis of influenza from Nasopharyngeal swab specimens and should not be used as a sole basis for treatment. Nasal washings and aspirates are unacceptable for Xpert Xpress SARS-CoV-2/FLU/RSV testing.  Fact Sheet for Patients: EntrepreneurPulse.com.au  Fact Sheet for Healthcare Providers: IncredibleEmployment.be  This test is not yet approved or cleared by the [place] FDA and has been authorized for detection and/or diagnosis of SARS-CoV-2 by FDA under an Emergency Use Authorization (EUA). This EUA will remain in effect (meaning this test can be used) for the duration of the COVID-19 declaration under Section 564(b)(1) of the Act, 21 U.S.C. section 360bbb-3(b)(1), unless the authorization is terminated or revoked.  Performed at [facility], [place] [address] [place], [place] [address]   MRSA PCR Screening     Status: None   Collection Time: 02/19/21  7:18 PM   Specimen: Nasal Mucosa; Nasopharyngeal  Result Value Ref Range Status   MRSA by PCR NEGATIVE NEGATIVE Final    Comment:        The GeneXpert MRSA Assay (FDA approved for NASAL specimens only), is one component of a comprehensive MRSA colonization surveillance program. It is not intended to diagnose MRSA infection nor to guide or monitor treatment for MRSA infections. Performed at [facility], [place] [address], [place], [place] [address]     Procedures and diagnostic studies:  ECHOCARDIOGRAM COMPLETE  Result Date: 02/21/2021    ECHOCARDIOGRAM REPORT   Patient Name:   BARRETT GOLDIE Date of Exam: 02/21/2021 Medical Rec #:  [id number]     Height:       78.0 in Accession #:    [id number]    Weight:       168.7 lb Date of Birth:  [birth year]     BSA:          2.100 m Patient Age:    68 years      BP:           108/69 mmHg Patient Gender: M              HR:  81 bpm. Exam Location:  Inpatient Procedure: 2D Echo, Cardiac Doppler and Color Doppler Indications:    CHF-Acute Diastolic G25.42  History:        Patient has prior history of Echocardiogram examinations, most                 recent 01/09/2021. CAD, COPD and PAD, Arrythmias:Atrial                 tachycardia; Risk Factors:Diabetes and Hypertension. GERD.  Sonographer:    Darlina Sicilian RDCS Referring Phys: [id number] [place]  1. Left ventricular ejection fraction, by estimation, is 60 to 65%. The left ventricle has normal function. The left ventricle has no regional wall motion abnormalities. There is mild concentric left ventricular hypertrophy. Left ventricular diastolic parameters are consistent with Grade I diastolic dysfunction (impaired relaxation).  2. Right ventricular systolic function is mildly reduced. The right ventricular size is moderately enlarged. There is moderately elevated pulmonary artery systolic pressure.  3. Left atrial size was mild to moderately dilated.  4. Right atrial size was mildly dilated.  5. The mitral valve is degenerative. Trivial mitral valve regurgitation. No evidence of mitral stenosis.  6. The aortic valve is tricuspid. There is moderate calcification of the aortic valve. There is moderate thickening of the aortic valve. Aortic valve regurgitation is mild. Mild to moderate aortic valve sclerosis/calcification is present, without any evidence of aortic stenosis.  7. Aortic dilatation noted. There is moderate dilatation of the ascending aorta, measuring 49 mm.  8. The inferior vena cava is dilated in size with <50% respiratory variability, suggesting right atrial pressure of 15 mmHg. Comparison(s): Changes from prior study are noted. Ascending aorta measures 4.9 cm in this study. May be oblique angle, recommend monitoring. Conclusion(s)/Recommendation(s): Otherwise normal echocardiogram, with minor abnormalities described in the report.  FINDINGS  Left Ventricle: Left ventricular ejection fraction, by estimation, is 60 to 65%. The left ventricle has normal function. The left ventricle has no regional wall motion abnormalities. The left ventricular internal cavity size was normal in size. There is  mild concentric left ventricular hypertrophy. Left ventricular diastolic parameters are consistent with Grade I diastolic dysfunction (impaired relaxation). Right Ventricle: The right ventricular size is moderately enlarged. Right vetricular wall thickness was not well visualized. Right ventricular systolic function is mildly reduced. There is moderately elevated pulmonary artery systolic pressure. The tricuspid regurgitant velocity is 3.01 m/s, and with an assumed right atrial pressure of 15 mmHg, the estimated right ventricular systolic pressure is 28.3 mmHg. Left Atrium: Left atrial size was mild to moderately dilated. Right Atrium: Right atrial size was mildly dilated. Pericardium: There is no evidence of pericardial effusion. Mitral Valve: The mitral valve is degenerative in appearance. There is mild thickening of the mitral valve leaflet(s). There is mild calcification of the mitral valve leaflet(s). Mild to moderate mitral annular calcification. Trivial mitral valve regurgitation. No evidence of mitral valve stenosis. Tricuspid Valve: The tricuspid valve is normal in structure. Tricuspid valve regurgitation is mild . No evidence of tricuspid stenosis. Aortic Valve: The aortic valve is tricuspid. There is moderate calcification of the aortic valve. There is moderate thickening of the aortic valve. Aortic valve regurgitation is mild. Mild to moderate aortic valve sclerosis/calcification is present, without any evidence of aortic stenosis. Pulmonic Valve: The pulmonic valve was not well visualized. Pulmonic valve regurgitation is trivial. No evidence of pulmonic stenosis. Aorta: Aortic dilatation noted. There is moderate dilatation of the ascending  aorta, measuring 49 mm. Venous:  The inferior vena cava is dilated in size with less than 50% respiratory variability, suggesting right atrial pressure of 15 mmHg. IAS/Shunts: The atrial septum is grossly normal.  LEFT VENTRICLE PLAX 2D LVIDd:         4.40 cm  Diastology LVIDs:         3.30 cm  LV e' medial:    4.54 cm/s LV PW:         0.70 cm  LV E/e' medial:  12.3 LV IVS:        1.30 cm  LV e' lateral:   5.43 cm/s LVOT diam:     2.30 cm  LV E/e' lateral: 10.3 LV SV:         56 LV SV Index:   27 LVOT Area:     4.15 cm  RIGHT VENTRICLE RV S prime:     13.00 cm/s TAPSE (M-mode): 1.6 cm LEFT ATRIUM           Index       RIGHT ATRIUM           Index LA diam:      3.10 cm 1.48 cm/m  RA Area:     20.00 cm LA Vol (A2C): 51.7 ml 24.62 ml/m RA Volume:   53.60 ml  25.53 ml/m LA Vol (A4C): 71.2 ml 33.91 ml/m  AORTIC VALVE LVOT Vmax:   73.10 cm/s LVOT Vmean:  42.200 cm/s LVOT VTI:    0.135 m  AORTA Ao Root diam: 4.10 cm Ao Asc diam:  4.70 cm MITRAL VALVE               TRICUSPID VALVE MV Area (PHT): 2.95 cm    TR Peak grad:   36.2 mmHg MV Decel Time: 257 msec    TR Vmax:        301.00 cm/s MV E velocity: 55.70 cm/s MV A velocity: 81.80 cm/s  SHUNTS MV E/A ratio:  0.68        Systemic VTI:  0.14 m                            Systemic Diam: 2.30 cm Buford Dresser MD Electronically signed by Buford Dresser MD Signature Date/Time: 02/21/2021/6:22:23 PM    Final     Medications:   . (feeding supplement) PROSource Plus  30 mL Oral BID BM  . arformoterol  15 mcg Nebulization BID  . aspirin EC  81 mg Oral Daily  . atorvastatin  40 mg Oral QHS  . bisoprolol  5 mg Oral Daily  . budesonide (PULMICORT) nebulizer solution  0.5 mg Nebulization BID  . clopidogrel  75 mg Oral Daily  . diltiazem  240 mg Oral Daily  . enoxaparin (LOVENOX) injection  40 mg Subcutaneous Daily  . feeding supplement (GLUCERNA SHAKE)  237 mL Oral BID BM  . furosemide  40 mg Intravenous BID  . insulin aspart  0-15 Units Subcutaneous  TID WC  . insulin aspart  0-5 Units Subcutaneous QHS  . insulin aspart protamine- aspart  20 Units Subcutaneous BID WC  . ipratropium-albuterol  3 mL Nebulization BID  . magnesium oxide  400 mg Oral Daily  . multivitamin with minerals  1 tablet Oral Daily  . nortriptyline  50 mg Oral QHS  . oxybutynin  10 mg Oral Daily  . pantoprazole  40 mg Oral QAC breakfast  . potassium chloride  40 mEq Oral Once  . predniSONE  40 mg Oral Q breakfast  . sodium chloride flush  3 mL Intravenous Q12H   Continuous Infusions: . sodium chloride    . azithromycin 500 mg (02/23/21 1112)     LOS: 4 days   Geradine Girt  [facility]   How to contact the [facility] Attending or Consulting Dale Strausser [place] or covering Wyoma Genson during after hours [place], for this patient?  1. Check the care team in [facility] and look for a) attending/consulting [facility] [place] listed and b) the [facility] team listed 2. Log into [email or web address] and use [employer]'s universal password to access. If you do not have the password, please contact the hospital operator. 3. Locate the [facility] Loyce Klasen you are looking for under [facility] and page to a number that you can be directly reached. 4. If you still have difficulty reaching the Zaia Carre, please page the [facility] (Director on Call) for the Hospitalists listed on amion for assistance.  02/23/2021, 11:17 AM

## 2021-02-24 ENCOUNTER — Other Ambulatory Visit: Payer: Self-pay | Admitting: Internal Medicine

## 2021-02-24 LAB — BASIC METABOLIC PANEL
Anion gap: 9 (ref 5–15)
BUN: 21 mg/dL (ref 8–23)
CO2: 27 mmol/L (ref 22–32)
Calcium: 8.4 mg/dL — ABNORMAL LOW (ref 8.9–10.3)
Chloride: 99 mmol/L (ref 98–111)
Creatinine, Ser: 0.84 mg/dL (ref 0.61–1.24)
GFR, Estimated: >60 mL/min (ref >60)
Glucose, Bld: 201 mg/dL — ABNORMAL HIGH (ref 70–99)
Potassium: 3.9 mmol/L (ref 3.5–5.1)
Sodium: 135 mmol/L (ref 135–145)

## 2021-02-24 LAB — GLUCOSE, CAPILLARY
Glucose-Capillary: 201 mg/dL — ABNORMAL HIGH (ref 70–99)
Glucose-Capillary: 202 mg/dL — ABNORMAL HIGH (ref 70–99)

## 2021-02-24 MED ORDER — FUROSEMIDE 40 MG PO TABS: 40.0000 mg | ORAL_TABLET | Freq: Two times a day (BID) | ORAL | 0 refills | Status: DC

## 2021-02-24 MED ORDER — POTASSIUM CHLORIDE ER 10 MEQ PO TBCR: 20.0000 meq | EXTENDED_RELEASE_TABLET | Freq: Every day | ORAL | 0 refills | Status: DC

## 2021-02-24 MED ORDER — EMPAGLIFLOZIN 10 MG PO TABS: 10.0000 mg | ORAL_TABLET | Freq: Every day | ORAL | 1 refills | Status: DC

## 2021-02-24 MED ORDER — EMPAGLIFLOZIN 10 MG PO TABS: 10.0000 mg | ORAL_TABLET | Freq: Every day | ORAL | 0 refills | Status: DC

## 2021-02-24 MED ORDER — EMPAGLIFLOZIN 10 MG PO TABS
10.0000 mg | ORAL_TABLET | Freq: Every day | ORAL | Status: DC
  Administered 2021-02-24: 10 mg via ORAL
  Filled 2021-02-24 (×2): qty 1

## 2021-02-24 MED FILL — FUROSEMIDE 40 MG TABLET: 40 | 30 days supply | Qty: 60 | Fill #0

## 2021-02-24 MED FILL — JARDIANCE 10 MG TABLET: 10 | 14 days supply | Qty: 14 | Fill #0

## 2021-02-24 MED FILL — POTASSIUM CHL ER M10 TABLET: 10 | 30 days supply | Qty: 60 | Fill #0

## 2021-02-24 NOTE — Progress Notes (Signed)
Heart Failure Stewardship Pharmacist Progress Note   PCP: Pcp, No PCP-Cardiologist: No primary care provider on file.    HPI:  75 yo M with PMH of HTN, CAD, recent MI in January 2022, PVD, and T2DM. He presented to the ED on 02/19/21 with shortness of breath. He was admitted for acute on chronic respiratory failure secondary to COPD exacerbation, acute on chronic diastolic heart failure, and suspected healthcare associated pneumonia. An ECHO was done on 02/21/21 and LVEF was 60-65% (stable from last ECHO on 01/09/21 and 10/14/20).   Current HF Medications: Furosemide 40 mg IV BID Bisoprolol 5 mg daily  Prior to admission HF Medications: Furosemide 40 mg daily Bisoprolol 5 mg daily  Pertinent Lab Values: . Serum creatinine 0.84, BUN 21, Potassium 3.9, Sodium 135, BNP 159.7, Magnesium 1.8    Vital Signs: . Weight: 166 lbs (admission weight: 173 lbs) . Blood pressure: 130/70s . Heart rate: 80s    Medication Assistance / Insurance Benefits Check: Does the patient have prescription insurance?  Yes Type of insurance plan: [place]  Outpatient Pharmacy:  Prior to admission outpatient pharmacy: [facility] Is the patient willing to use [facility] at discharge? Yes Is the patient willing to transition their outpatient pharmacy to utilize a [facility] outpatient pharmacy?   No    Assessment: 1. Acute on chronic diastolic CHF (EF 38-45%). NYHA class II symptoms. - On furosemide 40 mg IV BID - discussed with MD and plan to switch to PO later today - Continue bisoprolol 5 mg daily - Consider starting Jardiance 10 mg daily for DM and HFpEF   Plan: 1) Medication changes recommended at this time: - Start Jardiance 10 mg daily  2) Patient assistance: - Vania Rea is preferred SGLT2i on [place], PharmD, BCPS Heart Failure Cytogeneticist Phone [phone]

## 2021-02-24 NOTE — Discharge Summary (Signed)
Physician Discharge Summary  Brandon [name] PFX:[id number] DOB: [birth year] DOA: 02/19/2021  PCP: Pcp, No-- goes to the [facility] date: 02/19/2021 Discharge date: 02/24/2021  Admitted From: home Discharge disposition: home   Recommendations for Outpatient Follow-Up:   Consider palliative care referral Home health BMP 1 week Patient to weigh daily- lasix dose increased jardiance added  Discharge Diagnosis:   Principal Problem:   Acute on chronic respiratory failure with hypoxia ([place]) Active Problems:   Essential hypertension   Type 2 diabetes mellitus ([place])   COPD with acute exacerbation ([place])   History of CVA (cerebrovascular accident)   (HFpEF) heart failure with preserved ejection fraction ([place])   Normocytic anemia   Protein-calorie malnutrition, severe    Discharge Condition: Improved.  Diet recommendation: Low sodium, heart healthy.  Carbohydrate-modified.  Wound care: None.  Code status: Full.   History of Present Illness:   Brandon [name] is a 75 y.o. male with medical history significant of HTN, CAD, recent MI with stent placement in 12/2020, COPD on  4.5 L of home O2, PVD, and DM presents with worsening shortness of breath.  History is somewhat limited as patient is currently on BiPAP.  Patient had just recently been hospitalized from 2/2-2/7 for COPD and diastolic congestive heart failure exacerbation.  Patient reported that he initially was doing well the first 3 to 4 days after getting discharged from the hospital.  However, since that time he had progressively worsening shortness of breath.  Patient reports that he has been compliant with medications and inhalers.  Symptoms got to the point which she was unable to walk even a few feet without feeling completely out of breath.  He chronically reports having lower extremity swelling that comes and goes.  Denies chest pain, cough, wheezing, fever, nausea, vomiting, or diarrhea.  At baseline he lives alone af  ter losing his wife 3 years ago and his daughter lives in [place], [place]. Patient reports that he has not smoked any cigarettes since August of last year  In route with EMS patient's O2 saturations were 52% on home nasal cannula oxygen. Patient was given DuoNeb breathing treatments and Solu-Medrol 125 mg IV.  Patient was initially noted to be tachycardic to the 160s and hypertensive with blood pressure 160/110.    Hospital Course by Problem:   Acute on chronic hypoxic respiratory failure due to acute pulmonary edema in the setting of acute on chronic diastolic CHF and COPD exacerbation, lobar pneumonia -weaned to home O2 of 4.5L  -Renal function is stable,increase home diuresis -prior weight at d/c was 75.1 -he was placed on antibiotics, steroids, nebulizers continue.Repeat chest x-ray again concerning for multifocal pneumonia or possibly edema. Small effusions. SARS-CoV-2, flu A &B were negative -upon discussion with patient he does not follow a  Low salt diet at home  Coronary artery disease -Recent non-STEMI in January with PCI to the proximal to mid LAD, orbital atherectomy and placement of drug-eluting stent stop troponin negative -Continue Plavix, aspirin, statin, beta-blocker, diltiazem -echo unchanged from prior  Essential hypertension -Blood pressure stable on current regimen  Hyperlipidemia -continue statin  Paroxysmal atrial tachycardia -No reports of A. fib, anticoagulation was not advised by cardiology -Continue beta-blockers, calcium channel blockers  Tobacco abuse, in remission -He quit last year  Hypokalemia -Due to diuresis, replete  Type 2 diabetes mellitus, controlled, A1c 6.4 -Continue home insulin     Medical Consultants:      Discharge Exam:   Vitals:  02/24/21 0913 02/24/21 0915  BP:    Pulse:    Resp:    Temp:    SpO2: 98% 98%   Vitals:   02/24/21 0725 02/24/21 0911 02/24/21 0913 02/24/21 0915  BP: 131/80      Pulse: 80 82    Resp: 17 17    Temp: 97.8 F (36.6 C)     TempSrc: Oral     SpO2: 93% 97% 98% 98%  Weight:        General exam: Appears calm and comfortable.   The results of significant diagnostics from this hospitalization (including imaging, microbiology, ancillary and laboratory) are listed below for reference.     Procedures and Diagnostic Studies:   DG Chest Port 1 View  Result Date: 02/19/2021 CLINICAL DATA:  Shortness of breath. EXAM: PORTABLE CHEST 1 VIEW COMPARISON:  02/01/2021 FINDINGS: Heart size and mediastinal contours are normal. Aortic atherosclerosis. Small bilateral pleural effusions, unchanged. Mild diffuse increase interstitial markings identified compatible with interstitial edema. More focal airspace disease within the right midlung is identified which may represent an area of pneumonia or asymmetric edema. IMPRESSION: 1. CHF pattern. 2. More focal airspace disease within the right midlung may represent pneumonia or asymmetric edema. Electronically Signed   By: Kerby Moors M.D.   On: 02/19/2021 09:06     Labs:   Basic Metabolic Panel: Recent Labs  Lab 02/20/21 0048 02/21/21 0058 02/22/21 0055 02/23/21 0617 02/24/21 0450  NA 137 138 137 137 135  K 3.4* 3.4* 3.6 3.3* 3.9  CL 100 102 100 98 99  CO2 26 26 26 30 27   GLUCOSE 308* 170* 206* 156* 201*  BUN 20 23 28* 20 21  CREATININE 1.13 1.10 1.02 0.96 0.84  CALCIUM 8.7* 8.7* 8.6* 8.5* 8.4*  MG 1.7 1.8  --   --   --    GFR Estimated Creatinine Clearance: 81 mL/min (by C-G formula based on SCr of 0.84 mg/dL). Liver Function Tests: No results for input(s): AST, ALT, ALKPHOS, BILITOT, PROT, ALBUMIN in the last 168 hours. No results for input(s): LIPASE, AMYLASE in the last 168 hours. No results for input(s): AMMONIA in the last 168 hours. Coagulation profile No results for input(s): INR, PROTIME in the last 168 hours.  CBC: Recent Labs  Lab 02/19/21 0833 02/19/21 0842 02/22/21 0055  WBC 10.0   --  10.6*  NEUTROABS 7.4  --   --   HGB 11.9* 13.3 10.3*  HCT 38.5* 39.0 33.4*  MCV 85.2  --  83.9  PLT 351  --  337   Cardiac Enzymes: No results for input(s): CKTOTAL, CKMB, CKMBINDEX, TROPONINI in the last 168 hours. BNP: Invalid input(s): POCBNP CBG: Recent Labs  Lab 02/23/21 0613 02/23/21 1130 02/23/21 1634 02/23/21 2202 02/24/21 0620  GLUCAP 145* 159* 226* 275* 201*   D-Dimer Recent Labs    02/21/21 1127  DDIMER 0.74*   Hgb A1c No results for input(s): HGBA1C in the last 72 hours. Lipid Profile No results for input(s): CHOL, HDL, LDLCALC, TRIG, CHOLHDL, LDLDIRECT in the last 72 hours. Thyroid function studies No results for input(s): TSH, T4TOTAL, T3FREE, THYROIDAB in the last 72 hours.  Invalid input(s): FREET3 Anemia work up No results for input(s): VITAMINB12, FOLATE, FERRITIN, TIBC, IRON, RETICCTPCT in the last 72 hours. Microbiology Recent Results (from the past 240 hour(s))  Resp Panel by RT-PCR (Flu A&B, Covid) Nasopharyngeal Swab     Status: None   Collection Time: 02/19/21  8:18 AM   Specimen: Nasopharyngeal  Swab; Nasopharyngeal(NP) swabs in vial transport medium  Result Value Ref Range Status   SARS Coronavirus 2 by RT PCR NEGATIVE NEGATIVE Final    Comment: (NOTE) SARS-CoV-2 target nucleic acids are NOT DETECTED.  The SARS-CoV-2 RNA is generally detectable in upper respiratory specimens during the acute phase of infection. The lowest concentration of SARS-CoV-2 viral copies this assay can detect is 138 copies/mL. A negative result does not preclude SARS-Cov-2 infection and should not be used as the sole basis for treatment or other patient management decisions. A negative result may occur with  improper specimen collection/handling, submission of specimen other than nasopharyngeal swab, presence of viral mutation(s) within the areas targeted by this assay, and inadequate number of viral copies(<138 copies/mL). A negative result must be  combined with clinical observations, patient history, and epidemiological information. The expected result is Negative.  Fact Sheet for Patients:  EntrepreneurPulse.com.au  Fact Sheet for Healthcare Providers:  IncredibleEmployment.be  This test is no t yet approved or cleared by the [place] FDA and  has been authorized for detection and/or diagnosis of SARS-CoV-2 by FDA under an Emergency Use Authorization (EUA). This EUA will remain  in effect (meaning this test can be used) for the duration of the COVID-19 declaration under Section 564(b)(1) of the Act, 21 U.S.C.section 360bbb-3(b)(1), unless the authorization is terminated  or revoked sooner.       Influenza A by PCR NEGATIVE NEGATIVE Final   Influenza B by PCR NEGATIVE NEGATIVE Final    Comment: (NOTE) The Xpert Xpress SARS-CoV-2/FLU/RSV plus assay is intended as an aid in the diagnosis of influenza from Nasopharyngeal swab specimens and should not be used as a sole basis for treatment. Nasal washings and aspirates are unacceptable for Xpert Xpress SARS-CoV-2/FLU/RSV testing.  Fact Sheet for Patients: EntrepreneurPulse.com.au  Fact Sheet for Healthcare Providers: IncredibleEmployment.be  This test is not yet approved or cleared by the [place] FDA and has been authorized for detection and/or diagnosis of SARS-CoV-2 by FDA under an Emergency Use Authorization (EUA). This EUA will remain in effect (meaning this test can be used) for the duration of the COVID-19 declaration under Section 564(b)(1) of the Act, 21 U.S.C. section 360bbb-3(b)(1), unless the authorization is terminated or revoked.  Performed at [facility], [place] [address] [place], [place] [address]   MRSA PCR Screening     Status: None   Collection Time: 02/19/21  7:18 PM   Specimen: Nasal Mucosa; Nasopharyngeal  Result Value Ref Range Status   MRSA by PCR NEGATIVE  NEGATIVE Final    Comment:        The GeneXpert MRSA Assay (FDA approved for NASAL specimens only), is one component of a comprehensive MRSA colonization surveillance program. It is not intended to diagnose MRSA infection nor to guide or monitor treatment for MRSA infections. Performed at [facility], [place] [address], [place], [place] [address]      Discharge Instructions:   Discharge Instructions    (HEART FAILURE PATIENTS) Call MD:  Anytime you have any of the following symptoms: 1) 3 pound weight gain in 24 hours or 5 pounds in 1 week 2) shortness of breath, with or without a dry hacking cough 3) swelling in the hands, feet or stomach 4) if you have to sleep on extra pillows at night in order to breathe.   Complete by: As directed    Diet - low sodium heart healthy   Complete by: As directed    Discharge instructions  Complete by: As directed    BMP 1 week Discharge weight is 166lbs   Heart Failure patients record your daily weight using the same scale at the same time of day   Complete by: As directed    Increase activity slowly   Complete by: As directed      Allergies as of 02/24/2021      Reactions   Furosemide Swelling, Other (See Comments)   Cramping, also (patient disputes this in 2022)   Hydrochlorothiazide Other (See Comments)   Hypokalemia (patient disputes this in 2022)      Medication List    STOP taking these medications   cyanocobalamin 1000 MCG tablet   ferrous sulfate 325 (65 FE) MG tablet   polyethylene glycol 17 g packet Commonly known as: MIRALAX / GLYCOLAX     TAKE these medications   acetaminophen 325 MG tablet Commonly known as: TYLENOL Take 2 tablets (650 mg total) by mouth every 6 (six) hours as needed for mild pain (or Fever >/= 101).   albuterol 108 (90 Base) MCG/ACT inhaler Commonly known as: VENTOLIN HFA Inhale 2 puffs into the lungs every 4 (four) hours as needed for wheezing or shortness of breath.   aspirin 81 MG EC  tablet Take 1 tablet (81 mg total) by mouth daily.   atorvastatin 40 MG tablet Commonly known as: LIPITOR Take 40 mg by mouth at bedtime.   bisoprolol 5 MG tablet Commonly known as: ZEBETA Take 1 tablet (5 mg total) by mouth daily.   clopidogrel 75 MG tablet Commonly known as: PLAVIX Take 1 tablet (75 mg total) by mouth daily.   diltiazem 240 MG 24 hr capsule Commonly known as: CARDIZEM CD Take 1 capsule (240 mg total) by mouth daily.   empagliflozin 10 MG Tabs tablet Commonly known as: JARDIANCE Take 1 tablet (10 mg total) by mouth daily.   empagliflozin 10 MG Tabs tablet Commonly known as: Jardiance Take 1 tablet (10 mg total) by mouth daily before breakfast. Start taking on: March 10, 2021   feeding supplement (GLUCERNA SHAKE) Liqd Take 237 mLs by mouth 2 (two) times daily between meals. What changed: when to take this   Fluticasone-Salmeterol 250-50 MCG/DOSE Aepb Commonly known as: ADVAIR Inhale 1 puff into the lungs 2 (two) times daily.   furosemide 40 MG tablet Commonly known as: LASIX Take 1 tablet (40 mg total) by mouth 2 (two) times daily. What changed: when to take this   insulin aspart protamine - aspart (70-30) 100 UNIT/ML FlexPen Commonly known as: NOVOLOG 70/30 MIX Inject 0.2 mLs (20 Units total) into the skin 2 (two) times daily with a meal.   magnesium oxide 400 (241.3 Mg) MG tablet Commonly known as: MAG-OX Take 1 tablet (400 mg total) by mouth daily.   metFORMIN 1000 MG tablet Commonly known as: GLUCOPHAGE Take 1,000 mg by mouth 2 (two) times daily with a meal.   multivitamin with minerals Tabs tablet Take 1 tablet by mouth daily.   nortriptyline 50 MG capsule Commonly known as: PAMELOR Take 50 mg by mouth at bedtime.   Omega-3 1000 MG Caps Take 1,000 mg by mouth in the morning and at bedtime.   oxybutynin 10 MG 24 hr tablet Commonly known as: DITROPAN-XL Take 10 mg by mouth daily.   OXYGEN Inhale 4.5 L/min into the lungs  continuous.   pantoprazole 40 MG tablet Commonly known as: PROTONIX Take 1 tablet (40 mg total) by mouth daily. What changed: when to take this  potassium chloride 10 MEQ tablet Commonly known as: KLOR-CON Take 2 tablets (20 mEq total) by mouth daily.   senna-docusate 8.6-50 MG tablet Commonly known as: Senokot-S Take 1 tablet by mouth at bedtime as needed for mild constipation. What changed: when to take this   Tiotropium Bromide Monohydrate 2.5 MCG/ACT Aers Inhale 2 puffs into the lungs daily.       Follow-up Information    [facility]-Home Follow up.   Specialty: [place] Why: HHRN,HHPT,HHOT               Time coordinating discharge: 35 min  Signed:  Geradine Girt DO  [facility] 02/24/2021, 10:54 AM

## 2021-02-24 NOTE — Progress Notes (Signed)
Inpatient Diabetes Program Recommendations  AACE/ADA: New Consensus Statement on Inpatient Glycemic Control (2015)  Target Ranges:  Prepandial:   less than 140 mg/dL      Peak postprandial:   less than 180 mg/dL (1-2 hours)      Critically ill patients:  140 - 180 mg/dL   Lab Results  Component Value Date   GLUCAP 201 (H) 02/24/2021   HGBA1C 6.4 (H) 01/07/2021    Review of Glycemic Control Results for Brandon [name], Brandon [name] (MRN [id number]) as of 02/24/2021 08:53  Ref. Range 02/23/2021 06:13 02/23/2021 11:30 02/23/2021 16:34 02/23/2021 22:02 02/24/2021 06:20  Glucose-Capillary Latest Ref Range: 70 - 99 mg/dL 145 (H) 159 (H) 226 (H) 275 (H) 201 (H)   Diabetes history: DM 2 Outpatient Diabetes medications: 70/30 20 units bid + Metformin 1 gm bid  Current orders for Inpatient glycemic control:  70/30 20 units bid Novolog 0-15 units tid + hs  PO prednisone 40 mg Daily  Inpatient Diabetes Program Recommendations:    If steroid dose remains the same -  Increase 70/30 to 23 units bid  Thanks,  Tama Headings RN, MSN, BC-ADM Inpatient Diabetes Coordinator Team Pager [phone] (8a-5p)

## 2021-02-24 NOTE — Progress Notes (Signed)
Patient discharge instructions reviewed and patient verbalizes understanding of new medications and follow up.  Patient via wheelchair to daughter's waiting vehicle in stable condition

## 2021-02-24 NOTE — Progress Notes (Signed)
Physical Therapy Treatment Patient Details Name: Brandon [name] MRN: [id number] DOB: 1946-09-08 Today's Date: 02/24/2021    History of Present Illness 75 year old male with hypertension, CAD, recent MI with stent placement January 2022, COPD on 4.5 L home oxygen, peripheral vascular disease, diabetes who comes in with shortness of breath. Hospitalized 2/2-2/7 for COPD and diastolic CHF. Returns to hospital 02/19/21 needing BiPAP, and then 10L O2. Admitted for treatment of Acute on chronic hypoxic respiratory failure due to acute pulmonary edema in the setting of acute on chronic diastolic CHF and COPD exacerbation, lobar pneumonia.    PT Comments    Pt leaving today, with goal of getting dressed and ambulating in room with PT. Pt able to dress with light assist from PT for steadying in standing tasks, pt exhibiting mild dyspnea on exertion with pulse ox not picking up sats even when attempting on multiple fingers and warming fingers up. Pt ambulatory in room on 4LO2, and reports breathing feels "like normal". Pt reports he will continue to monitor sats at home during mobility, and if pt develops dyspnea with activity, PT encouraged pt to rest and perform pursed lip breathing technique. Pt demos proper pursed lip breathing technique when cued by PT. Pt to d/c home today, declines need to practice steps as pt's daughter will help him in the house.     Follow Up Recommendations  Home health PT;Supervision for mobility/OOB     Equipment Recommendations  Other (comment) (Rollator)    Recommendations for Other Services       Precautions / Restrictions Precautions Precautions: Fall;Other (comment) Precaution Comments: watch O2, 4/5 L at baseline    Mobility  Bed Mobility               General bed mobility comments: up in chair upon PT arrival to room, requesting stay in chair on PT exit    Transfers Overall transfer level: Needs assistance Equipment used: Rolling walker (2  wheeled) Transfers: Sit to/from Stand Sit to Stand: Supervision         General transfer comment: for safety, STS x2 during dressing tasks  Ambulation/Gait Ambulation/Gait assistance: Supervision Gait Distance (Feet): 25 Feet Assistive device: Rolling walker (2 wheeled) Gait Pattern/deviations: Step-through pattern;Decreased stride length;Trunk flexed Gait velocity: decr   General Gait Details: supervision for safety, verbal cuing to keep RW on ground during directional changes. Unable to obtain SpO2 when attempted multiple times, DOE 1/4 only on 4LO2.   Stairs             Wheelchair Mobility    Modified Rankin (Stroke Patients Only)       Balance Overall balance assessment: Needs assistance Sitting-balance support: Feet supported;No upper extremity supported Sitting balance-Leahy Scale: Good     Standing balance support: Single extremity supported;During functional activity Standing balance-Leahy Scale: Poor Standing balance comment: reliant on external assist, especially dynamically                            Cognition Arousal/Alertness: Awake/alert Behavior During Therapy: WFL for tasks assessed/performed Overall Cognitive Status: Within Functional Limits for tasks assessed                                        Exercises      General Comments        Pertinent Vitals/Pain Pain Assessment: Faces Faces Pain Scale: No  hurt Pain Intervention(s): Limited activity within patient's tolerance;Monitored during session    Home Living                      Prior Function            PT Goals (current goals can now be found in the care plan section) Acute Rehab PT Goals Patient Stated Goal: return home PT Goal Formulation: With patient Time For Goal Achievement: 02/20/21 Potential to Achieve Goals: Good Progress towards PT goals: Progressing toward goals    Frequency    Min 3X/week      PT Plan Current  plan remains appropriate    Co-evaluation              AM-PAC PT "6 Clicks" Mobility   Outcome Measure  Help needed turning from your back to your side while in a flat bed without using bedrails?: None Help needed moving from lying on your back to sitting on the side of a flat bed without using bedrails?: None Help needed moving to and from a bed to a chair (including a wheelchair)?: None Help needed standing up from a chair using your arms (e.g., wheelchair or bedside chair)?: None Help needed to walk in hospital room?: A Little Help needed climbing 3-5 steps with a railing? : A Little 6 Click Score: 22    End of Session Equipment Utilized During Treatment: Oxygen Activity Tolerance: Patient tolerated treatment well Patient left: in chair;with call bell/phone within reach Nurse Communication: Mobility status PT Visit Diagnosis: Unsteadiness on feet (R26.81);Difficulty in walking, not elsewhere classified (R26.2)     Time: 1209-1229 PT Time Calculation (min) (ACUTE ONLY): 20 min  Charges:  $Therapeutic Activity: 8-22 mins                     Stacie Glaze, PT Acute Rehabilitation Services Pager [phone]  Office [phone]   Louis Matte 02/24/2021, 12:34 PM

## 2021-02-24 NOTE — Plan of Care (Signed)

## 2021-02-24 NOTE — Progress Notes (Signed)
Initial Nutrition Assessment  DOCUMENTATION CODES:   Severe malnutrition in context of chronic illness  INTERVENTION:   - Continue Glucerna Shake po BID, each supplement provides 220 kcal and 10 grams of protein  - Double protein portions TID with meals  - Continue MVI with minerals daily  - d/c ProSource Plus due to pt's refusal  NUTRITION DIAGNOSIS:   Severe Malnutrition related to chronic illness (COPD, CHF) as evidenced by moderate muscle depletion,severe muscle depletion,moderate fat depletion,energy intake < or equal to 50% for > or equal to 1 month.  Ongoing  GOAL:   Patient will meet greater than or equal to 90% of their needs  Progressing  MONITOR:   PO intake,Supplement acceptance,Skin,Labs,Weight trends,I & O's  REASON FOR ASSESSMENT:   Consult Assessment of nutrition requirement/status  ASSESSMENT:   52 YOM admitted for ARDS with hypoxia. PMH of CHF, HTN, abdominal aortic aneurysm w/o rupture, ETOH abuse, chronic idiopathic constipation, chronic pancreatitis, COPD on 4.5 L oxygen, PVD, T2DM, CAD s/p non-STEMI, recent MI s/p stent placement 12/2020.  Spoke with pt at bedside who reports good appetite and no issues eating. Pt consumed 100% of last 8 meals per RN documentation. Discussed pt with RN who confirms pt consumed 100% of breakfast.  Pt reports drinking the Glucerna Shakes because he knows that they are good for him. He does not really like them but will drink them. Pt states that he has not yet received a Glucerna this morning. RD provided pt with a chocolate Glucerna. RN aware.  Pt does not like ProSource and will not take it. RD will d/c orders. RD will order double protein portions TID with meals.  Admit weight: 78.6 kg Current weight: 75.4 kg  Meal Completion: 100%  Medications reviewed and include: ProSource Plus BID, jardiance, Glucerna Shake BID, lasix, SSI, novolog 70/30 20 units BID with meals, magnesium oxide, MVI with minerals,  protonix  Labs reviewed. CBG's: 159-275 x 24 hours  UOP: 2250 ml x 24 hours I/O's: -5.7 L since admit  Diet Order:   Diet Order            Diet heart healthy/carb modified Room service appropriate? Yes; Fluid consistency: Thin  Diet effective now                 EDUCATION NEEDS:   Education needs have been addressed (Low Sodium Diet Education)  Skin:  Skin Assessment: Skin Integrity Issues: Stage II: L ear  Last BM:  02/21/21  Height:   Ht Readings from Last 1 Encounters:  01/07/21 6\' 6"  (1.981 m)    Weight:   Wt Readings from Last 1 Encounters:  02/24/21 75.4 kg    Ideal Body Weight:  97.3 kg  BMI:  Body mass index is 19.21 kg/m.  Estimated Nutritional Needs:   Kcal:  2500-2700  Protein:  115-130 g  Fluid:  2 L    Gustavus Bryant, MS, RD, LDN Inpatient Clinical Dietitian Please see AMiON for contact information.

## 2021-02-24 NOTE — TOC Transition Note (Signed)
Transition of Care [facility]) - CM/SW Discharge Note   Patient Details  Name: Brandon [name] MRN: [id number] Date of Birth: 11-27-1946  [facility]) CM/SW Contact:  Zenon Mayo, RN Phone Number: 02/24/2021, 12:40 PM   Clinical Narrative:    Patient is for dc today, his daughter will transport him home per patient, he would like to continue [facility] services with [facility].   Final next level of care: [place] Barriers to Discharge: No Barriers Identified   Patient Goals and CMS Choice Patient states their goals for this hospitalization and ongoing recovery are:: get better CMS Medicare.gov Compare Post Acute Care list provided to:: Patient Choice offered to / list presented to : Patient  Discharge Placement                       Discharge Plan and Services In-house Referral: NA Discharge Planning Services: CM Consult Post Acute Care Choice: Home Health            DME Agency: NA       HH Arranged: RN,PT,OT [facility] Agency: [place] ([facility]) Date [place]: 02/24/21 Time [place]: [place] Representative spoke with at [place]: [place] ([place]) Interventions     Readmission Risk Interventions Readmission Risk Prevention Plan 02/24/2021 02/06/2021  Transportation Screening Complete Complete  PCP or Specialist Appt within 3-5 Days - Complete  HRI or [place] - Complete  Social Work Consult for [place] Planning/Counseling - Complete  Palliative Care Screening - Not Applicable  Medication Review Press photographer) Complete Complete  PCP or Specialist appointment within 3-5 days of discharge (No Data) -  [place] or Home Care Consult Complete -  SW Recovery Care/Counseling Consult Complete -  Palliative Care Screening Not Applicable -  [place] Not Applicable -  Some recent data might be hidden

## 2021-02-24 NOTE — TOC Initial Note (Signed)
Transition of Care [facility]) - Initial/Assessment Note    Patient Details  Name: Siaosi Alter MRN: [id number] Date of Birth: 09/08/1946  Transition of Care [facility]) CM/SW Contact:    Zenon Mayo, RN Phone Number: 02/24/2021, 12:37 PM  Clinical Narrative:                 Patient is for dc today, his daughter will transport him home per patient, he would like to continue [facility] services with [facility].   Expected Discharge Plan: [place] Barriers to Discharge: No Barriers Identified   Patient Goals and CMS Choice Patient states their goals for this hospitalization and ongoing recovery are:: get better CMS Medicare.gov Compare Post Acute Care list provided to:: Patient Choice offered to / list presented to : Patient  Expected Discharge Plan and Services Expected Discharge Plan: [place] In-house Referral: NA Discharge Planning Services: CM Consult Post Acute Care Choice: [place] arrangements for the past 2 months: Single Family Home Expected Discharge Date: 02/24/21                 DME Agency: NA       HH Arranged: RN,PT,OT [place] Agency: [place] ([facility]) Date HH Agency Contacted: 02/24/21 Time HH Agency Contacted: 55 Representative spoke with at [place]: [place] Arrangements/Services Living arrangements for the past 2 months: [place] with:: Self Patient language and need for interpreter reviewed:: Yes Do you feel safe going back to the place where you live?: Yes      Need for Family Participation in Patient Care: Yes (Comment) Care giver support system in place?: Yes (comment) Current home services: DME (has rolling walker and home oxygen with [employer]) Criminal Activity/Legal Involvement Pertinent to Current Situation/Hospitalization: No - Comment as needed  Activities of Daily Living      Permission Sought/Granted                  Emotional Assessment    Attitude/Demeanor/Rapport: Engaged Affect (typically observed): Appropriate Orientation: : Oriented to Self,Oriented to Place,Oriented to  Time,Oriented to Situation Alcohol / Substance Use: Not Applicable Psych Involvement: No (comment)  Admission diagnosis:  Hypoxia [R09.02] COPD exacerbation ([place]) [J44.1] Acute on chronic respiratory failure with hypoxia ([place]) [J96.21] Patient Active Problem List   Diagnosis Date Noted  . Protein-calorie malnutrition, severe 02/21/2021  . Acute on chronic respiratory failure with hypoxia ([place]) 02/19/2021  . Normocytic anemia 02/19/2021  . (HFpEF) heart failure with preserved ejection fraction ([place]) 02/01/2021  . Pressure injury of skin 01/09/2021  . Malnutrition of moderate degree 01/09/2021  . Elevated troponin 01/07/2021  . COPD with acute exacerbation ([place]) 01/07/2021  . History of CVA (cerebrovascular accident) 01/07/2021  . COPD exacerbation ([place]) 01/07/2021  . Hypomagnesemia 01/07/2021  . Essential hypertension   . Type 2 diabetes mellitus ([place])   . CAD (coronary artery disease)   . GERD (gastroesophageal reflux disease)   . Acute on chronic respiratory failure with hypoxemia ([place])   . COPD (chronic obstructive pulmonary disease) ([employer])   . Tobacco abuse   . Hyponatremia   . Hyperkalemia   . Ischemic stroke [facility])    PCP:  Pcp, No Pharmacy:   [place], [place] - [place] [address] [place] [address] Phone: [phone] Fax: [phone]  [facility] DRUG STORE [place], [place]  [place] [address] [place] [address] Phone: [phone] Fax: [phone]  [facility] of [place], [place] [address] [place] [address] Phone: [phone] Fax: [phone]     Social Determinants of Health (SDOH) Interventions     Readmission Risk Interventions Readmission Risk Prevention Plan 02/24/2021 02/06/2021  Transportation Screening Complete Complete  PCP or Specialist Appt within 3-5 Days - Complete  HRI or [place] - Complete  Social Work Consult for [place] Planning/Counseling - Complete  Palliative Care Screening - Not Applicable  Medication Review Press photographer) Complete Complete  PCP or Specialist appointment within 3-5 days of discharge (No Data) -  [place] or Home Care Consult Complete -  SW Recovery Care/Counseling Consult Complete -  Palliative Care Screening Not Applicable -  [place] Not Applicable -  Some recent data might be hidden

## 2021-02-24 NOTE — Discharge Instructions (Signed)

## 2021-03-13 ENCOUNTER — Emergency Department (HOSPITAL_COMMUNITY): Payer: No Typology Code available for payment source

## 2021-03-13 ENCOUNTER — Other Ambulatory Visit: Payer: Self-pay

## 2021-03-13 ENCOUNTER — Inpatient Hospital Stay (HOSPITAL_COMMUNITY)
Admission: EM | Admit: 2021-03-13 | Discharge: 2021-03-18 | DRG: 291 | Disposition: A | Discharge status: Skilled Nursing Facility | Payer: No Typology Code available for payment source | Attending: Internal Medicine | Admitting: Internal Medicine

## 2021-03-13 ENCOUNTER — Encounter (HOSPITAL_COMMUNITY): Payer: Self-pay | Admitting: *Deleted

## 2021-03-13 DIAGNOSIS — T380X5A Adverse effect of glucocorticoids and synthetic analogues, initial encounter: Secondary | ICD-10-CM | POA: Diagnosis present

## 2021-03-13 DIAGNOSIS — J449 Chronic obstructive pulmonary disease, unspecified: Secondary | ICD-10-CM | POA: Diagnosis present

## 2021-03-13 DIAGNOSIS — Z955 Presence of coronary angioplasty implant and graft: Secondary | ICD-10-CM

## 2021-03-13 DIAGNOSIS — K219 Gastro-esophageal reflux disease without esophagitis: Secondary | ICD-10-CM | POA: Diagnosis present

## 2021-03-13 DIAGNOSIS — I712 Thoracic aortic aneurysm, without rupture: Secondary | ICD-10-CM | POA: Diagnosis present

## 2021-03-13 DIAGNOSIS — J441 Chronic obstructive pulmonary disease with (acute) exacerbation: Secondary | ICD-10-CM | POA: Diagnosis present

## 2021-03-13 DIAGNOSIS — E871 Hypo-osmolality and hyponatremia: Secondary | ICD-10-CM | POA: Diagnosis not present

## 2021-03-13 DIAGNOSIS — Z7982 Long term (current) use of aspirin: Secondary | ICD-10-CM

## 2021-03-13 DIAGNOSIS — I480 Paroxysmal atrial fibrillation: Secondary | ICD-10-CM | POA: Diagnosis present

## 2021-03-13 DIAGNOSIS — K861 Other chronic pancreatitis: Secondary | ICD-10-CM | POA: Diagnosis present

## 2021-03-13 DIAGNOSIS — I471 Supraventricular tachycardia: Secondary | ICD-10-CM | POA: Diagnosis present

## 2021-03-13 DIAGNOSIS — J9621 Acute and chronic respiratory failure with hypoxia: Secondary | ICD-10-CM | POA: Diagnosis present

## 2021-03-13 DIAGNOSIS — Z87891 Personal history of nicotine dependence: Secondary | ICD-10-CM | POA: Diagnosis not present

## 2021-03-13 DIAGNOSIS — I5043 Acute on chronic combined systolic (congestive) and diastolic (congestive) heart failure: Secondary | ICD-10-CM

## 2021-03-13 DIAGNOSIS — Z7189 Other specified counseling: Secondary | ICD-10-CM

## 2021-03-13 DIAGNOSIS — Z8249 Family history of ischemic heart disease and other diseases of the circulatory system: Secondary | ICD-10-CM | POA: Diagnosis not present

## 2021-03-13 DIAGNOSIS — R9431 Abnormal electrocardiogram [ECG] [EKG]: Secondary | ICD-10-CM | POA: Diagnosis not present

## 2021-03-13 DIAGNOSIS — Z7902 Long term (current) use of antithrombotics/antiplatelets: Secondary | ICD-10-CM

## 2021-03-13 DIAGNOSIS — D638 Anemia in other chronic diseases classified elsewhere: Secondary | ICD-10-CM | POA: Diagnosis present

## 2021-03-13 DIAGNOSIS — Z888 Allergy status to other drugs, medicaments and biological substances status: Secondary | ICD-10-CM

## 2021-03-13 DIAGNOSIS — Z9981 Dependence on supplemental oxygen: Secondary | ICD-10-CM

## 2021-03-13 DIAGNOSIS — E1165 Type 2 diabetes mellitus with hyperglycemia: Secondary | ICD-10-CM | POA: Diagnosis present

## 2021-03-13 DIAGNOSIS — Z79899 Other long term (current) drug therapy: Secondary | ICD-10-CM

## 2021-03-13 DIAGNOSIS — I110 Hypertensive heart disease with heart failure: Secondary | ICD-10-CM | POA: Diagnosis present

## 2021-03-13 DIAGNOSIS — Z66 Do not resuscitate: Secondary | ICD-10-CM | POA: Diagnosis not present

## 2021-03-13 DIAGNOSIS — R778 Other specified abnormalities of plasma proteins: Secondary | ICD-10-CM

## 2021-03-13 DIAGNOSIS — R7989 Other specified abnormal findings of blood chemistry: Secondary | ICD-10-CM | POA: Diagnosis present

## 2021-03-13 DIAGNOSIS — I2583 Coronary atherosclerosis due to lipid rich plaque: Secondary | ICD-10-CM

## 2021-03-13 DIAGNOSIS — I5033 Acute on chronic diastolic (congestive) heart failure: Secondary | ICD-10-CM | POA: Diagnosis present

## 2021-03-13 DIAGNOSIS — I252 Old myocardial infarction: Secondary | ICD-10-CM

## 2021-03-13 DIAGNOSIS — Z794 Long term (current) use of insulin: Secondary | ICD-10-CM | POA: Diagnosis not present

## 2021-03-13 DIAGNOSIS — E119 Type 2 diabetes mellitus without complications: Secondary | ICD-10-CM

## 2021-03-13 DIAGNOSIS — I2510 Atherosclerotic heart disease of native coronary artery without angina pectoris: Secondary | ICD-10-CM | POA: Diagnosis not present

## 2021-03-13 DIAGNOSIS — N179 Acute kidney failure, unspecified: Secondary | ICD-10-CM | POA: Diagnosis present

## 2021-03-13 DIAGNOSIS — I509 Heart failure, unspecified: Secondary | ICD-10-CM | POA: Diagnosis not present

## 2021-03-13 DIAGNOSIS — E785 Hyperlipidemia, unspecified: Secondary | ICD-10-CM | POA: Diagnosis present

## 2021-03-13 DIAGNOSIS — I10 Essential (primary) hypertension: Secondary | ICD-10-CM | POA: Diagnosis present

## 2021-03-13 DIAGNOSIS — E1159 Type 2 diabetes mellitus with other circulatory complications: Secondary | ICD-10-CM

## 2021-03-13 DIAGNOSIS — L89812 Pressure ulcer of head, stage 2: Secondary | ICD-10-CM | POA: Diagnosis present

## 2021-03-13 DIAGNOSIS — Z20822 Contact with and (suspected) exposure to covid-19: Secondary | ICD-10-CM | POA: Diagnosis present

## 2021-03-13 DIAGNOSIS — Z515 Encounter for palliative care: Secondary | ICD-10-CM

## 2021-03-13 DIAGNOSIS — E1151 Type 2 diabetes mellitus with diabetic peripheral angiopathy without gangrene: Secondary | ICD-10-CM | POA: Diagnosis present

## 2021-03-13 DIAGNOSIS — Z9114 Patient's other noncompliance with medication regimen: Secondary | ICD-10-CM

## 2021-03-13 DIAGNOSIS — I272 Pulmonary hypertension, unspecified: Secondary | ICD-10-CM | POA: Diagnosis present

## 2021-03-13 DIAGNOSIS — I248 Other forms of acute ischemic heart disease: Secondary | ICD-10-CM | POA: Diagnosis present

## 2021-03-13 DIAGNOSIS — Z8673 Personal history of transient ischemic attack (TIA), and cerebral infarction without residual deficits: Secondary | ICD-10-CM

## 2021-03-13 DIAGNOSIS — Z8546 Personal history of malignant neoplasm of prostate: Secondary | ICD-10-CM

## 2021-03-13 LAB — CBC WITH DIFFERENTIAL/PLATELET
Abs Immature Granulocytes: 0.02 10*3/uL (ref 0.00–0.07)
Basophils Absolute: 0 10*3/uL (ref 0.0–0.1)
Basophils Relative: 0 %
Eosinophils Absolute: 0 10*3/uL (ref 0.0–0.5)
Eosinophils Relative: 1 %
HCT: 37.3 % — ABNORMAL LOW (ref 39.0–52.0)
Hemoglobin: 11.2 g/dL — ABNORMAL LOW (ref 13.0–17.0)
Immature Granulocytes: 0 %
Lymphocytes Relative: 20 %
Lymphs Abs: 1.4 10*3/uL (ref 0.7–4.0)
MCH: 26.9 pg (ref 26.0–34.0)
MCHC: 30 g/dL (ref 30.0–36.0)
MCV: 89.7 fL (ref 80.0–100.0)
Monocytes Absolute: 0.5 10*3/uL (ref 0.1–1.0)
Monocytes Relative: 7 %
Neutro Abs: 5 10*3/uL (ref 1.7–7.7)
Neutrophils Relative %: 72 %
Platelets: 315 10*3/uL (ref 150–400)
RBC: 4.16 MIL/uL — ABNORMAL LOW (ref 4.22–5.81)
RDW: 26.5 % — ABNORMAL HIGH (ref 11.5–15.5)
WBC: 6.9 10*3/uL (ref 4.0–10.5)
nRBC: 0 % (ref 0.0–0.2)

## 2021-03-13 LAB — COMPREHENSIVE METABOLIC PANEL
ALT: 14 U/L (ref 0–44)
AST: 19 U/L (ref 15–41)
Albumin: 3.1 g/dL — ABNORMAL LOW (ref 3.5–5.0)
Alkaline Phosphatase: 59 U/L (ref 38–126)
Anion gap: 11 (ref 5–15)
BUN: 19 mg/dL (ref 8–23)
CO2: 25 mmol/L (ref 22–32)
Calcium: 8.9 mg/dL (ref 8.9–10.3)
Chloride: 103 mmol/L (ref 98–111)
Creatinine, Ser: 1.46 mg/dL — ABNORMAL HIGH (ref 0.61–1.24)
GFR, Estimated: 50 mL/min — ABNORMAL LOW (ref >60)
Glucose, Bld: 86 mg/dL (ref 70–99)
Potassium: 3.6 mmol/L (ref 3.5–5.1)
Sodium: 139 mmol/L (ref 135–145)
Total Bilirubin: 1.1 mg/dL (ref 0.3–1.2)
Total Protein: 5.8 g/dL — ABNORMAL LOW (ref 6.5–8.1)

## 2021-03-13 LAB — TROPONIN I (HIGH SENSITIVITY)
Troponin I (High Sensitivity): 65 ng/L — ABNORMAL HIGH (ref <18)
Troponin I (High Sensitivity): 59 ng/L — ABNORMAL HIGH (ref <18)

## 2021-03-13 LAB — BRAIN NATRIURETIC PEPTIDE: B Natriuretic Peptide: 110.5 pg/mL — ABNORMAL HIGH (ref 0.0–100.0)

## 2021-03-13 LAB — GLUCOSE, CAPILLARY
Glucose-Capillary: 115 mg/dL — ABNORMAL HIGH (ref 70–99)
Glucose-Capillary: 245 mg/dL — ABNORMAL HIGH (ref 70–99)

## 2021-03-13 LAB — RESP PANEL BY RT-PCR (FLU A&B, COVID) ARPGX2
Influenza A by PCR: NEGATIVE
Influenza B by PCR: NEGATIVE
SARS Coronavirus 2 by RT PCR: NEGATIVE

## 2021-03-13 MED ORDER — FUROSEMIDE 10 MG/ML IJ SOLN
40.0000 mg | Freq: Two times a day (BID) | INTRAMUSCULAR | Status: DC
  Administered 2021-03-13 – 2021-03-17 (×8): 40 mg via INTRAVENOUS
  Filled 2021-03-13 (×8): qty 4

## 2021-03-13 MED ORDER — TIOTROPIUM BROMIDE MONOHYDRATE 2.5 MCG/ACT IN AERS: 2.0000 | INHALATION_SPRAY | Freq: Every day | RESPIRATORY_TRACT | Status: DC

## 2021-03-13 MED ORDER — ALBUTEROL SULFATE (2.5 MG/3ML) 0.083% IN NEBU: 2.5000 mg | INHALATION_SOLUTION | Freq: Four times a day (QID) | RESPIRATORY_TRACT | Status: DC | PRN

## 2021-03-13 MED ORDER — SODIUM CHLORIDE 0.9 % IV SOLN: 250.0000 mL | INTRAVENOUS | Status: DC | PRN

## 2021-03-13 MED ORDER — SODIUM CHLORIDE 0.9% FLUSH
3.0000 mL | Freq: Two times a day (BID) | INTRAVENOUS | Status: DC
  Administered 2021-03-13 – 2021-03-18 (×11): 3 mL via INTRAVENOUS

## 2021-03-13 MED ORDER — SENNOSIDES-DOCUSATE SODIUM 8.6-50 MG PO TABS
1.0000 | ORAL_TABLET | Freq: Every day | ORAL | Status: DC
  Administered 2021-03-14 – 2021-03-18 (×5): 1 via ORAL
  Filled 2021-03-13 (×6): qty 1

## 2021-03-13 MED ORDER — DILTIAZEM HCL ER COATED BEADS 240 MG PO CP24
240.0000 mg | ORAL_CAPSULE | Freq: Every day | ORAL | Status: DC
  Administered 2021-03-14 – 2021-03-18 (×5): 240 mg via ORAL
  Filled 2021-03-13 (×5): qty 1

## 2021-03-13 MED ORDER — ASPIRIN EC 81 MG PO TBEC
81.0000 mg | DELAYED_RELEASE_TABLET | Freq: Every day | ORAL | Status: DC
  Administered 2021-03-14 – 2021-03-18 (×5): 81 mg via ORAL
  Filled 2021-03-13 (×5): qty 1

## 2021-03-13 MED ORDER — MOMETASONE FURO-FORMOTEROL FUM 200-5 MCG/ACT IN AERO
2.0000 | INHALATION_SPRAY | Freq: Two times a day (BID) | RESPIRATORY_TRACT | Status: DC
  Administered 2021-03-14 – 2021-03-18 (×9): 2 via RESPIRATORY_TRACT
  Filled 2021-03-13: qty 8.8

## 2021-03-13 MED ORDER — INSULIN ASPART PROT & ASPART (70-30 MIX) 100 UNIT/ML PEN: 10.0000 [IU] | PEN_INJECTOR | Freq: Two times a day (BID) | SUBCUTANEOUS | Status: DC

## 2021-03-13 MED ORDER — ONDANSETRON HCL 4 MG/2ML IJ SOLN: 4.0000 mg | Freq: Four times a day (QID) | INTRAMUSCULAR | Status: DC | PRN

## 2021-03-13 MED ORDER — INSULIN ASPART 100 UNIT/ML ~~LOC~~ SOLN
0.0000 [IU] | Freq: Three times a day (TID) | SUBCUTANEOUS | Status: DC
  Administered 2021-03-14: 5 [IU] via SUBCUTANEOUS
  Administered 2021-03-14: 2 [IU] via SUBCUTANEOUS
  Administered 2021-03-14: 7 [IU] via SUBCUTANEOUS
  Administered 2021-03-15: 9 [IU] via SUBCUTANEOUS
  Administered 2021-03-15: 5 [IU] via SUBCUTANEOUS
  Administered 2021-03-15: 3 [IU] via SUBCUTANEOUS
  Administered 2021-03-16 (×2): 7 [IU] via SUBCUTANEOUS
  Administered 2021-03-16: 5 [IU] via SUBCUTANEOUS
  Administered 2021-03-17 (×2): 3 [IU] via SUBCUTANEOUS
  Administered 2021-03-17: 5 [IU] via SUBCUTANEOUS
  Administered 2021-03-18: 3 [IU] via SUBCUTANEOUS
  Administered 2021-03-18: 9 [IU] via SUBCUTANEOUS
  Administered 2021-03-18: 1 [IU] via SUBCUTANEOUS

## 2021-03-13 MED ORDER — POTASSIUM CHLORIDE CRYS ER 20 MEQ PO TBCR
20.0000 meq | EXTENDED_RELEASE_TABLET | Freq: Once | ORAL | Status: AC
  Administered 2021-03-13: 20 meq via ORAL
  Filled 2021-03-13: qty 1

## 2021-03-13 MED ORDER — ATORVASTATIN CALCIUM 40 MG PO TABS
40.0000 mg | ORAL_TABLET | Freq: Every day | ORAL | Status: DC
  Administered 2021-03-13 – 2021-03-17 (×5): 40 mg via ORAL
  Filled 2021-03-13 (×5): qty 1

## 2021-03-13 MED ORDER — ENOXAPARIN SODIUM 40 MG/0.4ML ~~LOC~~ SOLN
40.0000 mg | SUBCUTANEOUS | Status: DC
  Administered 2021-03-13 – 2021-03-18 (×6): 40 mg via SUBCUTANEOUS
  Filled 2021-03-13 (×6): qty 0.4

## 2021-03-13 MED ORDER — CLOPIDOGREL BISULFATE 75 MG PO TABS
75.0000 mg | ORAL_TABLET | Freq: Every day | ORAL | Status: DC
  Administered 2021-03-14 – 2021-03-18 (×5): 75 mg via ORAL
  Filled 2021-03-13 (×5): qty 1

## 2021-03-13 MED ORDER — INSULIN ASPART PROT & ASPART (70-30 MIX) 100 UNIT/ML PEN: 20.0000 [IU] | PEN_INJECTOR | Freq: Two times a day (BID) | SUBCUTANEOUS | Status: DC

## 2021-03-13 MED ORDER — INSULIN ASPART PROT & ASPART (70-30 MIX) 100 UNIT/ML ~~LOC~~ SUSP
10.0000 [IU] | Freq: Two times a day (BID) | SUBCUTANEOUS | Status: DC
  Administered 2021-03-14: 10 [IU] via SUBCUTANEOUS
  Filled 2021-03-13: qty 10

## 2021-03-13 MED ORDER — MAGNESIUM OXIDE 400 (241.3 MG) MG PO TABS
400.0000 mg | ORAL_TABLET | Freq: Every day | ORAL | Status: DC
  Administered 2021-03-14 – 2021-03-18 (×5): 400 mg via ORAL
  Filled 2021-03-13 (×5): qty 1

## 2021-03-13 MED ORDER — BISOPROLOL FUMARATE 5 MG PO TABS
5.0000 mg | ORAL_TABLET | Freq: Every day | ORAL | Status: DC
  Administered 2021-03-14 – 2021-03-18 (×5): 5 mg via ORAL
  Filled 2021-03-13 (×5): qty 1

## 2021-03-13 MED ORDER — POTASSIUM CHLORIDE CRYS ER 10 MEQ PO TBCR
10.0000 meq | EXTENDED_RELEASE_TABLET | Freq: Every day | ORAL | Status: DC
  Administered 2021-03-14 – 2021-03-15 (×2): 10 meq via ORAL
  Filled 2021-03-13 (×3): qty 1

## 2021-03-13 MED ORDER — FUROSEMIDE 10 MG/ML IJ SOLN
80.0000 mg | Freq: Once | INTRAMUSCULAR | Status: AC
  Administered 2021-03-13: 80 mg via INTRAVENOUS
  Filled 2021-03-13: qty 8

## 2021-03-13 MED ORDER — NITROGLYCERIN IN D5W 200-5 MCG/ML-% IV SOLN
0.0000 ug/min | INTRAVENOUS | Status: DC
  Administered 2021-03-13: 5 ug/min via INTRAVENOUS
  Filled 2021-03-13: qty 250

## 2021-03-13 MED ORDER — GLUCERNA SHAKE PO LIQD
237.0000 mL | Freq: Two times a day (BID) | ORAL | Status: DC
  Administered 2021-03-15 – 2021-03-18 (×7): 237 mL via ORAL

## 2021-03-13 MED ORDER — ACETAMINOPHEN 325 MG PO TABS
650.0000 mg | ORAL_TABLET | ORAL | Status: DC | PRN
  Administered 2021-03-14: 650 mg via ORAL
  Filled 2021-03-13: qty 2

## 2021-03-13 MED ORDER — UMECLIDINIUM BROMIDE 62.5 MCG/INH IN AEPB
1.0000 | INHALATION_SPRAY | Freq: Every day | RESPIRATORY_TRACT | Status: DC
  Administered 2021-03-14 – 2021-03-18 (×5): 1 via RESPIRATORY_TRACT
  Filled 2021-03-13: qty 7

## 2021-03-13 MED ORDER — SODIUM CHLORIDE 0.9% FLUSH
3.0000 mL | INTRAVENOUS | Status: DC | PRN
Start: 2021-03-13 — End: 2021-03-19

## 2021-03-13 NOTE — ED Provider Notes (Signed)
[facility] EMERGENCY DEPARTMENT Provider Note   CSN: [id number] Arrival date & time: 03/13/21  1212     History Chief Complaint  Patient presents with  . Shortness of Breath    Brandon [name] is a 75 y.o. male.  HPI 75 year old male with an extensive medical history including CAD, chronic alcoholism, chronic pancreatitis, CHF with an EF of 60 to 65% as of February 2022, paroxysmal atrial tachycardia, COPD presents to the ER with complaints of shortness of breath, 10 pound weight gain in the last week, abdominal swelling.  Patient was found to be reportedly hypoxic with EMS in the 17s and 70s.  He was placed on a nonrebreather, given 100 mg of Solu-Medrol from EMS.  He denies any chest pain.  He denies any cough, fevers, chills, no infectious symptoms.  He is not on any anticoagulation.   Patient reports that he is followed by the [place], but was recently hospitalized on 2/22 with COPD exacerbation with hypoxia.  Patient was seen by Dr. Stanford Breed with cardiology in the hospital. Past Medical History:  Diagnosis Date  . Abdominal aortic aneurysm without rupture ([place])   . CAD (coronary artery disease)    severe on chest CT   . Chronic alcoholism ([place])   . Chronic idiopathic constipation   . Chronic pancreatitis ([place])   . Chronic respiratory failure with hypoxia ([place])   . Essential hypertension   . GERD (gastroesophageal reflux disease)   . Hyperaldosteronism ([place])   . Malignant neoplasm of prostate ([place])   . Nicotine dependence   . Paroxysmal atrial tachycardia ([place])   . Peripheral vascular disease ([place])   . Serrated polyp of colon   . Type 2 diabetes mellitus [facility])     Patient Active Problem List   Diagnosis Date Noted  . Protein-calorie malnutrition, severe 02/21/2021  . Acute on chronic respiratory failure with hypoxia ([place]) 02/19/2021  . Normocytic anemia 02/19/2021  . (HFpEF) heart failure with preserved ejection fraction ([place]) 02/01/2021  . Pressure  injury of skin 01/09/2021  . Malnutrition of moderate degree 01/09/2021  . Elevated troponin 01/07/2021  . COPD with acute exacerbation ([place]) 01/07/2021  . History of CVA (cerebrovascular accident) 01/07/2021  . COPD exacerbation ([place]) 01/07/2021  . Hypomagnesemia 01/07/2021  . Essential hypertension   . Type 2 diabetes mellitus ([place])   . CAD (coronary artery disease)   . GERD (gastroesophageal reflux disease)   . Acute on chronic respiratory failure with hypoxemia ([place])   . COPD (chronic obstructive pulmonary disease) ([place])   . Tobacco abuse   . Hyponatremia   . Hyperkalemia   . Ischemic stroke [facility])     Past Surgical History:  Procedure Laterality Date  . CORONARY ATHERECTOMY N/A 01/11/2021   Procedure: CORONARY ATHERECTOMY;  Surgeon: Sherren Mocha, MD;  Location: [facility] CV LAB;  Service: Cardiovascular;  Laterality: N/A;  . CORONARY ATHERECTOMY N/A 01/13/2021   Procedure: CORONARY ATHERECTOMY;  Surgeon: Martinique, Peter M, MD;  Location: [facility] CV LAB;  Service: Cardiovascular;  Laterality: N/A;  . CORONARY STENT INTERVENTION N/A 01/13/2021   Procedure: CORONARY STENT INTERVENTION;  Surgeon: Martinique, Peter M, MD;  Location: [facility] CV LAB;  Service: Cardiovascular;  Laterality: N/A;  . INTRAVASCULAR ULTRASOUND/IVUS N/A 01/13/2021   Procedure: Intravascular Ultrasound/IVUS;  Surgeon: Martinique, Peter M, MD;  Location: [facility] CV LAB;  Service: Cardiovascular;  Laterality: N/A;  . PROSTATECTOMY  12/2012  . RIGHT/LEFT HEART CATH AND CORONARY ANGIOGRAPHY N/A 01/11/2021   Procedure: RIGHT/LEFT  HEART CATH AND CORONARY ANGIOGRAPHY;  Surgeon: Sherren Mocha, MD;  Location: [facility];  Service: Cardiovascular;  Laterality: N/A;       Family History  Problem Relation Age of Onset  . Hypertension Mother   . Hypertension Father     Social History   Tobacco Use  . Smoking status: Former Smoker    Packs/day: 0.50    Years: 60.00    Pack years: 30.00    Types:  Cigarettes    Quit date: 10/08/2020    Years since quitting: 0.4  . Smokeless tobacco: Never Used  Substance Use Topics  . Alcohol use: Not Currently  . Drug use: Never    Home Medications Prior to Admission medications   Medication Sig Start Date End Date Taking? Authorizing Provider  acetaminophen (TYLENOL) 325 MG tablet Take 2 tablets (650 mg total) by mouth every 6 (six) hours as needed for mild pain (or Fever >/= 101). 01/14/21   Raiford Noble Latif, DO  albuterol (VENTOLIN HFA) 108 (90 Base) MCG/ACT inhaler Inhale 2 puffs into the lungs every 4 (four) hours as needed for wheezing or shortness of breath. 11/24/19   [provider]  aspirin 81 MG EC tablet Take 1 tablet (81 mg total) by mouth daily. 10/21/20   Little Ishikawa, MD  atorvastatin (LIPITOR) 40 MG tablet Take 40 mg by mouth at bedtime.  06/11/19   [provider]  bisoprolol (ZEBETA) 5 MG tablet Take 1 tablet (5 mg total) by mouth daily. 01/14/21   Raiford Noble Latif, DO  clopidogrel (PLAVIX) 75 MG tablet Take 1 tablet (75 mg total) by mouth daily. 10/22/20   Little Ishikawa, MD  diltiazem (CARDIZEM CD) 240 MG 24 hr capsule Take 1 capsule (240 mg total) by mouth daily. 01/14/21   Raiford Noble Latif, DO  empagliflozin (JARDIANCE) 10 MG TABS tablet Take 1 tablet (10 mg total) by mouth daily. 02/24/21   Geradine Girt, DO  empagliflozin (JARDIANCE) 10 MG TABS tablet Take 1 tablet (10 mg total) by mouth daily before breakfast. 03/10/21   Geradine Girt, DO  feeding supplement, GLUCERNA SHAKE, (GLUCERNA SHAKE) LIQD Take 237 mLs by mouth 2 (two) times daily between meals. Patient taking differently: Take 237 mLs by mouth daily. 01/14/21   Raiford Noble Latif, DO  Fluticasone-Salmeterol (ADVAIR) 250-50 MCG/DOSE AEPB Inhale 1 puff into the lungs 2 (two) times daily.    [provider]  furosemide (LASIX) 40 MG tablet Take 1 tablet (40 mg total) by mouth 2 (two) times daily. 02/24/21   Geradine Girt,  DO  insulin aspart protamine - aspart (NOVOLOG 70/30 MIX) (70-30) 100 UNIT/ML FlexPen Inject 0.2 mLs (20 Units total) into the skin 2 (two) times daily with a meal. 01/14/21   Sheikh, Georgina Quint Latif, DO  magnesium oxide (MAG-OX) 400 (241.3 Mg) MG tablet Take 1 tablet (400 mg total) by mouth daily. Patient not taking: No sig reported 01/14/21   Raiford Noble Latif, DO  metFORMIN (GLUCOPHAGE) 1000 MG tablet Take 1,000 mg by mouth 2 (two) times daily with a meal.     [provider]  Multiple Vitamin (MULTIVITAMIN WITH MINERALS) TABS tablet Take 1 tablet by mouth daily. Patient not taking: No sig reported 01/14/21   Raiford Noble Latif, DO  nortriptyline (PAMELOR) 50 MG capsule Take 50 mg by mouth at bedtime.    [provider]  Omega-3 1000 MG CAPS Take 1,000 mg by mouth in the morning and at bedtime.  [provider]  oxybutynin (DITROPAN-XL) 10 MG 24 hr tablet Take 10 mg by mouth daily.    [provider]  OXYGEN Inhale 4.5 L/min into the lungs continuous.    [provider]  pantoprazole (PROTONIX) 40 MG tablet Take 1 tablet (40 mg total) by mouth daily. Patient taking differently: Take 40 mg by mouth daily before breakfast. 01/14/21   Raiford Noble Latif, DO  potassium chloride (KLOR-CON) 10 MEQ tablet Take 2 tablets (20 mEq total) by mouth daily. 02/24/21   Geradine Girt, DO  senna-docusate (SENOKOT-S) 8.6-50 MG tablet Take 1 tablet by mouth at bedtime as needed for mild constipation. Patient taking differently: Take 1 tablet by mouth daily at 6 (six) AM. 01/14/21   Raiford Noble Latif, DO  Tiotropium Bromide Monohydrate 2.5 MCG/ACT AERS Inhale 2 puffs into the lungs daily.    [provider]    Allergies    Furosemide and Hydrochlorothiazide  Review of Systems   Review of Systems  Constitutional: Negative for chills and fever.  HENT: Negative for ear pain and sore throat.   Eyes: Negative for pain and visual disturbance.  Respiratory:  Positive for shortness of breath. Negative for cough.   Cardiovascular: Negative for chest pain and palpitations.  Gastrointestinal: Negative for abdominal pain and vomiting.  Genitourinary: Negative for dysuria and hematuria.  Musculoskeletal: Negative for arthralgias and back pain.  Skin: Negative for color change and rash.  Neurological: Positive for weakness. Negative for seizures and syncope.  All other systems reviewed and are negative.   Physical Exam Updated Vital Signs BP 114/68   Pulse 88   Temp 97.6 F (36.4 C) (Oral)   Resp 18   Ht 6' (1.829 m) Comment: previous entry of 6'6" seems to have been in error  Wt 78.1 kg   SpO2 95%   BMI 23.35 kg/m   Physical Exam Vitals and nursing note reviewed.  Constitutional:      Appearance: He is well-developed.  HENT:     Head: Normocephalic and atraumatic.  Eyes:     Conjunctiva/sclera: Conjunctivae normal.  Cardiovascular:     Rate and Rhythm: Normal rate and regular rhythm.     Heart sounds: No murmur heard.   Pulmonary:     Effort: Pulmonary effort is normal. Tachypnea present. No respiratory distress.     Breath sounds: Decreased breath sounds present. No wheezing, rhonchi or rales.  Chest:     Chest wall: No tenderness.  Abdominal:     Palpations: Abdomen is soft.     Tenderness: There is no abdominal tenderness.     Comments: Distended, mildly taut abdomen  Musculoskeletal:     Cervical back: Neck supple.     Right lower leg: No tenderness. Edema present.     Left lower leg: No tenderness. Edema present.     Comments: Extremely mottled, erythematous lower extremity with 1+ nonpitting edema  Skin:    General: Skin is warm and dry.     Findings: Erythema present.  Neurological:     General: No focal deficit present.     Mental Status: He is alert.  Psychiatric:        Mood and Affect: Mood normal.        Behavior: Behavior normal.     ED Results / Procedures / Treatments   Labs (all labs ordered are  listed, but only abnormal results are displayed) Labs Reviewed  CBC WITH DIFFERENTIAL/PLATELET - Abnormal; Notable for the following components:  Result Value   RBC 4.16 (*)    Hemoglobin 11.2 (*)    HCT 37.3 (*)    RDW 26.5 (*)    All other components within normal limits  COMPREHENSIVE METABOLIC PANEL - Abnormal; Notable for the following components:   Creatinine, Ser 1.46 (*)    Total Protein 5.8 (*)    Albumin 3.1 (*)    GFR, Estimated 50 (*)    All other components within normal limits  BRAIN NATRIURETIC PEPTIDE - Abnormal; Notable for the following components:   B Natriuretic Peptide 110.5 (*)    All other components within normal limits  TROPONIN I (HIGH SENSITIVITY) - Abnormal; Notable for the following components:   Troponin I (High Sensitivity) 65 (*)    All other components within normal limits  RESP PANEL BY RT-PCR (FLU A&B, COVID) [id number]  TROPONIN I (HIGH SENSITIVITY)    EKG EKG Interpretation  Date/Time:  Monday March 13 2021 12:27:02 EDT Ventricular Rate:  104 PR Interval:    QRS Duration: 102 QT Interval:  388 QTC Calculation: 511 R Axis:   70 Text Interpretation: Sinus tachycardia with irregular rate Borderline T wave abnormalities Prolonged QT interval Confirmed by Pattricia Boss [phone]) on 03/13/2021 12:55:51 PM   Radiology DG Chest Portable 1 View  Result Date: 03/13/2021 CLINICAL DATA:  Shortness of breath. EXAM: PORTABLE CHEST 1 VIEW COMPARISON:  02/21/2021 and CT chest 1022. FINDINGS: Patient is slightly rotated. Trachea is midline. Heart is enlarged. Thoracic aorta is calcified. Mid and lower lung zone predominant mixed interstitial and airspace opacification with small bilateral pleural effusions. IMPRESSION: Congestive heart failure. Electronically Signed   By: Lorin Picket M.D.   On: 03/13/2021 12:42    Procedures Procedures   Medications Ordered in ED Medications  furosemide (LASIX) injection 80 mg (80 mg Intravenous Given 03/13/21  1310)    ED Course  I have reviewed the triage vital signs and the nursing notes.  Pertinent labs & imaging results that were available during my care of the patient were reviewed by me and considered in my medical decision making (see chart for details).  Clinical Course as of 03/13/21 1451  Mon Mar 14, 7047  2035 75 year old male who presents with shortness of breath and hypoxia via EMS.  On arrival, he is chronically ill-appearing, mildly tachypneic, on a nonrebreather with sats at 100%.  Blood pressure 117/62 on arrival.  Mildly tachycardic with a rate of 103 however this improved throughout the ED course.  On exam, he has diminished lung sounds throughout.  He has a visibly distended abdomen, mottled, erythematous extremities consistent with chronic venous stasis.  He has 1+ edema to his lower extremities.  DDx includes COPD exacerbation, for heart failure exacerbation, COVID-19, pneumonia, PE  Labs ordered, reviewed and interpreted by me.  CBC without leukocytosis, hemoglobin 11.2 which is stable.  CMP without any significant abnormalities, he does have a mild AKI with a creatinine 1.46, appears to have a normal renal function at baseline.  GFR 50.  Covid test is negative.  Troponin of 65 suspect this is elevated with CHF exacerbation.  Imaging ordered, reviewed and interpreted by me.  Chest x-ray consistent with CHF.  EKG sinus tachycardia with prolonged QT reviewed by myself or my supervising physician  Attempted to wean the patient off the nonrebreather, placed on nasal cannula.  Patient hypoxic with sats at 88%.  He was placed on a BiPAP, given 80 mg of IV Lasix, and initiated nitroglycerin titration IV as per  discussion with supervising physician Dr. Jeanell Sparrow.   [MB]  1428 B Natriuretic Peptide(!): 110.5 [MB]  1428 Stable on BiPAP, reports he is feeling better.  Blood pressures stable.  Consulted hospitalist team for admission. [MB]  [place] hospitalist Dr. Tamala Julian who will admit  the patient for further evaluation and treatment. [MB]  3794 This was a shared visit with my supervising physician Dr. Jeanell Sparrow who independently saw and evaluated the patient & provided guidance in evaluation/management/disposition ,in agreement with care  [MB]  1443 SARS Coronavirus 2 by RT PCR: NEGATIVE [MB]    Clinical Course User Index [MB] Lyndel Safe   MDM Rules/Calculators/A&P                           Final Clinical Impression(s) / ED Diagnoses Final diagnoses:  Acute on chronic congestive heart failure, unspecified heart failure type [facility])    Rx / DC Orders ED Discharge Orders    None       Garald Balding, PA-C 03/13/21 1452    Pattricia Boss, MD 03/13/21 [phone]

## 2021-03-13 NOTE — Progress Notes (Signed)
RT note. Asked pt. If he would like to wear his BIPAP tonight, pt. Stated if he doesn't have to he would prefer not to. RT told pt. To let his RN know if he decides to change his mind or if ^ WOB. RT will continue to monitor.

## 2021-03-13 NOTE — ED Notes (Signed)
Rt switched pt to 4L [place] from bipap for trial

## 2021-03-13 NOTE — ED Notes (Signed)
Pt reports his pulse ox normally ranges between mid 80's and low 90's on 4.5L home 02.

## 2021-03-13 NOTE — ED Notes (Signed)
Pt SpO2 100% on 50% FiO2, decreased to 45% FiO2, pt continues to tolerate the BiPap and is resting, will continue to monitor.

## 2021-03-13 NOTE — ED Notes (Signed)
Decreased FiO2 to 40% on Bipap, pt is resting.

## 2021-03-13 NOTE — ED Notes (Signed)
Pt taken off NRB 15L and placed on 4.5L [place] with capnography as pt is on 4.5L [place] at home.

## 2021-03-13 NOTE — ED Notes (Addendum)
Pt SpO2 is 100% on 70% FiO2 therefore I decreased his BiPap setting to 60% at this time.  Will continues to monitor.  Pt expresses that he "feels much better"

## 2021-03-13 NOTE — Progress Notes (Signed)
Pt taken off bipap at this time and placed on 4L [place]. RN aware. Pt very possibly will need to go back on bipap later today or tonight as he has stated the lasix has not worked yet

## 2021-03-13 NOTE — H&P (Signed)
History and Physical    Brandon [name] UEA:[id number] DOB: 1946-02-20 DOA: 03/13/2021  Referring MD/NP/PA: [name], PA-C PCP: Pcp, No  Patient coming from: Home via EMS  Chief Complaint: Shortness of breath  I have personally briefly reviewed patient's old medical records in [place]   HPI: Brandon [name] is a 75 y.o. male with medical history significant of hypertension, CAD s/p stent in 01/19/2021, COPD, chronic respiratory failure with hypoxia on 4.5 L of oxygen, PVD, and DM type II who presents with shortness of breath.  History is obtained from patient as well as his daughter who is present at bedside.  Patient reports symptoms started about 2 days ago when they lost power for a few hours.  He was then able to look at this oxygen and ultimately ended up calling 911.  After they arrived and assessed him and recommended transport to the hospital, but the patient declined.  However was restored to his home shortly thereafter.  Patient reports that his weight is supposed be around 160-161 pounds.  He reports that over the last day he is up approximately 4 pounds, but notes that his weight was around 170 pounds last time he checked.  He complains of having abdominal swelling and leg swelling and states that he still been urinating without any issues.  Denies having any significant fever, chills, chest pain, cough, or wheezing.  [name] states that he has been taking all of his medications as advised and has not missed any doses.  He was just recently hospitalized for 2/20-2/25 for acute on chronic respiratory failure with hypoxia secondary to diastolic congestive heart failure and COPD exacerbation with concern for committee acquired pneumonia.  O2 saturations noted to be in the 60-70s when [facility] fire department arrived.  Patient was placed on a nonrebreather and had been given 125 mg of Solu-Medrol with EMS.  ED Course: Upon admission into the emergency department patient was  seen to be afebrile, pulse 88-1 11, respiration 14-22, blood pressures 106/66-120/69, and O2 saturation currently maintained on BiPAP.  Labs significant for hemoglobin 11.2, BUN 19, creatinine 1.46, BNP 110.5, and high-sensitivity troponin 65.  Chest x-ray significant for interstitial edema with small bilateral pleural effusions suggestive of congestive heart failure.  Influenza and COVID-19 screening were both negative.  Patient had been given Lasix 80 mg IV.  TRH called to admit.  Review of Systems  Constitutional: Positive for malaise/fatigue. Negative for fever and weight loss.       Positive for weight gain  HENT: Positive for hearing loss.   Eyes: Negative for double vision and photophobia.  Respiratory: Positive for shortness of breath. Negative for cough and wheezing.   Cardiovascular: Positive for leg swelling. Negative for chest pain.  Gastrointestinal: Negative for nausea and vomiting.  Genitourinary: Negative for dysuria and hematuria.  Musculoskeletal: Negative for falls.  Neurological: Negative for focal weakness and loss of consciousness.  Psychiatric/Behavioral: Negative for substance abuse.  All other systems reviewed and are negative.   Past Medical History:  Diagnosis Date  . Abdominal aortic aneurysm without rupture ([place])   . CAD (coronary artery disease)    severe on chest CT   . Chronic alcoholism ([place])   . Chronic idiopathic constipation   . Chronic pancreatitis ([place])   . Chronic respiratory failure with hypoxia ([place])   . Essential hypertension   . GERD (gastroesophageal reflux disease)   . Hyperaldosteronism ([place])   . Malignant neoplasm of prostate ([place])   . Nicotine dependence   .  Paroxysmal atrial tachycardia ([place])   . Peripheral vascular disease ([place])   . Serrated polyp of colon   . Type 2 diabetes mellitus ([place])     Past Surgical History:  Procedure Laterality Date  . CORONARY ATHERECTOMY N/A 01/11/2021   Procedure: CORONARY ATHERECTOMY;  Surgeon:  Sherren Mocha, MD;  Location: [facility];  Service: Cardiovascular;  Laterality: N/A;  . CORONARY ATHERECTOMY N/A 01/13/2021   Procedure: CORONARY ATHERECTOMY;  Surgeon: Martinique, Peter M, MD;  Location: [facility];  Service: Cardiovascular;  Laterality: N/A;  . CORONARY STENT INTERVENTION N/A 01/13/2021   Procedure: CORONARY STENT INTERVENTION;  Surgeon: Martinique, Peter M, MD;  Location: [facility];  Service: Cardiovascular;  Laterality: N/A;  . INTRAVASCULAR ULTRASOUND/IVUS N/A 01/13/2021   Procedure: Intravascular Ultrasound/IVUS;  Surgeon: Martinique, Peter M, MD;  Location: [facility];  Service: Cardiovascular;  Laterality: N/A;  . PROSTATECTOMY  12/2012  . RIGHT/LEFT HEART CATH AND CORONARY ANGIOGRAPHY N/A 01/11/2021   Procedure: RIGHT/LEFT HEART CATH AND CORONARY ANGIOGRAPHY;  Surgeon: Sherren Mocha, MD;  Location: [facility];  Service: Cardiovascular;  Laterality: N/A;     reports that he quit smoking about 5 months ago. His smoking use included cigarettes. He has a 30.00 pack-year smoking history. He has never used smokeless tobacco. He reports previous alcohol use. He reports that he does not use drugs.  Allergies  Allergen Reactions  . Furosemide Swelling and Other (See Comments)    Cramping, also (patient disputes this in 2022)  . Hydrochlorothiazide Other (See Comments)    Hypokalemia (patient disputes this in 2022)    Family History  Problem Relation Age of Onset  . Hypertension Mother   . Hypertension Father     Prior to Admission medications   Medication Sig Start Date End Date Taking? Authorizing Provider  acetaminophen (TYLENOL) 325 MG tablet Take 2 tablets (650 mg total) by mouth every 6 (six) hours as needed for mild pain (or Fever >/= 101). 01/14/21   Raiford Noble Latif, DO  albuterol (VENTOLIN HFA) 108 (90 Base) MCG/ACT inhaler Inhale 2 puffs into the lungs every 4 (four) hours as needed for wheezing or shortness of breath. 11/24/19    [provider]  aspirin 81 MG EC tablet Take 1 tablet (81 mg total) by mouth daily. 10/21/20   Little Ishikawa, MD  atorvastatin (LIPITOR) 40 MG tablet Take 40 mg by mouth at bedtime.  06/11/19   [provider]  bisoprolol (ZEBETA) 5 MG tablet Take 1 tablet (5 mg total) by mouth daily. 01/14/21   Raiford Noble Latif, DO  clopidogrel (PLAVIX) 75 MG tablet Take 1 tablet (75 mg total) by mouth daily. 10/22/20   Little Ishikawa, MD  diltiazem (CARDIZEM CD) 240 MG 24 hr capsule Take 1 capsule (240 mg total) by mouth daily. 01/14/21   Raiford Noble Latif, DO  empagliflozin (JARDIANCE) 10 MG TABS tablet Take 1 tablet (10 mg total) by mouth daily. 02/24/21   Geradine Girt, DO  empagliflozin (JARDIANCE) 10 MG TABS tablet Take 1 tablet (10 mg total) by mouth daily before breakfast. 03/10/21   Geradine Girt, DO  feeding supplement, GLUCERNA SHAKE, (GLUCERNA SHAKE) LIQD Take 237 mLs by mouth 2 (two) times daily between meals. Patient taking differently: Take 237 mLs by mouth daily. 01/14/21   Raiford Noble Latif, DO  Fluticasone-Salmeterol (ADVAIR) 250-50 MCG/DOSE AEPB Inhale 1 puff into the lungs 2 (two) times daily.    [provider]  furosemide (LASIX) 40 MG tablet Take 1 tablet (40 mg total) by mouth 2 (two) times daily. 02/24/21   Geradine Girt, DO  insulin aspart protamine - aspart (NOVOLOG 70/30 MIX) (70-30) 100 UNIT/ML FlexPen Inject 0.2 mLs (20 Units total) into the skin 2 (two) times daily with a meal. 01/14/21   Sheikh, Georgina Quint Latif, DO  magnesium oxide (MAG-OX) 400 (241.3 Mg) MG tablet Take 1 tablet (400 mg total) by mouth daily. Patient not taking: No sig reported 01/14/21   Raiford Noble Latif, DO  metFORMIN (GLUCOPHAGE) 1000 MG tablet Take 1,000 mg by mouth 2 (two) times daily with a meal.     [provider]  Multiple Vitamin (MULTIVITAMIN WITH MINERALS) TABS tablet Take 1 tablet by mouth daily. Patient not taking: No sig reported 01/14/21   Raiford Noble Latif, DO  nortriptyline (PAMELOR) 50 MG capsule Take 50 mg by mouth at bedtime.    [provider]  Omega-3 1000 MG CAPS Take 1,000 mg by mouth in the morning and at bedtime.     [provider]  oxybutynin (DITROPAN-XL) 10 MG 24 hr tablet Take 10 mg by mouth daily.    [provider]  OXYGEN Inhale 4.5 L/min into the lungs continuous.    [provider]  pantoprazole (PROTONIX) 40 MG tablet Take 1 tablet (40 mg total) by mouth daily. Patient taking differently: Take 40 mg by mouth daily before breakfast. 01/14/21   Raiford Noble Latif, DO  potassium chloride (KLOR-CON) 10 MEQ tablet Take 2 tablets (20 mEq total) by mouth daily. 02/24/21   Geradine Girt, DO  senna-docusate (SENOKOT-S) 8.6-50 MG tablet Take 1 tablet by mouth at bedtime as needed for mild constipation. Patient taking differently: Take 1 tablet by mouth daily at 6 (six) AM. 01/14/21   Raiford Noble Latif, DO  Tiotropium Bromide Monohydrate 2.5 MCG/ACT AERS Inhale 2 puffs into the lungs daily.    [provider]    Physical Exam:  Constitutional: Elderly male currently in no acute distress. Vitals:   03/13/21 1326 03/13/21 1330 03/13/21 1400 03/13/21 1415  BP:  113/65 106/66 114/68  Pulse:  93 89 88  Resp:  18 17 18   Temp:      TempSrc:      SpO2:  100% 100% 95%  Weight:      Height: 6' (1.829 m)      Eyes: PERRL, lids and conjunctivae normal ENMT: Mucous membranes are moist. Posterior pharynx clear of any exudate or lesions.  Neck: normal, supple, no masses, no thyromegaly.  JVD appreciated.   Respiratory:Positive crackles heard intermittently throughout both lung fields.  No significant wheezes or rhonchi.  Patient currently back on 5 L nasal cannula oxygen with O2 saturations in the upper 80s. Cardiovascular: Regular rate and rhythm, no murmurs / rubs / gallops.  At least +1 bilateral lower extremity edema. Abdomen: no tenderness, no masses palpated. No  hepatosplenomegaly. Bowel sounds positive.  Musculoskeletal: no clubbing / cyanosis. No joint deformity upper and lower extremities. Good ROM, no contractures. Normal muscle tone.  Skin: no rashes, lesions, ulcers. No induration Neurologic: CN 2-12 grossly intact. Sensation intact, DTR normal. Strength 5/5 in all 4.  Psychiatric: Normal judgment and insight. Alert and oriented x 3. Normal mood.     Labs on Admission: I have personally reviewed following labs and imaging studies  CBC: Recent Labs  Lab 03/13/21 1241  WBC 6.9  NEUTROABS 5.0  HGB 11.2*  HCT 37.3*  MCV 89.7  PLT 612   Basic Metabolic Panel: Recent Labs  Lab 03/13/21 1241  NA 139  K 3.6  CL 103  CO2 25  GLUCOSE 86  BUN 19  CREATININE 1.46*  CALCIUM 8.9   GFR: Estimated Creatinine Clearance: 48 mL/min (A) (by C-G formula based on SCr of 1.46 mg/dL (H)). Liver Function Tests: Recent Labs  Lab 03/13/21 1241  AST 19  ALT 14  ALKPHOS 59  BILITOT 1.1  PROT 5.8*  ALBUMIN 3.1*   No results for input(s): LIPASE, AMYLASE in the last 168 hours. No results for input(s): AMMONIA in the last 168 hours. Coagulation Profile: No results for input(s): INR, PROTIME in the last 168 hours. Cardiac Enzymes: No results for input(s): CKTOTAL, CKMB, CKMBINDEX, TROPONINI in the last 168 hours. BNP (last 3 results) No results for input(s): PROBNP in the last 8760 hours. HbA1C: No results for input(s): HGBA1C in the last 72 hours. CBG: No results for input(s): GLUCAP in the last 168 hours. Lipid Profile: No results for input(s): CHOL, HDL, LDLCALC, TRIG, CHOLHDL, LDLDIRECT in the last 72 hours. Thyroid Function Tests: No results for input(s): TSH, T4TOTAL, FREET4, T3FREE, THYROIDAB in the last 72 hours. Anemia Panel: No results for input(s): VITAMINB12, FOLATE, FERRITIN, TIBC, IRON, RETICCTPCT in the last 72 hours. Urine analysis:    Component Value Date/Time   COLORURINE YELLOW 01/07/2021 2010   APPEARANCEUR CLEAR  01/07/2021 2010   LABSPEC 1.006 01/07/2021 2010   PHURINE 5.0 01/07/2021 2010   GLUCOSEU NEGATIVE 01/07/2021 2010   HGBUR NEGATIVE 01/07/2021 2010   [place] NEGATIVE 01/07/2021 2010   [place] NEGATIVE 01/07/2021 2010   PROTEINUR NEGATIVE 01/07/2021 2010   NITRITE NEGATIVE 01/07/2021 2010   LEUKOCYTESUR NEGATIVE 01/07/2021 2010   Sepsis Labs: Recent Results (from the past 240 hour(s))  Resp Panel by RT-PCR (Flu A&B, Covid) Nasopharyngeal Swab     Status: None   Collection Time: 03/13/21 12:41 PM   Specimen: Nasopharyngeal Swab; Nasopharyngeal(NP) swabs in vial transport medium  Result Value Ref Range Status   SARS Coronavirus 2 by RT PCR NEGATIVE NEGATIVE Final    Comment: (NOTE) SARS-CoV-2 target nucleic acids are NOT DETECTED.  The SARS-CoV-2 RNA is generally detectable in upper respiratory specimens during the acute phase of infection. The lowest concentration of SARS-CoV-2 viral copies this assay can detect is 138 copies/mL. A negative result does not preclude SARS-Cov-2 infection and should not be used as the sole basis for treatment or other patient management decisions. A negative result may occur with  improper specimen collection/handling, submission of specimen other than nasopharyngeal swab, presence of viral mutation(s) within the areas targeted by this assay, and inadequate number of viral copies(<138 copies/mL). A negative result must be combined with clinical observations, patient history, and epidemiological information. The expected result is Negative.  Fact Sheet for Patients:  EntrepreneurPulse.com.au  Fact Sheet for Healthcare Providers:  IncredibleEmployment.be  This test is no t yet approved or cleared by the [place] FDA and  has been authorized for detection and/or diagnosis of SARS-CoV-2 by FDA under an Emergency Use Authorization (EUA). This EUA will remain  in effect (meaning this test can be used) for  the duration of the COVID-19 declaration under Section 564(b)(1) of the Act, 21 U.S.C.section 360bbb-3(b)(1), unless the authorization is terminated  or revoked sooner.       Influenza A by PCR NEGATIVE NEGATIVE Final   Influenza B by PCR NEGATIVE NEGATIVE Final    Comment: (NOTE) The Xpert Xpress  SARS-CoV-2/FLU/RSV plus assay is intended as an aid in the diagnosis of influenza from Nasopharyngeal swab specimens and should not be used as a sole basis for treatment. Nasal washings and aspirates are unacceptable for Xpert Xpress SARS-CoV-2/FLU/RSV testing.  Fact Sheet for Patients: EntrepreneurPulse.com.au  Fact Sheet for Healthcare Providers: IncredibleEmployment.be  This test is not yet approved or cleared by the [place] FDA and has been authorized for detection and/or diagnosis of SARS-CoV-2 by FDA under an Emergency Use Authorization (EUA). This EUA will remain in effect (meaning this test can be used) for the duration of the COVID-19 declaration under Section 564(b)(1) of the Act, 21 U.S.C. section 360bbb-3(b)(1), unless the authorization is terminated or revoked.  Performed at [facility], [place] [address] [place], [place] [address]      Radiological Exams on Admission: DG Chest Portable 1 View  Result Date: 03/13/2021 CLINICAL DATA:  Shortness of breath. EXAM: PORTABLE CHEST 1 VIEW COMPARISON:  02/21/2021 and CT chest 1022. FINDINGS: Patient is slightly rotated. Trachea is midline. Heart is enlarged. Thoracic aorta is calcified. Mid and lower lung zone predominant mixed interstitial and airspace opacification with small bilateral pleural effusions. IMPRESSION: Congestive heart failure. Electronically Signed   By: Lorin Picket M.D.   On: 03/13/2021 12:42    EKG: Independently reviewed.  Sinus tachycardia 104 bpm with QTC 511  Assessment/Plan Acute on chronic respiratory failure with hypoxia secondary to diastolic  congestive heart failure exacerbation: Patient presents with complaints of worsening shortness of breath with reported 10 pound weight gain.  Chest x-ray significant for congestive heart failure.  BNP 110.5 which appears lower than previous.  Last echo from 2/22 noted EF of 60-65% with grade 1 diastolic dysfunction -Admit to a progressive bed -Heart failure orders set  initiated  -Continuous pulse oximetry with nasal cannula oxygen as needed to keep O2 saturations >92% -Strict I&Os and daily weights -Elevate lower extremities -Lasix 40 mg IV Bid -Reassess in a.m. and adjust diuresis as needed -PT to eval and treat in a.m.  Acute kidney injury: Patient presents with creatinine elevated up to 1.46 with BUN 19.  Baseline creatinine previously have been within normal limits. -Check urinalysis -Check postvoid residuals every shift -Continue to monitor kidney function with diuresis -Will need to continue to counsel patient on need to avoid naproxen given CHF and current kidney function  Elevated troponin(acute) coronary artery disease: On admission high-sensitivity troponin 65-> 59.  He denies any complaints of chest pain.  Suspect secondary to demand.  Patient with NSTEMI in January with PCI to the proximal to mid LAD,orbital atherectomy and placement of drug-eluting stent.  -Continue to aspirin, Plavix, and statin  Prolonged QT interval: QTC 511 -Avoid QT prolonging medication  COPD chronic respiratory failure with hypoxia: At baseline patient on 4.5 L of oxygen 24/7.  He had been initially given 125 mg of Solu-Medrol, but no wheezing on physical exam.  Patient also denies any reports of wheezing prior to coming to the hospital.  Patient was temporarily placed on BiPAP, but able to be weaned down to nasal cannula oxygen. -Wean back to home oxygen as able -Continue pharmacy substitution of Dulera for Advair and Spiriva -Albuterol nebs as needed for shortness of breath/wheezing  Essential  hypertension: Blood pressures currently stable.  Home blood pressure medications include bisoprolol 5 mg daily, Cardizem CD 240 mg daily, and furosemide 40 mg twice daily. -Continue Cardizem and bisoprolol  Normocytic anemia: Hemoglobin stable at 11.2 g/dL.  No reports of bleeding. -  Continue to monitor  Diabetes mellitus type 2: Hemoglobin A1c 6.4 on 01/05/2021.  Home medication regimen includes Metformin 1000 mg twice daily, NovoLog 70/30 insulin 20 units 2 times daily with meals, and jardiance 10 mg daily. -Hypoglycemic protocol -Hold Jardiance and Metformin -Decreased home 70/30 insulin 10 units twice daily with meals due to more controlled diet -CBGs before every meal with sensitive SSI  Hyperlipidemia -Continue atorvastatin  Paroxysmal atrial tachycardia: No signs of atrial fibrillation.  Tobacco abuse, in remission: Patient reportedly quit smoking tobacco last year. -Continue to counsel on need of continued cessation of tobacco use  GERD -Held Protonix due to prolonged QT interval  DVT prophylaxis: Lovenox Code Status: Full Family Communication: Daughter updated bed Disposition Plan: Hopefully discharge home once medically stable Consults called: None Admission status: Inpatient, require more than 2 midnight stay for need of IV diuresis  Norval Morton MD [facility]   If 7PM-7AM, please contact night-coverage   03/13/2021, 2:42 PM

## 2021-03-13 NOTE — ED Notes (Signed)
Clothing was removed and pt was repositioned for comfort.  Pt is on Bipap and tolerating this well at this time.

## 2021-03-13 NOTE — ED Triage Notes (Signed)
Pt arrives from home where his home health nurse called EMS due to respirtatory distress.  Pt has home health 4 days a week.  4L [place] home O2 at all times.  EMS was unable to obtain a spo2 reading and told me that first responders ([facility]) reports that spo2 was in 60's and 70's for them.  Pt arrives on NRB, alert and oriented speaking in short sentences.  Pt had 125mg  solumedrol from ems.  Pt is on lasix and abdomen distended and legs swollen.  Pt states that he has gained 10lbs in the last week.

## 2021-03-13 NOTE — Progress Notes (Signed)
Placed pt on bipap per MD order. Pt states he feels he is breathing fine however, spo2 84%. Pt states he has been having swelling in legs and feet for several weeks

## 2021-03-14 DIAGNOSIS — Z7189 Other specified counseling: Secondary | ICD-10-CM

## 2021-03-14 DIAGNOSIS — J449 Chronic obstructive pulmonary disease, unspecified: Secondary | ICD-10-CM | POA: Diagnosis not present

## 2021-03-14 DIAGNOSIS — I509 Heart failure, unspecified: Secondary | ICD-10-CM | POA: Diagnosis not present

## 2021-03-14 DIAGNOSIS — I10 Essential (primary) hypertension: Secondary | ICD-10-CM | POA: Diagnosis not present

## 2021-03-14 DIAGNOSIS — J441 Chronic obstructive pulmonary disease with (acute) exacerbation: Secondary | ICD-10-CM

## 2021-03-14 DIAGNOSIS — J9621 Acute and chronic respiratory failure with hypoxia: Secondary | ICD-10-CM | POA: Diagnosis not present

## 2021-03-14 DIAGNOSIS — R778 Other specified abnormalities of plasma proteins: Secondary | ICD-10-CM | POA: Diagnosis not present

## 2021-03-14 DIAGNOSIS — I5033 Acute on chronic diastolic (congestive) heart failure: Secondary | ICD-10-CM

## 2021-03-14 DIAGNOSIS — Z515 Encounter for palliative care: Secondary | ICD-10-CM

## 2021-03-14 LAB — BASIC METABOLIC PANEL
Anion gap: 13 (ref 5–15)
BUN: 26 mg/dL — ABNORMAL HIGH (ref 8–23)
CO2: 23 mmol/L (ref 22–32)
Calcium: 8.6 mg/dL — ABNORMAL LOW (ref 8.9–10.3)
Chloride: 98 mmol/L (ref 98–111)
Creatinine, Ser: 1.51 mg/dL — ABNORMAL HIGH (ref 0.61–1.24)
GFR, Estimated: 48 mL/min — ABNORMAL LOW (ref >60)
Glucose, Bld: 296 mg/dL — ABNORMAL HIGH (ref 70–99)
Potassium: 4 mmol/L (ref 3.5–5.1)
Sodium: 134 mmol/L — ABNORMAL LOW (ref 135–145)

## 2021-03-14 LAB — MAGNESIUM: Magnesium: 1.8 mg/dL (ref 1.7–2.4)

## 2021-03-14 LAB — GLUCOSE, CAPILLARY
Glucose-Capillary: 292 mg/dL — ABNORMAL HIGH (ref 70–99)
Glucose-Capillary: 314 mg/dL — ABNORMAL HIGH (ref 70–99)
Glucose-Capillary: 273 mg/dL — ABNORMAL HIGH (ref 70–99)
Glucose-Capillary: 251 mg/dL — ABNORMAL HIGH (ref 70–99)

## 2021-03-14 MED ORDER — INSULIN ASPART PROT & ASPART (70-30 MIX) 100 UNIT/ML ~~LOC~~ SUSP
20.0000 [IU] | Freq: Two times a day (BID) | SUBCUTANEOUS | Status: DC
  Administered 2021-03-14 – 2021-03-15 (×3): 20 [IU] via SUBCUTANEOUS
  Filled 2021-03-14: qty 10

## 2021-03-14 MED ORDER — METHYLPREDNISOLONE SODIUM SUCC 40 MG IJ SOLR
40.0000 mg | Freq: Three times a day (TID) | INTRAMUSCULAR | Status: DC
  Administered 2021-03-14 – 2021-03-16 (×7): 40 mg via INTRAVENOUS
  Filled 2021-03-14 (×6): qty 1

## 2021-03-14 NOTE — Progress Notes (Signed)
Patient ID: Brandon [name], male   DOB: [birth year], 75 y.o.   MRN: [id number]  PROGRESS NOTE    Brandon [name]  TOI:[id number] DOB: November 10, 1946 DOA: 03/13/2021 PCP: Pcp, No   Brief Narrative:  75 y.o. male with medical history significant of hypertension, CAD s/p stent in 01/19/2021, COPD, chronic respiratory failure with hypoxia on 4.5 L of oxygen, PVD,  DM type II and recent hospitalizations for acute on chronic respiratory failure secondary to heart failure/COPD exacerbation with recent hospitalization being from 02/19/2021-02/24/2021 presented with worsening shortness of breath.  Oxygen saturations were noted to be in the 60s to 70s when [facility] fire department arrived.  He was placed on nonrebreather and given Solu-Medrol by EMS.  In ED, he was put on BiPAP.  BNP was 110.5 with high-sensitivity troponin of 65.  Chest x-ray showed interstitial edema with small bilateral pleural effusions suggestive of congestive heart failure.  Influenza and COVID-19 test were negative.  He was subsequently given intravenous Lasix.  Assessment & Plan:   Acute on chronic hypoxic respiratory failure CHF exacerbation COPD exacerbation -Patient has had recent hospitalizations for acute on chronic respiratory failure from CHF and COPD exacerbation.  He wears 4.5 L oxygen via nasal cannula at home. -Presented again with worsening shortness of breath and required nonrebreather initially and subsequently BiPAP.  BNP was 110.5 and high-sensitivity troponin was 65.  Chest x-ray showed signs of possible congestive heart failure.  Influenza and COVID-19 test were negative -Currently on Lasix 40 mg IV every 12 hours.  Strict input and output.  Daily weights.  Fluid restriction.  Continue bisoprolol.  Cardiology consultation -will put him on Solu-Medrol 40 mg IV every 8 hours.  Continue Dulera, inhalers and as needed nebs.  Mildly elevated troponin in a patient with history of CAD/non-STEMI in January with PCI and stenting: Most  likely from demand ischemia -Currently denies any chest pain.  Continue aspirin, statin, Plavix, bisoprolol  Diabetes mellitus type 2 with hyperglycemia--blood sugars on the higher side.  Continue 70/30 insulin along with CBGs with SSI.  Metformin and Jardiance on hold.  Acute kidney injury  -Presented with creatinine of 1.46.  Creatinine 1.51 today.  Monitor.  Mild hyponatremia -probably from fluid overload.  Monitor  Hyperlipidemia -Continue statin  Paroxysmal atrial tachycardia -Resolved.  Currently rate controlled.  Anemia of chronic disease -Probably from chronic illnesses including respiratory and heart failure.  Hemoglobin stable.  Monitor  Prolonged QT interval -QTC 511 on admission.  Avoid QT prolonging medications.  Repeat a.m. EKG  Generalized conditioning -Overall prognosis is guarded to poor considering patient's recurrent hospitalization.  Palliative care consultation for goals of care discussion.   DVT prophylaxis: Lovenox Code Status: CODE STATUS has been changed to DNR by palliative care team Family Communication: None at bedside Disposition Plan: Status is: Inpatient  Remains inpatient appropriate because:Inpatient level of care appropriate due to severity of illness   Dispo: The patient is from: Home              Anticipated d/c is to: Home              Patient currently is not medically stable to d/c.   Difficult to place patient No   Consultants: Cardiology/palliative care  Procedures: None  Antimicrobials: None   Subjective: Patient seen and examined at bedside.  Feels slightly better but still short of breath with minimal exertion.  No overnight fever, vomiting reported.  Currently denies any chest pain.  Objective: Vitals:   03/13/21 2007  03/14/21 0021 03/14/21 0403 03/14/21 0700  BP: 122/61 111/65 (!) 118/58 (!) 118/58  Pulse: 91 93 95 93  Resp: 13 15 17 16   Temp: 98 F (36.7 C) 98.5 F (36.9 C) 97.9 F (36.6 C) 98.5 F (36.9 C)   TempSrc: Oral Oral Oral Oral  SpO2: 99% 95% 97% 96%  Weight:      Height:        Intake/Output Summary (Last 24 hours) at 03/14/2021 1047 Last data filed at 03/13/2021 1612 Gross per 24 hour  Intake -  Output 400 ml  Net -400 ml   Filed Weights   03/13/21 1223  Weight: 78.1 kg    Examination:  General exam: Appears calm and comfortable.  Currently on 8-10 l high flow nasal cannula oxygen.  Looks chronically ill.  Poor historian. Respiratory system: Bilateral decreased breath sounds at bases with scattered crackles Cardiovascular system: S1 & S2 heard, Rate controlled Gastrointestinal system: Abdomen is nondistended, soft and nontender. Normal bowel sounds heard. Extremities: No cyanosis, clubbing; bilateral lower extremity edema present.  Central nervous system: Alert and oriented. No focal neurological deficits. Moving extremities Skin: Bilateral lower extremity chronic skin changes present.  No ecchymosis Psychiatry: Flat affect   Data Reviewed: I have personally reviewed following labs and imaging studies  CBC: Recent Labs  Lab 03/13/21 1241  WBC 6.9  NEUTROABS 5.0  HGB 11.2*  HCT 37.3*  MCV 89.7  PLT 976   Basic Metabolic Panel: Recent Labs  Lab 03/13/21 1241 03/14/21 0249  NA 139 134*  K 3.6 4.0  CL 103 98  CO2 25 23  GLUCOSE 86 296*  BUN 19 26*  CREATININE 1.46* 1.51*  CALCIUM 8.9 8.6*  MG  --  1.8   GFR: Estimated Creatinine Clearance: 46.4 mL/min (A) (by C-G formula based on SCr of 1.51 mg/dL (H)). Liver Function Tests: Recent Labs  Lab 03/13/21 1241  AST 19  ALT 14  ALKPHOS 59  BILITOT 1.1  PROT 5.8*  ALBUMIN 3.1*   No results for input(s): LIPASE, AMYLASE in the last 168 hours. No results for input(s): AMMONIA in the last 168 hours. Coagulation Profile: No results for input(s): INR, PROTIME in the last 168 hours. Cardiac Enzymes: No results for input(s): CKTOTAL, CKMB, CKMBINDEX, TROPONINI in the last 168 hours. BNP (last 3  results) No results for input(s): PROBNP in the last 8760 hours. HbA1C: No results for input(s): HGBA1C in the last 72 hours. CBG: Recent Labs  Lab 03/13/21 1716 03/13/21 2146 03/14/21 0739  GLUCAP 115* 245* 292*   Lipid Profile: No results for input(s): CHOL, HDL, LDLCALC, TRIG, CHOLHDL, LDLDIRECT in the last 72 hours. Thyroid Function Tests: No results for input(s): TSH, T4TOTAL, FREET4, T3FREE, THYROIDAB in the last 72 hours. Anemia Panel: No results for input(s): VITAMINB12, FOLATE, FERRITIN, TIBC, IRON, RETICCTPCT in the last 72 hours. Sepsis Labs: No results for input(s): PROCALCITON, LATICACIDVEN in the last 168 hours.  Recent Results (from the past 240 hour(s))  Resp Panel by RT-PCR (Flu A&B, Covid) Nasopharyngeal Swab     Status: None   Collection Time: 03/13/21 12:41 PM   Specimen: Nasopharyngeal Swab; Nasopharyngeal(NP) swabs in vial transport medium  Result Value Ref Range Status   SARS Coronavirus 2 by RT PCR NEGATIVE NEGATIVE Final    Comment: (NOTE) SARS-CoV-2 target nucleic acids are NOT DETECTED.  The SARS-CoV-2 RNA is generally detectable in upper respiratory specimens during the acute phase of infection. The lowest concentration of SARS-CoV-2 viral copies this assay  can detect is 138 copies/mL. A negative result does not preclude SARS-Cov-2 infection and should not be used as the sole basis for treatment or other patient management decisions. A negative result may occur with  improper specimen collection/handling, submission of specimen other than nasopharyngeal swab, presence of viral mutation(s) within the areas targeted by this assay, and inadequate number of viral copies(<138 copies/mL). A negative result must be combined with clinical observations, patient history, and epidemiological information. The expected result is Negative.  Fact Sheet for Patients:  EntrepreneurPulse.com.au  Fact Sheet for Healthcare Providers:   IncredibleEmployment.be  This test is no t yet approved or cleared by the [place] FDA and  has been authorized for detection and/or diagnosis of SARS-CoV-2 by FDA under an Emergency Use Authorization (EUA). This EUA will remain  in effect (meaning this test can be used) for the duration of the COVID-19 declaration under Section 564(b)(1) of the Act, 21 U.S.C.section 360bbb-3(b)(1), unless the authorization is terminated  or revoked sooner.       Influenza A by PCR NEGATIVE NEGATIVE Final   Influenza B by PCR NEGATIVE NEGATIVE Final    Comment: (NOTE) The Xpert Xpress SARS-CoV-2/FLU/RSV plus assay is intended as an aid in the diagnosis of influenza from Nasopharyngeal swab specimens and should not be used as a sole basis for treatment. Nasal washings and aspirates are unacceptable for Xpert Xpress SARS-CoV-2/FLU/RSV testing.  Fact Sheet for Patients: EntrepreneurPulse.com.au  Fact Sheet for Healthcare Providers: IncredibleEmployment.be  This test is not yet approved or cleared by the [place] FDA and has been authorized for detection and/or diagnosis of SARS-CoV-2 by FDA under an Emergency Use Authorization (EUA). This EUA will remain in effect (meaning this test can be used) for the duration of the COVID-19 declaration under Section 564(b)(1) of the Act, 21 U.S.C. section 360bbb-3(b)(1), unless the authorization is terminated or revoked.  Performed at [facility], [place] [address], [place], [place] [address]          Radiology Studies: DG Chest Portable 1 View  Result Date: 03/13/2021 CLINICAL DATA:  Shortness of breath. EXAM: PORTABLE CHEST 1 VIEW COMPARISON:  02/21/2021 and CT chest 1022. FINDINGS: Patient is slightly rotated. Trachea is midline. Heart is enlarged. Thoracic aorta is calcified. Mid and lower lung zone predominant mixed interstitial and airspace opacification with small bilateral  pleural effusions. IMPRESSION: Congestive heart failure. Electronically Signed   By: Lorin Picket M.D.   On: 03/13/2021 12:42        Scheduled Meds: . aspirin EC  81 mg Oral Daily  . atorvastatin  40 mg Oral QHS  . bisoprolol  5 mg Oral Daily  . clopidogrel  75 mg Oral Daily  . diltiazem  240 mg Oral Daily  . enoxaparin (LOVENOX) injection  40 mg Subcutaneous Q24H  . feeding supplement (GLUCERNA SHAKE)  237 mL Oral BID BM  . furosemide  40 mg Intravenous BID  . insulin aspart  0-9 Units Subcutaneous TID WC  . insulin aspart protamine- aspart  10 Units Subcutaneous BID WC  . magnesium oxide  400 mg Oral Daily  . methylPREDNISolone (SOLU-MEDROL) injection  40 mg Intravenous Q8H  . mometasone-formoterol  2 puff Inhalation BID  . potassium chloride  10 mEq Oral Daily  . senna-docusate  1 tablet Oral Q0600  . sodium chloride flush  3 mL Intravenous Q12H  . umeclidinium bromide  1 puff Inhalation Daily   Continuous Infusions: . sodium chloride  Aline August, MD [facility] 03/14/2021, 10:47 AM

## 2021-03-14 NOTE — Evaluation (Signed)
Physical Therapy Evaluation Patient Details Name: Brandon [name] MRN: [id number] DOB: 05-26-1946 Today's Date: 03/14/2021   History of Present Illness  Brandon [name] is a 75 y/o male who reported to ED with complaint of respiratory distress, recent 10lb weight gain, and O2. Pt was admitted on 03/13/21 and diagnosed with acute on chronic respiratory failure with hypoxemia. Recent admission from 2/20-2/25/22 for COPD exacerbation and another admission from 2/2-02/06/21 for COPD and CHF. PMH includes CAD, alcoholism, chronic pancreatitis, CHF, atrial tachycardia, and COPD on 4.5 L home O2.    Clinical Impression  Pt received in bed, cooperative and pleasant. Generally supervision to min guard for mobility, but cues needed for pacing, nose-breathing, and safe navigation with RW. Pt typically on 4.5L O2 at home, but needed 10L to maintain SPO2 >88%. Some difficulties getting a read on SPO2 during ambulation, so unclear if SPO2 held above 88% during ambulation as pt reported increased SOB and fatigued quickly. SPO2 returned to 93% on 10L after sitting for a few minutes. Pt would benefit from PT to address strength, balance, endurance, and increase independency. Recommend short term rehab to ensure pt is safe to return home alone. Pt agreeable to plan. Will continue to follow acutely.    Follow Up Recommendations SNF;Supervision for mobility/OOB    Equipment Recommendations  None recommended by PT (Rollator)    Recommendations for Other Services       Precautions / Restrictions Precautions Precautions: Fall;Other (comment) Precaution Comments: watch O2, 4/5 L at baseline Restrictions Weight Bearing Restrictions: No      Mobility  Bed Mobility Overal bed mobility: Needs Assistance Bed Mobility: Supine to Sit     Supine to sit: Supervision;HOB elevated          Transfers Overall transfer level: Needs assistance Equipment used: Rolling walker (2 wheeled) Transfers: Sit to/from Stand Sit to  Stand: Supervision            Ambulation/Gait Ambulation/Gait assistance: Min guard Gait Distance (Feet): 45 Feet Assistive device: Rolling walker (2 wheeled) Gait Pattern/deviations: Step-through pattern;Decreased stride length;Trunk flexed Gait velocity: decreased   General Gait Details: Min guard for safety, no LOB, cueing for pacing and navigation with RW. Unable to obtain SPO2 reading from monitor for majority of ambulation  Stairs            Wheelchair Mobility    Modified Rankin (Stroke Patients Only)       Balance Overall balance assessment: Needs assistance Sitting-balance support: Feet supported;No upper extremity supported Sitting balance-Leahy Scale: Good     Standing balance support: Single extremity supported;During functional activity Standing balance-Leahy Scale: Poor Standing balance comment: Reliant on B UE support, especially during ambulation                             Pertinent Vitals/Pain Pain Assessment: No/denies pain    Home Living Family/patient expects to be discharged to:: Private residence Living Arrangements: Alone Available Help at Discharge: Family;Available PRN/intermittently;Friend(s) (daughter and neighbors) Type of Home: House Home Access: Stairs to enter Entrance Stairs-Rails: Right;Left;Can reach both Entrance Stairs-Number of Steps: 3 Home Layout: One level Home Equipment: Grab bars - tub/shower;Shower seat;Cane - single point;Walker - 2 wheels;Walker - 4 wheels Additional Comments: daughter is working with [employer] on renovating bathroom and getting him a w/c    Prior Function Level of Independence: Needs assistance   Gait / Transfers Assistance Needed: using walker in past week PTA, denies falls  ADL's / Homemaking  Assistance Needed: requires assist for shower transfers. daughter visits every week and assists with IADL's        Hand Dominance   Dominant Hand: Right    Extremity/Trunk Assessment    Upper Extremity Assessment Upper Extremity Assessment: Overall WFL for tasks assessed    Lower Extremity Assessment Lower Extremity Assessment: Generalized weakness    Cervical / Trunk Assessment Cervical / Trunk Assessment: Kyphotic  Communication   Communication: HOH  Cognition Arousal/Alertness: Awake/alert Behavior During Therapy: WFL for tasks assessed/performed Overall Cognitive Status: Within Functional Limits for tasks assessed                                 General Comments: A&Ox4. Cooperative, responds appropriately with tangential answers      General Comments General comments (skin integrity, edema, etc.): Pt received on 10L HFNC sitting in bed, SPO2 88-91%. For sit to stand from EOB and walk to door, SPO2 held between 88-91% on 8L, but unable to get reading for rest of ambulation. Pt fatigued and SOB. Returned to 10L O2. Took a few minutes to get reading once in sitting. SPO2 >92% after sitting for several minutes on 10L.    Exercises     Assessment/Plan    PT Assessment Patient needs continued PT services  PT Problem List         PT Treatment Interventions DME instruction;Gait training;Stair training;Functional mobility training;Therapeutic activities;Therapeutic exercise;Balance training;Neuromuscular re-education;Patient/family education    PT Goals (Current goals can be found in the Care Plan section)  Acute Rehab PT Goals Patient Stated Goal: breathe easier PT Goal Formulation: With patient Time For Goal Achievement: 03/28/21 Potential to Achieve Goals: Good    Frequency Min 3X/week   Barriers to discharge        Co-evaluation               AM-PAC PT "6 Clicks" Mobility  Outcome Measure Help needed turning from your back to your side while in a flat bed without using bedrails?: A Little Help needed moving from lying on your back to sitting on the side of a flat bed without using bedrails?: A Little Help needed moving to and  from a bed to a chair (including a wheelchair)?: A Little Help needed standing up from a chair using your arms (e.g., wheelchair or bedside chair)?: A Little Help needed to walk in hospital room?: A Little Help needed climbing 3-5 steps with a railing? : A Lot 6 Click Score: 17    End of Session Equipment Utilized During Treatment: Oxygen;Gait belt Activity Tolerance: Patient tolerated treatment well Patient left: in chair;with call bell/phone within reach;with chair alarm set Nurse Communication: Mobility status PT Visit Diagnosis: Unsteadiness on feet (R26.81);Difficulty in walking, not elsewhere classified (R26.2)    Time: 8115-7262 PT Time Calculation (min) (ACUTE ONLY): 39 min   Charges:   PT Evaluation $PT Eval Moderate Complexity: 1 Mod PT Treatments $Gait Training: 8-22 mins $Therapeutic Activity: 8-22 mins       Rosita Kea, SPT

## 2021-03-14 NOTE — Plan of Care (Signed)
  Problem: Clinical Measurements: Goal: Respiratory complications will improve Outcome: Progressing   Problem: Clinical Measurements: Goal: Cardiovascular complication will be avoided Outcome: Progressing   Problem: Pain Managment: Goal: General experience of comfort will improve Outcome: Progressing   

## 2021-03-14 NOTE — Consult Note (Addendum)
Cardiology Consultation:   Patient ID: Brandon [name]; [id number]; Mar 30, 1946   Admit date: 03/13/2021 Date of Consult: 03/14/2021  Primary Care Provider: Pcp, No Primary Cardiologist: [facility]   Patient Profile:   Brandon [name] is a 75 y.o. 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 Starla Link.   History of Present Illness:   Brandon [name] is a 75yo M with a hx as stated above who presented to [facility] 03/13/21 with a 1 week hx of progressive SOB. He was recently hospitalized and discharge for similar symptoms and has been home for three weeks. He states that he was initially feeling great however last Friday began to have DOE with walking across his house. He reports that his power went out for several hours on Saturday and he was without his supplemental oxygen for about 2 hours. He became anxious and never really felt that his breathing stabilized after that. He attempted to manage at home but ultimately called EMS on Monday for transport to the ED for further evaluation.   He states that he has been complaint with his medications however there is questionable diet indescrestions with sodium intake that may have been contributing to his sympytoms. He reports that he eats "low sodium" frozen dinners for most meals. He lives alone but has a daughter who comes once a week to assist with grocery shopping, bills and medications. He is currenlty unable to get to any doctors appointments and is in the process of getting a wheelchair that will allow the [facility] to come pick him up for appointment. He has [facility] come to his home twoce per week and he states his last weight was noted to be 162lb.   On EMS arrival he was found to be  hypoxic with SpO2 in the 60-70's. He was placed on a non-re breather and was given Solu-Medrol. Labs on arrival showed a creatinine was found to be elevated at 1.46>>1.51. BNP only 110 with CXR significant for interstitial edema with small bilateral pleural effusions suggestive of congestive heart failure. HsT found to be 65>>>59 (prior elevation at >1000 12/2020). He was treated in the ED with IV Lasix 80mg  x1. H  He was most recently seen by our team 02/02/21 during hospital consutlaiton for the evaluation of CHF. He was treated with IV Lasix 40mg  BID. At cardiology sign off he was transitioned to PO dosing 40mg  QD. He had elevated troponin levels however in the absence of chest pain and stable EKG tracings there were no plans for further workup for this. [facility]. :ast echocardiogram 02/21/21 with normal LVEF and G1DD. Prior to this, he was seen 12/2020 for chest pain and underwent LHC that showed severe single vessel CAD of the pLAD with stent intervention performed 01/13/21.   Past Medical History:  Diagnosis Date  . 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 CAD (coronary artery disease)    severe on chest CT   . Chronic alcoholism ([place])   . Chronic idiopathic constipation   . Chronic pancreatitis ([place])   . Chronic respiratory failure with hypoxia ([place])   . Essential hypertension   . GERD (gastroesophageal reflux disease)   . Hyperaldosteronism ([place])   .  Malignant neoplasm of prostate ([place])   . Nicotine dependence   . Paroxysmal atrial tachycardia ([place])   . Peripheral vascular disease ([place])   . Serrated polyp of colon   . Type 2 diabetes mellitus ([place])     Past Surgical History:  Procedure Laterality Date  . CORONARY ATHERECTOMY N/A 01/11/2021   Procedure: CORONARY ATHERECTOMY;  Surgeon: Sherren Mocha, MD;  Location: [facility];  Service: Cardiovascular;  Laterality: N/A;  . CORONARY ATHERECTOMY N/A 01/13/2021   Procedure: CORONARY  ATHERECTOMY;  Surgeon: Martinique, Peter M, MD;  Location: [facility];  Service: Cardiovascular;  Laterality: N/A;  . CORONARY STENT INTERVENTION N/A 01/13/2021   Procedure: CORONARY STENT INTERVENTION;  Surgeon: Martinique, Peter M, MD;  Location: [facility];  Service: Cardiovascular;  Laterality: N/A;  . INTRAVASCULAR ULTRASOUND/IVUS N/A 01/13/2021   Procedure: Intravascular Ultrasound/IVUS;  Surgeon: Martinique, Peter M, MD;  Location: [facility];  Service: Cardiovascular;  Laterality: N/A;  . PROSTATECTOMY  12/2012  . RIGHT/LEFT HEART CATH AND CORONARY ANGIOGRAPHY N/A 01/11/2021   Procedure: RIGHT/LEFT HEART CATH AND CORONARY ANGIOGRAPHY;  Surgeon: Sherren Mocha, MD;  Location: [facility];  Service: Cardiovascular;  Laterality: N/A;     Prior to Admission medications   Medication Sig Start Date End Date Taking? Authorizing Provider  albuterol (VENTOLIN HFA) 108 (90 Base) MCG/ACT inhaler Inhale 2 puffs into the lungs every 4 (four) hours as needed for wheezing or shortness of breath. 11/24/19  Yes [provider]  aspirin 81 MG EC tablet Take 1 tablet (81 mg total) by mouth daily. 10/21/20  Yes Little Ishikawa, MD  atorvastatin (LIPITOR) 40 MG tablet Take 40 mg by mouth at bedtime.  06/11/19  Yes [provider]  bisoprolol (ZEBETA) 5 MG tablet Take 1 tablet (5 mg total) by mouth daily. 01/14/21  Yes Sheikh, Omair Latif, DO  clopidogrel (PLAVIX) 75 MG tablet Take 1 tablet (75 mg total) by mouth daily. 10/22/20  Yes Little Ishikawa, MD  diltiazem (CARDIZEM CD) 240 MG 24 hr capsule Take 1 capsule (240 mg total) by mouth daily. 01/14/21  Yes Sheikh, Omair Latif, DO  empagliflozin (JARDIANCE) 10 MG TABS tablet Take 1 tablet (10 mg total) by mouth daily before breakfast. 03/10/21  Yes Vann, Jessica U, DO  feeding supplement, GLUCERNA SHAKE, (GLUCERNA SHAKE) LIQD Take 237 mLs by mouth 2 (two) times daily between meals. 01/14/21  Yes Sheikh, Omair Latif, DO   Fluticasone-Salmeterol (ADVAIR) 250-50 MCG/DOSE AEPB Inhale 1 puff into the lungs 2 (two) times daily.   Yes [provider]  furosemide (LASIX) 40 MG tablet Take 1 tablet (40 mg total) by mouth 2 (two) times daily. 02/24/21  Yes Eulogio Bear U, DO  insulin aspart protamine - aspart (NOVOLOG 70/30 MIX) (70-30) 100 UNIT/ML FlexPen Inject 0.2 mLs (20 Units total) into the skin 2 (two) times daily with a meal. 01/14/21  Yes Sheikh, Omair Latif, DO  Magnesium Oxide 420 MG TABS Take 420 mg by mouth daily.   Yes [provider]  metFORMIN (GLUCOPHAGE) 1000 MG tablet Take by mouth 2 (two) times daily with a meal.   Yes [provider]  Multiple Vitamin (MULTIVITAMIN WITH MINERALS) TABS tablet Take 1 tablet by mouth daily. 01/14/21  Yes Sheikh, Omair Latif, DO  naproxen sodium (ALEVE) 220 MG tablet Take 440 mg by mouth daily as needed (pain).   Yes [provider]  nortriptyline (PAMELOR) 50 MG capsule Take 50 mg by  mouth at bedtime. For restless leg symdrome   Yes [provider]  Omega-3 1000 MG CAPS Take 1,000 mg by mouth in the morning and at bedtime.    Yes [provider]  OXYGEN Inhale 4.5 L/min into the lungs continuous.   Yes [provider]  pantoprazole (PROTONIX) 40 MG tablet Take 1 tablet (40 mg total) by mouth daily. Patient taking differently: Take 40 mg by mouth daily before breakfast. 01/14/21  Yes Sheikh, Omair Latif, DO  polyethylene glycol (MIRALAX / GLYCOLAX) 17 g packet Take 17 g by mouth daily as needed (constipation). Mix with water or other liquid as instructed before drinking   Yes [provider]  potassium chloride (KLOR-CON) 10 MEQ tablet Take 2 tablets (20 mEq total) by mouth daily. Patient taking differently: Take 10 mEq by mouth daily. 02/24/21  Yes Vann, Jessica U, DO  senna-docusate (SENOKOT-S) 8.6-50 MG tablet Take 1 tablet by mouth at bedtime as needed for mild constipation. Patient taking differently:  Take 1 tablet by mouth daily at 6 (six) AM. 01/14/21  Yes Raiford Noble Latif, DO  Tiotropium Bromide Monohydrate 2.5 MCG/ACT AERS Inhale 2 puffs into the lungs daily.   Yes [provider]   Inpatient Medications: Scheduled Meds: . aspirin EC  81 mg Oral Daily  . atorvastatin  40 mg Oral QHS  . bisoprolol  5 mg Oral Daily  . clopidogrel  75 mg Oral Daily  . diltiazem  240 mg Oral Daily  . enoxaparin (LOVENOX) injection  40 mg Subcutaneous Q24H  . feeding supplement (GLUCERNA SHAKE)  237 mL Oral BID BM  . furosemide  40 mg Intravenous BID  . insulin aspart  0-9 Units Subcutaneous TID WC  . insulin aspart protamine- aspart  20 Units Subcutaneous BID WC  . magnesium oxide  400 mg Oral Daily  . methylPREDNISolone (SOLU-MEDROL) injection  40 mg Intravenous Q8H  . mometasone-formoterol  2 puff Inhalation BID  . potassium chloride  10 mEq Oral Daily  . senna-docusate  1 tablet Oral Q0600  . sodium chloride flush  3 mL Intravenous Q12H  . umeclidinium bromide  1 puff Inhalation Daily   Continuous Infusions: . sodium chloride     PRN Meds: sodium chloride, acetaminophen, albuterol, sodium chloride flush  Allergies:    Allergies  Allergen Reactions  . Hydrochlorothiazide Other (See Comments)    Hypokalemia (patient disputes this in 2022)    Social History:   Social History   Socioeconomic History  . Marital status: Divorced    Spouse name: Not on file  . Number of children: Not on file  . Years of education: Not on file  . Highest education level: Not on file  Occupational History  . Not on file  Tobacco Use  . Smoking status: Former Smoker    Packs/day: 0.50    Years: 60.00    Pack years: 30.00    Types: Cigarettes    Quit date: 10/08/2020    Years since quitting: 0.4  . Smokeless tobacco: Never Used  Substance and Sexual Activity  . Alcohol use: Not Currently  . Drug use: Never  . Sexual activity: Not on file  Other Topics Concern  . Not on file  Social  History Narrative  . Not on file   Social Determinants of Health   Financial Resource Strain: Not on file  Food Insecurity: Not on file  Transportation Needs: Not on file  Physical Activity: Not on file  Stress: Not on file  Social Connections: Not on file  Intimate Partner Violence: Not on file    Family History:   Family History  Problem Relation Age of Onset  . Hypertension Mother   . Hypertension Father    Family Status:  Family Status  Relation Name Status  . Mother  (Not Specified)  . Father  (Not Specified)   ROS:  Please see the history of present illness.  All other ROS reviewed and negative.     Physical Exam/Data:   Vitals:   03/14/21 0021 03/14/21 0403 03/14/21 0700 03/14/21 1115  BP: 111/65 (!) 118/58 (!) 118/58 111/62  Pulse: 93 95 93 98  Resp: 15 17 16 20   Temp: 98.5 F (36.9 C) 97.9 F (36.6 C) 98.5 F (36.9 C) 98.1 F (36.7 C)  TempSrc: Oral Oral Oral Oral  SpO2: 95% 97% 96% 100%  Weight:      Height:        Intake/Output Summary (Last 24 hours) at 03/14/2021 1318 Last data filed at 03/13/2021 1612 Gross per 24 hour  Intake -  Output 400 ml  Net -400 ml   Filed Weights   03/13/21 1223  Weight: 78.1 kg   Body mass index is 23.35 kg/m.   General: Ill appearing, NAD Neck: Negative for carotid bruits. No JVD Lungs:Clear to ausculation bilaterally. No wheezes, rales, or rhonchi. Breathing is unlabored. Cardiovascular: RRR with S1 S2. No murmurs Abdomen: Soft, non-tender, distended. No obvious abdominal masses. Extremities: No edema. Radial pulses 2+ bilaterally Neuro: Alert and oriented. No focal deficits. No facial asymmetry. MAE spontaneously. Psych: Responds to questions appropriately with normal affect.     EKG:  The EKG was personally reviewed and demonstrates: 03/14/21 ST/AT with HR 104bpm Telemetry:  Telemetry was personally reviewed and demonstrates: 03/14/21 ST with rates in the 100 range   Relevant CV  Studies:  Echocardiogram 01/09/2021: Impressions:] 1. Left ventricular ejection fraction, by estimation, is 60 to 65%. The  left ventricle has normal function. The left ventricle has no regional  wall motion abnormalities. There is mild concentric left ventricular  hypertrophy and moderate basal septal  hypertrophy.  2. Right ventricular systolic function is moderately reduced. The right  ventricular size is moderately enlarged. There is severely elevated  pulmonary artery systolic pressure. The estimated right ventricular  systolic pressure is 95.2 mmHg.  3. The mitral valve is degenerative. No evidence of mitral valve  regurgitation. No evidence of mitral stenosis.  4. The inferior vena cava is dilated in size with >50% respiratory  variability, suggesting right atrial pressure of 8 mmHg.  5. The aortic valve is tricuspid. There is moderate calcification of the  aortic valve primarily of the left coronary cusp. There is moderate  thickening of the aortic valve. Aortic valve regurgitation is mild. Mild  to moderate aortic valve  sclerosis/calcification is present, without any evidence of aortic  stenosis.  6. There is mild dilatation at the level of the sinuses of Valsalva,  measuring 37 mm. There is mild dilatation of the ascending aorta,  measuring 42 mm.  7. Tricuspid valve regurgitation is mild to moderate.  8. Right atrial size was moderately dilated.  _______________  Right/Left Cardiac Catheterization 01/11/2021: 1. Severe calcific single-vessel coronary artery disease involving a severely calcified eccentric proximal LAD lesion 2. Patent left main, patent left circumflex with moderate nonobstructive stenosis, patent RCA with moderate nonobstructive stenosis 3. Moderate pulmonary hypertension with normal pulmonary capillary wedge pressure/LVEDP 4. Unsuccessful orbital atherectomy due to inability to fully treat  the lesion.  Recommendations: Recommend another  attempt at PCI from femoral access once the patient recovers from this procedure. Hopefully femoral access will give better guide support and allow for completion of the procedure. I think that the lack of contralateral aortic support and steep angulation of the left main/LAD origin contributed to technical challenges with this case. Plan reviewed with the patient. Continue aspirin and clopidogrel. _______________  Coronary Stent Intervention 01/13/2021:  Prox LAD lesion is 90% stenosed.  Prox LAD to Mid LAD lesion is 60% stenosed.  A drug-eluting stent was successfully placed using a STENT RESOLUTE ONYX 3.5X30.  Post intervention, there is a 0% residual stenosis.  Post intervention, there is a 0% residual stenosis.  1. Successful PCI of the proximal to mid LAD with orbital atherectomy and stenting with DES x 1 and IVUS guidance  Plan: DAPT for one year. Anticipate DC tomorrow if no complications.   Laboratory Data:  Chemistry Recent Labs  Lab 03/13/21 1241 03/14/21 0249  NA 139 134*  K 3.6 4.0  CL 103 98  CO2 25 23  GLUCOSE 86 296*  BUN 19 26*  CREATININE 1.46* 1.51*  CALCIUM 8.9 8.6*  GFRNONAA 50* 48*  ANIONGAP 11 13    Total Protein  Date Value Ref Range Status  03/13/2021 5.8 (L) 6.5 - 8.1 g/dL Final   Albumin  Date Value Ref Range Status  03/13/2021 3.1 (L) 3.5 - 5.0 g/dL Final   AST  Date Value Ref Range Status  03/13/2021 19 15 - 41 U/L Final   ALT  Date Value Ref Range Status  03/13/2021 14 0 - 44 U/L Final   Alkaline Phosphatase  Date Value Ref Range Status  03/13/2021 59 38 - 126 U/L Final   Total Bilirubin  Date Value Ref Range Status  03/13/2021 1.1 0.3 - 1.2 mg/dL Final   Hematology Recent Labs  Lab 03/13/21 1241  WBC 6.9  RBC 4.16*  HGB 11.2*  HCT 37.3*  MCV 89.7  MCH 26.9  MCHC 30.0  RDW 26.5*  PLT 315   Cardiac EnzymesNo results for input(s): TROPONINI in the last 168 hours. No results for input(s): TROPIPOC in the last  168 hours.  BNP Recent Labs  Lab 03/13/21 1241  BNP 110.5*    DDimer No results for input(s): DDIMER in the last 168 hours. TSH:  Lab Results  Component Value Date   TSH 0.533 01/08/2021   Lipids: Lab Results  Component Value Date   CHOL 92 10/14/2020   HDL 41 10/14/2020   LDLCALC 42 10/14/2020   TRIG 45 10/14/2020   CHOLHDL 2.2 10/14/2020   HgbA1c: Lab Results  Component Value Date   HGBA1C 6.4 (H) 01/07/2021   Radiology/Studies:  DG Chest Portable 1 View  Result Date: 03/13/2021 CLINICAL DATA:  Shortness of breath. EXAM: PORTABLE CHEST 1 VIEW COMPARISON:  02/21/2021 and CT chest 1022. FINDINGS: Patient is slightly rotated. Trachea is midline. Heart is enlarged. Thoracic aorta is calcified. Mid and lower lung zone predominant mixed interstitial and airspace opacification with small bilateral pleural effusions. IMPRESSION: Congestive heart failure. Electronically Signed   By: Lorin Picket M.D.   On: 03/13/2021 12:42    Assessment and Plan:   1. Acute on chronic diastolic CHF with acute hypoxic respiratory failure: -Pt reported a 2 days hx of SOB. On EMS arrival he was found to be hypoxic with SpO2 in the 60-70's> placed on a non-re breather and was given Solu-Medrol. BNP only 110 with CXR  significant for interstitial edema with small bilateral pleural effusions suggestive of congestive heart failure. -HsT found to be 65>>>59 (prior elevation at >1000 12/2020) likely in the setting of demand ischemia given flat trend, stable EKG and no angina  -Treated with IV Lasix 80mg  x1>>then transitioned to Lasix 40mg  BID>>>contine however will need to follow renal function closely   -Baseline weight was typically around 160-161lb -Weight today, 172lb  -I&O, net negative 482ml  -Last echocardiogram with LVEF at 60-65% with G1DD 01/2021  2. Mildly elevated HsT with known CAD: -s/p NSTEMI s/p orbital atherectomy/DES to LAD in 12/2020 -Stable EKG and no reports of chest pain  -HsT  65>>59 (prior levels >1000 in 12/2020)>>likely secondary to demand ischemia   -Continue DAPT with ASA and Plavix -Continue statin therapy -May need to add PTA bisoprolol back to regimen   3. Acute on Chronic Hypoxic Respiratory Failure: -Reports symptoms of DOE were exacerbated by being off supplemental oxygen with power outage with poor recovery>>found to be hypoxic on EMS arrival with SpO2 in the 60-70's.  -CXR with findings of mixed interstitial and airspace opacification with small bilateral pleural effusions suggestive of fluid volume overload  4. Paroxysmal atrial tachycardia: -On PTA Diltiazem 240mg  PO QD  -Rate relatively uncontrolled at this time>>low 100's -On PTA bisoprolol>>>may be able to tolerate adding this back to regimen  -Suspect rates will improve as underlying respiratory status improves.  5. Hypertension: -Stable, 118/58>>111/65>>122/61 -Continue Diltiazem -May add bisoprolol back to regimen    6. Hyperlipidemia: -Last LDL, 42 on 10/14/20 -Continue home statin.  7. Thoracic Aortic Aneurysm: -Stable at 4.7cm on CTA in 12/2020 -Monitor with serial imaging to assess for growth -Keep good BP control  For questions or updates, please contact [place] Please consult [email or web address] for contact info under Cardiology/STEMI.   Lyndel Safe NP-C [facility] Pager: [phone] 03/14/2021 1:18 PM  The patient was seen, examined and discussed with Kathyrn Drown, NP  and I agree with the above.   75 y.o. male with a hx of CAD with recent NSTEMI in 12/2020 s/p atherectomy/DES of LAD, paroxysmal atrial tachycardia, PAD, thoracic aortic aneurysm, CVA in10/2021, COPD on chronic 4.5L of O2, HTN, DM2, chronic pancreatitis, alcohol abuse, and hypoadolsteronism who is being seen today for the evaluation of CHF at the request of Dr. Starla Link.   He was hospitalized on 02/19/2021 for CHF, discharged at 164 lbs, has been home for three weeks.  It seems that patient has  difficulties getting around, he has hard time with activities of daily living such as walking around his house, prepping his meals, as a result he does not always use his supplemental oxygen and strictly eats frozen microwave foods. As a result he was 8 pounds about his baseline weight on admission.  On EMS arrival he was found to be hypoxic with SpO2 in the 60-70's. He was placed on a non-re breather and was given Solu-Medrol. Labs on arrival showed a creatinine was found to be elevated at 1.46>>1.51. BNP only 110 with CXR significant for interstitial edema with small bilateral pleural effusions suggestive of congestive heart failure. HsT found to be 65>>>59 (prior elevation at >1000 12/2020). He was treated in the ED with IV Lasix 80mg  x1. H  On physical exam he has elevated JVDs, he is currently on oxygen via nasal cannula, 4 L, S1-S2 regular, there mild crackles in his lungs and he has distended abdomen, however no significant edema in his lower extremities.  Assessment and plan:   Acute  on chronic diastolic CHF secondary to high sodium intake and possible medication noncompliance CAD, recent non-STEMI in January 2022 status post atherectomy and DES to LAD Paroxysmal atrial fibrillation COPD on chronic 4 L of O2 Hypertension  We will continue Lasix 40 mg IV twice daily to achieve baseline weight of 164 pounds.  However bigger picture is that patient needs help possibly placement into a nursing home where he can get healthier mirror, regular medications, help with his home O2 and general hygiene.  We will ask social worker for help.  Ena Dawley, MD 03/14/2021

## 2021-03-14 NOTE — Consult Note (Signed)
Consultation Note Date: 03/14/2021   Patient Name: Brandon [name]  DOB: Jan 22, 1946  MRN: [id number]  Age / Sex: 75 y.o., male  PCP: Pcp, No Referring Physician: Aline August, MD  Reason for Consultation: Establishing goals of care and Psychosocial/spiritual support  HPI/Patient Profile: 75 y.o. male  with past medical history of hypertension, CAD s/p stent in 01/19/2021, COPD, chronic respiratory failure with hypoxia on 4.5 L of oxygen, diastolic CHF, HLD, PVD, and DM type II  admitted on 03/13/2021 with shortness of breath and recurrent acute CHF exaceration.   Patient was recently hospitalized from 2/20 to 2/25 for COPD and CHF exacerbation and questionable CAP. This is the patient's 5th hospitalization in 6 months. Palliative care consulted to provide extra support and assist with determining goals of care.  Clinical Assessment and Goals of Care: Medical records reviewed. Discussed with RN and met at the bedside along with Brandon Lessen NP to discuss diagnosis, prognosis, GOC, EOL wishes, disposition and options. I also discussed with patient's daughter Brandon [name] over the phone upon obtaining permission from the patient.  I introduced Palliative Medicine as specialized medical care for people living with serious illness. It focuses on providing relief from the symptoms and stress of a serious illness. The goal is to improve quality of life for both the patient and the family.   We discussed a brief life review of the patient and then focused on his current illness. Mr. Gaymon lives home alone in [place] and made the decision to stop driving last year. He no longer felt it was safe for himself or others. He served in the [employer] and receives his medical care at the [facility]. Home health has been visiting twice weekly but he has had difficulty securing transportation for outpatient follow ups. He is awaiting a  wheelchair as his daughter is coordinating with the [place]. He has a walker that he uses as needed but not always and he also receives help with ADLs from his neighbors.    Patient's wife Brandon [name] died 3 years ago from similar health conditions to his own. His son died in 21-Feb-1999 and his only daughter Brandon [name] continues to visit him every Saturday from [place]. Brandon [name] drops off TV dinners for the week, cooks meals for the day, and assesses the state of his environment. Mr. Primo used to love cooking, but has not enjoyed that since his wife died. He states he has had a long and good life then relates his current illness to his wife's illness at the end of her life.  I attempted to elicit values and goals of care important to the patient.    Mr. Hippe recognizes that he has functionally declined over the last year and now spends more time in the hospital than at home. He expresses concern that he may soon need to move to his daughter's home or a nursing home. Without prompting, he voices awareness that he is nearing end of life. He laments that his heart and lungs are failing despite his efforts to make  healthy choices. He has not had a drink in 15 years and quit smoking last year. He states "it's out of my hands at this point" and denies having any goals; he questions why he should even think towards the future given how poorly he is doing. He states that only God will determine when he will die. He denies worries or fears when thinking of his mortality. Educated patient on importance of goal-setting and weighing his options. He strongly values his independence and his primary goal is to stay in his own home as long as possible.  The difference between aggressive medical intervention and comfort care was considered in light of the patient's goals of care.   Advanced directives, concepts specific to code status, artifical feeding and hydration, and rehospitalization were considered and discussed.    Mr.  Faria firmly states that he does not want to be "living on machines" including a ventilator and does not want any attempts for resuscitation. He gives permission for his chart to be updated accordingly. His daughter Brandon [name] has advance care documentation that supports this and will be sending it over as soon as possible. She voices frustration over the quality of care he has received from [facility]. She supports patient's wish to stay at home but would be more comfortable with adequate support from CNAs. She speaks highly of PT and RN through the agency. She verbalizes understanding that "his lungs are gone" and says he is at the end stages of COPD.   Hospice and Palliative Care services outpatient were explained and offered.  Discussed the importance of continued conversation with family and the medical providers regarding overall plan of care and treatment options, ensuring decisions are within the context of the patient's values and GOCs.    Questions and concerns were addressed.  Hard Choices booklet left for review. The family was encouraged to call with questions or concerns.  PMT will continue to support holistically.    PATIENT is the primary decision maker. Next of kin is daughter Brandon [name]. She will fax his AD/Living Will to PMT office tomorrow.    SUMMARY OF RECOMMENDATIONS   -DNR/DNI -Continue full scope treatment with ongoing goals of care discussions -Provide therapeutic listening and emotional support -Daughter Brandon [name] will fax AD/Living Will documentation tomorrow -Reached out to LCSW at daughter's request for [facility] recommendations  Code Status/Advance Care Planning:  DNR  Palliative Prophylaxis:   Aspiration, Bowel Regimen and Delirium Protocol  Additional Recommendations (Limitations, Scope, Preferences):  Full Scope Treatment  Psycho-social/Spiritual:   Desire for further Chaplaincy support: undetermined  Additional Recommendations: Caregiving   Support/Resources and Referral to [employer]   Prognosis:   Guarded prognosis given multiple comorbidities and recurrent exacerbations of COPD and CHF with limited social support locally  Discharge Planning: To Be Determined      Primary Diagnoses: Present on Admission: . Acute on chronic respiratory failure with hypoxemia ([place]) . Acute on chronic respiratory failure with hypoxia ([place]) . Prolonged QT interval . COPD without exacerbation ([place]) . Essential hypertension . Elevated troponin . GERD (gastroesophageal reflux disease) . CAD (coronary artery disease)   I have reviewed the medical record, interviewed the patient and family, and examined the patient. The following aspects are pertinent.  Past Medical History:  Diagnosis Date  . Abdominal aortic aneurysm without rupture ([place])   . CAD (coronary artery disease)    severe on chest CT   . Chronic alcoholism ([place])   . Chronic idiopathic constipation   . Chronic pancreatitis (  [place])   . Chronic respiratory failure with hypoxia ([place])   . Essential hypertension   . GERD (gastroesophageal reflux disease)   . Hyperaldosteronism ([place])   . Malignant neoplasm of prostate ([place])   . Nicotine dependence   . Paroxysmal atrial tachycardia ([place])   . Peripheral vascular disease ([place])   . Serrated polyp of colon   . Type 2 diabetes mellitus ([place])    Social History   Socioeconomic History  . Marital status: Divorced    Spouse name: Not on file  . Number of children: Not on file  . Years of education: Not on file  . Highest education level: Not on file  Occupational History  . Not on file  Tobacco Use  . Smoking status: Former Smoker    Packs/day: 0.50    Years: 60.00    Pack years: 30.00    Types: Cigarettes    Quit date: 10/08/2020    Years since quitting: 0.4  . Smokeless tobacco: Never Used  Substance and Sexual Activity  . Alcohol use: Not Currently  . Drug use: Never  . Sexual activity: Not on file  Other  Topics Concern  . Not on file  Social History Narrative  . Not on file   Social Determinants of Health   Financial Resource Strain: Not on file  Food Insecurity: Not on file  Transportation Needs: Not on file  Physical Activity: Not on file  Stress: Not on file  Social Connections: Not on file   Family History  Problem Relation Age of Onset  . Hypertension Mother   . Hypertension Father    Scheduled Meds: . aspirin EC  81 mg Oral Daily  . atorvastatin  40 mg Oral QHS  . bisoprolol  5 mg Oral Daily  . clopidogrel  75 mg Oral Daily  . diltiazem  240 mg Oral Daily  . enoxaparin (LOVENOX) injection  40 mg Subcutaneous Q24H  . feeding supplement (GLUCERNA SHAKE)  237 mL Oral BID BM  . furosemide  40 mg Intravenous BID  . insulin aspart  0-9 Units Subcutaneous TID WC  . insulin aspart protamine- aspart  10 Units Subcutaneous BID WC  . magnesium oxide  400 mg Oral Daily  . methylPREDNISolone (SOLU-MEDROL) injection  40 mg Intravenous Q8H  . mometasone-formoterol  2 puff Inhalation BID  . potassium chloride  10 mEq Oral Daily  . senna-docusate  1 tablet Oral Q0600  . sodium chloride flush  3 mL Intravenous Q12H  . umeclidinium bromide  1 puff Inhalation Daily   Continuous Infusions: . sodium chloride     PRN Meds:.sodium chloride, acetaminophen, albuterol, sodium chloride flush Medications Prior to Admission:  Prior to Admission medications   Medication Sig Start Date End Date Taking? Authorizing Provider  albuterol (VENTOLIN HFA) 108 (90 Base) MCG/ACT inhaler Inhale 2 puffs into the lungs every 4 (four) hours as needed for wheezing or shortness of breath. 11/24/19  Yes [provider]  aspirin 81 MG EC tablet Take 1 tablet (81 mg total) by mouth daily. 10/21/20  Yes Little Ishikawa, MD  atorvastatin (LIPITOR) 40 MG tablet Take 40 mg by mouth at bedtime.  06/11/19  Yes [provider]  bisoprolol (ZEBETA) 5 MG tablet Take 1 tablet (5 mg total) by mouth  daily. 01/14/21  Yes Sheikh, Omair Latif, DO  clopidogrel (PLAVIX) 75 MG tablet Take 1 tablet (75 mg total) by mouth daily. 10/22/20  Yes Little Ishikawa, MD  diltiazem [facility] CD) 240  MG 24 hr capsule Take 1 capsule (240 mg total) by mouth daily. 01/14/21  Yes Sheikh, Omair Latif, DO  empagliflozin (JARDIANCE) 10 MG TABS tablet Take 1 tablet (10 mg total) by mouth daily before breakfast. 03/10/21  Yes Vann, Jessica U, DO  feeding supplement, GLUCERNA SHAKE, (GLUCERNA SHAKE) LIQD Take 237 mLs by mouth 2 (two) times daily between meals. 01/14/21  Yes Sheikh, Omair Latif, DO  Fluticasone-Salmeterol (ADVAIR) 250-50 MCG/DOSE AEPB Inhale 1 puff into the lungs 2 (two) times daily.   Yes [provider]  furosemide (LASIX) 40 MG tablet Take 1 tablet (40 mg total) by mouth 2 (two) times daily. 02/24/21  Yes Eulogio Bear U, DO  insulin aspart protamine - aspart (NOVOLOG 70/30 MIX) (70-30) 100 UNIT/ML FlexPen Inject 0.2 mLs (20 Units total) into the skin 2 (two) times daily with a meal. 01/14/21  Yes Sheikh, Omair Latif, DO  Magnesium Oxide 420 MG TABS Take 420 mg by mouth daily.   Yes [provider]  metFORMIN (GLUCOPHAGE) 1000 MG tablet Take by mouth 2 (two) times daily with a meal.   Yes [provider]  Multiple Vitamin (MULTIVITAMIN WITH MINERALS) TABS tablet Take 1 tablet by mouth daily. 01/14/21  Yes Sheikh, Omair Latif, DO  naproxen sodium (ALEVE) 220 MG tablet Take 440 mg by mouth daily as needed (pain).   Yes [provider]  nortriptyline (PAMELOR) 50 MG capsule Take 50 mg by mouth at bedtime. For restless leg symdrome   Yes [provider]  Omega-3 1000 MG CAPS Take 1,000 mg by mouth in the morning and at bedtime.    Yes [provider]  OXYGEN Inhale 4.5 L/min into the lungs continuous.   Yes [provider]  pantoprazole (PROTONIX) 40 MG tablet Take 1 tablet (40 mg total) by mouth daily. Patient taking differently: Take 40 mg by  mouth daily before breakfast. 01/14/21  Yes Sheikh, Omair Latif, DO  polyethylene glycol (MIRALAX / GLYCOLAX) 17 g packet Take 17 g by mouth daily as needed (constipation). Mix with water or other liquid as instructed before drinking   Yes [provider]  potassium chloride (KLOR-CON) 10 MEQ tablet Take 2 tablets (20 mEq total) by mouth daily. Patient taking differently: Take 10 mEq by mouth daily. 02/24/21  Yes Vann, Jessica U, DO  senna-docusate (SENOKOT-S) 8.6-50 MG tablet Take 1 tablet by mouth at bedtime as needed for mild constipation. Patient taking differently: Take 1 tablet by mouth daily at 6 (six) AM. 01/14/21  Yes Raiford Noble Latif, DO  Tiotropium Bromide Monohydrate 2.5 MCG/ACT AERS Inhale 2 puffs into the lungs daily.   Yes [provider]   Allergies  Allergen Reactions  . Hydrochlorothiazide Other (See Comments)    Hypokalemia (patient disputes this in 2022)     Vital Signs: BP (!) 118/58   Pulse 93   Temp 98.5 F (36.9 C) (Oral)   Resp 16   Ht 6' (1.829 m) Comment: previous entry of 6'6" seems to have been in error  Wt 78.1 kg   SpO2 96%   BMI 23.35 kg/m  Pain Scale: 0-10   Pain Score: 0-No pain   SpO2: SpO2: 96 % O2 Device:SpO2: 96 % O2 Flow Rate: .O2 Flow Rate (L/min): 8 L/min  IO: Intake/output summary:   Intake/Output Summary (Last 24 hours) at 03/14/2021 0937 Last data filed at 03/13/2021 1612 Gross per 24 hour  Intake -  Output 400 ml  Net -400 ml    LBM:  Last BM Date: 03/13/21 Baseline Weight: Weight: 78.1 kg Most recent weight: Weight: 78.1 kg      Palliative Assessment/Data: 50% at best     Time In: 10:00am Time Out: 11:10am Time Total: 70 minutes  Greater than 50% of this time was spent counseling and coordinating care related to the above assessment and plan.  Signed by: Norberta Keens, PA-C Palliative Medicine Team Team phone # [phone]  Thank you for allowing the Palliative Medicine Team to assist in  the care of this patient. Please utilize secure chat with additional questions, if there is no response within 30 minutes please call the above phone number.  Palliative Medicine Team providers are available by phone from 7am to 7pm daily and can be reached through the team cell phone.  Should this patient require assistance outside of these hours, please call the patient's attending physician.

## 2021-03-14 NOTE — Plan of Care (Signed)
Will continue to educate on disease process, medications, and discharge process.

## 2021-03-15 DIAGNOSIS — J9621 Acute and chronic respiratory failure with hypoxia: Secondary | ICD-10-CM | POA: Diagnosis not present

## 2021-03-15 LAB — BASIC METABOLIC PANEL
Anion gap: 8 (ref 5–15)
BUN: 29 mg/dL — ABNORMAL HIGH (ref 8–23)
CO2: 27 mmol/L (ref 22–32)
Calcium: 8.6 mg/dL — ABNORMAL LOW (ref 8.9–10.3)
Chloride: 101 mmol/L (ref 98–111)
Creatinine, Ser: 1.46 mg/dL — ABNORMAL HIGH (ref 0.61–1.24)
GFR, Estimated: 50 mL/min — ABNORMAL LOW (ref >60)
Glucose, Bld: 258 mg/dL — ABNORMAL HIGH (ref 70–99)
Potassium: 3.7 mmol/L (ref 3.5–5.1)
Sodium: 136 mmol/L (ref 135–145)

## 2021-03-15 LAB — CBC WITH DIFFERENTIAL/PLATELET
Abs Immature Granulocytes: 0.03 10*3/uL (ref 0.00–0.07)
Basophils Absolute: 0 10*3/uL (ref 0.0–0.1)
Basophils Relative: 0 %
Eosinophils Absolute: 0 10*3/uL (ref 0.0–0.5)
Eosinophils Relative: 0 %
HCT: 31.7 % — ABNORMAL LOW (ref 39.0–52.0)
Hemoglobin: 10.2 g/dL — ABNORMAL LOW (ref 13.0–17.0)
Immature Granulocytes: 0 %
Lymphocytes Relative: 9 %
Lymphs Abs: 0.7 10*3/uL (ref 0.7–4.0)
MCH: 27.5 pg (ref 26.0–34.0)
MCHC: 32.2 g/dL (ref 30.0–36.0)
MCV: 85.4 fL (ref 80.0–100.0)
Monocytes Absolute: 0.2 10*3/uL (ref 0.1–1.0)
Monocytes Relative: 2 %
Neutro Abs: 7.2 10*3/uL (ref 1.7–7.7)
Neutrophils Relative %: 89 %
Platelets: 319 10*3/uL (ref 150–400)
RBC: 3.71 MIL/uL — ABNORMAL LOW (ref 4.22–5.81)
RDW: 25.8 % — ABNORMAL HIGH (ref 11.5–15.5)
WBC: 8.1 10*3/uL (ref 4.0–10.5)
nRBC: 0 % (ref 0.0–0.2)

## 2021-03-15 LAB — GLUCOSE, CAPILLARY
Glucose-Capillary: 223 mg/dL — ABNORMAL HIGH (ref 70–99)
Glucose-Capillary: 269 mg/dL — ABNORMAL HIGH (ref 70–99)
Glucose-Capillary: 363 mg/dL — ABNORMAL HIGH (ref 70–99)
Glucose-Capillary: 347 mg/dL — ABNORMAL HIGH (ref 70–99)
Glucose-Capillary: 349 mg/dL — ABNORMAL HIGH (ref 70–99)

## 2021-03-15 LAB — MAGNESIUM: Magnesium: 2.1 mg/dL (ref 1.7–2.4)

## 2021-03-15 LAB — HEMOGLOBIN A1C
Hgb A1c MFr Bld: 6.9 % — ABNORMAL HIGH (ref 4.8–5.6)
Mean Plasma Glucose: 151.33 mg/dL

## 2021-03-15 MED ORDER — COVID-19 MRNA VACC (MODERNA) 50 MCG/0.25ML IM SUSP
0.2500 mL | Freq: Once | INTRAMUSCULAR | Status: AC
  Administered 2021-03-15: 0.25 mL via INTRAMUSCULAR
  Filled 2021-03-15: qty 0.25

## 2021-03-15 MED ORDER — INSULIN ASPART 100 UNIT/ML ~~LOC~~ SOLN: 0.0000 [IU] | Freq: Every day | SUBCUTANEOUS | Status: DC

## 2021-03-15 MED ORDER — INSULIN ASPART 100 UNIT/ML ~~LOC~~ SOLN: 0.0000 [IU] | Freq: Three times a day (TID) | SUBCUTANEOUS | Status: DC

## 2021-03-15 MED ORDER — DM-GUAIFENESIN ER 30-600 MG PO TB12: 1.0000 | ORAL_TABLET | Freq: Two times a day (BID) | ORAL | Status: DC | PRN

## 2021-03-15 MED ORDER — INSULIN ASPART 100 UNIT/ML ~~LOC~~ SOLN
5.0000 [IU] | Freq: Once | SUBCUTANEOUS | Status: AC
  Administered 2021-03-15: 5 [IU] via SUBCUTANEOUS

## 2021-03-15 MED ORDER — SENNOSIDES-DOCUSATE SODIUM 8.6-50 MG PO TABS: 1.0000 | ORAL_TABLET | Freq: Every evening | ORAL | Status: DC | PRN

## 2021-03-15 NOTE — Progress Notes (Addendum)
PROGRESS NOTE    Brandon [name]  PXT:[id number] DOB: Aug 17, 1946 DOA: 03/13/2021 PCP: Pcp, No   Brief Narrative:  75 y.o.malewith medical history significant ofhypertension, CADs/pstent in 01/19/2021, COPD, chronic respiratory failure with hypoxia on 4.5L of oxygen, PVD,  DM type II and recent hospitalizations for acute on chronic respiratory failure secondary to heart failure/COPD exacerbation with recent hospitalization being from 02/19/2021-02/24/2021 presented with worsening shortness of breath.  Oxygen saturations were noted to be in the 60s to 70s when [facility] fire department arrived.  He was placed on nonrebreather and given Solu-Medrol by EMS.  In ED, he was put on BiPAP.  BNP was 110.5 with high-sensitivity troponin of 65.  Chest x-ray showed interstitial edema with small bilateral pleural effusions suggestive of congestive heart failure.  Influenza and COVID-19 test were negative.  He was subsequently given intravenous Lasix.   Assessment & Plan:   Principal Problem:   Acute on chronic respiratory failure with hypoxemia (HCC) Active Problems:   Essential hypertension   Type 2 diabetes mellitus (HCC)   CAD (coronary artery disease)   GERD (gastroesophageal reflux disease)   COPD without exacerbation (HCC)   Elevated troponin   Acute on chronic respiratory failure with hypoxia (HCC)   Prolonged QT interval   Acute on chronic congestive heart failure ([place])   Palliative care by specialist   Goals of care, counseling/discussion   Acute hypoxic respiratory failure -Patient is on 8 L nasal cannula.  At home he is on 4.5 L nasal cannula.  This is multifactorial in nature combination of fluid overload and COPD exacerbation.  It appears patient is noncompliant with his dietary intake.  Congestive heart failure with preserved ejection fraction, class III -Patient is quite dyspneic with minimal exertion currently requiring 8 L of nasal cannula -Cardiology team following -Lasix 40  mg IV twice daily -Fluid restriction  Acute COPD exacerbation -Continue Solu-Medrol 40 mg IV every 8 hours -Bronchodilators -I-S/flutter valve  History of CAD status post PCI -Currently chest pain-free.  Continue aspirin Plavix and statin. Continue Bisoprolol.   Diabetes mellitus type 2 with hyperglycemia -Holding off on Metformin and Jardiance -Continue 70/30 insulin sliding scale  Hyperlipidemia -Statin  Paroxysmal atrial tachycardia -Resolved.  Anemia of chronic disease -Hemoglobin at baseline  Medication noncompliance -Unfortunately patient is noncompliant with his medication and dietary intake leading to recurrent hospitalizations.  Seen by Palliative care service.   PT/OT= SNF  PT/OT evaluation performed.  SNF recommended.  SNF appropriate and is felt to need rehab services to restore this patient to their prior level of function to achieve safe transition back to home care.  This patient needs rehab services for at least 5 days/week and skilled nursing services daily to facilitate this transition.  Rehab is been requested as the most appropriate discharge option for this patient and is not felt to be custodial care as evidenced by ambulatory and independent at home = Prior level of functioning.  Patient would benefit from SNF as he will require supervision with ongoing diuresis from CHF exacerbation and also dyspnea from COPD.    DVT prophylaxis: Lovenox Code Status: DNR Family Communication:  Caryn Section.   Status is: Inpatient  Remains inpatient appropriate because:IV treatments appropriate due to intensity of illness or inability to take PO   Dispo: The patient is from: Home              Anticipated d/c is to: SNF  Patient currently is not medically stable to d/c. Currently hypoxic. Maintain hospital state until his O2 requirement is back to his baseline 4.5L. In the meantime getting IV lasix.    Difficult to place patient No       Body  mass index is 23.35 kg/m.  Pressure Injury 01/08/21 Ear Left Stage 2 -  Partial thickness loss of dermis presenting as a shallow open injury with a red, pink wound bed without slough. Under Oxygen Tubing from Home O2 (Active)  01/08/21 1800  Location: Ear  Location Orientation: Left  Staging: Stage 2 -  Partial thickness loss of dermis presenting as a shallow open injury with a red, pink wound bed without slough.  Wound Description (Comments): Under Oxygen Tubing from Home O2  Present on Admission: Yes      Subjective: Still exertional with minimal sob. Still on 8L [place]. States he is complaint with his meds and diet but his daughter doesn't think so.   Review of Systems Otherwise negative except as per HPI, including: General: Denies fever, chills, night sweats or unintended weight loss. Resp: Denies  wheezing Cardiac: Denies chest pain, palpitations, orthopnea, paroxysmal nocturnal dyspnea. GI: Denies abdominal pain, nausea, vomiting, diarrhea or constipation GU: Denies dysuria, frequency, hesitancy or incontinence MS: Denies muscle aches, joint pain or swelling Neuro: Denies headache, neurologic deficits (focal weakness, numbness, tingling), abnormal gait Psych: Denies anxiety, depression, SI/HI/AVH Skin: Denies new rashes or lesions ID: Denies sick contacts, exotic exposures, travel  Examination:  General exam: Appears calm and comfortable; 8L   Respiratory system: b/l bibasilar rhonchi.  Cardiovascular system: S1 & S2 heard, RRR. No JVD, murmurs, rubs, gallops or clicks. No pedal edema. Gastrointestinal system: Abdomen is nondistended, soft and nontender. No organomegaly or masses felt. Normal bowel sounds heard. Central nervous system: Alert and oriented. No focal neurological deficits. Extremities: Symmetric 5 x 5 power. Skin: No rashes, lesions or ulcers Psychiatry: Judgement and insight appear normal. Mood & affect appropriate.    Objective: Vitals:   03/15/21 0317  03/15/21 0700 03/15/21 0815 03/15/21 1226  BP:  124/72 132/74 117/68  Pulse: 91 89 100 95  Resp: 20 18 20 20   Temp: 97.9 F (36.6 C)  98.6 F (37 C) 98.3 F (36.8 C)  TempSrc: Oral  Oral Oral  SpO2: 98% 97% 99% 91%  Weight:      Height:        Intake/Output Summary (Last 24 hours) at 03/15/2021 1334 Last data filed at 03/15/2021 0830 Gross per 24 hour  Intake 243 ml  Output 3325 ml  Net -3082 ml   Filed Weights   03/13/21 1223  Weight: 78.1 kg     Data Reviewed:   CBC: Recent Labs  Lab 03/13/21 1241 03/15/21 0258  WBC 6.9 8.1  NEUTROABS 5.0 7.2  HGB 11.2* 10.2*  HCT 37.3* 31.7*  MCV 89.7 85.4  PLT 315 151   Basic Metabolic Panel: Recent Labs  Lab 03/13/21 1241 03/14/21 0249 03/15/21 0258  NA 139 134* 136  K 3.6 4.0 3.7  CL 103 98 101  CO2 25 23 27   GLUCOSE 86 296* 258*  BUN 19 26* 29*  CREATININE 1.46* 1.51* 1.46*  CALCIUM 8.9 8.6* 8.6*  MG  --  1.8 2.1   GFR: Estimated Creatinine Clearance: 48 mL/min (A) (by C-G formula based on SCr of 1.46 mg/dL (H)). Liver Function Tests: Recent Labs  Lab 03/13/21 1241  AST 19  ALT 14  ALKPHOS 59  BILITOT 1.1  PROT 5.8*  ALBUMIN 3.1*   No results for input(s): LIPASE, AMYLASE in the last 168 hours. No results for input(s): AMMONIA in the last 168 hours. Coagulation Profile: No results for input(s): INR, PROTIME in the last 168 hours. Cardiac Enzymes: No results for input(s): CKTOTAL, CKMB, CKMBINDEX, TROPONINI in the last 168 hours. BNP (last 3 results) No results for input(s): PROBNP in the last 8760 hours. HbA1C: Recent Labs    03/15/21 0258  HGBA1C 6.9*   CBG: Recent Labs  Lab 03/14/21 1131 03/14/21 1543 03/14/21 1959 03/15/21 0810 03/15/21 1200  GLUCAP 314* 273* 251* 223* 269*   Lipid Profile: No results for input(s): CHOL, HDL, LDLCALC, TRIG, CHOLHDL, LDLDIRECT in the last 72 hours. Thyroid Function Tests: No results for input(s): TSH, T4TOTAL, FREET4, T3FREE, THYROIDAB in the last  72 hours. Anemia Panel: No results for input(s): VITAMINB12, FOLATE, FERRITIN, TIBC, IRON, RETICCTPCT in the last 72 hours. Sepsis Labs: No results for input(s): PROCALCITON, LATICACIDVEN in the last 168 hours.  Recent Results (from the past 240 hour(s))  Resp Panel by RT-PCR (Flu A&B, Covid) Nasopharyngeal Swab     Status: None   Collection Time: 03/13/21 12:41 PM   Specimen: Nasopharyngeal Swab; Nasopharyngeal(NP) swabs in vial transport medium  Result Value Ref Range Status   SARS Coronavirus 2 by RT PCR NEGATIVE NEGATIVE Final    Comment: (NOTE) SARS-CoV-2 target nucleic acids are NOT DETECTED.  The SARS-CoV-2 RNA is generally detectable in upper respiratory specimens during the acute phase of infection. The lowest concentration of SARS-CoV-2 viral copies this assay can detect is 138 copies/mL. A negative result does not preclude SARS-Cov-2 infection and should not be used as the sole basis for treatment or other patient management decisions. A negative result may occur with  improper specimen collection/handling, submission of specimen other than nasopharyngeal swab, presence of viral mutation(s) within the areas targeted by this assay, and inadequate number of viral copies(<138 copies/mL). A negative result must be combined with clinical observations, patient history, and epidemiological information. The expected result is Negative.  Fact Sheet for Patients:  EntrepreneurPulse.com.au  Fact Sheet for Healthcare Providers:  IncredibleEmployment.be  This test is no t yet approved or cleared by the [place] FDA and  has been authorized for detection and/or diagnosis of SARS-CoV-2 by FDA under an Emergency Use Authorization (EUA). This EUA will remain  in effect (meaning this test can be used) for the duration of the COVID-19 declaration under Section 564(b)(1) of the Act, 21 U.S.C.section 360bbb-3(b)(1), unless the authorization is  terminated  or revoked sooner.       Influenza A by PCR NEGATIVE NEGATIVE Final   Influenza B by PCR NEGATIVE NEGATIVE Final    Comment: (NOTE) The Xpert Xpress SARS-CoV-2/FLU/RSV plus assay is intended as an aid in the diagnosis of influenza from Nasopharyngeal swab specimens and should not be used as a sole basis for treatment. Nasal washings and aspirates are unacceptable for Xpert Xpress SARS-CoV-2/FLU/RSV testing.  Fact Sheet for Patients: EntrepreneurPulse.com.au  Fact Sheet for Healthcare Providers: IncredibleEmployment.be  This test is not yet approved or cleared by the [place] FDA and has been authorized for detection and/or diagnosis of SARS-CoV-2 by FDA under an Emergency Use Authorization (EUA). This EUA will remain in effect (meaning this test can be used) for the duration of the COVID-19 declaration under Section 564(b)(1) of the Act, 21 U.S.C. section 360bbb-3(b)(1), unless the authorization is terminated or revoked.  Performed at [facility], [place]  [address], [place], [place] [address]          Radiology Studies: No results found.      Scheduled Meds: . aspirin EC  81 mg Oral Daily  . atorvastatin  40 mg Oral QHS  . bisoprolol  5 mg Oral Daily  . clopidogrel  75 mg Oral Daily  . diltiazem  240 mg Oral Daily  . enoxaparin (LOVENOX) injection  40 mg Subcutaneous Q24H  . feeding supplement (GLUCERNA SHAKE)  237 mL Oral BID BM  . furosemide  40 mg Intravenous BID  . insulin aspart  0-9 Units Subcutaneous TID WC  . insulin aspart protamine- aspart  20 Units Subcutaneous BID WC  . magnesium oxide  400 mg Oral Daily  . methylPREDNISolone (SOLU-MEDROL) injection  40 mg Intravenous Q8H  . mometasone-formoterol  2 puff Inhalation BID  . potassium chloride  10 mEq Oral Daily  . senna-docusate  1 tablet Oral Q0600  . sodium chloride flush  3 mL Intravenous Q12H  . umeclidinium bromide  1 puff Inhalation  Daily   Continuous Infusions: . sodium chloride       LOS: 2 days   Time spent= 35 mins    Ankit Arsenio Loader, MD [facility]  If 7PM-7AM, please contact night-coverage  03/15/2021, 1:34 PM

## 2021-03-15 NOTE — Progress Notes (Signed)
This chaplain responded to PMT consult for spiritual care. The Pt. is working on search and find puzzles as I enter, an activity he has enjoyed for a long time.  The chaplain understands the Pt. series of recent admissions leaves him thinking about what will come next with his health diagnosis and care.    The Pt.articulates different scenarios he has discussed with his daughter-Patricia.  The Pt. desires to be independent and accepts rehab with the goal of strength building and returning to his home. The Pt often states he doesn't want to be a burden on anyone. If rehab is not successful and "assisted living" is necessary for his care, the Pt. prefers the location to be in [place], near [place]. Ultimately, the Pt. is accepting of his mortality and leaving the decision in God's hands. The Pt. leans on the experience of his wife's death as a place of context in his decisions.  The chaplain understands communication is important to the Pt. The Pt. expresses he misses having someone to talk to and will often phone a few family and friends. The Pt. appreciated the chaplain visit, stating "I will never pass up the opportunity to talk to someone. The TV doesn't talk back."  This chaplain is available for F/U spiritual care as needed.

## 2021-03-15 NOTE — TOC Initial Note (Addendum)
Transition of Care [facility]) - Initial/Assessment Note    Patient Details  Name: Brandon [name] MRN: [id number] Date of Birth: 1946/06/21  Transition of [facility]) CM/SW Contact:    Brandon Chars, LCSW Phone Number: 03/15/2021, 2:13 PM  Clinical Narrative:   CSW met with pt to discuss discharge plan.  Pt asks CSW to speak with his daughter Brandon [name] and give permission.  Pt agreeable to recommendation for SNF, states he has been to [facility] last year and would prefer to return there. Choice document given.  [facility] and [facility] from [place].  Pt also has [facility] currently.  Pt reports he has one J and J vaccine shot with not booster.  Pt willing to get booster if this would help with SNF placement.   CSW spoke with daughter who is also in agreement with this plan.  CSW spoke with admitting at [facility] as daughter reports pt has [facility] Medicare in addition to [place] health benefits.  Admitting confirms that pt has active [facility], not sure why it is no longer showing on face sheet.  CSW emailed financial counseling to request evaluation for medicaid due to pt potentially needing long term care in the near future.    1500:  CSW spoke with pt daughter.  Pt does not use a bipap, only O2.  She is also in agreement with pt getting covid booster if this will help with SNF.     1530: Dr Reesa Chew approves vaccine booster and orders. 1530: [facility] accepts pt, [facility] does manage this policy.  Auth request submitted.              Expected Discharge Plan: Skilled Nursing Facility Barriers to Discharge: SNF Pending bed offer,Continued Medical Work up   Patient Goals and CMS Choice Patient states their goals for this hospitalization and ongoing recovery are:: "back to my normal life" CMS Medicare.gov Compare Post Acute Care list provided to:: Patient Choice offered to / list presented to : Patient  Expected Discharge Plan and Services Expected Discharge Plan: [place] In-house Referral: Clinical Social Work   Post Acute Care Choice: [place] Living arrangements for the past 2 months: [place]                                      Prior Living Arrangements/Services Living arrangements for the past 2 months: Single Family Home Lives with:: Self Patient language and need for interpreter reviewed:: Yes Do you feel safe going back to the place where you live?: Yes      Need for Family Participation in Patient Care: Yes (Comment) Care giver support system in place?: Yes (comment) Current home services: DME,Home OT,Home PT ([facility] (they are discharging pt)) Criminal Activity/Legal Involvement Pertinent to Current Situation/Hospitalization: No - Comment as needed  Activities of Daily Living Home Assistive Devices/Equipment: Oxygen,Cane (specify quad or straight),Walker (specify type),CBG Meter ADL Screening (condition at time of admission) Patient's cognitive ability adequate to safely complete daily activities?: Yes Is the patient deaf or have difficulty hearing?: No Does the patient have difficulty seeing, even when wearing glasses/contacts?: No Does the patient have difficulty concentrating, remembering, or making decisions?: No Patient able to express need for assistance with ADLs?: Yes Does the patient have difficulty dressing or bathing?: No Independently performs ADLs?: Yes (appropriate for developmental age) Does the patient have difficulty  walking or climbing stairs?: Yes Weakness of Legs: Both Weakness of Arms/Hands: None  Permission Sought/Granted Permission sought to share information with : Family Supports Permission granted to share information with : Yes, Verbal Permission Granted  Share Information with NAME: daughter Brandon [name]  Permission granted to share info w AGENCY: SNF        Emotional Assessment Appearance:: Appears stated age Attitude/Demeanor/Rapport: Engaged Affect  (typically observed): Appropriate,Pleasant Orientation: : Oriented to Self,Oriented to Place,Oriented to  Time,Oriented to Situation Alcohol / Substance Use: Not Applicable Psych Involvement: No (comment)  Admission diagnosis:  Acute on chronic respiratory failure with hypoxia (HCC) [J96.21] Acute on chronic congestive heart failure, unspecified heart failure type [facility]) [I50.9] Patient Active Problem List   Diagnosis Date Noted  . Acute on chronic congestive heart failure ([place])   . Palliative care by specialist   . Goals of care, counseling/discussion   . Prolonged QT interval 03/13/2021  . Protein-calorie malnutrition, severe 02/21/2021  . Acute on chronic respiratory failure with hypoxia ([place]) 02/19/2021  . Normocytic anemia 02/19/2021  . (HFpEF) heart failure with preserved ejection fraction ([place]) 02/01/2021  . Pressure injury of skin 01/09/2021  . Malnutrition of moderate degree 01/09/2021  . Elevated troponin 01/07/2021  . COPD with acute exacerbation ([place]) 01/07/2021  . History of CVA (cerebrovascular accident) 01/07/2021  . COPD exacerbation ([place]) 01/07/2021  . Hypomagnesemia 01/07/2021  . Essential hypertension   . Type 2 diabetes mellitus ([place])   . CAD (coronary artery disease)   . GERD (gastroesophageal reflux disease)   . Acute on chronic respiratory failure with hypoxemia ([place])   . COPD without exacerbation ([place])   . Tobacco abuse   . Hyponatremia   . Hyperkalemia   . Ischemic stroke ([place])    PCP:  Pcp, No Pharmacy:   [place], [place] - [address] [place] [address] Phone: [phone] Fax: [phone]     Social Determinants of Health (SDOH) Interventions    Readmission Risk Interventions Readmission Risk Prevention Plan 02/24/2021 02/06/2021  Transportation Screening Complete Complete  PCP or Specialist Appt within 3-5 Days - Complete  HRI or [place] -  Complete  Social Work Consult for [place] Planning/Counseling - Complete  Palliative Care Screening - Not Applicable  Medication Review Press photographer) Complete Complete  PCP or Specialist appointment within 3-5 days of discharge (No Data) -  [place] or Home Care Consult Complete -  SW Recovery Care/Counseling Consult Complete -  Palliative Care Screening Not Applicable -  [place] Not Applicable -  Some recent data might be hidden

## 2021-03-15 NOTE — Plan of Care (Signed)
°  Problem: Clinical Measurements: °Goal: Respiratory complications will improve °Outcome: Progressing °  °Problem: Clinical Measurements: °Goal: Cardiovascular complication will be avoided °Outcome: Progressing °  °Problem: Pain Managment: °Goal: General experience of comfort will improve °Outcome: Progressing °  °Problem: Safety: °Goal: Ability to remain free from injury will improve °Outcome: Progressing °  °

## 2021-03-15 NOTE — Progress Notes (Signed)
Inpatient Diabetes Program Recommendations  AACE/ADA: New Consensus Statement on Inpatient Glycemic Control (2015)  Target Ranges:  Prepandial:   less than 140 mg/dL      Peak postprandial:   less than 180 mg/dL (1-2 hours)      Critically ill patients:  140 - 180 mg/dL   Lab Results  Component Value Date   GLUCAP 223 (H) 03/15/2021   HGBA1C 6.4 (H) 01/07/2021    Review of Glycemic Control Results for Brandon [name], Brandon [name] (MRN [id number]) as of 03/15/2021 09:40  Ref. Range 03/14/2021 07:39 03/14/2021 11:31 03/14/2021 15:43 03/14/2021 19:59 03/15/2021 08:10  Glucose-Capillary Latest Ref Range: 70 - 99 mg/dL 292 (H) 314 (H) 273 (H) 251 (H) 223 (H)   Inpatient Diabetes Program Recommendations:   -Add Novolog 0-5 units correction hs  Thank you, Nani Gasser. Hanks, RN, MSN, CDE  Diabetes Coordinator Inpatient Glycemic Control Team Team Pager [phone] (8am-5pm) 03/15/2021 9:43 AM

## 2021-03-15 NOTE — NC FL2 (Signed)
[place] LEVEL OF CARE SCREENING TOOL     IDENTIFICATION  Patient Name: Brandon [name] Birthdate: 03-09-1946 Sex: male Admission Date (Current Location): 03/13/2021  [facility] and [place] Number:  Herbalist and Address:  [facility] [place], [place] [address] [place], [place] [address]      Provider Number: [id number]  Attending Physician Name and Address:  Damita Lack, MD  Relative Name and Phone Number:  Dot Lanes Daughter   [phone]    Current Level of Care: Hospital Recommended Level of Care: [place] Prior Approval Number:    Date Approved/Denied:   PASRR Number: [id number]  Discharge Plan: SNF    Current Diagnoses: Patient Active Problem List   Diagnosis Date Noted  . Acute on chronic congestive heart failure ([place])   . Palliative care by specialist   . Goals of care, counseling/discussion   . Prolonged QT interval 03/13/2021  . Protein-calorie malnutrition, severe 02/21/2021  . Acute on chronic respiratory failure with hypoxia ([place]) 02/19/2021  . Normocytic anemia 02/19/2021  . (HFpEF) heart failure with preserved ejection fraction ([place]) 02/01/2021  . Pressure injury of skin 01/09/2021  . Malnutrition of moderate degree 01/09/2021  . Elevated troponin 01/07/2021  . COPD with acute exacerbation ([place]) 01/07/2021  . History of CVA (cerebrovascular accident) 01/07/2021  . COPD exacerbation ([place]) 01/07/2021  . Hypomagnesemia 01/07/2021  . Essential hypertension   . Type 2 diabetes mellitus ([place])   . CAD (coronary artery disease)   . GERD (gastroesophageal reflux disease)   . Acute on chronic respiratory failure with hypoxemia ([place])   . COPD without exacerbation ([place])   . Tobacco abuse   . Hyponatremia   . Hyperkalemia   . Ischemic stroke ([place])     Orientation RESPIRATION BLADDER Height & Weight        O2 Incontinent Weight: 172 lb 2.9 oz (78.1 kg) Height:  6' (182.9 cm) (previous  entry of 6'6" seems to have been in error)  BEHAVIORAL SYMPTOMS/MOOD NEUROLOGICAL BOWEL NUTRITION STATUS      Continent Diet (Heart healthy, carb modified.  See discharge summary)  AMBULATORY STATUS COMMUNICATION OF NEEDS Skin   Independent Verbally Skin abrasions                       Personal Care Assistance Level of Assistance  Bathing,Feeding,Dressing Bathing Assistance: Limited assistance Feeding assistance: Independent Dressing Assistance: Limited assistance     Functional Limitations Info  Sight,Hearing,Speech Sight Info: Adequate Hearing Info: Adequate Speech Info: Adequate    SPECIAL CARE FACTORS FREQUENCY  PT (By licensed PT),OT (By licensed OT)     PT Frequency: 5x week OT Frequency: 5x week            Contractures Contractures Info: Not present    Additional Factors Info  Code Status,Allergies,Insulin Sliding Scale Code Status Info: DNR Allergies Info: HCTZ   Insulin Sliding Scale Info: see discharge summary       Current Medications (03/15/2021):  This is the current hospital active medication list Current Facility-Administered Medications  Medication Dose Route Frequency Provider Last Rate Last Admin  . 0.9 %  sodium chloride infusion  250 mL Intravenous PRN Fuller Plan A, MD      . acetaminophen (TYLENOL) tablet 650 mg  650 mg Oral Q4H PRN Fuller Plan A, MD   650 mg at 03/14/21 1344  . albuterol (PROVENTIL) (2.5 MG/3ML) 0.083% nebulizer solution 2.5 mg  2.5 mg Nebulization  Q6H PRN Norval Morton, MD      . aspirin EC tablet 81 mg  81 mg Oral Daily Tamala Julian, Rondell A, MD   81 mg at 03/15/21 0827  . atorvastatin (LIPITOR) tablet 40 mg  40 mg Oral QHS Fuller Plan A, MD   40 mg at 03/14/21 2124  . bisoprolol (ZEBETA) tablet 5 mg  5 mg Oral Daily Fuller Plan A, MD   5 mg at 03/15/21 0828  . clopidogrel (PLAVIX) tablet 75 mg  75 mg Oral Daily Fuller Plan A, MD   75 mg at 03/15/21 0827  . dextromethorphan-guaiFENesin (MUCINEX DM) 30-600  MG per 12 hr tablet 1 tablet  1 tablet Oral BID PRN Amin, Ankit Chirag, MD      . diltiazem (CARDIZEM CD) 24 hr capsule 240 mg  240 mg Oral Daily Tamala Julian, Rondell A, MD   240 mg at 03/15/21 0827  . enoxaparin (LOVENOX) injection 40 mg  40 mg Subcutaneous Q24H Tamala Julian, Rondell A, MD   40 mg at 03/14/21 1815  . feeding supplement (GLUCERNA SHAKE) (GLUCERNA SHAKE) liquid 237 mL  237 mL Oral BID BM Smith, Rondell A, MD   237 mL at 03/15/21 1231  . furosemide (LASIX) injection 40 mg  40 mg Intravenous BID Fuller Plan A, MD   40 mg at 03/15/21 0827  . insulin aspart (novoLOG) injection 0-9 Units  0-9 Units Subcutaneous TID WC Fuller Plan A, MD   5 Units at 03/15/21 1231  . insulin aspart protamine- aspart (NOVOLOG MIX 70/30) injection 20 Units  20 Units Subcutaneous BID WC Aline August, MD   20 Units at 03/15/21 0827  . magnesium oxide (MAG-OX) tablet 400 mg  400 mg Oral Daily Tamala Julian, Rondell A, MD   400 mg at 03/15/21 0827  . methylPREDNISolone sodium succinate (SOLU-MEDROL) 40 mg/mL injection 40 mg  40 mg Intravenous Q8H Alekh, Kshitiz, MD   40 mg at 03/15/21 1231  . mometasone-formoterol (DULERA) 200-5 MCG/ACT inhaler 2 puff  2 puff Inhalation BID Norval Morton, MD   2 puff at 03/15/21 0828  . potassium chloride (KLOR-CON) CR tablet 10 mEq  10 mEq Oral Daily Fuller Plan A, MD   10 mEq at 03/15/21 0827  . senna-docusate (Senokot-S) tablet 1 tablet  1 tablet Oral Q0600 Norval Morton, MD   1 tablet at 03/15/21 [phone]  . senna-docusate (Senokot-S) tablet 1 tablet  1 tablet Oral QHS PRN Amin, Ankit Chirag, MD      . sodium chloride flush (NS) 0.9 % injection 3 mL  3 mL Intravenous Q12H Smith, Rondell A, MD   3 mL at 03/15/21 0828  . sodium chloride flush (NS) 0.9 % injection 3 mL  3 mL Intravenous PRN Smith, Rondell A, MD      . umeclidinium bromide (INCRUSE ELLIPTA) 62.5 MCG/INH 1 puff  1 puff Inhalation Daily Fuller Plan A, MD   1 puff at 03/15/21 8182     Discharge Medications: Please see  discharge summary for a list of discharge medications.  Relevant Imaging Results:  Relevant Lab Results:   Additional Information SS# [id number]  Joanne Chars, LCSW

## 2021-03-16 DIAGNOSIS — J9621 Acute and chronic respiratory failure with hypoxia: Secondary | ICD-10-CM | POA: Diagnosis not present

## 2021-03-16 DIAGNOSIS — Z7189 Other specified counseling: Secondary | ICD-10-CM | POA: Diagnosis not present

## 2021-03-16 DIAGNOSIS — I509 Heart failure, unspecified: Secondary | ICD-10-CM | POA: Diagnosis not present

## 2021-03-16 DIAGNOSIS — J449 Chronic obstructive pulmonary disease, unspecified: Secondary | ICD-10-CM | POA: Diagnosis not present

## 2021-03-16 LAB — CBC
HCT: 33.5 % — ABNORMAL LOW (ref 39.0–52.0)
Hemoglobin: 10.8 g/dL — ABNORMAL LOW (ref 13.0–17.0)
MCH: 27.7 pg (ref 26.0–34.0)
MCHC: 32.2 g/dL (ref 30.0–36.0)
MCV: 85.9 fL (ref 80.0–100.0)
Platelets: 373 10*3/uL (ref 150–400)
RBC: 3.9 MIL/uL — ABNORMAL LOW (ref 4.22–5.81)
RDW: 25.2 % — ABNORMAL HIGH (ref 11.5–15.5)
WBC: 9.3 10*3/uL (ref 4.0–10.5)
nRBC: 0 % (ref 0.0–0.2)

## 2021-03-16 LAB — BASIC METABOLIC PANEL
Anion gap: 10 (ref 5–15)
BUN: 39 mg/dL — ABNORMAL HIGH (ref 8–23)
CO2: 27 mmol/L (ref 22–32)
Calcium: 8.9 mg/dL (ref 8.9–10.3)
Chloride: 99 mmol/L (ref 98–111)
Creatinine, Ser: 1.11 mg/dL (ref 0.61–1.24)
GFR, Estimated: >60 mL/min (ref >60)
Glucose, Bld: 259 mg/dL — ABNORMAL HIGH (ref 70–99)
Potassium: 3.1 mmol/L — ABNORMAL LOW (ref 3.5–5.1)
Sodium: 136 mmol/L (ref 135–145)

## 2021-03-16 LAB — GLUCOSE, CAPILLARY
Glucose-Capillary: 232 mg/dL — ABNORMAL HIGH (ref 70–99)
Glucose-Capillary: 254 mg/dL — ABNORMAL HIGH (ref 70–99)
Glucose-Capillary: 343 mg/dL — ABNORMAL HIGH (ref 70–99)
Glucose-Capillary: 340 mg/dL — ABNORMAL HIGH (ref 70–99)
Glucose-Capillary: 292 mg/dL — ABNORMAL HIGH (ref 70–99)

## 2021-03-16 LAB — MAGNESIUM: Magnesium: 2.2 mg/dL (ref 1.7–2.4)

## 2021-03-16 LAB — BRAIN NATRIURETIC PEPTIDE: B Natriuretic Peptide: 397.8 pg/mL — ABNORMAL HIGH (ref 0.0–100.0)

## 2021-03-16 MED ORDER — INSULIN ASPART PROT & ASPART (70-30 MIX) 100 UNIT/ML ~~LOC~~ SUSP
25.0000 [IU] | Freq: Two times a day (BID) | SUBCUTANEOUS | Status: DC
  Administered 2021-03-16 – 2021-03-18 (×6): 25 [IU] via SUBCUTANEOUS

## 2021-03-16 MED ORDER — METHYLPREDNISOLONE SODIUM SUCC 40 MG IJ SOLR
40.0000 mg | Freq: Two times a day (BID) | INTRAMUSCULAR | Status: DC
  Administered 2021-03-16 – 2021-03-17 (×2): 40 mg via INTRAVENOUS
  Filled 2021-03-16 (×2): qty 1

## 2021-03-16 MED ORDER — POTASSIUM CHLORIDE CRYS ER 20 MEQ PO TBCR
40.0000 meq | EXTENDED_RELEASE_TABLET | ORAL | Status: AC
  Administered 2021-03-16 (×3): 40 meq via ORAL
  Filled 2021-03-16 (×3): qty 2

## 2021-03-16 NOTE — Progress Notes (Addendum)
Palliative Care Progress Note  HPI: 75 y.o. male  with past medical history of hypertension, CADs/pstent in 01/19/2021, COPD, chronic respiratory failure with hypoxia on 4.5L of oxygen, diastolic CHF, HLD, PVD, and DM type II  admitted on 03/13/2021 with shortness of breath and recurrent acute CHF exaceration.    Patient was recently hospitalized from 2/20 to 2/25 for COPD and CHF exacerbation and questionable CAP. This is the patient's 5th hospitalization in 6 months. Palliative care consulted to provide extra support and assist with determining goals of care.   Medical records reviewed. Discussed with RN/Michaella and assessed patient at the bedside. He is sitting up in the chair comfortably breathing on 9L HFNC O2. Attempted to wean to 7L but with rapid desat to 50's. Recovered to 90's on 8L and tolerating well. Patient denies pain or distress.  Mr. Copeman is glad to be feeling better today. He is appreciate of all the staff and enjoys having people to talk to. He is hopeful to return to [facility] for rehab, as he is familiar with people there. He is still processing how quickly his health has declined over the last year. He understands that he may need to find long term care placement if he is unable to care for himself after rehab.   Reviewed MOST form and AD/Living Will. An extensive conversation was had, covering concepts specific to code status, artifical feeding and hydration, continued IV antibiotics and rehospitalization. Patient states that he discusses all these matters with his daughter Mardene Celeste. He would like to discuss this in person with her. Offered to meet again tomorrow to review together and he agrees. He also agrees to outpatient palliative care services upon discharge.  Spoke with daughter Mardene Celeste over the phone. She will likely be visiting Mr. Lobos at the bedside tomorrow and agrees to complete a MOST form. She will fax AD/Living Will or bring in person tomorrow. No  questions or concerns at this time.  PMT contact information provided. Encouraged to call with additional needs. PMT will continue to support holistically.   Plan: -Wean O2 as tolerated -Meet with patient and daughter Mardene Celeste to complete MOST form tomorrow -Appreciate TOC assistance with outpatient palliative care referral -Anticipate d/c to SNF with palliative services   Total time: 35 minutes  Greater than 50% of time was spent counseling and coordinating care related to the above assessment and plan.  Dorthy Cooler, PA-C Palliative Medicine Team Team phone # [phone]  Thank you for allowing the Palliative Medicine Team to assist in the care of this patient. Please utilize secure chat with additional questions, if there is no response within 30 minutes please call the above phone number.  Palliative Medicine Team providers are available by phone from 7am to 7pm daily and can be reached through the team cell phone.  Should this patient require assistance outside of these hours, please call the patient's attending physician.

## 2021-03-16 NOTE — Progress Notes (Signed)
Physical Therapy Treatment Patient Details Name: Brandon [name] MRN: [id number] DOB: 10-20-1946 Today's Date: 03/16/2021    History of Present Illness Brandon [name] is a 75 y/o male who reported to ED with complaint of respiratory distress, recent 10lb weight gain, and O2. Pt was admitted on 03/13/21 and diagnosed with acute on chronic respiratory failure with hypoxemia. Recent admission from 2/20-2/25/22 for COPD exacerbation and another admission from 2/2-02/06/21 for COPD and CHF. PMH includes CAD, alcoholism, chronic pancreatitis, CHF, atrial tachycardia, and COPD on 4.5 L home O2.    PT Comments    Pt up in chair on arrival. He is agreeable to participate with therapy though irritable about current fluid restriction. Pt ambulated in hallway with dips in SpO2 down to the mid 60's. However pt shows no signs or symptoms of desaturation. Distance limited due to desat. Feel pt will progress well once O2 levels improve. Will continue to follow acutely.     Follow Up Recommendations  SNF;Supervision for mobility/OOB     Equipment Recommendations  None recommended by PT (Rollator)    Recommendations for Other Services       Precautions / Restrictions Precautions Precautions: Fall;Other (comment) Precaution Comments: watch O2, 4/5 L at baseline    Mobility  Bed Mobility               General bed mobility comments: up in chair on arrival    Transfers Overall transfer level: Needs assistance Equipment used: 4-wheeled walker Transfers: Sit to/from Stand Sit to Stand: Supervision         General transfer comment: for safety, STS x2  Ambulation/Gait Ambulation/Gait assistance: Min guard Gait Distance (Feet): 45 Feet (x2) Assistive device: Rolling walker (2 wheeled) Gait Pattern/deviations: Step-through pattern;Decreased stride length;Trunk flexed Gait velocity: decreased Gait velocity interpretation: <1.31 ft/sec, indicative of household ambulator General Gait Details: 45 ft then  seated rest break secondary to SpO2 reading in the mid 60's. However pt asymptomatic for hypoxemia. No SOB, weakness, confusion, or changes in palor noted. Cues for pursed lipped breathing.   Stairs             Wheelchair Mobility    Modified Rankin (Stroke Patients Only)       Balance Overall balance assessment: Needs assistance Sitting-balance support: Feet supported;No upper extremity supported Sitting balance-Leahy Scale: Good Sitting balance - Comments: sitting EOB with supervision   Standing balance support: Single extremity supported;During functional activity Standing balance-Leahy Scale: Poor Standing balance comment: Reliant on B UE support, especially during ambulation                            Cognition Arousal/Alertness: Awake/alert Behavior During Therapy: WFL for tasks assessed/performed Overall Cognitive Status: Within Functional Limits for tasks assessed                                 General Comments: A&Ox4. Cooperative, Mildly irritated this session due to fluid restrictions. Responds appropriately with tangential answers      Exercises      General Comments General comments (skin integrity, edema, etc.): pt on 10L o2 via [place]. SpO2 dropping to mid 60s with gait.      Pertinent Vitals/Pain Pain Assessment: No/denies pain Faces Pain Scale: No hurt    Home Living                      Prior  Function            PT Goals (current goals can now be found in the care plan section) Acute Rehab PT Goals Patient Stated Goal: breathe easier PT Goal Formulation: With patient Time For Goal Achievement: 03/28/21 Potential to Achieve Goals: Good Progress towards PT goals: Progressing toward goals    Frequency    Min 3X/week      PT Plan Current plan remains appropriate    Co-evaluation              AM-PAC PT "6 Clicks" Mobility   Outcome Measure  Help needed turning from your back to your side  while in a flat bed without using bedrails?: A Little Help needed moving from lying on your back to sitting on the side of a flat bed without using bedrails?: A Little Help needed moving to and from a bed to a chair (including a wheelchair)?: A Little Help needed standing up from a chair using your arms (e.g., wheelchair or bedside chair)?: A Little Help needed to walk in hospital room?: A Little Help needed climbing 3-5 steps with a railing? : A Lot 6 Click Score: 17    End of Session Equipment Utilized During Treatment: Oxygen;Gait belt Activity Tolerance: Patient tolerated treatment well Patient left: in chair;with call bell/phone within reach;with chair alarm set Nurse Communication: Mobility status PT Visit Diagnosis: Unsteadiness on feet (R26.81);Difficulty in walking, not elsewhere classified (R26.2)     Time: 2883-3744 PT Time Calculation (min) (ACUTE ONLY): 26 min  Charges:  $Gait Training: 23-37 mins                     Benjiman Core, [place] Pager [phone] Acute Rehab  Allena Katz 03/16/2021, 3:25 PM

## 2021-03-16 NOTE — Plan of Care (Signed)

## 2021-03-16 NOTE — Progress Notes (Signed)
PROGRESS NOTE    Brandon [name]  ACZ:[id number] DOB: 01/16/1946 DOA: 03/13/2021 PCP: Pcp, No   Brief Narrative:  75 y.o.malewith medical history significant ofhypertension, CADs/pstent in 01/19/2021, COPD, chronic respiratory failure with hypoxia on 4.5L of oxygen, PVD,  DM type II and recent hospitalizations for acute on chronic respiratory failure secondary to heart failure/COPD exacerbation with recent hospitalization being from 02/19/2021-02/24/2021 presented with worsening shortness of breath.  Oxygen saturations were noted to be in the 60s to 70s when [facility] fire department arrived.  He was placed on nonrebreather and given Solu-Medrol by EMS.  In ED, he was put on BiPAP.  BNP was 110.5 with high-sensitivity troponin of 65.  Chest x-ray showed interstitial edema with small bilateral pleural effusions suggestive of congestive heart failure.  Influenza and COVID-19 test were negative.  He was subsequently given intravenous Lasix.   Assessment & Plan:   Principal Problem:   Acute on chronic respiratory failure with hypoxemia (HCC) Active Problems:   Essential hypertension   Type 2 diabetes mellitus (HCC)   CAD (coronary artery disease)   GERD (gastroesophageal reflux disease)   COPD without exacerbation (HCC)   Elevated troponin   Acute on chronic respiratory failure with hypoxia (HCC)   Prolonged QT interval   Acute on chronic congestive heart failure ([place])   Palliative care by specialist   Goals of care, counseling/discussion   Acute on Chronic hypoxic respiratory failure -Continues to remain on 8 L nasal cannula.  At home he is on 4.5 L nasal cannula.  This is multifactorial in nature combination of fluid overload and COPD exacerbation.  It appears patient is noncompliant with his dietary intake.  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  Continue aspirin Plavix and statin. Continue Bisoprolol.   Diabetes mellitus type 2 with hyperglycemia -Holding off on Metformin and Jardiance -Continue 70/30 insulin 25 units.  Continue sliding scale and Accu-Chek  Hyperlipidemia -Statin  Paroxysmal atrial tachycardia -Resolved.  Anemia of chronic disease -Hemoglobin at baseline  Medication noncompliance -Unfortunately patient is noncompliant with his medication and dietary intake leading to recurrent hospitalizations.  Seen by Palliative care service.   PT/OT= SNF  PT/OT evaluation performed.  SNF recommended.  SNF appropriate and is felt to need rehab services to restore this patient to their prior level of function to achieve safe transition back to home care.  This patient needs rehab services for at least 5 days/week and skilled nursing services daily to facilitate this transition.  Rehab is been requested as the most appropriate discharge option for this patient and is not felt to be custodial care as evidenced by ambulatory and independent at home = Prior level of functioning.  Patient would benefit from SNF as he will require supervision with ongoing diuresis from CHF exacerbation and also dyspnea from COPD.    DVT prophylaxis: Lovenox Code Status: DNR Family Communication:  Mardene Celeste updated.   Status is: Inpatient  Remains inpatient appropriate because:IV treatments appropriate due to intensity of illness or inability to take PO   Dispo: The patient is from: Home              Anticipated d/c is to: SNF  Patient currently is not medically stable to d/c. Currently hypoxic. Maintain hospital state until his O2 requirement is back to his baseline 4.5L. In the meantime getting IV lasix.    Difficult to place patient  No       Body mass index is 22.78 kg/m.  Pressure Injury 01/08/21 Ear Left Stage 2 -  Partial thickness loss of dermis presenting as a shallow open injury with a red, pink wound bed without slough. Under Oxygen Tubing from Home O2 (Active)  01/08/21 1800  Location: Ear  Location Orientation: Left  Staging: Stage 2 -  Partial thickness loss of dermis presenting as a shallow open injury with a red, pink wound bed without slough.  Wound Description (Comments): Under Oxygen Tubing from Home O2  Present on Admission: Yes      Subjective: Sitting in the bed, Diuresed well over last 24 hrs but still has DOE.  Denies any chest pain.   Review of Systems Otherwise negative except as per HPI, including: General = no fevers, chills, dizziness,  fatigue HEENT/EYES = negative for loss of vision, double vision, blurred vision,  sore throa Cardiovascular= negative for chest pain, palpitation Respiratory/lungs= negative for shortness of breath, cough, wheezing; hemoptysis,  Gastrointestinal= negative for nausea, vomiting, abdominal pain Genitourinary= negative for Dysuria MSK = Negative for arthralgia, myalgias Neurology= Negative for headache, numbness, tingling  Psychiatry= Negative for suicidal and homocidal ideation Skin= Negative for Rash  Examination: Constitutional: Not in acute distress; on 8L [place] Respiratory: bibasilar crackles.  Cardiovascular: Normal sinus rhythm, no rubs Abdomen: Nontender nondistended good bowel sounds Musculoskeletal: No edema noted Skin: No rashes seen Neurologic: CN 2-12 grossly intact.  And nonfocal Psychiatric: Normal judgment and insight. Alert and oriented x 3. Normal mood.     Objective: Vitals:   03/16/21 0750 03/16/21 0800 03/16/21 0900 03/16/21 1000  BP: 137/65 132/76 137/71 118/68  Pulse: 85 91 94   Resp: 18 (!) 24 17 19   Temp: 98.6 F (37 C)     TempSrc: Oral     SpO2: 93% 94% 96%   Weight:      Height:        Intake/Output  Summary (Last 24 hours) at 03/16/2021 1110 Last data filed at 03/16/2021 0000 Gross per 24 hour  Intake 360 ml  Output 1500 ml  Net -1140 ml   Filed Weights   03/13/21 1223 03/16/21 0500  Weight: 78.1 kg 76.2 kg     Data Reviewed:   CBC: Recent Labs  Lab 03/13/21 1241 03/15/21 0258 03/16/21 0308  WBC 6.9 8.1 9.3  NEUTROABS 5.0 7.2  --   HGB 11.2* 10.2* 10.8*  HCT 37.3* 31.7* 33.5*  MCV 89.7 85.4 85.9  PLT 315 319 884   Basic Metabolic Panel: Recent Labs  Lab 03/13/21 1241 03/14/21 0249 03/15/21 0258 03/16/21 0308  NA 139 134* 136 136  K 3.6 4.0 3.7 3.1*  CL 103 98 101 99  CO2 25 23 27 27   GLUCOSE 86 296* 258* 259*  BUN 19 26* 29* 39*  CREATININE 1.46* 1.51* 1.46* 1.11  CALCIUM 8.9 8.6* 8.6* 8.9  MG  --  1.8 2.1 2.2   GFR: Estimated Creatinine Clearance: 62 mL/min (by C-G formula based on SCr of 1.11 mg/dL). Liver Function Tests: Recent Labs  Lab 03/13/21 1241  AST 19  ALT 14  ALKPHOS 59  BILITOT 1.1  PROT 5.8*  ALBUMIN 3.1*   No results for input(s): LIPASE, AMYLASE in the last 168 hours. No  results for input(s): AMMONIA in the last 168 hours. Coagulation Profile: No results for input(s): INR, PROTIME in the last 168 hours. Cardiac Enzymes: No results for input(s): CKTOTAL, CKMB, CKMBINDEX, TROPONINI in the last 168 hours. BNP (last 3 results) No results for input(s): PROBNP in the last 8760 hours. HbA1C: Recent Labs    03/15/21 0258  HGBA1C 6.9*   CBG: Recent Labs  Lab 03/15/21 1600 03/15/21 2156 03/15/21 2158 03/16/21 0204 03/16/21 0749  GLUCAP 363* 349* 347* 232* 254*   Lipid Profile: No results for input(s): CHOL, HDL, LDLCALC, TRIG, CHOLHDL, LDLDIRECT in the last 72 hours. Thyroid Function Tests: No results for input(s): TSH, T4TOTAL, FREET4, T3FREE, THYROIDAB in the last 72 hours. Anemia Panel: No results for input(s): VITAMINB12, FOLATE, FERRITIN, TIBC, IRON, RETICCTPCT in the last 72 hours. Sepsis Labs: No results for  input(s): PROCALCITON, LATICACIDVEN in the last 168 hours.  Recent Results (from the past 240 hour(s))  Resp Panel by RT-PCR (Flu A&B, Covid) Nasopharyngeal Swab     Status: None   Collection Time: 03/13/21 12:41 PM   Specimen: Nasopharyngeal Swab; Nasopharyngeal(NP) swabs in vial transport medium  Result Value Ref Range Status   SARS Coronavirus 2 by RT PCR NEGATIVE NEGATIVE Final    Comment: (NOTE) SARS-CoV-2 target nucleic acids are NOT DETECTED.  The SARS-CoV-2 RNA is generally detectable in upper respiratory specimens during the acute phase of infection. The lowest concentration of SARS-CoV-2 viral copies this assay can detect is 138 copies/mL. A negative result does not preclude SARS-Cov-2 infection and should not be used as the sole basis for treatment or other patient management decisions. A negative result may occur with  improper specimen collection/handling, submission of specimen other than nasopharyngeal swab, presence of viral mutation(s) within the areas targeted by this assay, and inadequate number of viral copies(<138 copies/mL). A negative result must be combined with clinical observations, patient history, and epidemiological information. The expected result is Negative.  Fact Sheet for Patients:  EntrepreneurPulse.com.au  Fact Sheet for Healthcare Providers:  IncredibleEmployment.be  This test is no t yet approved or cleared by the [place] FDA and  has been authorized for detection and/or diagnosis of SARS-CoV-2 by FDA under an Emergency Use Authorization (EUA). This EUA will remain  in effect (meaning this test can be used) for the duration of the COVID-19 declaration under Section 564(b)(1) of the Act, 21 U.S.C.section 360bbb-3(b)(1), unless the authorization is terminated  or revoked sooner.       Influenza A by PCR NEGATIVE NEGATIVE Final   Influenza B by PCR NEGATIVE NEGATIVE Final    Comment: (NOTE) The  Xpert Xpress SARS-CoV-2/FLU/RSV plus assay is intended as an aid in the diagnosis of influenza from Nasopharyngeal swab specimens and should not be used as a sole basis for treatment. Nasal washings and aspirates are unacceptable for Xpert Xpress SARS-CoV-2/FLU/RSV testing.  Fact Sheet for Patients: EntrepreneurPulse.com.au  Fact Sheet for Healthcare Providers: IncredibleEmployment.be  This test is not yet approved or cleared by the [place] FDA and has been authorized for detection and/or diagnosis of SARS-CoV-2 by FDA under an Emergency Use Authorization (EUA). This EUA will remain in effect (meaning this test can be used) for the duration of the COVID-19 declaration under Section 564(b)(1) of the Act, 21 U.S.C. section 360bbb-3(b)(1), unless the authorization is terminated or revoked.  Performed at [facility], [place] [address] [place], [place] [address]          Radiology Studies: No results found.  Scheduled Meds: . aspirin EC  81 mg Oral Daily  . atorvastatin  40 mg Oral QHS  . bisoprolol  5 mg Oral Daily  . clopidogrel  75 mg Oral Daily  . diltiazem  240 mg Oral Daily  . enoxaparin (LOVENOX) injection  40 mg Subcutaneous Q24H  . feeding supplement (GLUCERNA SHAKE)  237 mL Oral BID BM  . furosemide  40 mg Intravenous BID  . insulin aspart  0-9 Units Subcutaneous TID WC  . insulin aspart protamine- aspart  25 Units Subcutaneous BID WC  . magnesium oxide  400 mg Oral Daily  . methylPREDNISolone (SOLU-MEDROL) injection  40 mg Intravenous Q12H  . mometasone-formoterol  2 puff Inhalation BID  . potassium chloride  40 mEq Oral Q4H  . senna-docusate  1 tablet Oral Q0600  . sodium chloride flush  3 mL Intravenous Q12H  . umeclidinium bromide  1 puff Inhalation Daily   Continuous Infusions: . sodium chloride       LOS: 3 days   Time spent= 35 mins    Elliyah Liszewski Arsenio Loader, MD [facility]  If 7PM-7AM,  please contact night-coverage  03/16/2021, 11:10 AM

## 2021-03-16 NOTE — Progress Notes (Signed)
Appropriate Use Committee Chart Review  Chart reviewed by the physician advisor with input from [facility] and the attending MD as needed for review of the appropriateness for SNF referral.  TOC notes, PT/OT/ST notes, nursing notes and physician notes reviewed for medical necessity to determine if the patient's needs are appropriate for short-term rehab to return to a prior level of function versus the likely need for custodial care.  At this time, the patient appears to Medicare criteria for SNF placement. PTA, was living alone.  Per PT:  Prior Function Level of Independence: Needs assistance    Gait / Transfers Assistance Needed: using walker in past week PTA, denies falls   ADL's / Homemaking Assistance Needed: requires assist for shower transfers. daughter visits every week and assists with IADL's      Recommendations: The patient is SNF appropriate for short-term rehab/skilled nursing interventions.  A consult to the Transitions of Care Team should be made to facilitate placement. TOC should begin to discuss contingency planning for the future. He may require more assistance and it is time to begin long-term care options, should he decline functionally. With multiple hospitalizations and a high risk of re-admission, he may be heading toward a more custodial level of care, which Medicare will not pay for.  Risk of re-admission: 29 % (high) ED visits in the last 6 months: 5 Hospitalizations in the past year: Ricardo, MD Chief Physician Advisor 03/16/2021 9:29 AM

## 2021-03-16 NOTE — TOC Progression Note (Signed)
Transition of Care [facility]) - Progression Note    Patient Details  Name: Brandon [name] MRN: [id number] Date of Birth: Mar 17, 1946  [facility] CM/SW Contact  Joanne Chars, LCSW Phone Number: 03/16/2021, 12:08 PM  Clinical Narrative:  SNF auth approved by Everlene Balls.  Ref #[id number].  5 days starting 3/18 with review 3/22.  Artis Dagout fax [phone]     Expected Discharge Plan: [place] Barriers to Discharge: SNF Pending bed offer,Continued Medical Work up  Expected Discharge Plan and Services Expected Discharge Plan: [place] In-house Referral: Clinical Social Work   Post Acute Care Choice: [place] Living arrangements for the past 2 months: Single Family Home                                       Social Determinants of Health (SDOH) Interventions    Readmission Risk Interventions Readmission Risk Prevention Plan 02/24/2021 02/06/2021  Transportation Screening Complete Complete  PCP or Specialist Appt within 3-5 Days - Complete  HRI or [place] - Complete  Social Work Consult for [place] Planning/Counseling - Complete  Palliative Care Screening - Not Applicable  Medication Review Press photographer) Complete Complete  PCP or Specialist appointment within 3-5 days of discharge (No Data) -  [place] or Home Care Consult Complete -  SW Recovery Care/Counseling Consult Complete -  Palliative Care Screening Not Applicable -  [place] Not Applicable -  Some recent data might be hidden

## 2021-03-16 NOTE — Plan of Care (Signed)

## 2021-03-17 DIAGNOSIS — J9621 Acute and chronic respiratory failure with hypoxia: Secondary | ICD-10-CM | POA: Diagnosis not present

## 2021-03-17 DIAGNOSIS — Z7189 Other specified counseling: Secondary | ICD-10-CM | POA: Diagnosis not present

## 2021-03-17 DIAGNOSIS — J449 Chronic obstructive pulmonary disease, unspecified: Secondary | ICD-10-CM | POA: Diagnosis not present

## 2021-03-17 DIAGNOSIS — I509 Heart failure, unspecified: Secondary | ICD-10-CM | POA: Diagnosis not present

## 2021-03-17 LAB — MAGNESIUM: Magnesium: 2.2 mg/dL (ref 1.7–2.4)

## 2021-03-17 LAB — BASIC METABOLIC PANEL
Anion gap: 8 (ref 5–15)
BUN: 34 mg/dL — ABNORMAL HIGH (ref 8–23)
CO2: 29 mmol/L (ref 22–32)
Calcium: 9 mg/dL (ref 8.9–10.3)
Chloride: 103 mmol/L (ref 98–111)
Creatinine, Ser: 1.1 mg/dL (ref 0.61–1.24)
GFR, Estimated: >60 mL/min (ref >60)
Glucose, Bld: 184 mg/dL — ABNORMAL HIGH (ref 70–99)
Potassium: 4.1 mmol/L (ref 3.5–5.1)
Sodium: 140 mmol/L (ref 135–145)

## 2021-03-17 LAB — CBC
HCT: 35 % — ABNORMAL LOW (ref 39.0–52.0)
Hemoglobin: 11.2 g/dL — ABNORMAL LOW (ref 13.0–17.0)
MCH: 27.7 pg (ref 26.0–34.0)
MCHC: 32 g/dL (ref 30.0–36.0)
MCV: 86.4 fL (ref 80.0–100.0)
Platelets: 379 10*3/uL (ref 150–400)
RBC: 4.05 MIL/uL — ABNORMAL LOW (ref 4.22–5.81)
RDW: 24.5 % — ABNORMAL HIGH (ref 11.5–15.5)
WBC: 9.4 10*3/uL (ref 4.0–10.5)
nRBC: 0 % (ref 0.0–0.2)

## 2021-03-17 LAB — GLUCOSE, CAPILLARY
Glucose-Capillary: 179 mg/dL — ABNORMAL HIGH (ref 70–99)
Glucose-Capillary: 208 mg/dL — ABNORMAL HIGH (ref 70–99)
Glucose-Capillary: 261 mg/dL — ABNORMAL HIGH (ref 70–99)
Glucose-Capillary: 210 mg/dL — ABNORMAL HIGH (ref 70–99)

## 2021-03-17 MED ORDER — PREDNISONE 20 MG PO TABS
40.0000 mg | ORAL_TABLET | Freq: Every day | ORAL | Status: DC
  Administered 2021-03-18: 40 mg via ORAL
  Filled 2021-03-17: qty 2

## 2021-03-17 MED ORDER — FUROSEMIDE 40 MG PO TABS
40.0000 mg | ORAL_TABLET | Freq: Two times a day (BID) | ORAL | Status: DC
  Administered 2021-03-17 – 2021-03-18 (×3): 40 mg via ORAL
  Filled 2021-03-17 (×3): qty 1

## 2021-03-17 NOTE — Progress Notes (Signed)
PROGRESS NOTE    Borden Thune  JSE:[id number] DOB: 07/24/1946 DOA: 03/13/2021 PCP: Pcp, No   Brief Narrative:  75 y.o.malewith medical history significant ofhypertension, CADs/pstent in 01/19/2021, COPD, chronic respiratory failure with hypoxia on 4.5L of oxygen, PVD,  DM type II and recent hospitalizations for acute on chronic respiratory failure secondary to heart failure/COPD exacerbation with recent hospitalization being from 02/19/2021-02/24/2021 presented with worsening shortness of breath.  Oxygen saturations were noted to be in the 60s to 70s when [facility] fire department arrived.  He was placed on nonrebreather and given Solu-Medrol by EMS.  In ED, he was put on BiPAP.  BNP was 110.5 with high-sensitivity troponin of 65.  Chest x-ray showed interstitial edema with small bilateral pleural effusions suggestive of congestive heart failure.  Influenza and COVID-19 test were negative.  He was subsequently given intravenous Lasix, now changed to PO   Assessment & Plan:   Principal Problem:   Acute on chronic respiratory failure with hypoxemia ([place]) Active Problems:   Essential hypertension   Type 2 diabetes mellitus (HCC)   CAD (coronary artery disease)   GERD (gastroesophageal reflux disease)   COPD without exacerbation (HCC)   Elevated troponin   Acute on chronic respiratory failure with hypoxia (HCC)   Prolonged QT interval   Acute on chronic congestive heart failure ([place])   Palliative care by specialist   Goals of care, counseling/discussion   Acute on Chronic hypoxic respiratory failure -improving o2. This morning on 5L .  At home he is on 4.5 L nasal cannula.  This is multifactorial in nature combination of fluid overload and COPD exacerbation.  It appears patient is noncompliant with his dietary intake.  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 Improving O2 -Cardiology team following -Change lasix  40mg  po bid.  -Fluid restriction  Acute COPD exacerbation -Transition solumedrol to Prednisone 40mg  po daily x 5 days  -Bronchodilators -I-S/flutter valve  History of CAD status post PCI -Currently chest pain-free.  Continue aspirin Plavix and statin. Continue Bisoprolol.   Diabetes mellitus type 2 with hyperglycemia. Steroid induced  -Holding off on Metformin and Jardiance -Continue 70/30 insulin 25 units.  Continue sliding scale and Accu-Chek  Hyperlipidemia -Statin  Paroxysmal atrial tachycardia -Resolved.  Anemia of chronic disease -Hemoglobin at baseline  Medication noncompliance -Unfortunately patient is noncompliant with his medication and dietary intake leading to recurrent hospitalizations.  Seen by Palliative care service.   PT/OT= SNF    DVT prophylaxis: Lovenox Code Status: DNR Family Communication:  Mardene Celeste updated.   Status is: Inpatient  Remains inpatient appropriate because:IV treatments appropriate due to intensity of illness or inability to take PO   Dispo: The patient is from: Home              Anticipated d/c is to: SNF              Patient currently is not medically stable to d/c. Still hypoxic improving. Changing his meds to PO, if remains stable SNF tomorrow.    Difficult to place patient No       Body mass index is 22.78 kg/m.  Pressure Injury 01/08/21 Ear Left Stage 2 -  Partial thickness loss of dermis presenting as a shallow open injury with a red, pink wound bed without slough. Under Oxygen Tubing from Home O2 (Active)  01/08/21 1800  Location: Ear  Location Orientation: Left  Staging: Stage 2 -  Partial thickness loss of dermis presenting as a  shallow open injury with a red, pink wound bed without slough.  Wound Description (Comments): Under Oxygen Tubing from Home O2  Present on Admission: Yes      Subjective: Sitting up in the chair, doing much better today. Doing well with incentive spirometry.  Review of  Systems Otherwise negative except as per HPI, including: General = no fevers, chills, dizziness,  fatigue HEENT/EYES = negative for loss of vision, double vision, blurred vision,  sore throa Cardiovascular= negative for chest pain, palpitation Respiratory/lungs= negative for shortness of breath, cough, wheezing; hemoptysis,  Gastrointestinal= negative for nausea, vomiting, abdominal pain Genitourinary= negative for Dysuria MSK = Negative for arthralgia, myalgias Neurology= Negative for headache, numbness, tingling  Psychiatry= Negative for suicidal and homocidal ideation Skin= Negative for Rash   Examination: Constitutional: Not in acute distress; 4.5L Respiratory: bibasilar crackles.  Cardiovascular: Normal sinus rhythm, no rubs Abdomen: Nontender nondistended good bowel sounds Musculoskeletal: No edema noted Skin: No rashes seen Neurologic: CN 2-12 grossly intact.  And nonfocal Psychiatric: Normal judgment and insight. Alert and oriented x 3. Normal mood.    Objective: Vitals:   03/16/21 1918 03/16/21 2025 03/17/21 0000 03/17/21 0803  BP:  119/63 (!) 153/63 (!) 151/80  Pulse:  89  95  Resp:  20 19 20   Temp:  98.5 F (36.9 C) (!) 97.5 F (36.4 C) 97.6 F (36.4 C)  TempSrc:  Oral Oral Oral  SpO2: 100% 93% 95% 95%  Weight:      Height:        Intake/Output Summary (Last 24 hours) at 03/17/2021 0914 Last data filed at 03/17/2021 0700 Gross per 24 hour  Intake --  Output 3500 ml  Net -3500 ml   Filed Weights   03/13/21 1223 03/16/21 0500  Weight: 78.1 kg 76.2 kg     Data Reviewed:   CBC: Recent Labs  Lab 03/13/21 1241 03/15/21 0258 03/16/21 0308 03/17/21 0322  WBC 6.9 8.1 9.3 9.4  NEUTROABS 5.0 7.2  --   --   HGB 11.2* 10.2* 10.8* 11.2*  HCT 37.3* 31.7* 33.5* 35.0*  MCV 89.7 85.4 85.9 86.4  PLT 315 319 373 151   Basic Metabolic Panel: Recent Labs  Lab 03/13/21 1241 03/14/21 0249 03/15/21 0258 03/16/21 0308 03/17/21 0322  NA 139 134* 136 136 140   K 3.6 4.0 3.7 3.1* 4.1  CL 103 98 101 99 103  CO2 25 23 27 27 29   GLUCOSE 86 296* 258* 259* 184*  BUN 19 26* 29* 39* 34*  CREATININE 1.46* 1.51* 1.46* 1.11 1.10  CALCIUM 8.9 8.6* 8.6* 8.9 9.0  MG  --  1.8 2.1 2.2 2.2   GFR: Estimated Creatinine Clearance: 62.5 mL/min (by C-G formula based on SCr of 1.1 mg/dL). Liver Function Tests: Recent Labs  Lab 03/13/21 1241  AST 19  ALT 14  ALKPHOS 59  BILITOT 1.1  PROT 5.8*  ALBUMIN 3.1*   No results for input(s): LIPASE, AMYLASE in the last 168 hours. No results for input(s): AMMONIA in the last 168 hours. Coagulation Profile: No results for input(s): INR, PROTIME in the last 168 hours. Cardiac Enzymes: No results for input(s): CKTOTAL, CKMB, CKMBINDEX, TROPONINI in the last 168 hours. BNP (last 3 results) No results for input(s): PROBNP in the last 8760 hours. HbA1C: Recent Labs    03/15/21 0258  HGBA1C 6.9*   CBG: Recent Labs  Lab 03/16/21 0749 03/16/21 1221 03/16/21 1551 03/16/21 2050 03/17/21 0759  GLUCAP 254* 343* 340* 292* 179*   Lipid  Profile: No results for input(s): CHOL, HDL, LDLCALC, TRIG, CHOLHDL, LDLDIRECT in the last 72 hours. Thyroid Function Tests: No results for input(s): TSH, T4TOTAL, FREET4, T3FREE, THYROIDAB in the last 72 hours. Anemia Panel: No results for input(s): VITAMINB12, FOLATE, FERRITIN, TIBC, IRON, RETICCTPCT in the last 72 hours. Sepsis Labs: No results for input(s): PROCALCITON, LATICACIDVEN in the last 168 hours.  Recent Results (from the past 240 hour(s))  Resp Panel by RT-PCR (Flu A&B, Covid) Nasopharyngeal Swab     Status: None   Collection Time: 03/13/21 12:41 PM   Specimen: Nasopharyngeal Swab; Nasopharyngeal(NP) swabs in vial transport medium  Result Value Ref Range Status   SARS Coronavirus 2 by RT PCR NEGATIVE NEGATIVE Final    Comment: (NOTE) SARS-CoV-2 target nucleic acids are NOT DETECTED.  The SARS-CoV-2 RNA is generally detectable in upper respiratory specimens  during the acute phase of infection. The lowest concentration of SARS-CoV-2 viral copies this assay can detect is 138 copies/mL. A negative result does not preclude SARS-Cov-2 infection and should not be used as the sole basis for treatment or other patient management decisions. A negative result may occur with  improper specimen collection/handling, submission of specimen other than nasopharyngeal swab, presence of viral mutation(s) within the areas targeted by this assay, and inadequate number of viral copies(<138 copies/mL). A negative result must be combined with clinical observations, patient history, and epidemiological information. The expected result is Negative.  Fact Sheet for Patients:  EntrepreneurPulse.com.au  Fact Sheet for Healthcare Providers:  IncredibleEmployment.be  This test is no t yet approved or cleared by the [place] FDA and  has been authorized for detection and/or diagnosis of SARS-CoV-2 by FDA under an Emergency Use Authorization (EUA). This EUA will remain  in effect (meaning this test can be used) for the duration of the COVID-19 declaration under Section 564(b)(1) of the Act, 21 U.S.C.section 360bbb-3(b)(1), unless the authorization is terminated  or revoked sooner.       Influenza A by PCR NEGATIVE NEGATIVE Final   Influenza B by PCR NEGATIVE NEGATIVE Final    Comment: (NOTE) The Xpert Xpress SARS-CoV-2/FLU/RSV plus assay is intended as an aid in the diagnosis of influenza from Nasopharyngeal swab specimens and should not be used as a sole basis for treatment. Nasal washings and aspirates are unacceptable for Xpert Xpress SARS-CoV-2/FLU/RSV testing.  Fact Sheet for Patients: EntrepreneurPulse.com.au  Fact Sheet for Healthcare Providers: IncredibleEmployment.be  This test is not yet approved or cleared by the [place] FDA and has been authorized for detection  and/or diagnosis of SARS-CoV-2 by FDA under an Emergency Use Authorization (EUA). This EUA will remain in effect (meaning this test can be used) for the duration of the COVID-19 declaration under Section 564(b)(1) of the Act, 21 U.S.C. section 360bbb-3(b)(1), unless the authorization is terminated or revoked.  Performed at [facility], [place] [address] [place], [place] [address]          Radiology Studies: No results found.      Scheduled Meds: . aspirin EC  81 mg Oral Daily  . atorvastatin  40 mg Oral QHS  . bisoprolol  5 mg Oral Daily  . clopidogrel  75 mg Oral Daily  . diltiazem  240 mg Oral Daily  . enoxaparin (LOVENOX) injection  40 mg Subcutaneous Q24H  . feeding supplement (GLUCERNA SHAKE)  237 mL Oral BID BM  . furosemide  40 mg Oral BID  . insulin aspart  0-9 Units Subcutaneous TID WC  . insulin  aspart protamine- aspart  25 Units Subcutaneous BID WC  . magnesium oxide  400 mg Oral Daily  . mometasone-formoterol  2 puff Inhalation BID  . [START ON 03/18/2021] predniSONE  40 mg Oral Q breakfast  . senna-docusate  1 tablet Oral Q0600  . sodium chloride flush  3 mL Intravenous Q12H  . umeclidinium bromide  1 puff Inhalation Daily   Continuous Infusions: . sodium chloride       LOS: 4 days   Time spent= 35 mins    Obdulia Steier Arsenio Loader, MD [facility]  If 7PM-7AM, please contact night-coverage  03/17/2021, 9:14 AM

## 2021-03-17 NOTE — Progress Notes (Signed)
Palliative Care Progress Note  HPI: 75 y.o. male  with past medical history of hypertension, CADs/pstent in 01/19/2021, COPD, chronic respiratory failure with hypoxia on 4.5L of oxygen, diastolic CHF, HLD, PVD, and DM type II  admitted on 03/13/2021 with shortness of breath and recurrent acute CHF exaceration.    Patient was recently hospitalized from 2/20 to 2/25 for COPD and CHF exacerbation and questionable CAP. This is the patient's 5th hospitalization in 6 months. Palliative care consulted to provide extra support and assist with determining goals of care.   Medical records reviewed. Discussed with patient's daughter Mardene Celeste via phone and Dr. Reesa Chew at the bedside. Patient is sitting up in the chair, comfortably breathing on [place] 4.5L O2. He denies pain or distress.   Patient was frustrated this morning due to water restrictions and "no professionalism" from staff. This is in great contrast with the praise he was giving yesterday. He is also concerned that he will only have a few days of insurance authorization for rehab. He appears calm and reasonable, understands that orders must be followed and he tries to follow them as well. He has not reviewed the MOST form further as he is waiting to discuss with his daughter in person.   Called daughter and provided an update. She is uncertain whether she will be able to meet at the bedside today as she lives 2 hours away and patient is encouraging her not to miss work. Daughter prefers to visit patient once he is settled at [facility]. It would be very convenient for her if patient discharges tomorrow. Encouraged her to complete pink MOST form as soon as possible, whether inpatient or at her first visit to bedside at [facility].   PMT contact information provided. Encouraged to call with additional needs. PMT will continue to support holistically.   Plan: -Psychosocial and emotional support provided -Received AD/Living Will from daughter via fax. Will scan into  EMR/Vynca. -Encouraged daughter to complete MOST form with patient as soon as she visits, as he is leaning on her for guidance. Offered to assist if she is able to meet before d/c. -Appreciate TOC assistance with outpatient palliative care referral -d/c to SNF with outpatient palliative services today or tomorrow   Total time: 15 minutes  Greater than 50% of time was spent counseling and coordinating care related to the above assessment and plan.  Dorthy Cooler, PA-C Palliative Medicine Team Team phone # [phone]  Thank you for allowing the Palliative Medicine Team to assist in the care of this patient. Please utilize secure chat with additional questions, if there is no response within 30 minutes please call the above phone number.  Palliative Medicine Team providers are available by phone from 7am to 7pm daily and can be reached through the team cell phone.  Should this patient require assistance outside of these hours, please call the patient's attending physician.

## 2021-03-18 DIAGNOSIS — J9621 Acute and chronic respiratory failure with hypoxia: Secondary | ICD-10-CM | POA: Diagnosis not present

## 2021-03-18 LAB — CBC
HCT: 34.5 % — ABNORMAL LOW (ref 39.0–52.0)
Hemoglobin: 11.1 g/dL — ABNORMAL LOW (ref 13.0–17.0)
MCH: 27.5 pg (ref 26.0–34.0)
MCHC: 32.2 g/dL (ref 30.0–36.0)
MCV: 85.4 fL (ref 80.0–100.0)
Platelets: 380 10*3/uL (ref 150–400)
RBC: 4.04 MIL/uL — ABNORMAL LOW (ref 4.22–5.81)
RDW: 24.6 % — ABNORMAL HIGH (ref 11.5–15.5)
WBC: 8.8 10*3/uL (ref 4.0–10.5)
nRBC: 0 % (ref 0.0–0.2)

## 2021-03-18 LAB — BASIC METABOLIC PANEL
Anion gap: 8 (ref 5–15)
BUN: 36 mg/dL — ABNORMAL HIGH (ref 8–23)
CO2: 29 mmol/L (ref 22–32)
Calcium: 8.6 mg/dL — ABNORMAL LOW (ref 8.9–10.3)
Chloride: 100 mmol/L (ref 98–111)
Creatinine, Ser: 1.08 mg/dL (ref 0.61–1.24)
GFR, Estimated: >60 mL/min (ref >60)
Glucose, Bld: 185 mg/dL — ABNORMAL HIGH (ref 70–99)
Potassium: 3.6 mmol/L (ref 3.5–5.1)
Sodium: 137 mmol/L (ref 135–145)

## 2021-03-18 LAB — GLUCOSE, CAPILLARY
Glucose-Capillary: 134 mg/dL — ABNORMAL HIGH (ref 70–99)
Glucose-Capillary: 238 mg/dL — ABNORMAL HIGH (ref 70–99)
Glucose-Capillary: 441 mg/dL — ABNORMAL HIGH (ref 70–99)

## 2021-03-18 LAB — MAGNESIUM: Magnesium: 2.1 mg/dL (ref 1.7–2.4)

## 2021-03-18 LAB — SARS CORONAVIRUS 2 (TAT 6-24 HRS): SARS Coronavirus 2: NEGATIVE

## 2021-03-18 MED ORDER — PREDNISONE 20 MG PO TABS: 40.0000 mg | ORAL_TABLET | Freq: Every day | ORAL | 0 refills | Status: AC

## 2021-03-18 MED ORDER — POTASSIUM CHLORIDE CRYS ER 20 MEQ PO TBCR
40.0000 meq | EXTENDED_RELEASE_TABLET | Freq: Once | ORAL | Status: AC
  Administered 2021-03-18: 40 meq via ORAL
  Filled 2021-03-18: qty 2

## 2021-03-18 NOTE — Progress Notes (Signed)
Discharge note:  Discharge order received pending negative Covid test. Covid test came back negative this afternoon and [facility] arranged for transport. Report called to [employer] and given to [employer]. Pt stable on 4L O2 [place] on discharge.

## 2021-03-18 NOTE — Discharge Summary (Signed)
Physician Discharge Summary  Brandon [name] Brandon [name] DOB: [birth year] DOA: 03/13/2021  PCP: Pcp, No  Admit date: 03/13/2021 Discharge date: 03/18/2021  Admitted From: Home Disposition:  SNF  Recommendations for Outpatient Follow-up:  1. Follow up with PCP in 1-2 weeks 2. Please obtain BMP/CBC in` 3 days 3. Continue home supplemental oxygen 4.5 L daily 4. Complete oral prednisone course 5. Resume home Lasix.  Closely monitor his volume status.   Discharge Condition: Stable CODE STATUS: DNR Diet recommendation: cardiac diet   Brief/Interim Summary:74 y.o.malewith medical history significant ofhypertension, CADs/pstent in 01/19/2021, COPD, chronic respiratory failure with hypoxia on 4.5L of oxygen, PVD, DM type II and recent hospitalizations for acute on chronic respiratory failure secondary to heart failure/COPD exacerbation with recent hospitalization being from 02/19/2021-02/24/2021 presented with worsening shortness of breath. Oxygen saturations were noted to be in the 60s to 70s when [facility] fire department arrived. He was placed on nonrebreather and given Solu-Medrol by EMS. In ED, he was put on BiPAP. BNP was 110.5 with high-sensitivity troponin of 65. Chest x-ray showed interstitial edema with small bilateral pleural effusions suggestive of congestive heart failure. Influenza and COVID-19 test were negative. He was subsequently given intravenous Lasix, now changed to PO   Assessment & Plan:   Principal Problem:   Acute on chronic respiratory failure with hypoxemia ([place]) Active Problems:   Essential hypertension   Type 2 diabetes mellitus (HCC)   CAD (coronary artery disease)   GERD (gastroesophageal reflux disease)   COPD without exacerbation (HCC)   Elevated troponin   Acute on chronic respiratory failure with hypoxia (HCC)   Prolonged QT interval   Acute on chronic congestive heart failure ([place])   Palliative care by specialist   Goals of care,  counseling/discussion   Acute on Chronic hypoxic respiratory failure- resolved at baseline.  -improving o2. This morning on 4L .  At home he is on 4.5 L nasal cannula.  This is multifactorial in nature combination of fluid overload and COPD exacerbation.  It appears patient is noncompliant with his dietary intake.  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 Improving O2 -Cardiology team following -Change lasix 40mg  po bid.  Home meds.  -Fluid restriction  Acute COPD exacerbation -Transition solumedrol to Prednisone 40mg  po daily x 5 days; 4 more days left. Last day 3/22 -Bronchodilators -I-S/flutter valve  History of CAD status post PCI -Currently chest pain-free.  Continue aspirin Plavix and statin. Continue Bisoprolol.   Diabetes mellitus type 2 with hyperglycemia. Steroid induced  -Resume home regimen.   Hyperlipidemia -Statin  Paroxysmal atrial tachycardia -Resolved.  Anemia of chronic disease -Hemoglobin at baseline  Medication noncompliance -Unfortunately patient is noncompliant with his medication and dietary intake leading to recurrent hospitalizations.  Seen by Palliative care service.   Brandon [name]/OT= SNF   Body mass index is 22.78 kg/m.  Pressure Injury 01/08/21 Ear Left Stage 2 -  Partial thickness loss of dermis presenting as a shallow open injury with a red, pink wound bed without slough. Under Oxygen Tubing from Home O2 (Active)  01/08/21 1800  Location: Ear  Location Orientation: Left  Staging: Stage 2 -  Partial thickness loss of dermis presenting as a shallow open injury with a red, pink wound bed without slough.  Wound Description (Comments): Under Oxygen Tubing from Home O2  Present on Admission: Yes        Discharge Diagnoses:  Principal Problem:   Acute on chronic respiratory failure with hypoxemia (HCC) Active  Problems:   Essential hypertension   Type 2 diabetes  mellitus (HCC)   CAD (coronary artery disease)   GERD (gastroesophageal reflux disease)   COPD without exacerbation (HCC)   Elevated troponin   Acute on chronic respiratory failure with hypoxia (HCC)   Prolonged QT interval   Acute on chronic congestive heart failure [facility])   Palliative care by specialist   Advance care planning    Subjective: Feels great, excited for rehab.   Discharge Exam: Vitals:   03/18/21 0559 03/18/21 0756  BP: 135/80   Pulse: 71   Resp: 16   Temp: 97.8 F (36.6 C)   SpO2: 93% 95%   Vitals:   03/17/21 1534 03/17/21 2139 03/18/21 0559 03/18/21 0756  BP: 127/66 113/72 135/80   Pulse: 74 66 71   Resp: 17 16 16    Temp: 98.1 F (36.7 C) 98 F (36.7 C) 97.8 F (36.6 C)   TempSrc: Oral     SpO2:  97% 93% 95%  Weight:      Height:        General: Brandon [name] is alert, awake, not in acute distress, 4.5L [place] Cardiovascular: RRR, S1/S2 +, no rubs, no gallops Respiratory: CTA bilaterally, no wheezing, no rhonchi Abdominal: Soft, NT, ND, bowel sounds + Extremities: no edema, no cyanosis  Discharge Instructions   Allergies as of 03/18/2021      Reactions   Hydrochlorothiazide Other (See Comments)   Hypokalemia (patient disputes this in 2022)      Medication List    TAKE these medications   albuterol 108 (90 Base) MCG/ACT inhaler Commonly known as: VENTOLIN HFA Inhale 2 puffs into the lungs every 4 (four) hours as needed for wheezing or shortness of breath.   aspirin 81 MG EC tablet Take 1 tablet (81 mg total) by mouth daily.   atorvastatin 40 MG tablet Commonly known as: LIPITOR Take 40 mg by mouth at bedtime.   bisoprolol 5 MG tablet Commonly known as: ZEBETA Take 1 tablet (5 mg total) by mouth daily.   clopidogrel 75 MG tablet Commonly known as: PLAVIX Take 1 tablet (75 mg total) by mouth daily.   diltiazem 240 MG 24 hr capsule Commonly known as: CARDIZEM CD Take 1 capsule (240 mg total) by mouth daily.   empagliflozin 10 MG Tabs  tablet Commonly known as: Jardiance Take 1 tablet (10 mg total) by mouth daily before breakfast.   feeding supplement (GLUCERNA SHAKE) Liqd Take 237 mLs by mouth 2 (two) times daily between meals.   Fluticasone-Salmeterol 250-50 MCG/DOSE Aepb Commonly known as: ADVAIR Inhale 1 puff into the lungs 2 (two) times daily.   furosemide 40 MG tablet Commonly known as: LASIX Take 1 tablet (40 mg total) by mouth 2 (two) times daily.   insulin aspart protamine - aspart (70-30) 100 UNIT/ML FlexPen Commonly known as: NOVOLOG 70/30 MIX Inject 0.2 mLs (20 Units total) into the skin 2 (two) times daily with a meal.   Magnesium Oxide 420 MG Tabs Take 420 mg by mouth daily.   metFORMIN 1000 MG tablet Commonly known as: GLUCOPHAGE Take by mouth 2 (two) times daily with a meal.   multivitamin with minerals Tabs tablet Take 1 tablet by mouth daily.   naproxen sodium 220 MG tablet Commonly known as: ALEVE Take 440 mg by mouth daily as needed (pain).   nortriptyline 50 MG capsule Commonly known as: PAMELOR Take 50 mg by mouth at bedtime. For restless leg symdrome   Omega-3 1000 MG Caps Take  1,000 mg by mouth in the morning and at bedtime.   OXYGEN Inhale 4.5 L/min into the lungs continuous.   pantoprazole 40 MG tablet Commonly known as: PROTONIX Take 1 tablet (40 mg total) by mouth daily. What changed: when to take this   polyethylene glycol 17 g packet Commonly known as: MIRALAX / GLYCOLAX Take 17 g by mouth daily as needed (constipation). Mix with water or other liquid as instructed before drinking   potassium chloride 10 MEQ tablet Commonly known as: KLOR-CON Take 2 tablets (20 mEq total) by mouth daily. What changed: how much to take   predniSONE 20 MG tablet Commonly known as: DELTASONE Take 2 tablets (40 mg total) by mouth daily with breakfast for 4 days. Start taking on: March 19, 2021   senna-docusate 8.6-50 MG tablet Commonly known as: Senokot-S Take 1 tablet by  mouth at bedtime as needed for mild constipation. What changed: when to take this   Tiotropium Bromide Monohydrate 2.5 MCG/ACT Aers Inhale 2 puffs into the lungs daily.       Contact information for after-discharge care    Destination    [facility] Preferred SNF .   Service: Skilled Nursing Contact information: [address] [address] [phone]                 Allergies  Allergen Reactions  . Hydrochlorothiazide Other (See Comments)    Hypokalemia (patient disputes this in 2022)    You were cared for by a hospitalist during your hospital stay. If you have any questions about your discharge medications or the care you received while you were in the hospital after you are discharged, you can call the unit and asked to speak with the hospitalist on call if the hospitalist that took care of you is not available. Once you are discharged, your primary care physician will handle any further medical issues. Please note that no refills for any discharge medications will be authorized once you are discharged, as it is imperative that you return to your primary care physician (or establish a relationship with a primary care physician if you do not have one) for your aftercare needs so that they can reassess your need for medications and monitor your lab values.   Procedures/Studies: DG Chest Portable 1 View  Result Date: 03/13/2021 CLINICAL DATA:  Shortness of breath. EXAM: PORTABLE CHEST 1 VIEW COMPARISON:  02/21/2021 and CT chest 1022. FINDINGS: Patient is slightly rotated. Trachea is midline. Heart is enlarged. Thoracic aorta is calcified. Mid and lower lung zone predominant mixed interstitial and airspace opacification with small bilateral pleural effusions. IMPRESSION: Congestive heart failure. Electronically Signed   By: Lorin Picket M.D.   On: 03/13/2021 12:42   DG CHEST PORT 1 VIEW  Result Date: 02/21/2021 CLINICAL DATA:  Dyspnea.  EXAM: PORTABLE CHEST 1 VIEW COMPARISON:  February 19, 2021. FINDINGS: Stable cardiomediastinal silhouette. No pneumothorax is noted. Bilateral coarse lung opacities are noted concerning for multifocal pneumonia or possibly edema. Small pleural effusions may be present. Bony thorax is unremarkable. IMPRESSION: Bilateral coarse lung opacities are noted concerning for multifocal pneumonia or possibly edema. Small pleural effusions may be present. Electronically Signed   By: Marijo Conception M.D.   On: 02/21/2021 08:16   DG Chest Port 1 View  Result Date: 02/19/2021 CLINICAL DATA:  Shortness of breath. EXAM: PORTABLE CHEST 1 VIEW COMPARISON:  02/01/2021 FINDINGS: Heart size and mediastinal contours are normal. Aortic atherosclerosis. Small bilateral pleural effusions, unchanged.  Mild diffuse increase interstitial markings identified compatible with interstitial edema. More focal airspace disease within the right midlung is identified which may represent an area of pneumonia or asymmetric edema. IMPRESSION: 1. CHF pattern. 2. More focal airspace disease within the right midlung may represent pneumonia or asymmetric edema. Electronically Signed   By: Kerby Moors M.D.   On: 02/19/2021 09:06   ECHOCARDIOGRAM COMPLETE  Result Date: 02/21/2021    ECHOCARDIOGRAM REPORT   Patient Name:   Brandon [name] Date of Exam: 02/21/2021 Medical Rec #:  [id number]     Height:       78.0 in Accession #:    [id number]    Weight:       168.7 lb Date of Birth:  May 30, 1946     BSA:          2.100 m Patient Age:    59 years      BP:           108/69 mmHg Patient Gender: M             HR:           81 bpm. Exam Location:  Inpatient Procedure: 2D Echo, Cardiac Doppler and Color Doppler Indications:    CHF-Acute Diastolic E56.31  History:        Patient has prior history of Echocardiogram examinations, most                 recent 01/09/2021. CAD, COPD and PAD, Arrythmias:Atrial                 tachycardia; Risk Factors:Diabetes and  Hypertension. GERD.  Sonographer:    Darlina Sicilian RDCS Referring Phys: [id number] [name]  1. Left ventricular ejection fraction, by estimation, is 60 to 65%. The left ventricle has normal function. The left ventricle has no regional wall motion abnormalities. There is mild concentric left ventricular hypertrophy. Left ventricular diastolic parameters are consistent with Grade I diastolic dysfunction (impaired relaxation).  2. Right ventricular systolic function is mildly reduced. The right ventricular size is moderately enlarged. There is moderately elevated pulmonary artery systolic pressure.  3. Left atrial size was mild to moderately dilated.  4. Right atrial size was mildly dilated.  5. The mitral valve is degenerative. Trivial mitral valve regurgitation. No evidence of mitral stenosis.  6. The aortic valve is tricuspid. There is moderate calcification of the aortic valve. There is moderate thickening of the aortic valve. Aortic valve regurgitation is mild. Mild to moderate aortic valve sclerosis/calcification is present, without any evidence of aortic stenosis.  7. Aortic dilatation noted. There is moderate dilatation of the ascending aorta, measuring 49 mm.  8. The inferior vena cava is dilated in size with <50% respiratory variability, suggesting right atrial pressure of 15 mmHg. Comparison(s): Changes from prior study are noted. Ascending aorta measures 4.9 cm in this study. May be oblique angle, recommend monitoring. Conclusion(s)/Recommendation(s): Otherwise normal echocardiogram, with minor abnormalities described in the report. FINDINGS  Left Ventricle: Left ventricular ejection fraction, by estimation, is 60 to 65%. The left ventricle has normal function. The left ventricle has no regional wall motion abnormalities. The left ventricular internal cavity size was normal in size. There is  mild concentric left ventricular hypertrophy. Left ventricular diastolic parameters are consistent  with Grade I diastolic dysfunction (impaired relaxation). Right Ventricle: The right ventricular size is moderately enlarged. Right vetricular wall thickness was not well visualized. Right ventricular systolic function is mildly reduced. There is moderately elevated  pulmonary artery systolic pressure. The tricuspid regurgitant velocity is 3.01 m/s, and with an assumed right atrial pressure of 15 mmHg, the estimated right ventricular systolic pressure is 27.7 mmHg. Left Atrium: Left atrial size was mild to moderately dilated. Right Atrium: Right atrial size was mildly dilated. Pericardium: There is no evidence of pericardial effusion. Mitral Valve: The mitral valve is degenerative in appearance. There is mild thickening of the mitral valve leaflet(s). There is mild calcification of the mitral valve leaflet(s). Mild to moderate mitral annular calcification. Trivial mitral valve regurgitation. No evidence of mitral valve stenosis. Tricuspid Valve: The tricuspid valve is normal in structure. Tricuspid valve regurgitation is mild . No evidence of tricuspid stenosis. Aortic Valve: The aortic valve is tricuspid. There is moderate calcification of the aortic valve. There is moderate thickening of the aortic valve. Aortic valve regurgitation is mild. Mild to moderate aortic valve sclerosis/calcification is present, without any evidence of aortic stenosis. Pulmonic Valve: The pulmonic valve was not well visualized. Pulmonic valve regurgitation is trivial. No evidence of pulmonic stenosis. Aorta: Aortic dilatation noted. There is moderate dilatation of the ascending aorta, measuring 49 mm. Venous: The inferior vena cava is dilated in size with less than 50% respiratory variability, suggesting right atrial pressure of 15 mmHg. IAS/Shunts: The atrial septum is grossly normal.  LEFT VENTRICLE PLAX 2D LVIDd:         4.40 cm  Diastology LVIDs:         3.30 cm  LV e' medial:    4.54 cm/s LV PW:         0.70 cm  LV E/e' medial:  12.3  LV IVS:        1.30 cm  LV e' lateral:   5.43 cm/s LVOT diam:     2.30 cm  LV E/e' lateral: 10.3 LV SV:         56 LV SV Index:   27 LVOT Area:     4.15 cm  RIGHT VENTRICLE RV S prime:     13.00 cm/s TAPSE (M-mode): 1.6 cm LEFT ATRIUM           Index       RIGHT ATRIUM           Index LA diam:      3.10 cm 1.48 cm/m  RA Area:     20.00 cm LA Vol (A2C): 51.7 ml 24.62 ml/m RA Volume:   53.60 ml  25.53 ml/m LA Vol (A4C): 71.2 ml 33.91 ml/m  AORTIC VALVE LVOT Vmax:   73.10 cm/s LVOT Vmean:  42.200 cm/s LVOT VTI:    0.135 m  AORTA Ao Root diam: 4.10 cm Ao Asc diam:  4.70 cm MITRAL VALVE               TRICUSPID VALVE MV Area (PHT): 2.95 cm    TR Peak grad:   36.2 mmHg MV Decel Time: 257 msec    TR Vmax:        301.00 cm/s MV E velocity: 55.70 cm/s MV A velocity: 81.80 cm/s  SHUNTS MV E/A ratio:  0.68        Systemic VTI:  0.14 m                            Systemic Diam: 2.30 cm Buford Dresser MD Electronically signed by Buford Dresser MD Signature Date/Time: 02/21/2021/6:22:23 PM    Final       The results  of significant diagnostics from this hospitalization (including imaging, microbiology, ancillary and laboratory) are listed below for reference.     Microbiology: Recent Results (from the past 240 hour(s))  Resp Panel by RT-PCR (Flu A&B, Covid) Nasopharyngeal Swab     Status: None   Collection Time: 03/13/21 12:41 PM   Specimen: Nasopharyngeal Swab; Nasopharyngeal(NP) swabs in vial transport medium  Result Value Ref Range Status   SARS Coronavirus 2 by RT PCR NEGATIVE NEGATIVE Final    Comment: (NOTE) SARS-CoV-2 target nucleic acids are NOT DETECTED.  The SARS-CoV-2 RNA is generally detectable in upper respiratory specimens during the acute phase of infection. The lowest concentration of SARS-CoV-2 viral copies this assay can detect is 138 copies/mL. A negative result does not preclude SARS-Cov-2 infection and should not be used as the sole basis for treatment or other patient  management decisions. A negative result may occur with  improper specimen collection/handling, submission of specimen other than nasopharyngeal swab, presence of viral mutation(s) within the areas targeted by this assay, and inadequate number of viral copies(<138 copies/mL). A negative result must be combined with clinical observations, patient history, and epidemiological information. The expected result is Negative.  Fact Sheet for Patients:  EntrepreneurPulse.com.au  Fact Sheet for Healthcare Providers:  IncredibleEmployment.be  This test is no t yet approved or cleared by the [place] FDA and  has been authorized for detection and/or diagnosis of SARS-CoV-2 by FDA under an Emergency Use Authorization (EUA). This EUA will remain  in effect (meaning this test can be used) for the duration of the COVID-19 declaration under Section 564(b)(1) of the Act, 21 U.S.C.section 360bbb-3(b)(1), unless the authorization is terminated  or revoked sooner.       Influenza A by PCR NEGATIVE NEGATIVE Final   Influenza B by PCR NEGATIVE NEGATIVE Final    Comment: (NOTE) The Xpert Xpress SARS-CoV-2/FLU/RSV plus assay is intended as an aid in the diagnosis of influenza from Nasopharyngeal swab specimens and should not be used as a sole basis for treatment. Nasal washings and aspirates are unacceptable for Xpert Xpress SARS-CoV-2/FLU/RSV testing.  Fact Sheet for Patients: EntrepreneurPulse.com.au  Fact Sheet for Healthcare Providers: IncredibleEmployment.be  This test is not yet approved or cleared by the [place] FDA and has been authorized for detection and/or diagnosis of SARS-CoV-2 by FDA under an Emergency Use Authorization (EUA). This EUA will remain in effect (meaning this test can be used) for the duration of the COVID-19 declaration under Section 564(b)(1) of the Act, 21 U.S.C. section 360bbb-3(b)(1),  unless the authorization is terminated or revoked.  Performed at [facility], [place] [address] [place], [place] [address]      Labs: BNP (last 3 results) Recent Labs    02/19/21 0833 03/13/21 1241 03/16/21 0308  BNP 159.7* 110.5* 347.4*   Basic Metabolic Panel: Recent Labs  Lab 03/14/21 0249 03/15/21 0258 03/16/21 0308 03/17/21 0322 03/18/21 0204  NA 134* 136 136 140 137  K 4.0 3.7 3.1* 4.1 3.6  CL 98 101 99 103 100  CO2 23 27 27 29 29   GLUCOSE 296* 258* 259* 184* 185*  BUN 26* 29* 39* 34* 36*  CREATININE 1.51* 1.46* 1.11 1.10 1.08  CALCIUM 8.6* 8.6* 8.9 9.0 8.6*  MG 1.8 2.1 2.2 2.2 2.1   Liver Function Tests: Recent Labs  Lab 03/13/21 1241  AST 19  ALT 14  ALKPHOS 59  BILITOT 1.1  PROT 5.8*  ALBUMIN 3.1*   No results for input(s): LIPASE, AMYLASE in  the last 168 hours. No results for input(s): AMMONIA in the last 168 hours. CBC: Recent Labs  Lab 03/13/21 1241 03/15/21 0258 03/16/21 0308 03/17/21 0322 03/18/21 0204  WBC 6.9 8.1 9.3 9.4 8.8  NEUTROABS 5.0 7.2  --   --   --   HGB 11.2* 10.2* 10.8* 11.2* 11.1*  HCT 37.3* 31.7* 33.5* 35.0* 34.5*  MCV 89.7 85.4 85.9 86.4 85.4  PLT 315 319 373 379 380   Cardiac Enzymes: No results for input(s): CKTOTAL, CKMB, CKMBINDEX, TROPONINI in the last 168 hours. BNP: Invalid input(s): POCBNP CBG: Recent Labs  Lab 03/17/21 0759 03/17/21 1222 03/17/21 1533 03/17/21 2140 03/18/21 0754  GLUCAP 179* 208* 261* 210* 134*   D-Dimer No results for input(s): DDIMER in the last 72 hours. Hgb A1c No results for input(s): HGBA1C in the last 72 hours. Lipid Profile No results for input(s): CHOL, HDL, LDLCALC, TRIG, CHOLHDL, LDLDIRECT in the last 72 hours. Thyroid function studies No results for input(s): TSH, T4TOTAL, T3FREE, THYROIDAB in the last 72 hours.  Invalid input(s): FREET3 Anemia work up No results for input(s): VITAMINB12, FOLATE, FERRITIN, TIBC, IRON, RETICCTPCT in the last 72  hours. Urinalysis    Component Value Date/Time   COLORURINE YELLOW 01/07/2021 2010   APPEARANCEUR CLEAR 01/07/2021 2010   LABSPEC 1.006 01/07/2021 2010   PHURINE 5.0 01/07/2021 2010   GLUCOSEU NEGATIVE 01/07/2021 2010   HGBUR NEGATIVE 01/07/2021 2010   [place] NEGATIVE 01/07/2021 2010   [place] NEGATIVE 01/07/2021 2010   PROTEINUR NEGATIVE 01/07/2021 2010   NITRITE NEGATIVE 01/07/2021 2010   LEUKOCYTESUR NEGATIVE 01/07/2021 2010   Sepsis Labs Invalid input(s): PROCALCITONIN,  WBC,  LACTICIDVEN Microbiology Recent Results (from the past 240 hour(s))  Resp Panel by RT-PCR (Flu A&B, Covid) Nasopharyngeal Swab     Status: None   Collection Time: 03/13/21 12:41 PM   Specimen: Nasopharyngeal Swab; Nasopharyngeal(NP) swabs in vial transport medium  Result Value Ref Range Status   SARS Coronavirus 2 by RT PCR NEGATIVE NEGATIVE Final    Comment: (NOTE) SARS-CoV-2 target nucleic acids are NOT DETECTED.  The SARS-CoV-2 RNA is generally detectable in upper respiratory specimens during the acute phase of infection. The lowest concentration of SARS-CoV-2 viral copies this assay can detect is 138 copies/mL. A negative result does not preclude SARS-Cov-2 infection and should not be used as the sole basis for treatment or other patient management decisions. A negative result may occur with  improper specimen collection/handling, submission of specimen other than nasopharyngeal swab, presence of viral mutation(s) within the areas targeted by this assay, and inadequate number of viral copies(<138 copies/mL). A negative result must be combined with clinical observations, patient history, and epidemiological information. The expected result is Negative.  Fact Sheet for Patients:  EntrepreneurPulse.com.au  Fact Sheet for Healthcare Providers:  IncredibleEmployment.be  This test is no t yet approved or cleared by the [place] FDA and  has been  authorized for detection and/or diagnosis of SARS-CoV-2 by FDA under an Emergency Use Authorization (EUA). This EUA will remain  in effect (meaning this test can be used) for the duration of the COVID-19 declaration under Section 564(b)(1) of the Act, 21 U.S.C.section 360bbb-3(b)(1), unless the authorization is terminated  or revoked sooner.       Influenza A by PCR NEGATIVE NEGATIVE Final   Influenza B by PCR NEGATIVE NEGATIVE Final    Comment: (NOTE) The Xpert Xpress SARS-CoV-2/FLU/RSV plus assay is intended as an aid in the diagnosis of influenza from Nasopharyngeal swab specimens  and should not be used as a sole basis for treatment. Nasal washings and aspirates are unacceptable for Xpert Xpress SARS-CoV-2/FLU/RSV testing.  Fact Sheet for Patients: EntrepreneurPulse.com.au  Fact Sheet for Healthcare Providers: IncredibleEmployment.be  This test is not yet approved or cleared by the [place] FDA and has been authorized for detection and/or diagnosis of SARS-CoV-2 by FDA under an Emergency Use Authorization (EUA). This EUA will remain in effect (meaning this test can be used) for the duration of the COVID-19 declaration under Section 564(b)(1) of the Act, 21 U.S.C. section 360bbb-3(b)(1), unless the authorization is terminated or revoked.  Performed at [facility], [place] [address] [place], [place] [address]      Time coordinating discharge:  I have spent 35 minutes face to face with the patient and on the ward discussing the patients care, assessment, plan and disposition with other care givers. >50% of the time was devoted counseling the patient about the risks and benefits of treatment/Discharge disposition and coordinating care.   SIGNED:   Damita Lack, MD  [facility] 03/18/2021, 10:28 AM   If 7PM-7AM, please contact night-coverage

## 2021-03-18 NOTE — TOC Transition Note (Signed)
Transition of Care [facility]) - CM/SW Discharge Note   Patient Details  Name: Brandon [name] MRN: [id number] Date of Birth: May 13, 1946  Transition of Care [facility]) CM/SW Contact:  Coralee Pesa, [place] Phone Number: 03/18/2021, 3:08 PM   Clinical Narrative:    Pt to be transported to [facility] by [employer].  Nurse to call report to [phone]. Waiting for covid test to result.   Final next level of care: Skilled Nursing Facility Barriers to Discharge: Other (comment) (Pending covid test)   Patient Goals and CMS Choice Patient states their goals for this hospitalization and ongoing recovery are:: "back to my normal life" CMS Medicare.gov Compare Post Acute Care list provided to:: Patient Choice offered to / list presented to : Patient  Discharge Placement              Patient chooses bed at: [facility] Patient to be transferred to facility by: [place] Name of family member notified: Mardene Celeste Patient and family notified of of transfer: 03/18/21  Discharge Plan and Services In-house Referral: Clinical Social Work   Post Acute Care Choice: [place]                               Social Determinants of Health (SDOH) Interventions     Readmission Risk Interventions Readmission Risk Prevention Plan 02/24/2021 02/06/2021  Transportation Screening Complete Complete  PCP or Specialist Appt within 3-5 Days - Complete  HRI or [place] - Complete  Social Work Consult for [place] Planning/Counseling - Complete  Palliative Care Screening - Not Applicable  Medication Review Press photographer) Complete Complete  PCP or Specialist appointment within 3-5 days of discharge (No Data) -  [place] or Home Care Consult Complete -  SW Recovery Care/Counseling Consult Complete -  Palliative Care Screening Not Applicable -  Stamps Not Applicable -  Some recent data might be hidden

## 2021-04-09 ENCOUNTER — Other Ambulatory Visit: Payer: Self-pay

## 2021-04-09 ENCOUNTER — Emergency Department (HOSPITAL_COMMUNITY): Payer: No Typology Code available for payment source

## 2021-04-09 ENCOUNTER — Inpatient Hospital Stay (HOSPITAL_COMMUNITY)
Admission: EM | Admit: 2021-04-09 | Discharge: 2021-04-14 | DRG: 291 | Disposition: A | Discharge status: Home Health | Payer: No Typology Code available for payment source | Attending: Family Medicine | Admitting: Family Medicine

## 2021-04-09 ENCOUNTER — Encounter (HOSPITAL_COMMUNITY): Payer: Self-pay | Admitting: Internal Medicine

## 2021-04-09 DIAGNOSIS — Z87891 Personal history of nicotine dependence: Secondary | ICD-10-CM | POA: Diagnosis not present

## 2021-04-09 DIAGNOSIS — E1151 Type 2 diabetes mellitus with diabetic peripheral angiopathy without gangrene: Secondary | ICD-10-CM | POA: Diagnosis present

## 2021-04-09 DIAGNOSIS — N179 Acute kidney failure, unspecified: Secondary | ICD-10-CM | POA: Diagnosis present

## 2021-04-09 DIAGNOSIS — Z794 Long term (current) use of insulin: Secondary | ICD-10-CM

## 2021-04-09 DIAGNOSIS — I248 Other forms of acute ischemic heart disease: Secondary | ICD-10-CM | POA: Diagnosis present

## 2021-04-09 DIAGNOSIS — I110 Hypertensive heart disease with heart failure: Secondary | ICD-10-CM | POA: Diagnosis present

## 2021-04-09 DIAGNOSIS — K219 Gastro-esophageal reflux disease without esophagitis: Secondary | ICD-10-CM | POA: Diagnosis present

## 2021-04-09 DIAGNOSIS — J9621 Acute and chronic respiratory failure with hypoxia: Secondary | ICD-10-CM | POA: Diagnosis present

## 2021-04-09 DIAGNOSIS — F1020 Alcohol dependence, uncomplicated: Secondary | ICD-10-CM | POA: Diagnosis present

## 2021-04-09 DIAGNOSIS — E872 Acidosis: Secondary | ICD-10-CM | POA: Diagnosis present

## 2021-04-09 DIAGNOSIS — I2510 Atherosclerotic heart disease of native coronary artery without angina pectoris: Secondary | ICD-10-CM | POA: Diagnosis present

## 2021-04-09 DIAGNOSIS — J441 Chronic obstructive pulmonary disease with (acute) exacerbation: Secondary | ICD-10-CM | POA: Diagnosis present

## 2021-04-09 DIAGNOSIS — E1165 Type 2 diabetes mellitus with hyperglycemia: Secondary | ICD-10-CM | POA: Diagnosis present

## 2021-04-09 DIAGNOSIS — Z955 Presence of coronary angioplasty implant and graft: Secondary | ICD-10-CM | POA: Diagnosis not present

## 2021-04-09 DIAGNOSIS — Z7902 Long term (current) use of antithrombotics/antiplatelets: Secondary | ICD-10-CM | POA: Diagnosis not present

## 2021-04-09 DIAGNOSIS — Z66 Do not resuscitate: Secondary | ICD-10-CM | POA: Diagnosis present

## 2021-04-09 DIAGNOSIS — Z9981 Dependence on supplemental oxygen: Secondary | ICD-10-CM

## 2021-04-09 DIAGNOSIS — K5904 Chronic idiopathic constipation: Secondary | ICD-10-CM | POA: Diagnosis present

## 2021-04-09 DIAGNOSIS — I471 Supraventricular tachycardia: Secondary | ICD-10-CM | POA: Diagnosis not present

## 2021-04-09 DIAGNOSIS — Z79899 Other long term (current) drug therapy: Secondary | ICD-10-CM | POA: Diagnosis not present

## 2021-04-09 DIAGNOSIS — Z8249 Family history of ischemic heart disease and other diseases of the circulatory system: Secondary | ICD-10-CM

## 2021-04-09 DIAGNOSIS — I5033 Acute on chronic diastolic (congestive) heart failure: Secondary | ICD-10-CM | POA: Diagnosis present

## 2021-04-09 DIAGNOSIS — Z20822 Contact with and (suspected) exposure to covid-19: Secondary | ICD-10-CM | POA: Diagnosis present

## 2021-04-09 DIAGNOSIS — I509 Heart failure, unspecified: Secondary | ICD-10-CM | POA: Diagnosis not present

## 2021-04-09 DIAGNOSIS — R778 Other specified abnormalities of plasma proteins: Secondary | ICD-10-CM

## 2021-04-09 DIAGNOSIS — Z888 Allergy status to other drugs, medicaments and biological substances status: Secondary | ICD-10-CM

## 2021-04-09 DIAGNOSIS — Z7982 Long term (current) use of aspirin: Secondary | ICD-10-CM

## 2021-04-09 DIAGNOSIS — J9611 Chronic respiratory failure with hypoxia: Secondary | ICD-10-CM | POA: Diagnosis not present

## 2021-04-09 DIAGNOSIS — J449 Chronic obstructive pulmonary disease, unspecified: Secondary | ICD-10-CM | POA: Diagnosis present

## 2021-04-09 DIAGNOSIS — Z8546 Personal history of malignant neoplasm of prostate: Secondary | ICD-10-CM

## 2021-04-09 DIAGNOSIS — Z9079 Acquired absence of other genital organ(s): Secondary | ICD-10-CM

## 2021-04-09 LAB — COMPREHENSIVE METABOLIC PANEL
ALT: 16 U/L (ref 0–44)
AST: 16 U/L (ref 15–41)
Albumin: 2.7 g/dL — ABNORMAL LOW (ref 3.5–5.0)
Alkaline Phosphatase: 63 U/L (ref 38–126)
Anion gap: 13 (ref 5–15)
BUN: 18 mg/dL (ref 8–23)
CO2: 27 mmol/L (ref 22–32)
Calcium: 8.9 mg/dL (ref 8.9–10.3)
Chloride: 97 mmol/L — ABNORMAL LOW (ref 98–111)
Creatinine, Ser: 1.44 mg/dL — ABNORMAL HIGH (ref 0.61–1.24)
GFR, Estimated: 51 mL/min — ABNORMAL LOW (ref >60)
Glucose, Bld: 100 mg/dL — ABNORMAL HIGH (ref 70–99)
Potassium: 3.2 mmol/L — ABNORMAL LOW (ref 3.5–5.1)
Sodium: 137 mmol/L (ref 135–145)
Total Bilirubin: 0.9 mg/dL (ref 0.3–1.2)
Total Protein: 6.4 g/dL — ABNORMAL LOW (ref 6.5–8.1)

## 2021-04-09 LAB — HIV ANTIBODY (ROUTINE TESTING W REFLEX): HIV Screen 4th Generation wRfx: NONREACTIVE

## 2021-04-09 LAB — CBC
HCT: 36.7 % — ABNORMAL LOW (ref 39.0–52.0)
Hemoglobin: 11.3 g/dL — ABNORMAL LOW (ref 13.0–17.0)
MCH: 27.2 pg (ref 26.0–34.0)
MCHC: 30.8 g/dL (ref 30.0–36.0)
MCV: 88.2 fL (ref 80.0–100.0)
Platelets: 451 10*3/uL — ABNORMAL HIGH (ref 150–400)
RBC: 4.16 MIL/uL — ABNORMAL LOW (ref 4.22–5.81)
RDW: 21.9 % — ABNORMAL HIGH (ref 11.5–15.5)
WBC: 8.2 10*3/uL (ref 4.0–10.5)
nRBC: 0.6 % — ABNORMAL HIGH (ref 0.0–0.2)

## 2021-04-09 LAB — LACTIC ACID, PLASMA
Lactic Acid, Venous: 3.2 mmol/L (ref 0.5–1.9)
Lactic Acid, Venous: 2.9 mmol/L (ref 0.5–1.9)

## 2021-04-09 LAB — TROPONIN I (HIGH SENSITIVITY)
Troponin I (High Sensitivity): 142 ng/L (ref <18)
Troponin I (High Sensitivity): 172 ng/L (ref <18)

## 2021-04-09 LAB — RESP PANEL BY RT-PCR (FLU A&B, COVID) ARPGX2
Influenza A by PCR: NEGATIVE
Influenza B by PCR: NEGATIVE
SARS Coronavirus 2 by RT PCR: NEGATIVE

## 2021-04-09 LAB — GLUCOSE, CAPILLARY: Glucose-Capillary: 228 mg/dL — ABNORMAL HIGH (ref 70–99)

## 2021-04-09 LAB — PHOSPHORUS: Phosphorus: 3.5 mg/dL (ref 2.5–4.6)

## 2021-04-09 LAB — MAGNESIUM: Magnesium: 1.8 mg/dL (ref 1.7–2.4)

## 2021-04-09 MED ORDER — ACETAMINOPHEN 650 MG RE SUPP: 650.0000 mg | Freq: Four times a day (QID) | RECTAL | Status: DC | PRN

## 2021-04-09 MED ORDER — DOXYCYCLINE HYCLATE 100 MG PO TABS
100.0000 mg | ORAL_TABLET | Freq: Two times a day (BID) | ORAL | Status: AC
  Administered 2021-04-09 – 2021-04-14 (×10): 100 mg via ORAL
  Filled 2021-04-09 (×10): qty 1

## 2021-04-09 MED ORDER — FLUTICASONE FUROATE-VILANTEROL 200-25 MCG/INH IN AEPB
1.0000 | INHALATION_SPRAY | Freq: Every day | RESPIRATORY_TRACT | Status: DC
  Administered 2021-04-10 – 2021-04-14 (×4): 1 via RESPIRATORY_TRACT
  Filled 2021-04-09: qty 28

## 2021-04-09 MED ORDER — BISOPROLOL FUMARATE 5 MG PO TABS: 5.0000 mg | ORAL_TABLET | Freq: Every day | ORAL | Status: DC

## 2021-04-09 MED ORDER — MAGNESIUM OXIDE 400 (241.3 MG) MG PO TABS
400.0000 mg | ORAL_TABLET | Freq: Every day | ORAL | Status: DC
  Administered 2021-04-10 – 2021-04-14 (×5): 400 mg via ORAL
  Filled 2021-04-09 (×5): qty 1

## 2021-04-09 MED ORDER — GLUCERNA SHAKE PO LIQD
237.0000 mL | Freq: Two times a day (BID) | ORAL | Status: DC
  Administered 2021-04-10 (×2): 237 mL via ORAL
  Filled 2021-04-09: qty 237

## 2021-04-09 MED ORDER — TIOTROPIUM BROMIDE MONOHYDRATE 2.5 MCG/ACT IN AERS: 2.0000 | INHALATION_SPRAY | Freq: Every day | RESPIRATORY_TRACT | Status: DC

## 2021-04-09 MED ORDER — BISOPROLOL FUMARATE 5 MG PO TABS
10.0000 mg | ORAL_TABLET | Freq: Every day | ORAL | Status: DC
  Administered 2021-04-10: 10 mg via ORAL
  Filled 2021-04-09: qty 2

## 2021-04-09 MED ORDER — POTASSIUM CHLORIDE CRYS ER 20 MEQ PO TBCR
40.0000 meq | EXTENDED_RELEASE_TABLET | Freq: Once | ORAL | Status: AC
  Administered 2021-04-09: 40 meq via ORAL
  Filled 2021-04-09: qty 2

## 2021-04-09 MED ORDER — IPRATROPIUM-ALBUTEROL 0.5-2.5 (3) MG/3ML IN SOLN
3.0000 mL | Freq: Once | RESPIRATORY_TRACT | Status: AC
  Administered 2021-04-09: 3 mL via RESPIRATORY_TRACT
  Filled 2021-04-09: qty 3

## 2021-04-09 MED ORDER — ENOXAPARIN SODIUM 40 MG/0.4ML ~~LOC~~ SOLN
40.0000 mg | SUBCUTANEOUS | Status: DC
  Administered 2021-04-09 – 2021-04-13 (×5): 40 mg via SUBCUTANEOUS
  Filled 2021-04-09 (×5): qty 0.4

## 2021-04-09 MED ORDER — ASPIRIN EC 81 MG PO TBEC
81.0000 mg | DELAYED_RELEASE_TABLET | Freq: Every day | ORAL | Status: DC
  Administered 2021-04-10 – 2021-04-14 (×5): 81 mg via ORAL
  Filled 2021-04-09 (×5): qty 1

## 2021-04-09 MED ORDER — ATORVASTATIN CALCIUM 40 MG PO TABS
40.0000 mg | ORAL_TABLET | Freq: Every day | ORAL | Status: DC
  Administered 2021-04-09 – 2021-04-13 (×5): 40 mg via ORAL
  Filled 2021-04-09 (×5): qty 1

## 2021-04-09 MED ORDER — ACETAMINOPHEN 325 MG PO TABS
650.0000 mg | ORAL_TABLET | Freq: Four times a day (QID) | ORAL | Status: DC | PRN
  Administered 2021-04-10 – 2021-04-11 (×2): 650 mg via ORAL
  Filled 2021-04-09: qty 2

## 2021-04-09 MED ORDER — SODIUM CHLORIDE 0.9 % IV SOLN: INTRAVENOUS | Status: DC

## 2021-04-09 MED ORDER — IPRATROPIUM BROMIDE 0.02 % IN SOLN: 0.5000 mg | Freq: Four times a day (QID) | RESPIRATORY_TRACT | Status: DC

## 2021-04-09 MED ORDER — METHYLPREDNISOLONE SODIUM SUCC 125 MG IJ SOLR
125.0000 mg | Freq: Once | INTRAMUSCULAR | Status: AC
  Administered 2021-04-09: 125 mg via INTRAVENOUS
  Filled 2021-04-09: qty 2

## 2021-04-09 MED ORDER — METHYLPREDNISOLONE SODIUM SUCC 40 MG IJ SOLR
40.0000 mg | Freq: Four times a day (QID) | INTRAMUSCULAR | Status: DC
  Administered 2021-04-09 – 2021-04-10 (×3): 40 mg via INTRAVENOUS
  Filled 2021-04-09 (×3): qty 1

## 2021-04-09 MED ORDER — INSULIN ASPART 100 UNIT/ML ~~LOC~~ SOLN
0.0000 [IU] | Freq: Three times a day (TID) | SUBCUTANEOUS | Status: DC
  Administered 2021-04-10: 7 [IU] via SUBCUTANEOUS
  Administered 2021-04-10: 5 [IU] via SUBCUTANEOUS

## 2021-04-09 MED ORDER — FUROSEMIDE 10 MG/ML IJ SOLN
40.0000 mg | Freq: Once | INTRAMUSCULAR | Status: AC
  Administered 2021-04-09: 40 mg via INTRAVENOUS
  Filled 2021-04-09: qty 4

## 2021-04-09 MED ORDER — DILTIAZEM HCL ER COATED BEADS 240 MG PO CP24: 240.0000 mg | ORAL_CAPSULE | Freq: Every day | ORAL | Status: DC

## 2021-04-09 MED ORDER — POTASSIUM CHLORIDE CRYS ER 10 MEQ PO TBCR
20.0000 meq | EXTENDED_RELEASE_TABLET | Freq: Every day | ORAL | Status: DC
  Administered 2021-04-10 – 2021-04-14 (×5): 20 meq via ORAL
  Filled 2021-04-09 (×6): qty 2

## 2021-04-09 MED ORDER — IPRATROPIUM-ALBUTEROL 0.5-2.5 (3) MG/3ML IN SOLN
3.0000 mL | Freq: Four times a day (QID) | RESPIRATORY_TRACT | Status: DC
  Administered 2021-04-09 – 2021-04-14 (×16): 3 mL via RESPIRATORY_TRACT
  Filled 2021-04-09 (×19): qty 3

## 2021-04-09 MED ORDER — GUAIFENESIN ER 600 MG PO TB12
1200.0000 mg | ORAL_TABLET | Freq: Two times a day (BID) | ORAL | Status: DC
  Administered 2021-04-09 – 2021-04-14 (×10): 1200 mg via ORAL
  Filled 2021-04-09 (×10): qty 2

## 2021-04-09 MED ORDER — ALBUTEROL SULFATE (2.5 MG/3ML) 0.083% IN NEBU: 3.0000 mL | INHALATION_SOLUTION | RESPIRATORY_TRACT | Status: DC | PRN

## 2021-04-09 MED ORDER — METOPROLOL TARTRATE 5 MG/5ML IV SOLN
2.5000 mg | Freq: Four times a day (QID) | INTRAVENOUS | Status: DC | PRN
  Administered 2021-04-09: 2.5 mg via INTRAVENOUS
  Filled 2021-04-09: qty 5

## 2021-04-09 MED ORDER — CLOPIDOGREL BISULFATE 75 MG PO TABS
75.0000 mg | ORAL_TABLET | Freq: Every day | ORAL | Status: DC
  Administered 2021-04-10 – 2021-04-14 (×5): 75 mg via ORAL
  Filled 2021-04-09 (×5): qty 1

## 2021-04-09 MED ORDER — NORTRIPTYLINE HCL 25 MG PO CAPS: 50.0000 mg | ORAL_CAPSULE | Freq: Every day | ORAL | Status: DC

## 2021-04-09 MED ORDER — PANTOPRAZOLE SODIUM 40 MG PO TBEC
40.0000 mg | DELAYED_RELEASE_TABLET | Freq: Every day | ORAL | Status: DC
  Administered 2021-04-10 – 2021-04-14 (×5): 40 mg via ORAL
  Filled 2021-04-09 (×5): qty 1

## 2021-04-09 MED ORDER — POLYETHYLENE GLYCOL 3350 17 G PO PACK: 17.0000 g | PACK | Freq: Every day | ORAL | Status: DC | PRN

## 2021-04-09 MED ORDER — UMECLIDINIUM BROMIDE 62.5 MCG/INH IN AEPB
1.0000 | INHALATION_SPRAY | Freq: Every day | RESPIRATORY_TRACT | Status: DC
  Administered 2021-04-10 – 2021-04-14 (×4): 1 via RESPIRATORY_TRACT
  Filled 2021-04-09: qty 7

## 2021-04-09 MED ORDER — FUROSEMIDE 10 MG/ML IJ SOLN
40.0000 mg | Freq: Two times a day (BID) | INTRAMUSCULAR | Status: DC
  Administered 2021-04-10 – 2021-04-11 (×3): 40 mg via INTRAVENOUS
  Filled 2021-04-09 (×3): qty 4

## 2021-04-09 NOTE — ED Triage Notes (Signed)
Pt arrived by EMS from home where he lives alone. States he has been coming more and more short of breath over that past few days, he is on 4.5L [place] at baseline.   Denies pain at this time, alert and oriented.  States his legs and feet are at their baseline edema.    Pt placed on 5L [place] on arrival and able to speak in only short sentences before breathing becomes labored.

## 2021-04-09 NOTE — ED Notes (Signed)
Daughter at bedside speaking with hospitalist at this time

## 2021-04-09 NOTE — Plan of Care (Signed)
  Problem: Pain Managment: Goal: General experience of comfort will improve Outcome: Progressing   Problem: Safety: Goal: Ability to remain free from injury will improve Outcome: Progressing   

## 2021-04-09 NOTE — ED Notes (Signed)
Trop 146 per lab tech at this time

## 2021-04-09 NOTE — ED Notes (Signed)
LA 3.2 per lab tech at this time, ED MD to be made aware

## 2021-04-09 NOTE — ED Notes (Signed)
Attempted report to 2W at this time

## 2021-04-09 NOTE — Progress Notes (Signed)
RT NOTE:  Pt switched from BIPAP to 6L [place] following breathing treatment. MD ok with change. No distress, WOB normal, SpO2 96%. RN aware.

## 2021-04-09 NOTE — ED Provider Notes (Signed)
[facility] EMERGENCY DEPARTMENT Provider Note   CSN: [id number] Arrival date & time: 04/09/21  1434     History Chief Complaint  Patient presents with  . Shortness of Breath    Brandon [name] is a 75 y.o. male.  Pt presents to the ED today with sob.  Pt has had several admissions this year with a COPD exacerbation.  The last admission was from 3/14-19.  Pt said he feels ok for a few weeks, then starts getting very sob again.  Pt normally wears 4.5 L oxygen via [place].  He has been more sob for the past few days.  EMS increased his oxygen to 5L.  There is no report on initial O2 sat.  Pt denies any f/c.  He's been compliant with his meds.  He's been fully vaccinated against Covid including booster.  He denies any known exposures.  It looks like he was d/c to a SNF at the end of last d/c.  He is from home now.  He is likely unable to care for himself very well.        Past Medical History:  Diagnosis Date  . Abdominal aortic aneurysm without rupture ([place])   . CAD (coronary artery disease)    severe on chest CT   . Chronic alcoholism ([place])   . Chronic idiopathic constipation   . Chronic pancreatitis ([place])   . Chronic respiratory failure with hypoxia ([place])   . Essential hypertension   . GERD (gastroesophageal reflux disease)   . Hyperaldosteronism ([place])   . Malignant neoplasm of prostate ([place])   . Nicotine dependence   . Paroxysmal atrial tachycardia ([place])   . Peripheral vascular disease ([place])   . Serrated polyp of colon   . Type 2 diabetes mellitus [facility])     Patient Active Problem List   Diagnosis Date Noted  . Acute on chronic congestive heart failure ([place])   . Palliative care by specialist   . Advance care planning   . Prolonged QT interval 03/13/2021  . Protein-calorie malnutrition, severe 02/21/2021  . Acute on chronic respiratory failure with hypoxia ([place]) 02/19/2021  . Normocytic anemia 02/19/2021  . (HFpEF) heart failure with preserved ejection  fraction ([place]) 02/01/2021  . Pressure injury of skin 01/09/2021  . Malnutrition of moderate degree 01/09/2021  . Elevated troponin 01/07/2021  . COPD with acute exacerbation ([place]) 01/07/2021  . History of CVA (cerebrovascular accident) 01/07/2021  . COPD exacerbation ([place]) 01/07/2021  . Hypomagnesemia 01/07/2021  . Essential hypertension   . Type 2 diabetes mellitus ([place])   . CAD (coronary artery disease)   . GERD (gastroesophageal reflux disease)   . Acute on chronic respiratory failure with hypoxemia ([place])   . COPD without exacerbation ([place])   . Tobacco abuse   . Hyponatremia   . Hyperkalemia   . Ischemic stroke [facility])     Past Surgical History:  Procedure Laterality Date  . CORONARY ATHERECTOMY N/A 01/11/2021   Procedure: CORONARY ATHERECTOMY;  Surgeon: Sherren Mocha, MD;  Location: [facility];  Service: Cardiovascular;  Laterality: N/A;  . CORONARY ATHERECTOMY N/A 01/13/2021   Procedure: CORONARY ATHERECTOMY;  Surgeon: Martinique, Peter M, MD;  Location: [facility];  Service: Cardiovascular;  Laterality: N/A;  . CORONARY STENT INTERVENTION N/A 01/13/2021   Procedure: CORONARY STENT INTERVENTION;  Surgeon: Martinique, Peter M, MD;  Location: [facility];  Service: Cardiovascular;  Laterality: N/A;  . INTRAVASCULAR ULTRASOUND/IVUS N/A 01/13/2021   Procedure: Intravascular Ultrasound/IVUS;  Surgeon: Martinique, Peter M, MD;  Location: [facility];  Service: Cardiovascular;  Laterality: N/A;  . PROSTATECTOMY  12/2012  . RIGHT/LEFT HEART CATH AND CORONARY ANGIOGRAPHY N/A 01/11/2021   Procedure: RIGHT/LEFT HEART CATH AND CORONARY ANGIOGRAPHY;  Surgeon: Sherren Mocha, MD;  Location: [facility];  Service: Cardiovascular;  Laterality: N/A;       Family History  Problem Relation Age of Onset  . Hypertension Mother   . Hypertension Father     Social History   Tobacco Use  . Smoking status: Former Smoker    Packs/day: 0.50    Years: 60.00    Pack  years: 30.00    Types: Cigarettes    Quit date: 10/08/2020    Years since quitting: 0.5  . Smokeless tobacco: Never Used  Substance Use Topics  . Alcohol use: Not Currently  . Drug use: Never    Home Medications Prior to Admission medications   Medication Sig Start Date End Date Taking? Authorizing Provider  albuterol (VENTOLIN HFA) 108 (90 Base) MCG/ACT inhaler Inhale 2 puffs into the lungs every 4 (four) hours as needed for wheezing or shortness of breath. 11/24/19  Yes [provider]  aspirin 81 MG EC tablet Take 1 tablet (81 mg total) by mouth daily. 10/21/20  Yes Little Ishikawa, MD  atorvastatin (LIPITOR) 40 MG tablet Take 40 mg by mouth at bedtime.  06/11/19  Yes [provider]  bisoprolol (ZEBETA) 5 MG tablet Take 1 tablet (5 mg total) by mouth daily. 01/14/21  Yes Sheikh, Omair Latif, DO  clopidogrel (PLAVIX) 75 MG tablet Take 1 tablet (75 mg total) by mouth daily. 10/22/20  Yes Little Ishikawa, MD  diltiazem (CARDIZEM CD) 240 MG 24 hr capsule Take 1 capsule (240 mg total) by mouth daily. 01/14/21  Yes Sheikh, Omair Latif, DO  empagliflozin (JARDIANCE) 10 MG TABS tablet Take 1 tablet (10 mg total) by mouth daily before breakfast. 03/10/21  Yes Vann, Jessica U, DO  feeding supplement, GLUCERNA SHAKE, (GLUCERNA SHAKE) LIQD Take 237 mLs by mouth 2 (two) times daily between meals. 01/14/21  Yes Sheikh, Omair Latif, DO  Fluticasone-Salmeterol (ADVAIR) 250-50 MCG/DOSE AEPB Inhale 1 puff into the lungs 2 (two) times daily.   Yes [provider]  furosemide (LASIX) 40 MG tablet Take 1 tablet (40 mg total) by mouth 2 (two) times daily. 02/24/21  Yes Vann, Jessica U, DO  insulin aspart protamine - aspart (NOVOLOG 70/30 MIX) (70-30) 100 UNIT/ML FlexPen Inject 0.2 mLs (20 Units total) into the skin 2 (two) times daily with a meal. 01/14/21  Yes Sheikh, Omair Latif, DO  metFORMIN (GLUCOPHAGE) 1000 MG tablet Take by mouth 2 (two) times daily with a meal.   Yes  [provider]  Multiple Vitamin (MULTIVITAMIN WITH MINERALS) TABS tablet Take 1 tablet by mouth daily. 01/14/21  Yes Sheikh, Omair Latif, DO  naproxen sodium (ALEVE) 220 MG tablet Take 440 mg by mouth daily as needed (pain).   Yes [provider]  nortriptyline (PAMELOR) 50 MG capsule Take 50 mg by mouth at bedtime. For restless leg symdrome   Yes [provider]  Omega-3 1000 MG CAPS Take 1,000 mg by mouth in the morning and at bedtime.    Yes [provider]  pantoprazole (PROTONIX) 40 MG tablet Take 1 tablet (40 mg total) by mouth daily. Patient taking differently: Take 40 mg by mouth daily before breakfast. 01/14/21  Yes Sheikh, [place] Latif, DO  polyethylene glycol [facility] /  GLYCOLAX) 17 g packet Take 17 g by mouth daily as needed (constipation). Mix with water or other liquid as instructed before drinking   Yes [provider]  potassium chloride (KLOR-CON) 10 MEQ tablet Take 2 tablets (20 mEq total) by mouth daily. Patient taking differently: Take 10 mEq by mouth daily. 02/24/21  Yes Vann, Jessica U, DO  senna-docusate (SENOKOT-S) 8.6-50 MG tablet Take 1 tablet by mouth at bedtime as needed for mild constipation. Patient taking differently: Take 1 tablet by mouth daily at 6 (six) AM. 01/14/21  Yes Raiford Noble Latif, DO  Tiotropium Bromide Monohydrate 2.5 MCG/ACT AERS Inhale 2 puffs into the lungs daily.   Yes [provider]  empagliflozin (JARDIANCE) 10 MG TABS tablet TAKE 1 TABLET (10 MG TOTAL) BY MOUTH DAILY. Patient not taking: No sig reported 02/24/21 02/24/22  Geradine Girt, DO  furosemide (LASIX) 40 MG tablet TAKE 1 TABLET (40 MG TOTAL) BY MOUTH TWO TIMES DAILY. Patient not taking: No sig reported 02/24/21 02/24/22  Geradine Girt, DO  Magnesium Oxide 420 MG TABS Take 420 mg by mouth daily.    [provider]  OXYGEN Inhale 4.5 L/min into the lungs continuous.    [provider]  potassium chloride (KLOR-CON) 10  MEQ tablet TAKE 2 TABLETS (20 MEQ TOTAL) BY MOUTH DAILY. Patient not taking: No sig reported 02/24/21 02/24/22  Geradine Girt, DO    Allergies    Hydrochlorothiazide  Review of Systems   Review of Systems  Respiratory: Positive for cough and shortness of breath.   All other systems reviewed and are negative.   Physical Exam Updated Vital Signs BP 107/68   Pulse 95   Temp 97.8 F (36.6 C) (Axillary)   Resp 20   SpO2 98%   Physical Exam Vitals and nursing note reviewed.  Constitutional:      General: He is in acute distress.     Appearance: He is well-developed. He is ill-appearing.  HENT:     Head: Normocephalic and atraumatic.     Mouth/Throat:     Mouth: Mucous membranes are dry.     Pharynx: Oropharynx is clear.  Eyes:     Extraocular Movements: Extraocular movements intact.     Pupils: Pupils are equal, round, and reactive to light.  Cardiovascular:     Rate and Rhythm: Regular rhythm. Tachycardia present.  Pulmonary:     Effort: Tachypnea present.     Breath sounds: Wheezing and rhonchi present.  Abdominal:     General: Bowel sounds are normal.     Palpations: Abdomen is soft.  Musculoskeletal:     Cervical back: Normal range of motion and neck supple.     Right lower leg: Edema present.     Left lower leg: Edema present.     Comments: Bilateral chronic skin changes  Skin:    General: Skin is warm.     Capillary Refill: Capillary refill takes less than 2 seconds.  Neurological:     General: No focal deficit present.     Mental Status: He is alert and oriented to person, place, and time.  Psychiatric:        Mood and Affect: Mood normal.        Behavior: Behavior normal.     ED Results / Procedures / Treatments   Labs (all labs ordered are listed, but only abnormal results are displayed) Labs Reviewed  CBC - Abnormal; Notable for the following components:  Result Value   RBC 4.16 (*)    Hemoglobin 11.3 (*)    HCT 36.7 (*)    RDW 21.9 (*)     Platelets 451 (*)    nRBC 0.6 (*)    All other components within normal limits  COMPREHENSIVE METABOLIC PANEL - Abnormal; Notable for the following components:   Potassium 3.2 (*)    Chloride 97 (*)    Glucose, Bld 100 (*)    Creatinine, Ser 1.44 (*)    Total Protein 6.4 (*)    Albumin 2.7 (*)    GFR, Estimated 51 (*)    All other components within normal limits  LACTIC ACID, PLASMA - Abnormal; Notable for the following components:   Lactic Acid, Venous 3.2 (*)    All other components within normal limits  TROPONIN I (HIGH SENSITIVITY) - Abnormal; Notable for the following components:   Troponin I (High Sensitivity) 142 (*)    All other components within normal limits  RESP PANEL BY RT-PCR (FLU A&B, COVID) ARPGX2  CULTURE, BLOOD (ROUTINE X 2)  CULTURE, BLOOD (ROUTINE X 2)  LACTIC ACID, PLASMA  TROPONIN I (HIGH SENSITIVITY)    EKG EKG Interpretation  Date/Time:  Sunday April 09 2021 14:46:06 EDT Ventricular Rate:  122 PR Interval:  142 QRS Duration: 105 QT Interval:  359 QTC Calculation: 512 R Axis:   61 Text Interpretation: Sinus tachycardia with irregular rate Prolonged QT interval No significant change since last tracing Confirmed by Isla Pence [phone]) on 04/09/2021 3:27:04 PM   Radiology DG Chest Port 1 View  Result Date: 04/09/2021 CLINICAL DATA:  Dyspnea. EXAM: PORTABLE CHEST 1 VIEW COMPARISON:  03/13/2021 FINDINGS: Lungs are hyperexpanded. Interstitial markings are diffusely coarsened with chronic features. Basilar predominant alveolar opacity again noted, less pronounced than on the prior study. No substantial pleural effusion. Cardiopericardial silhouette is at upper limits of normal for size. Bones are diffusely demineralized. IMPRESSION: Emphysema with persistent basilar predominant alveolar opacity, less pronounced than on the prior study. Imaging features suggest edema superimposed on chronic underlying interstitial lung disease. Electronically Signed   By:  Misty Stanley M.D.   On: 04/09/2021 15:58    Procedures Procedures   Medications Ordered in ED Medications  methylPREDNISolone sodium succinate (SOLU-MEDROL) 125 mg/2 mL injection 125 mg (125 mg Intravenous Given 04/09/21 1615)  ipratropium-albuterol (DUONEB) 0.5-2.5 (3) MG/3ML nebulizer solution 3 mL (3 mLs Nebulization Given 04/09/21 1615)  furosemide (LASIX) injection 40 mg (40 mg Intravenous Given 04/09/21 1709)    ED Course  I have reviewed the triage vital signs and the nursing notes.  Pertinent labs & imaging results that were available during my care of the patient were reviewed by me and considered in my medical decision making (see chart for details).    MDM Rules/Calculators/A&P                          Pt is very sob.  He is put on bipap which has helped him quite a bit.  Pt's troponin is elevated.  He does have a hx of CAD, but I think it is elevated now because of demand.  He does not have any cp.  Pt's sx are likely a combination of COPD and CHF.  I suspect he has some noncompliance likely because he can't care for himself well at home.    Covid negative.  Pt d/w Dr. Roosevelt Locks ([facility]) for admission.  CRITICAL CARE Performed by: Isla Pence  Total critical care time: 30 minutes  Critical care time was exclusive of separately billable procedures and treating other patients.  Critical care was necessary to treat or prevent imminent or life-threatening deterioration.  Critical care was time spent personally by me on the following activities: development of treatment plan with patient and/or surrogate as well as nursing, discussions with consultants, evaluation of patient's response to treatment, examination of patient, obtaining history from patient or surrogate, ordering and performing treatments and interventions, ordering and review of laboratory studies, ordering and review of radiographic studies, pulse oximetry and re-evaluation of patient's condition.  Sat[place] Brandon [name] was evaluated in Emergency Department on 04/09/2021 for the symptoms described in the history of present illness. He was evaluated in the context of the global COVID-19 pandemic, which necessitated consideration that the patient might be at risk for infection with the SARS-CoV-2 virus that causes COVID-19. Institutional protocols and algorithms that pertain to the evaluation of patients at risk for COVID-19 are in a state of rapid change based on information released by regulatory bodies including the CDC and federal and state organizations. These policies and algorithms were followed during the patient's care in the ED. Final Clinical Impression(s) / ED Diagnoses Final diagnoses:  COPD exacerbation ([place])  Acute on chronic congestive heart failure, unspecified heart failure type ([place])  Elevated troponin    Rx / DC Orders ED Discharge Orders    None       Isla Pence, MD 04/09/21 1744

## 2021-04-09 NOTE — ED Notes (Signed)
Attempted to call report to 2W at this time, states won't take report till room is clean.

## 2021-04-09 NOTE — ED Notes (Signed)
Attempted to give report at this time

## 2021-04-09 NOTE — ED Notes (Signed)
RT at bedside for BiPaP placement

## 2021-04-09 NOTE — H&P (Signed)
History and Physical    Brandon [name] KYH:[id number] DOB: Aug 24, 1946 DOA: 04/09/2021  PCP: Pcp, No (Confirm with patient/family/NH records and if not entered, this has to be entered at [facility] point of entry) Patient coming from: Home  I have personally briefly reviewed patient's old medical records in [place]  Chief Complaint: SOB, cough  HPI: Brandon [name] is a 75 y.o. male with medical history significant of COPD, chronic hypoxic respiratory on 2.5 L baseline, chronic diastolic CHF, PVD, IDDM, history of noncompliant with home medications, presented with increasing shortness of breath and cough.  Patient unable to provide any history given severe respiratory distress, most history provided by patient's daughter at bedside.  Daughter reported patient has been compliant with all his medications as enforced by patient family via phone and home visit.  This time, patient started to have breathing symptoms 2 to 3 days ago, with coughing and increasing shortness of breath.  Denies any chest pain, no fever chills.  Patient called EMS this morning, EMS arrived on patient hypoxic and tachypneic and placed him on 5 L oxygen. ED Course: Dyspnea, placed on BiPAP, chest x-ray showed pulmonary edema, lactic acid 3.2, potassium 3.2.  Creatinine 1.4.  Review of Systems: As per HPI otherwise 14 point review of systems negative.    Past Medical History:  Diagnosis Date  . Abdominal aortic aneurysm without rupture ([place])   . CAD (coronary artery disease)    severe on chest CT   . Chronic alcoholism ([place])   . Chronic idiopathic constipation   . Chronic pancreatitis ([place])   . Chronic respiratory failure with hypoxia ([place])   . Essential hypertension   . GERD (gastroesophageal reflux disease)   . Hyperaldosteronism ([place])   . Malignant neoplasm of prostate ([place])   . Nicotine dependence   . Paroxysmal atrial tachycardia ([place])   . Peripheral vascular disease ([place])   . Serrated polyp of colon   .  Type 2 diabetes mellitus ([place])     Past Surgical History:  Procedure Laterality Date  . CORONARY ATHERECTOMY N/A 01/11/2021   Procedure: CORONARY ATHERECTOMY;  Surgeon: Sherren Mocha, MD;  Location: [place] [facility];  Service: Cardiovascular;  Laterality: N/A;  . CORONARY ATHERECTOMY N/A 01/13/2021   Procedure: CORONARY ATHERECTOMY;  Surgeon: Martinique, Peter M, MD;  Location: [place] [facility];  Service: Cardiovascular;  Laterality: N/A;  . CORONARY STENT INTERVENTION N/A 01/13/2021   Procedure: CORONARY STENT INTERVENTION;  Surgeon: Martinique, Peter M, MD;  Location: [place] [facility];  Service: Cardiovascular;  Laterality: N/A;  . INTRAVASCULAR ULTRASOUND/IVUS N/A 01/13/2021   Procedure: Intravascular Ultrasound/IVUS;  Surgeon: Martinique, Peter M, MD;  Location: [place] [facility];  Service: Cardiovascular;  Laterality: N/A;  . PROSTATECTOMY  12/2012  . RIGHT/LEFT HEART CATH AND CORONARY ANGIOGRAPHY N/A 01/11/2021   Procedure: RIGHT/LEFT HEART CATH AND CORONARY ANGIOGRAPHY;  Surgeon: Sherren Mocha, MD;  Location: [place] [facility];  Service: Cardiovascular;  Laterality: N/A;     reports that he quit smoking about 6 months ago. His smoking use included cigarettes. He has a 30.00 pack-year smoking history. He has never used smokeless tobacco. He reports previous alcohol use. He reports that he does not use drugs.  Allergies  Allergen Reactions  . Hydrochlorothiazide Other (See Comments)    Hypokalemia (Pt claims not allergic 04/09/21)    Family History  Problem Relation Age of Onset  . Hypertension Mother   . Hypertension Father      Prior to Admission  medications   Medication Sig Start Date End Date Taking? Authorizing Provider  albuterol (VENTOLIN HFA) 108 (90 Base) MCG/ACT inhaler Inhale 2 puffs into the lungs every 4 (four) hours as needed for wheezing or shortness of breath. 11/24/19  Yes [provider]  aspirin 81 MG EC tablet Take 1 tablet (81 mg total) by mouth daily.  10/21/20  Yes Little Ishikawa, MD  atorvastatin (LIPITOR) 40 MG tablet Take 40 mg by mouth at bedtime.  06/11/19  Yes [provider]  bisoprolol (ZEBETA) 5 MG tablet Take 1 tablet (5 mg total) by mouth daily. 01/14/21  Yes Sheikh, Omair Latif, DO  clopidogrel (PLAVIX) 75 MG tablet Take 1 tablet (75 mg total) by mouth daily. 10/22/20  Yes Little Ishikawa, MD  diltiazem (CARDIZEM CD) 240 MG 24 hr capsule Take 1 capsule (240 mg total) by mouth daily. 01/14/21  Yes Sheikh, Omair Latif, DO  empagliflozin (JARDIANCE) 10 MG TABS tablet Take 1 tablet (10 mg total) by mouth daily before breakfast. 03/10/21  Yes Vann, Jessica U, DO  feeding supplement, GLUCERNA SHAKE, (GLUCERNA SHAKE) LIQD Take 237 mLs by mouth 2 (two) times daily between meals. 01/14/21  Yes Sheikh, Omair Latif, DO  Fluticasone-Salmeterol (ADVAIR) 250-50 MCG/DOSE AEPB Inhale 1 puff into the lungs 2 (two) times daily.   Yes [provider]  furosemide (LASIX) 40 MG tablet Take 1 tablet (40 mg total) by mouth 2 (two) times daily. 02/24/21  Yes Vann, Jessica U, DO  insulin aspart protamine - aspart (NOVOLOG 70/30 MIX) (70-30) 100 UNIT/ML FlexPen Inject 0.2 mLs (20 Units total) into the skin 2 (two) times daily with a meal. 01/14/21  Yes Sheikh, Omair Latif, DO  metFORMIN (GLUCOPHAGE) 1000 MG tablet Take by mouth 2 (two) times daily with a meal.   Yes [provider]  Multiple Vitamin (MULTIVITAMIN WITH MINERALS) TABS tablet Take 1 tablet by mouth daily. 01/14/21  Yes Sheikh, Omair Latif, DO  naproxen sodium (ALEVE) 220 MG tablet Take 440 mg by mouth daily as needed (pain).   Yes [provider]  nortriptyline (PAMELOR) 50 MG capsule Take 50 mg by mouth at bedtime. For restless leg symdrome   Yes [provider]  Omega-3 1000 MG CAPS Take 1,000 mg by mouth in the morning and at bedtime.    Yes [provider]  pantoprazole (PROTONIX) 40 MG tablet Take 1 tablet (40 mg total) by mouth  daily. Patient taking differently: Take 40 mg by mouth daily before breakfast. 01/14/21  Yes Sheikh, Omair Latif, DO  polyethylene glycol (MIRALAX / GLYCOLAX) 17 g packet Take 17 g by mouth daily as needed (constipation). Mix with water or other liquid as instructed before drinking   Yes [provider]  potassium chloride (KLOR-CON) 10 MEQ tablet Take 2 tablets (20 mEq total) by mouth daily. Patient taking differently: Take 10 mEq by mouth daily. 02/24/21  Yes Vann, Jessica U, DO  senna-docusate (SENOKOT-S) 8.6-50 MG tablet Take 1 tablet by mouth at bedtime as needed for mild constipation. Patient taking differently: Take 1 tablet by mouth daily at 6 (six) AM. 01/14/21  Yes Raiford Noble Latif, DO  Tiotropium Bromide Monohydrate 2.5 MCG/ACT AERS Inhale 2 puffs into the lungs daily.   Yes [provider]  empagliflozin (JARDIANCE) 10 MG TABS tablet TAKE 1 TABLET (10 MG TOTAL) BY MOUTH DAILY. Patient not taking: No sig reported 02/24/21 02/24/22  Geradine Girt, DO  furosemide (LASIX) 40 MG tablet TAKE 1 TABLET (40 MG  TOTAL) BY MOUTH TWO TIMES DAILY. Patient not taking: No sig reported 02/24/21 02/24/22  Geradine Girt, DO  Magnesium Oxide 420 MG TABS Take 420 mg by mouth daily.    [provider]  OXYGEN Inhale 4.5 L/min into the lungs continuous.    [provider]  potassium chloride (KLOR-CON) 10 MEQ tablet TAKE 2 TABLETS (20 MEQ TOTAL) BY MOUTH DAILY. Patient not taking: No sig reported 02/24/21 02/24/22  Geradine Girt, DO    Physical Exam: Vitals:   04/09/21 1645 04/09/21 1700 04/09/21 1715 04/09/21 1730  BP: 122/71 122/74 113/70 107/68  Pulse: (!) 136 (!) 107 90 (!) 143  Resp:  19    Temp:      TempSrc:      SpO2: 99% 99% 99% 98%    Constitutional: NAD, calm, comfortable Vitals:   04/09/21 1645 04/09/21 1700 04/09/21 1715 04/09/21 1730  BP: 122/71 122/74 113/70 107/68  Pulse: (!) 136 (!) 107 90 (!) 143  Resp:  19    Temp:      TempSrc:       SpO2: 99% 99% 99% 98%   Eyes: PERRL, lids and conjunctivae normal ENMT: Mucous membranes are moist. Posterior pharynx clear of any exudate or lesions.Normal dentition.  Neck: normal, supple, no masses, no thyromegaly Respiratory: Diminished breathing sound bilaterally, scattered wheezing, fine crackles on B/L.  Increasing respiratory effort. No accessory muscle use.  Cardiovascular: Regular rate and rhythm, no murmurs / rubs / gallops. 1+ extremity edema. 2+ pedal pulses. No carotid bruits.  Abdomen: no tenderness, no masses palpated. No hepatosplenomegaly. Bowel sounds positive.  Musculoskeletal: no clubbing / cyanosis. No joint deformity upper and lower extremities. Good ROM, no contractures. Normal muscle tone.  Skin: no rashes, lesions, ulcers. No induration Neurologic: No facial droops, following all commands, moving all limbs psychiatric: Awake, lethargic   Labs on Admission: I have personally reviewed following labs and imaging studies  CBC: Recent Labs  Lab 04/09/21 1521  WBC 8.2  HGB 11.3*  HCT 36.7*  MCV 88.2  PLT 119*   Basic Metabolic Panel: Recent Labs  Lab 04/09/21 1521  NA 137  K 3.2*  CL 97*  CO2 27  GLUCOSE 100*  BUN 18  CREATININE 1.44*  CALCIUM 8.9   GFR: CrCl cannot be calculated (Unknown ideal weight.). Liver Function Tests: Recent Labs  Lab 04/09/21 1521  AST 16  ALT 16  ALKPHOS 63  BILITOT 0.9  PROT 6.4*  ALBUMIN 2.7*   No results for input(s): LIPASE, AMYLASE in the last 168 hours. No results for input(s): AMMONIA in the last 168 hours. Coagulation Profile: No results for input(s): INR, PROTIME in the last 168 hours. Cardiac Enzymes: No results for input(s): CKTOTAL, CKMB, CKMBINDEX, TROPONINI in the last 168 hours. BNP (last 3 results) No results for input(s): PROBNP in the last 8760 hours. HbA1C: No results for input(s): HGBA1C in the last 72 hours. CBG: No results for input(s): GLUCAP in the last 168 hours. Lipid Profile: No  results for input(s): CHOL, HDL, LDLCALC, TRIG, CHOLHDL, LDLDIRECT in the last 72 hours. Thyroid Function Tests: No results for input(s): TSH, T4TOTAL, FREET4, T3FREE, THYROIDAB in the last 72 hours. Anemia Panel: No results for input(s): VITAMINB12, FOLATE, FERRITIN, TIBC, IRON, RETICCTPCT in the last 72 hours. Urine analysis:    Component Value Date/Time   COLORURINE YELLOW 01/07/2021 2010   APPEARANCEUR CLEAR 01/07/2021 2010   LABSPEC 1.006 01/07/2021 2010   PHURINE 5.0 01/07/2021 2010   GLUCOSEU  NEGATIVE 01/07/2021 2010   HGBUR NEGATIVE 01/07/2021 2010   [place] NEGATIVE 01/07/2021 2010   [place] NEGATIVE 01/07/2021 2010   PROTEINUR NEGATIVE 01/07/2021 2010   NITRITE NEGATIVE 01/07/2021 2010   LEUKOCYTESUR NEGATIVE 01/07/2021 2010    Radiological Exams on Admission: DG Chest Port 1 View  Result Date: 04/09/2021 CLINICAL DATA:  Dyspnea. EXAM: PORTABLE CHEST 1 VIEW COMPARISON:  03/13/2021 FINDINGS: Lungs are hyperexpanded. Interstitial markings are diffusely coarsened with chronic features. Basilar predominant alveolar opacity again noted, less pronounced than on the prior study. No substantial pleural effusion. Cardiopericardial silhouette is at upper limits of normal for size. Bones are diffusely demineralized. IMPRESSION: Emphysema with persistent basilar predominant alveolar opacity, less pronounced than on the prior study. Imaging features suggest edema superimposed on chronic underlying interstitial lung disease. Electronically Signed   By: Misty Stanley M.D.   On: 04/09/2021 15:58    EKG: Independently reviewed.  Sinus arrhythmia  Assessment/Plan Active Problems:   COPD with acute exacerbation (HCC)  (please populate well all problems here in Problem List. (For example, if patient is on BP meds at home and you resume or decide to hold them, it is a problem that needs to be her. Same for CAD, COPD, HLD and so on)  Acute COPD exacerbation -IV Solu-Medrol, breathing  treatment -Continue BiPAP for tonight -Start doxycycline -No record of PFT in our system, given the frequent COPD flareup, consult pulmonology for evaluation for home BiPAP.  Acute on chronic chronic diastolic CHF -Signs of fluid overload -D/C Cardizem, and same time increase bisoprolol from 5 to 10 mg daily, start as needed metoprolol for BP and heart rate control. -Increase Lasix -Repeat chest x-ray in the morning.  AKI  -Signs of fluid overload with crackles in the lungs and chest x-ray showed pulmonary edema -Weight 168 Lbs today compared to 172 Lbs one month ago. -Increase Lasix to IV 40 mg twice daily.  Troponins elevation -Denied any chest pains, suspect troponin elevation secondary to AKI and demanding ischemia from COPD exacerbation.  Will trend troponins and repeat EKG in the morning.  Elevated lactic acid level -Suspect related to AKI and ongoing Metformin use, no significant signs of sepsis.  Sinus arrhythmia -Similar EKG presentation as compared to last month. -Given that patient is in acute CHF decompensation, decided to discontinue Cardizem and switch to as needed metoprolol.  IDDM -D/C Metformin -Add Sliding scale  CAD -Continue aspirin, Plavix and statin  DVT prophylaxis: Lovenox  code Status: Full Code Family Communication: Daughter at bedside Disposition Plan: Expect more than 2 midnight hospital stay to treat COPD and CHF Consults called: Pulm Admission status: PCU   Lequita Halt MD [facility] Pager [phone]  04/09/2021, 5:38 PM

## 2021-04-10 ENCOUNTER — Inpatient Hospital Stay (HOSPITAL_COMMUNITY): Payer: No Typology Code available for payment source

## 2021-04-10 ENCOUNTER — Encounter (HOSPITAL_COMMUNITY): Payer: Self-pay | Admitting: Internal Medicine

## 2021-04-10 DIAGNOSIS — J9611 Chronic respiratory failure with hypoxia: Secondary | ICD-10-CM

## 2021-04-10 DIAGNOSIS — J449 Chronic obstructive pulmonary disease, unspecified: Secondary | ICD-10-CM

## 2021-04-10 DIAGNOSIS — R778 Other specified abnormalities of plasma proteins: Secondary | ICD-10-CM

## 2021-04-10 LAB — GLUCOSE, CAPILLARY
Glucose-Capillary: 271 mg/dL — ABNORMAL HIGH (ref 70–99)
Glucose-Capillary: 331 mg/dL — ABNORMAL HIGH (ref 70–99)
Glucose-Capillary: 463 mg/dL — ABNORMAL HIGH (ref 70–99)
Glucose-Capillary: 312 mg/dL — ABNORMAL HIGH (ref 70–99)

## 2021-04-10 LAB — PROCALCITONIN: Procalcitonin: <0.1 ng/mL

## 2021-04-10 LAB — BASIC METABOLIC PANEL
Anion gap: 16 — ABNORMAL HIGH (ref 5–15)
BUN: 20 mg/dL (ref 8–23)
CO2: 23 mmol/L (ref 22–32)
Calcium: 8.6 mg/dL — ABNORMAL LOW (ref 8.9–10.3)
Chloride: 98 mmol/L (ref 98–111)
Creatinine, Ser: 1.41 mg/dL — ABNORMAL HIGH (ref 0.61–1.24)
GFR, Estimated: 52 mL/min — ABNORMAL LOW (ref >60)
Glucose, Bld: 282 mg/dL — ABNORMAL HIGH (ref 70–99)
Potassium: 4.1 mmol/L (ref 3.5–5.1)
Sodium: 137 mmol/L (ref 135–145)

## 2021-04-10 LAB — CBC
HCT: 33.6 % — ABNORMAL LOW (ref 39.0–52.0)
Hemoglobin: 10.4 g/dL — ABNORMAL LOW (ref 13.0–17.0)
MCH: 27 pg (ref 26.0–34.0)
MCHC: 31 g/dL (ref 30.0–36.0)
MCV: 87.3 fL (ref 80.0–100.0)
Platelets: 417 10*3/uL — ABNORMAL HIGH (ref 150–400)
RBC: 3.85 MIL/uL — ABNORMAL LOW (ref 4.22–5.81)
RDW: 21.7 % — ABNORMAL HIGH (ref 11.5–15.5)
WBC: 5.6 10*3/uL (ref 4.0–10.5)
nRBC: 0.7 % — ABNORMAL HIGH (ref 0.0–0.2)

## 2021-04-10 LAB — BRAIN NATRIURETIC PEPTIDE: B Natriuretic Peptide: 290.4 pg/mL — ABNORMAL HIGH (ref 0.0–100.0)

## 2021-04-10 LAB — TROPONIN I (HIGH SENSITIVITY): Troponin I (High Sensitivity): 96 ng/L — ABNORMAL HIGH (ref <18)

## 2021-04-10 MED ORDER — GLUCERNA SHAKE PO LIQD
237.0000 mL | Freq: Three times a day (TID) | ORAL | Status: DC
  Administered 2021-04-10 – 2021-04-13 (×8): 237 mL via ORAL
  Filled 2021-04-10: qty 237
  Filled 2021-04-10: qty 474

## 2021-04-10 MED ORDER — INSULIN ASPART 100 UNIT/ML ~~LOC~~ SOLN
0.0000 [IU] | Freq: Three times a day (TID) | SUBCUTANEOUS | Status: DC
  Administered 2021-04-10: 15 [IU] via SUBCUTANEOUS
  Administered 2021-04-11: 8 [IU] via SUBCUTANEOUS
  Administered 2021-04-11: 15 [IU] via SUBCUTANEOUS
  Administered 2021-04-11: 5 [IU] via SUBCUTANEOUS
  Administered 2021-04-12: 3 [IU] via SUBCUTANEOUS
  Administered 2021-04-12: 5 [IU] via SUBCUTANEOUS
  Administered 2021-04-12: 3 [IU] via SUBCUTANEOUS
  Administered 2021-04-13 (×2): 2 [IU] via SUBCUTANEOUS
  Administered 2021-04-13: 5 [IU] via SUBCUTANEOUS
  Administered 2021-04-14 (×2): 3 [IU] via SUBCUTANEOUS

## 2021-04-10 MED ORDER — ADULT MULTIVITAMIN W/MINERALS CH
1.0000 | ORAL_TABLET | Freq: Every day | ORAL | Status: DC
  Administered 2021-04-10 – 2021-04-13 (×4): 1 via ORAL
  Filled 2021-04-10 (×4): qty 1

## 2021-04-10 MED ORDER — INSULIN ASPART 100 UNIT/ML ~~LOC~~ SOLN
8.0000 [IU] | Freq: Three times a day (TID) | SUBCUTANEOUS | Status: DC
  Administered 2021-04-11 – 2021-04-14 (×11): 8 [IU] via SUBCUTANEOUS

## 2021-04-10 MED ORDER — METHYLPREDNISOLONE SODIUM SUCC 40 MG IJ SOLR
40.0000 mg | Freq: Two times a day (BID) | INTRAMUSCULAR | Status: DC
  Administered 2021-04-10 – 2021-04-11 (×2): 40 mg via INTRAVENOUS
  Filled 2021-04-10 (×2): qty 1

## 2021-04-10 MED ORDER — INSULIN GLARGINE 100 UNIT/ML ~~LOC~~ SOLN
5.0000 [IU] | Freq: Every day | SUBCUTANEOUS | Status: DC
  Administered 2021-04-10: 5 [IU] via SUBCUTANEOUS
  Filled 2021-04-10 (×3): qty 0.05

## 2021-04-10 MED ORDER — BISOPROLOL FUMARATE 5 MG PO TABS
10.0000 mg | ORAL_TABLET | Freq: Two times a day (BID) | ORAL | Status: DC
  Administered 2021-04-10 – 2021-04-14 (×8): 10 mg via ORAL
  Filled 2021-04-10 (×8): qty 2

## 2021-04-10 MED ORDER — INSULIN ASPART 100 UNIT/ML ~~LOC~~ SOLN
0.0000 [IU] | Freq: Every day | SUBCUTANEOUS | Status: DC
  Administered 2021-04-10 – 2021-04-11 (×2): 4 [IU] via SUBCUTANEOUS
  Administered 2021-04-12: 3 [IU] via SUBCUTANEOUS

## 2021-04-10 MED ORDER — MAGNESIUM SULFATE 2 GM/50ML IV SOLN
2.0000 g | Freq: Once | INTRAVENOUS | Status: AC
  Administered 2021-04-10: 2 g via INTRAVENOUS
  Filled 2021-04-10: qty 50

## 2021-04-10 NOTE — Progress Notes (Signed)
Inpatient Diabetes Program Recommendations  AACE/ADA: New Consensus Statement on Inpatient Glycemic Control   Target Ranges:  Prepandial:   less than 140 mg/dL      Peak postprandial:   less than 180 mg/dL (1-2 hours)      Critically ill patients:  140 - 180 mg/dL   Results for TRES, GRZYWACZ (MRN [id number]) as of 04/10/2021 14:55  Ref. Range 04/09/2021 23:37 04/10/2021 07:52 04/10/2021 12:11  Glucose-Capillary Latest Ref Range: 70 - 99 mg/dL 228 (H) 271 (H) 331 (H)   Results for TEMILOLUWA, LAREDO (MRN [id number]) as of 04/10/2021 14:55  Ref. Range 01/07/2021 20:06 03/15/2021 02:58  Hemoglobin A1C Latest Ref Range: 4.8 - 5.6 % 6.4 (H) 6.9 (H)   Review of Glycemic Control  Diabetes history: DM2 Outpatient Diabetes medications: Jardiance 10 mg QAM, Metformin 1000 BID Current orders for Inpatient glycemic control: Novolog 0-15 units TID with meals, Novolog 0-5 units QHS  Inpatient Diabetes Program Recommendations:   Insulin: If steroids are continued as ordered, please consider ordering Lantus 10 units Q24H and adding Novolog 3 units TID with meals for meal coverage.  Thanks, Barnie Alderman, RN, MSN, CDE Diabetes Coordinator Inpatient Diabetes Program [phone] (Team Pager from 8am to 5pm)

## 2021-04-10 NOTE — Evaluation (Signed)
Physical Therapy Evaluation Patient Details Name: Brandon [name] MRN: [id number] DOB: [birth year] Today's Date: 04/10/2021   History of Present Illness  Brandon [name] is a 75 y.o. male who presented with increasing shortness of breath and cough.  PMH: COPD, 4.5-5 L baseline, chronic diastolic CHF, PVD, IDDM, history of noncompliant with home medications  Clinical Impression  Pt admitted with above diagnosis. Pt was able to take a few steps to the recliner with supervision to min guard assist with RW with good balance. Pt limited by incr HR and desaturation to 85% on 8LHFNC with minimal activity.  Pt needs incr time to sit and recover. Pt currently with functional limitations due to the deficits listed below (see PT Problem List). Pt will benefit from skilled PT to increase their independence and safety with mobility to allow discharge to the venue listed below.      Follow Up Recommendations Home health PT    Equipment Recommendations  None recommended by PT    Recommendations for Other Services       Precautions / Restrictions Precautions Precautions: Fall Precaution Comments: watch O2, 4/5 L at baseline Restrictions Weight Bearing Restrictions: No      Mobility  Bed Mobility Overal bed mobility: Needs Assistance Bed Mobility: Supine to Sit     Supine to sit: Supervision;HOB elevated          Transfers Overall transfer level: Needs assistance Equipment used: Rolling walker (2 wheeled) Transfers: Sit to/from [facility] Sit to Stand: Supervision Stand pivot transfers: Supervision       General transfer comment: No physical assist.  Cues for hand placement and pursed lip breathing.  Ambulation/Gait Ambulation/Gait assistance: Supervision;Min guard Gait Distance (Feet): 4 Feet Assistive device: Rolling walker (2 wheeled) Gait Pattern/deviations: Step-through pattern;Decreased stride length;Trunk flexed Gait velocity: decreased Gait velocity  interpretation: <1.31 ft/sec, indicative of household ambulator General Gait Details: Min guard for safety, no LOB, cueing for pacing and navigation with RW. Short distance due to incr HR with activity to 145 bpm and sats to 85% on 8L with transfer to chair only. Sats quickly rebounded once pt sat and pursed lip breathing.  Stairs            Wheelchair Mobility    Modified Rankin (Stroke Patients Only)       Balance Overall balance assessment: Needs assistance Sitting-balance support: Feet supported;No upper extremity supported Sitting balance-Leahy Scale: Good Sitting balance - Comments: sitting EOB with supervision   Standing balance support: No upper extremity supported;Single extremity supported;During functional activity Standing balance-Leahy Scale: Poor Standing balance comment: Reliant on B UE support, especially during transfers and ambulation                             Pertinent Vitals/Pain Pain Assessment: No/denies pain    Home Living Family/patient expects to be discharged to:: Private residence Living Arrangements: Alone Available Help at Discharge: Family;Available PRN/intermittently;Friend(s) (daughter and neighbors) Type of Home: House Home Access: Stairs to enter Entrance Stairs-Rails: Right;Left;Can reach both Entrance Stairs-Number of Steps: 3 Home Layout: One level Home Equipment: Grab bars - tub/shower;Shower seat;Cane - single point;Walker - 2 wheels;Walker - 4 wheels (4.5- 5L home O2 since Jan 2021) Additional Comments: daughter still working on getting pt a wheelchair    Prior Function Level of Independence: Needs assistance   Gait / Transfers Assistance Needed: using walker PTA, denies falls  ADL's / Homemaking Assistance Needed: requires assist for shower transfers.  daughter visits every week and assists with IADL's  Comments: Drives. No falls. Does not cook much. Wears 3.5 L 02 at home     Hand Dominance   Dominant Hand:  Right    Extremity/Trunk Assessment   Upper Extremity Assessment Upper Extremity Assessment: Defer to OT evaluation    Lower Extremity Assessment Lower Extremity Assessment: Generalized weakness    Cervical / Trunk Assessment Cervical / Trunk Assessment: Kyphotic  Communication   Communication: HOH  Cognition Arousal/Alertness: Awake/alert Behavior During Therapy: WFL for tasks assessed/performed Overall Cognitive Status: Within Functional Limits for tasks assessed                                 General Comments: A&Ox4. Cooperative, Responds appropriately with tangential answers      General Comments General comments (skin integrity, edema, etc.): Pt on 8L HFNC with sats to 85% after a short distance pivot to recliner. Recovered with rest to >90% on 8LHFNC. HR to 145 bpm as well therefore did not progress ambulation more.    Exercises General Exercises - Lower Extremity Ankle Circles/Pumps: AROM;Both;5 reps;Seated Long Arc Quad: AROM;Both;5 reps;Seated   Assessment/Plan    PT Assessment    PT Problem List         PT Treatment Interventions      PT Goals (Current goals can be found in the Care Plan section)  Acute Rehab PT Goals PT Goal Formulation: With patient Time For Goal Achievement: 04/24/21 Potential to Achieve Goals: Good    Frequency Min 3X/week   Barriers to discharge        Co-evaluation               AM-PAC PT "6 Clicks" Mobility  Outcome Measure Help needed turning from your back to your side while in a flat bed without using bedrails?: None Help needed moving from lying on your back to sitting on the side of a flat bed without using bedrails?: None Help needed moving to and from a bed to a chair (including a wheelchair)?: A Little Help needed standing up from a chair using your arms (e.g., wheelchair or bedside chair)?: A Little Help needed to walk in hospital room?: A Little Help needed climbing 3-5 steps with a railing?  : A Little 6 Click Score: 20    End of Session Equipment Utilized During Treatment: Oxygen;Gait belt Activity Tolerance: Patient tolerated treatment well Patient left: in chair;with call bell/phone within reach;with chair alarm set Nurse Communication: Mobility status PT Visit Diagnosis: Unsteadiness on feet (R26.81);Difficulty in walking, not elsewhere classified (R26.2)    Time: 6629-4765 PT Time Calculation (min) (ACUTE ONLY): 24 min   Charges:   PT Evaluation $PT Eval Moderate Complexity: 1 Mod PT Treatments $Therapeutic Activity: 8-22 mins        Leighanne Adolph M,PT Acute Rehab Services [phone] (pager)  Alvira Philips 04/10/2021, 3:01 PM

## 2021-04-10 NOTE — Consult Note (Signed)
NAME:  Brandon [name], MRN:  [id number], DOB:  [birth year], LOS: 1 ADMISSION DATE:  04/09/2021, CONSULTATION DATE:  04/10/21 REFERRING MD:  Eliseo Squires - TRH, CHIEF COMPLAINT:  Chronic hypoxic respiratory failure  History of Present Illness:  75 yo PMH COPD followed at the [place], chronic hypoxic respiratory failure on 2.5LNC, chronic diastolic heart failure, PVD, CAD who presented to ED 4/10 with CC SOB. Taken to ED by EMS who noted pts tachypnea and hypoxia requiring 5L O2. No associated chest pain, URI sx, fever, chills. In ED CXR most consistent with pulmonary edema. Pt placed on BiPAP, admitted to [facility] for suspected heart failure and COPD exacerbation.  Pulmonary consulted for consideration of possible home BiPAP utility   Pertinent  Medical History  COPD PAD PVD  Chronic diastolic heart failure   Significant Hospital Events: Including procedures, antibiotic start and stop dates in addition to other pertinent events   . 4/10 admitted to [facility]. Started on doxy, solumedrol, lasix  . 4/11 pulm consult   Interim History / Subjective:  8 HFNC this morning, SpO2 100%  Says he feels better today  Overall he is pretty sad that he has had so many health problems this year  Objective   Blood pressure 115/73, pulse 100, temperature 97.8 F (36.6 C), temperature source Oral, resp. rate 18, weight 74.4 kg, SpO2 100 %.    FiO2 (%):  [40 %] 40 %  No intake or output data in the 24 hours ending 04/10/21 1042 Filed Weights   04/09/21 2224  Weight: 74.4 kg    Examination: General: Chronically ill older adult M seated bedside eating NAD HENT: NCAT pink mm Lungs: No wheeze. Symmetrical chest expansion, even and unlabored.  Cardiovascular: rrr cap refill brisk  Abdomen: soft round + bowel sounds Extremities: BLE trace pedal edema. No acute joint deformity Neuro: AAOx3 following commands  GU: defer Skin: BLE with chronic appearing dry skin  Labs/imaging that I havepersonally reviewed  (right click and  "Reselect all SmartList Selections" daily)  CXR 4/11- bilateral opacities   4/11- WBC 5.6. Cr 1.41  Resolved Hospital Problem list     Assessment & Plan:   Acute on chronic respiratory failure  COPD -follows at the [facility] for this  -home 2.5L, on 8 HFNC today -pulmonary edema (hx diastolic CHF) vs multifocal pna. In talking with the pt it sounds like he has had some sx of exacerbations of his HF, describing intermittent pitting edema and SOB with this, no cough no wheeze  P -I will discuss with Dr. Lamonte Sakai RE question of home BiPAP -- I am not sure if he would benefit from this or not, and wonder if it might be better assessed after improvement with confounding respiratory factors  -check BNP, PCT  - on doxy - on 40mg  lasix BID (started 4/11) -getting 40mg  solumedrol q6hr -- clinical features 4/11 aren't hugely suspicious for AECOPD and I would favor de-escalation.  -cont BDs  -pulm hygiene  -wean SpO2 as able -- goal 88-92%   Best practice (right click and "Reselect all SmartList Selections" daily)  Diet:  Oral Pain/Anxiety/Delirium protocol (if indicated): No VAP protocol (if indicated): Not indicated DVT prophylaxis: LMWH GI prophylaxis: PPI Glucose control:  SSI Yes Central venous access:  N/A Arterial line:  N/A Foley:  N/A Mobility:  OOB  PT consulted: N/A Last date of multidisciplinary goals of care discussion [per primary] Code Status:  DNR Disposition: SDU  Labs   CBC: Recent Labs  Lab 04/09/21 1521  04/10/21 0153  WBC 8.2 5.6  HGB 11.3* 10.4*  HCT 36.7* 33.6*  MCV 88.2 87.3  PLT 451* 417*    Basic Metabolic Panel: Recent Labs  Lab 04/09/21 1521 04/09/21 1927 04/10/21 0153  NA 137  --  137  K 3.2*  --  4.1  CL 97*  --  98  CO2 27  --  23  GLUCOSE 100*  --  282*  BUN 18  --  20  CREATININE 1.44*  --  1.41*  CALCIUM 8.9  --  8.6*  MG  --  1.8  --   PHOS  --  3.5  --    GFR: Estimated Creatinine Clearance: 47.6 mL/min (A) (by C-G formula based on  SCr of 1.41 mg/dL (H)). Recent Labs  Lab 04/09/21 1521 04/09/21 1927 04/10/21 0153  WBC 8.2  --  5.6  LATICACIDVEN 3.2* 2.9*  --     Liver Function Tests: Recent Labs  Lab 04/09/21 1521  AST 16  ALT 16  ALKPHOS 63  BILITOT 0.9  PROT 6.4*  ALBUMIN 2.7*   No results for input(s): LIPASE, AMYLASE in the last 168 hours. No results for input(s): AMMONIA in the last 168 hours.  ABG    Component Value Date/Time   PHART 7.447 02/02/2021 0821   PCO2ART 30.5 (L) 02/02/2021 0821   PO2ART 55 (L) 02/02/2021 0821   HCO3 22.3 02/19/2021 1926   TCO2 29 02/19/2021 0842   ACIDBASEDEF 2.1 (H) 02/19/2021 1926   O2SAT 85.2 02/19/2021 1926     Coagulation Profile: No results for input(s): INR, PROTIME in the last 168 hours.  Cardiac Enzymes: No results for input(s): CKTOTAL, CKMB, CKMBINDEX, TROPONINI in the last 168 hours.  HbA1C: Hgb A1c MFr Bld  Date/Time Value Ref Range Status  03/15/2021 02:58 AM 6.9 (H) 4.8 - 5.6 % Final    Comment:    (NOTE) Pre diabetes:          5.7%-6.4%  Diabetes:              >6.4%  Glycemic control for   <7.0% adults with diabetes   01/07/2021 08:06 PM 6.4 (H) 4.8 - 5.6 % Final    Comment:    (NOTE) Pre diabetes:          5.7%-6.4%  Diabetes:              >6.4%  Glycemic control for   <7.0% adults with diabetes     CBG: Recent Labs  Lab 04/09/21 2337 04/10/21 0752  GLUCAP 228* 271*    Review of Systems:   Review of Systems  Constitutional: Negative.   HENT: Negative.   Eyes: Negative.   Respiratory: Positive for shortness of breath. Negative for cough, hemoptysis, sputum production and wheezing.   Cardiovascular: Positive for leg swelling. Negative for chest pain, palpitations, orthopnea, claudication and PND.  Gastrointestinal: Negative.   Genitourinary: Negative.   Musculoskeletal: Negative.   Skin: Negative.   Neurological: Negative.   Endo/Heme/Allergies: Negative.   Psychiatric/Behavioral: Negative.      Past  Medical History:  He,  has a past medical history of Abdominal aortic aneurysm without rupture ([place]), CAD (coronary artery disease), Chronic alcoholism ([place]), Chronic idiopathic constipation, Chronic pancreatitis ([place]), Chronic respiratory failure with hypoxia ([place]), Essential hypertension, GERD (gastroesophageal reflux disease), Hyperaldosteronism ([place]), Malignant neoplasm of prostate ([place]), Nicotine dependence, Paroxysmal atrial tachycardia ([place]), Peripheral vascular disease ([place]), Serrated polyp of colon, and Type 2 diabetes mellitus ([place]).   Surgical History:  Past Surgical History:  Procedure Laterality Date  . CORONARY ATHERECTOMY N/A 01/11/2021   Procedure: CORONARY ATHERECTOMY;  Surgeon: Sherren Mocha, MD;  Location: [facility];  Service: Cardiovascular;  Laterality: N/A;  . CORONARY ATHERECTOMY N/A 01/13/2021   Procedure: CORONARY ATHERECTOMY;  Surgeon: Martinique, Peter M, MD;  Location: [facility];  Service: Cardiovascular;  Laterality: N/A;  . CORONARY STENT INTERVENTION N/A 01/13/2021   Procedure: CORONARY STENT INTERVENTION;  Surgeon: Martinique, Peter M, MD;  Location: [facility];  Service: Cardiovascular;  Laterality: N/A;  . INTRAVASCULAR ULTRASOUND/IVUS N/A 01/13/2021   Procedure: Intravascular Ultrasound/IVUS;  Surgeon: Martinique, Peter M, MD;  Location: [facility];  Service: Cardiovascular;  Laterality: N/A;  . PROSTATECTOMY  12/2012  . RIGHT/LEFT HEART CATH AND CORONARY ANGIOGRAPHY N/A 01/11/2021   Procedure: RIGHT/LEFT HEART CATH AND CORONARY ANGIOGRAPHY;  Surgeon: Sherren Mocha, MD;  Location: [facility];  Service: Cardiovascular;  Laterality: N/A;     Social History:   reports that he quit smoking about 6 months ago. His smoking use included cigarettes. He has a 30.00 pack-year smoking history. He has never used smokeless tobacco. He reports previous alcohol use. He reports that he does not use drugs.   Family History:  His family history  includes Hypertension in his father and mother.   Allergies Allergies  Allergen Reactions  . Hydrochlorothiazide Other (See Comments)    Hypokalemia (Pt claims not allergic 04/09/21)     Home Medications  Prior to Admission medications   Medication Sig Start Date End Date Taking? Authorizing Provider  albuterol (VENTOLIN HFA) 108 (90 Base) MCG/ACT inhaler Inhale 2 puffs into the lungs every 4 (four) hours as needed for wheezing or shortness of breath. 11/24/19  Yes [provider]  aspirin 81 MG EC tablet Take 1 tablet (81 mg total) by mouth daily. 10/21/20  Yes Little Ishikawa, MD  atorvastatin (LIPITOR) 40 MG tablet Take 40 mg by mouth at bedtime.  06/11/19  Yes [provider]  bisoprolol (ZEBETA) 5 MG tablet Take 1 tablet (5 mg total) by mouth daily. 01/14/21  Yes Sheikh, Omair Latif, DO  clopidogrel (PLAVIX) 75 MG tablet Take 1 tablet (75 mg total) by mouth daily. 10/22/20  Yes Little Ishikawa, MD  diltiazem (CARDIZEM CD) 240 MG 24 hr capsule Take 1 capsule (240 mg total) by mouth daily. 01/14/21  Yes Sheikh, Omair Latif, DO  empagliflozin (JARDIANCE) 10 MG TABS tablet Take 1 tablet (10 mg total) by mouth daily before breakfast. 03/10/21  Yes Vann, Jessica U, DO  feeding supplement, GLUCERNA SHAKE, (GLUCERNA SHAKE) LIQD Take 237 mLs by mouth 2 (two) times daily between meals. 01/14/21  Yes Sheikh, Omair Latif, DO  Fluticasone-Salmeterol (ADVAIR) 250-50 MCG/DOSE AEPB Inhale 1 puff into the lungs 2 (two) times daily.   Yes [provider]  furosemide (LASIX) 40 MG tablet Take 1 tablet (40 mg total) by mouth 2 (two) times daily. 02/24/21  Yes Vann, Jessica U, DO  insulin aspart protamine - aspart (NOVOLOG 70/30 MIX) (70-30) 100 UNIT/ML FlexPen Inject 0.2 mLs (20 Units total) into the skin 2 (two) times daily with a meal. 01/14/21  Yes Sheikh, Omair Latif, DO  metFORMIN (GLUCOPHAGE) 1000 MG tablet Take by mouth 2 (two) times daily with a meal.   Yes [provider]  Multiple Vitamin (MULTIVITAMIN WITH MINERALS) TABS tablet Take 1 tablet by mouth daily. 01/14/21  Yes Sheikh, Omair Latif, DO  naproxen sodium (ALEVE) 220  MG tablet Take 440 mg by mouth daily as needed (pain).   Yes [provider]  nortriptyline (PAMELOR) 50 MG capsule Take 50 mg by mouth at bedtime. For restless leg symdrome   Yes [provider]  Omega-3 1000 MG CAPS Take 1,000 mg by mouth in the morning and at bedtime.    Yes [provider]  pantoprazole (PROTONIX) 40 MG tablet Take 1 tablet (40 mg total) by mouth daily. Patient taking differently: Take 40 mg by mouth daily before breakfast. 01/14/21  Yes Sheikh, Omair Latif, DO  polyethylene glycol (MIRALAX / GLYCOLAX) 17 g packet Take 17 g by mouth daily as needed (constipation). Mix with water or other liquid as instructed before drinking   Yes [provider]  potassium chloride (KLOR-CON) 10 MEQ tablet Take 2 tablets (20 mEq total) by mouth daily. Patient taking differently: Take 10 mEq by mouth daily. 02/24/21  Yes Vann, Jessica U, DO  senna-docusate (SENOKOT-S) 8.6-50 MG tablet Take 1 tablet by mouth at bedtime as needed for mild constipation. Patient taking differently: Take 1 tablet by mouth daily at 6 (six) AM. 01/14/21  Yes Raiford Noble Latif, DO  Tiotropium Bromide Monohydrate 2.5 MCG/ACT AERS Inhale 2 puffs into the lungs daily.   Yes [provider]  empagliflozin (JARDIANCE) 10 MG TABS tablet TAKE 1 TABLET (10 MG TOTAL) BY MOUTH DAILY. Patient not taking: No sig reported 02/24/21 02/24/22  Geradine Girt, DO  furosemide (LASIX) 40 MG tablet TAKE 1 TABLET (40 MG TOTAL) BY MOUTH TWO TIMES DAILY. Patient not taking: No sig reported 02/24/21 02/24/22  Geradine Girt, DO  Magnesium Oxide 420 MG TABS Take 420 mg by mouth daily.    [provider]  OXYGEN Inhale 4.5 L/min into the lungs continuous.    [provider]  potassium chloride (KLOR-CON) 10 MEQ tablet  TAKE 2 TABLETS (20 MEQ TOTAL) BY MOUTH DAILY. Patient not taking: No sig reported 02/24/21 02/24/22  Geradine Girt, DO    Eliseo Gum MSN, AGACNP-BC [place] Please see Amion for pager details  04/10/2021, 12:52 PM

## 2021-04-10 NOTE — Progress Notes (Signed)
Initial Nutrition Assessment  DOCUMENTATION CODES:  Non-severe (moderate) malnutrition in context of chronic illness  INTERVENTION:  Add Glucerna Shake po TID, each supplement provides 220 kcal and 10 grams of protein.  Add snacks TID.  Add MVI with minerals daily.   NUTRITION DIAGNOSIS:  Moderate Malnutrition related to chronic illness (COPD, CHF, PVD, T2DM, noncompliance of meds) as evidenced by moderate fat depletion,severe fat depletion,moderate muscle depletion,severe muscle depletion.  GOAL:  Patient will meet greater than or equal to 90% of their needs  MONITOR:  PO intake,Supplement acceptance,Labs,Weight trends,Skin  REASON FOR ASSESSMENT:  Consult Assessment of nutrition requirement/status  ASSESSMENT:  75 yo male with a PMH of COPD, chronic hypoxic respiratory on 2.5 L baseline, chronic diastolic CHF, PVD, E2CM with insulin use, and history of noncompliance with home medications who presents with COPD exacerbation, AKI, and acute on chronic CHF.  Attempted to speak with pt at bedside. Pt had just finished patient care and seemed a little shocked when RD visited. He said he was eating just fine at home. He would not elaborate coherently what that meant. Per Epic, ate 75% of his breakfast this morning.  He denies any weight loss or appetite changes also. Per Epic, pt's weight fluctuates rather consistently. Pt also has edema, indicating fluid accumulation. Some depletion may be masked by fluid. On exam, pt appeared to have some losses in both fat and muscle.  Recommend continuing Glucerna TID, though pt does not like it, he will drink it. Also recommend adding snacks TID, as pt not interested in other supplements at this time. Add MVI with minerals daily.  Relevant Medications: lasix BID, SSI, Mag-Ox, Protonix, Solu-Medrol, [place]-Con 20 mEq, Mag Sulfate in IV once Labs: reviewed; CBG 228-441 HbA1c: 6.9% (02/2021)  NUTRITION - FOCUSED PHYSICAL EXAM: Flowsheet Row Most  Recent Value  Orbital Region Moderate depletion  Upper Arm Region Severe depletion  Thoracic and Lumbar Region No depletion  Buccal Region Moderate depletion  Temple Region Severe depletion  Clavicle Bone Region Mild depletion  Clavicle and Acromion Bone Region Moderate depletion  Scapular Bone Region No depletion  Dorsal Hand Moderate depletion  Patellar Region Moderate depletion  Anterior Thigh Region Moderate depletion  Posterior Calf Region Moderate depletion  Edema (RD Assessment) Mild  Hair Reviewed  Eyes Reviewed  Mouth Reviewed  Skin Reviewed  Nails Reviewed     Diet Order:   Diet Order            Diet heart healthy/carb modified Room service appropriate? Yes; Fluid consistency: Thin; Fluid restriction: 2000 mL Fluid  Diet effective now                EDUCATION NEEDS:  Education needs have been addressed  Skin:  Skin Assessment: Skin Integrity Issues: Skin Integrity Issues:: Stage II Stage II: L ear  Last BM:  04/09/21  Height:  Ht Readings from Last 1 Encounters:  03/13/21 6' (1.829 m)   Weight:  Wt Readings from Last 1 Encounters:  04/09/21 74.4 kg   Ideal Body Weight:  81 kg  BMI:  Body mass index is 22.25 kg/m.  Estimated Nutritional Needs:  Kcal:  2050-2250 Protein:  100-115 grams Fluid:  2000 ml fluid restriction  Derrel Nip, RD, LDN Registered Dietitian After Hours/Weekend Pager # in [place]

## 2021-04-10 NOTE — Progress Notes (Signed)
Patient having runs SVT. Gertie Baron, MD.

## 2021-04-10 NOTE — Progress Notes (Signed)
Progress Note    Brandon [name]  EXB:[id number] DOB: 05-22-1946  DOA: 04/09/2021 PCP: Pcp, No    Brief Narrative:     Medical records reviewed and are as summarized below:  Brandon [name] is an 75 y.o. male with medical history significant of COPD, chronic hypoxic respiratory on 2.5 L baseline, chronic diastolic CHF, PVD, IDDM, history of noncompliant with home medications, presented with increasing shortness of breath and cough.   Assessment/Plan:   Active Problems:   COPD with acute exacerbation (HCC)   COPD (chronic obstructive pulmonary disease) (HCC)   Acute COPD exacerbation -Solu-Medrol-- wean off, breathing treatment -on home dose of O2 4-5L -doxycycline -pulm consulted in ER  Acute on chronic chronic diastolic CHF -Signs of fluid overload per admitter -D/C Cardizem per admitter and heart rate control. -Increase Lasix -Repeat chest x-ray shows: Interval worsening of diffuse bilateral airspace opacities which may be due to CHF/fluid volume overload or multifocal pneumonia. -check BNP and procalcitonin  AKI  -on diuretics -check BNP -daily labs -strict I/o  Troponins elevation -Denied any chest pains, suspect troponin elevation secondary to AKI and demanding ischemia from COPD exacerbation -trending down  Elevated lactic acid level -recheck in AM  Sinus arrhythmia -Similar EKG presentation as compared to last month. -episodes of SVT- was taken off cardizem at admission  DM type 2 -D/C Metformin - Sliding scale  CAD -Continue aspirin, Plavix and statin   Was seen by palliative care last admission but it appears that outpatient palliative care was not ordered at d/c  Family Communication/Anticipated D/C date and plan/Code Status   DVT prophylaxis: Lovenox ordered. Code Status: Full Code.  Disposition Plan: Status is: Inpatient  Remains inpatient appropriate because:Inpatient level of care appropriate due to severity of  illness   Dispo: The patient is from: Home?              Anticipated d/c is to: tbd              Patient currently is not medically stable to d/c.   Difficult to place patient No         Medical Consultants:    pulm  Subjective:   Wants to stay out of the hospital  Objective:    Vitals:   04/10/21 0750 04/10/21 0800 04/10/21 1200 04/10/21 1240  BP:  115/73 113/71   Pulse: (!) 105 100 (!) 102   Resp: (!) 22 18 20    Temp:  97.8 F (36.6 C) 97.9 F (36.6 C) 97.9 F (36.6 C)  TempSrc:  Oral Oral Oral  SpO2: 97% 100% 95%   Weight:        Intake/Output Summary (Last 24 hours) at 04/10/2021 1325 Last data filed at 04/10/2021 1027 Gross per 24 hour  Intake 480 ml  Output --  Net 480 ml   Filed Weights   04/09/21 2224  Weight: 74.4 kg    Exam: In chair, eating in NAD [place], no increased work of breathing, no wheezing Pleasant and cooperative Min LE edema   Data Reviewed:   I have personally reviewed following labs and imaging studies:  Labs: Labs show the following:   Basic Metabolic Panel: Recent Labs  Lab 04/09/21 1521 04/09/21 1927 04/10/21 0153  NA 137  --  137  K 3.2*  --  4.1  CL 97*  --  98  CO2 27  --  23  GLUCOSE 100*  --  282*  BUN 18  --  20  CREATININE 1.44*  --  1.41*  CALCIUM 8.9  --  8.6*  MG  --  1.8  --   PHOS  --  3.5  --    GFR Estimated Creatinine Clearance: 47.6 mL/min (A) (by C-G formula based on SCr of 1.41 mg/dL (H)). Liver Function Tests: Recent Labs  Lab 04/09/21 1521  AST 16  ALT 16  ALKPHOS 63  BILITOT 0.9  PROT 6.4*  ALBUMIN 2.7*   No results for input(s): LIPASE, AMYLASE in the last 168 hours. No results for input(s): AMMONIA in the last 168 hours. Coagulation profile No results for input(s): INR, PROTIME in the last 168 hours.  CBC: Recent Labs  Lab 04/09/21 1521 04/10/21 0153  WBC 8.2 5.6  HGB 11.3* 10.4*  HCT 36.7* 33.6*  MCV 88.2 87.3  PLT 451* 417*   Cardiac Enzymes: No results for  input(s): CKTOTAL, CKMB, CKMBINDEX, TROPONINI in the last 168 hours. BNP (last 3 results) No results for input(s): PROBNP in the last 8760 hours. CBG: Recent Labs  Lab 04/09/21 2337 04/10/21 0752 04/10/21 1211  GLUCAP 228* 271* 331*   D-Dimer: No results for input(s): DDIMER in the last 72 hours. Hgb A1c: No results for input(s): HGBA1C in the last 72 hours. Lipid Profile: No results for input(s): CHOL, HDL, LDLCALC, TRIG, CHOLHDL, LDLDIRECT in the last 72 hours. Thyroid function studies: No results for input(s): TSH, T4TOTAL, T3FREE, THYROIDAB in the last 72 hours.  Invalid input(s): FREET3 Anemia work up: No results for input(s): VITAMINB12, FOLATE, FERRITIN, TIBC, IRON, RETICCTPCT in the last 72 hours. Sepsis Labs: Recent Labs  Lab 04/09/21 1521 04/09/21 1927 04/10/21 0153  WBC 8.2  --  5.6  LATICACIDVEN 3.2* 2.9*  --     Microbiology Recent Results (from the past 240 hour(s))  Culture, blood (routine x 2)     Status: None (Preliminary result)   Collection Time: 04/09/21  3:22 PM   Specimen: BLOOD  Result Value Ref Range Status   Specimen Description BLOOD RIGHT ANTECUBITAL  Final   Special Requests   Final    BOTTLES DRAWN AEROBIC AND ANAEROBIC Blood Culture adequate volume   Culture   Final    NO GROWTH < 24 HOURS Performed at [facility], [address], [place], [place] [address]    Report Status PENDING  Incomplete  Resp Panel by RT-PCR (Flu A&B, Covid) Nasopharyngeal Swab     Status: None   Collection Time: 04/09/21  4:11 PM   Specimen: Nasopharyngeal Swab; Nasopharyngeal(NP) swabs in vial transport medium  Result Value Ref Range Status   SARS Coronavirus 2 by RT PCR NEGATIVE NEGATIVE Final    Comment: (NOTE) SARS-CoV-2 target nucleic acids are NOT DETECTED.  The SARS-CoV-2 RNA is generally detectable in upper respiratory specimens during the acute phase of infection. The lowest concentration of SARS-CoV-2 viral copies this assay can detect  is 138 copies/mL. A negative result does not preclude SARS-Cov-2 infection and should not be used as the sole basis for treatment or other patient management decisions. A negative result may occur with  improper specimen collection/handling, submission of specimen other than nasopharyngeal swab, presence of viral mutation(s) within the areas targeted by this assay, and inadequate number of viral copies(<138 copies/mL). A negative result must be combined with clinical observations, patient history, and epidemiological information. The expected result is Negative.  Fact Sheet for Patients:  EntrepreneurPulse.com.au  Fact Sheet for Healthcare Providers:  IncredibleEmployment.be  This test is no t yet approved  or cleared by the [place] and  has been authorized for detection and/or diagnosis of SARS-CoV-2 by FDA under an Emergency Use Authorization (EUA). This EUA will remain  in effect (meaning this test can be used) for the duration of the COVID-19 declaration under Section 564(b)(1) of the Act, 21 U.S.C.section 360bbb-3(b)(1), unless the authorization is terminated  or revoked sooner.       Influenza A by PCR NEGATIVE NEGATIVE Final   Influenza B by PCR NEGATIVE NEGATIVE Final    Comment: (NOTE) The Xpert Xpress SARS-CoV-2/FLU/RSV plus assay is intended as an aid in the diagnosis of influenza from Nasopharyngeal swab specimens and should not be used as a sole basis for treatment. Nasal washings and aspirates are unacceptable for Xpert Xpress SARS-CoV-2/FLU/RSV testing.  Fact Sheet for Patients: EntrepreneurPulse.com.au  Fact Sheet for Healthcare Providers: IncredibleEmployment.be  This test is not yet approved or cleared by the [place] FDA and has been authorized for detection and/or diagnosis of SARS-CoV-2 by FDA under an Emergency Use Authorization (EUA). This EUA will remain in effect  (meaning this test can be used) for the duration of the COVID-19 declaration under Section 564(b)(1) of the Act, 21 U.S.C. section 360bbb-3(b)(1), unless the authorization is terminated or revoked.  Performed at [facility], [place] [address], [place], [place] [address]   Culture, blood (routine x 2)     Status: None (Preliminary result)   Collection Time: 04/09/21  4:13 PM   Specimen: BLOOD  Result Value Ref Range Status   Specimen Description BLOOD SITE NOT SPECIFIED  Final   Special Requests   Final    BOTTLES DRAWN AEROBIC AND ANAEROBIC Blood Culture results may not be optimal due to an inadequate volume of blood received in culture bottles   Culture   Final    NO GROWTH < 24 HOURS Performed at [facility], [place] [address], [place], [place] [address]    Report Status PENDING  Incomplete    Procedures and diagnostic studies:  DG Chest Port 1 View  Result Date: 04/10/2021 CLINICAL DATA:  CHF EXAM: PORTABLE CHEST 1 VIEW COMPARISON:  04/09/2021 FINDINGS: Interval worsening of bilateral airspace opacity. Heart size within normal limits. No significant pulmonary vascular congestion. IMPRESSION: Interval worsening of diffuse bilateral airspace opacities which may be due to CHF/fluid volume overload or multifocal pneumonia. Electronically Signed   By: Miachel Roux M.D.   On: 04/10/2021 07:58   DG Chest Port 1 View  Result Date: 04/09/2021 CLINICAL DATA:  Dyspnea. EXAM: PORTABLE CHEST 1 VIEW COMPARISON:  03/13/2021 FINDINGS: Lungs are hyperexpanded. Interstitial markings are diffusely coarsened with chronic features. Basilar predominant alveolar opacity again noted, less pronounced than on the prior study. No substantial pleural effusion. Cardiopericardial silhouette is at upper limits of normal for size. Bones are diffusely demineralized. IMPRESSION: Emphysema with persistent basilar predominant alveolar opacity, less pronounced than on the prior study. Imaging features suggest  edema superimposed on chronic underlying interstitial lung disease. Electronically Signed   By: Misty Stanley M.D.   On: 04/09/2021 15:58    Medications:   . aspirin EC  81 mg Oral Daily  . atorvastatin  40 mg Oral QHS  . bisoprolol  10 mg Oral Daily  . clopidogrel  75 mg Oral Daily  . doxycycline  100 mg Oral Q12H  . enoxaparin (LOVENOX) injection  40 mg Subcutaneous Q24H  . feeding supplement (GLUCERNA SHAKE)  237 mL Oral BID BM  . fluticasone furoate-vilanterol  1  puff Inhalation Daily  . furosemide  40 mg Intravenous Q12H  . guaiFENesin  1,200 mg Oral BID  . insulin aspart  0-9 Units Subcutaneous TID WC  . ipratropium-albuterol  3 mL Nebulization Q6H  . magnesium oxide  400 mg Oral Daily  . methylPREDNISolone (SOLU-MEDROL) injection  40 mg Intravenous Q6H  . pantoprazole  40 mg Oral QAC breakfast  . potassium chloride  20 mEq Oral Daily  . umeclidinium bromide  1 puff Inhalation Daily   Continuous Infusions: . magnesium sulfate bolus IVPB 2 g (04/10/21 1305)     LOS: 1 day   Geradine Girt  [facility]   How to contact the [facility] Attending or Consulting provider [place] or covering provider during after hours [place], for this patient?  1. Check the care team in [facility] and look for a) attending/consulting TRH provider listed and b) the [facility] team listed 2. Log into [email or web address] and use [place]'s universal password to access. If you do not have the password, please contact the hospital operator. 3. Locate the [facility] provider you are looking for under [facility] and page to a number that you can be directly reached. 4. If you still have difficulty reaching the provider, please page the [facility] (Director on Call) for the Hospitalists listed on amion for assistance.  04/10/2021, 1:25 PM

## 2021-04-10 NOTE — Plan of Care (Signed)
  Problem: Clinical Measurements: Goal: Respiratory complications will improve Outcome: Progressing   Problem: Clinical Measurements: Goal: Cardiovascular complication will be avoided Outcome: Progressing   Problem: Activity: Goal: Risk for activity intolerance will decrease Outcome: Progressing   Problem: Safety: Goal: Ability to remain free from injury will improve Outcome: Progressing   

## 2021-04-11 DIAGNOSIS — I509 Heart failure, unspecified: Secondary | ICD-10-CM | POA: Diagnosis not present

## 2021-04-11 DIAGNOSIS — J449 Chronic obstructive pulmonary disease, unspecified: Secondary | ICD-10-CM | POA: Diagnosis not present

## 2021-04-11 LAB — CBC
HCT: 30.9 % — ABNORMAL LOW (ref 39.0–52.0)
Hemoglobin: 9.6 g/dL — ABNORMAL LOW (ref 13.0–17.0)
MCH: 27.1 pg (ref 26.0–34.0)
MCHC: 31.1 g/dL (ref 30.0–36.0)
MCV: 87.3 fL (ref 80.0–100.0)
Platelets: 441 10*3/uL — ABNORMAL HIGH (ref 150–400)
RBC: 3.54 MIL/uL — ABNORMAL LOW (ref 4.22–5.81)
RDW: 21.5 % — ABNORMAL HIGH (ref 11.5–15.5)
WBC: 11 10*3/uL — ABNORMAL HIGH (ref 4.0–10.5)
nRBC: 0.7 % — ABNORMAL HIGH (ref 0.0–0.2)

## 2021-04-11 LAB — BASIC METABOLIC PANEL
Anion gap: 8 (ref 5–15)
BUN: 28 mg/dL — ABNORMAL HIGH (ref 8–23)
CO2: 28 mmol/L (ref 22–32)
Calcium: 8.8 mg/dL — ABNORMAL LOW (ref 8.9–10.3)
Chloride: 99 mmol/L (ref 98–111)
Creatinine, Ser: 1.56 mg/dL — ABNORMAL HIGH (ref 0.61–1.24)
GFR, Estimated: 46 mL/min — ABNORMAL LOW (ref >60)
Glucose, Bld: 308 mg/dL — ABNORMAL HIGH (ref 70–99)
Potassium: 4.4 mmol/L (ref 3.5–5.1)
Sodium: 135 mmol/L (ref 135–145)

## 2021-04-11 LAB — GLUCOSE, CAPILLARY
Glucose-Capillary: 271 mg/dL — ABNORMAL HIGH (ref 70–99)
Glucose-Capillary: 381 mg/dL — ABNORMAL HIGH (ref 70–99)
Glucose-Capillary: 244 mg/dL — ABNORMAL HIGH (ref 70–99)
Glucose-Capillary: 339 mg/dL — ABNORMAL HIGH (ref 70–99)

## 2021-04-11 MED ORDER — INSULIN GLARGINE 100 UNIT/ML ~~LOC~~ SOLN
10.0000 [IU] | Freq: Every day | SUBCUTANEOUS | Status: DC
  Administered 2021-04-12 (×2): 10 [IU] via SUBCUTANEOUS
  Filled 2021-04-11 (×4): qty 0.1

## 2021-04-11 MED ORDER — FUROSEMIDE 10 MG/ML IJ SOLN: 40.0000 mg | Freq: Every day | INTRAMUSCULAR | Status: DC

## 2021-04-11 MED ORDER — PREDNISONE 20 MG PO TABS
40.0000 mg | ORAL_TABLET | Freq: Every day | ORAL | Status: DC
  Administered 2021-04-12 – 2021-04-14 (×3): 40 mg via ORAL
  Filled 2021-04-11 (×3): qty 2

## 2021-04-11 NOTE — Progress Notes (Addendum)
Progress Note    Brandon [name]  YIR:[id number] DOB: [birth year]  DOA: 04/09/2021 PCP: Pcp, No    Brief Narrative:     Medical records reviewed and are as summarized below:  Brandon [name] is an 75 y.o. male with medical history significant of COPD, chronic hypoxic respiratory on 2.5 L baseline, chronic diastolic CHF, PVD, IDDM, history of noncompliant with home medications, presented with increasing shortness of breath and cough.   Assessment/Plan:   Active Problems:   COPD with acute exacerbation (HCC)   COPD (chronic obstructive pulmonary disease) (HCC)   Acute COPD exacerbation -Solu-Medrol-- wean off to PO and taper, breathing treatment -on home dose of O2 4-5L -doxycycline -pulm consult appreciated  Acute on chronic chronic diastolic CHF -D/C Cardizem per admitter and heart rate control. - Lasix IV-- monitor Cr -Repeat chest x-ray shows: Interval worsening of diffuse bilateral airspace opacities which may be due to CHF/fluid volume overload or multifocal pneumonia.  AKI  -on diuretics -check BNP -daily labs -strict I/o  Troponins elevation -Denied any chest pains, suspect troponin elevation secondary to AKI and demanding ischemia from COPD exacerbation -trending down  Elevated lactic acid level -improved  Sinus arrhythmia -Similar EKG presentation as compared to last month. -episodes of SVT- was taken off cardizem at admission  DM type 2 -D/C Metformin - Sliding scale -while on steroids add insulin SQ  CAD -Continue aspirin, Plavix and statin   Was seen by palliative care last admission but it appears that outpatient palliative care was not ordered at d/c-- will defer to PCP   Family Communication/Anticipated D/C date and plan/Code Status   DVT prophylaxis: Lovenox ordered. Code Status: dnr Disposition Plan: Status is: Inpatient  Remains inpatient appropriate because:Inpatient level of care appropriate due to severity of  illness   Dispo: The patient is from: Home?              Anticipated d/c is to: home              Patient currently is not medically stable to d/c. home in AM if continues to improve   Difficult to place patient No         Medical Consultants:    pulm  Subjective:   Breathing better Says he is careful at home with his salt  Objective:    Vitals:   04/11/21 0123 04/11/21 0431 04/11/21 0510 04/11/21 0738  BP:   104/71   Pulse:   82 82  Resp:   18 19  Temp:   97.8 F (36.6 C)   TempSrc:   Axillary   SpO2: 100% 97%  100%  Weight:        Intake/Output Summary (Last 24 hours) at 04/11/2021 1106 Last data filed at 04/11/2021 0343 Gross per 24 hour  Intake --  Output 1450 ml  Net -1450 ml   Filed Weights   04/09/21 2224  Weight: 74.4 kg    Exam:  General: Appearance:    In chair male in no acute distress     Lungs:     respirations unlabored  Heart:    Normal heart rate. Normal rhythm. No murmurs, rubs, or gallops.   MS:   All extremities are intact.   Neurologic:   Awake, alert.     Data Reviewed:   I have personally reviewed following labs and imaging studies:  Labs: Labs show the following:   Basic Metabolic Panel: Recent Labs  Lab 04/09/21 1521 04/09/21 1927 04/10/21 0153 04/11/21  0412  NA 137  --  137 135  K 3.2*  --  4.1 4.4  CL 97*  --  98 99  CO2 27  --  23 28  GLUCOSE 100*  --  282* 308*  BUN 18  --  20 28*  CREATININE 1.44*  --  1.41* 1.56*  CALCIUM 8.9  --  8.6* 8.8*  MG  --  1.8  --   --   PHOS  --  3.5  --   --    GFR Estimated Creatinine Clearance: 43.1 mL/min (A) (by C-G formula based on SCr of 1.56 mg/dL (H)). Liver Function Tests: Recent Labs  Lab 04/09/21 1521  AST 16  ALT 16  ALKPHOS 63  BILITOT 0.9  PROT 6.4*  ALBUMIN 2.7*   No results for input(s): LIPASE, AMYLASE in the last 168 hours. No results for input(s): AMMONIA in the last 168 hours. Coagulation profile No results for input(s): INR, PROTIME in the  last 168 hours.  CBC: Recent Labs  Lab 04/09/21 1521 04/10/21 0153 04/11/21 0412  WBC 8.2 5.6 11.0*  HGB 11.3* 10.4* 9.6*  HCT 36.7* 33.6* 30.9*  MCV 88.2 87.3 87.3  PLT 451* 417* 441*   Cardiac Enzymes: No results for input(s): CKTOTAL, CKMB, CKMBINDEX, TROPONINI in the last 168 hours. BNP (last 3 results) No results for input(s): PROBNP in the last 8760 hours. CBG: Recent Labs  Lab 04/10/21 0752 04/10/21 1211 04/10/21 1659 04/10/21 2010 04/11/21 0809  GLUCAP 271* 331* 463* 312* 271*   D-Dimer: No results for input(s): DDIMER in the last 72 hours. Hgb A1c: No results for input(s): HGBA1C in the last 72 hours. Lipid Profile: No results for input(s): CHOL, HDL, LDLCALC, TRIG, CHOLHDL, LDLDIRECT in the last 72 hours. Thyroid function studies: No results for input(s): TSH, T4TOTAL, T3FREE, THYROIDAB in the last 72 hours.  Invalid input(s): FREET3 Anemia work up: No results for input(s): VITAMINB12, FOLATE, FERRITIN, TIBC, IRON, RETICCTPCT in the last 72 hours. Sepsis Labs: Recent Labs  Lab 04/09/21 1521 04/09/21 1927 04/10/21 0153 04/10/21 1417 04/11/21 0412  PROCALCITON  --   --   --  <0.10  --   WBC 8.2  --  5.6  --  11.0*  LATICACIDVEN 3.2* 2.9*  --   --   --     Microbiology Recent Results (from the past 240 hour(s))  Culture, blood (routine x 2)     Status: None (Preliminary result)   Collection Time: 04/09/21  3:22 PM   Specimen: BLOOD  Result Value Ref Range Status   Specimen Description BLOOD RIGHT ANTECUBITAL  Final   Special Requests   Final    BOTTLES DRAWN AEROBIC AND ANAEROBIC Blood Culture adequate volume   Culture   Final    NO GROWTH 2 DAYS Performed at [facility], [place] [address] [place], [place] [address]    Report Status PENDING  Incomplete  Resp Panel by RT-PCR (Flu A&B, Covid) Nasopharyngeal Swab     Status: None   Collection Time: 04/09/21  4:11 PM   Specimen: Nasopharyngeal Swab; Nasopharyngeal(NP) swabs in vial  transport medium  Result Value Ref Range Status   SARS Coronavirus 2 by RT PCR NEGATIVE NEGATIVE Final    Comment: (NOTE) SARS-CoV-2 target nucleic acids are NOT DETECTED.  The SARS-CoV-2 RNA is generally detectable in upper respiratory specimens during the acute phase of infection. The lowest concentration of SARS-CoV-2 viral copies this assay can detect is 138 copies/mL. A negative  result does not preclude SARS-Cov-2 infection and should not be used as the sole basis for treatment or other patient management decisions. A negative result may occur with  improper specimen collection/handling, submission of specimen other than nasopharyngeal swab, presence of viral mutation(s) within the areas targeted by this assay, and inadequate number of viral copies(<138 copies/mL). A negative result must be combined with clinical observations, patient history, and epidemiological information. The expected result is Negative.  Fact Sheet for Patients:  EntrepreneurPulse.com.au  Fact Sheet for Healthcare Providers:  IncredibleEmployment.be  This test is no t yet approved or cleared by the [place] FDA and  has been authorized for detection and/or diagnosis of SARS-CoV-2 by FDA under an Emergency Use Authorization (EUA). This EUA will remain  in effect (meaning this test can be used) for the duration of the COVID-19 declaration under Section 564(b)(1) of the Act, 21 U.S.C.section 360bbb-3(b)(1), unless the authorization is terminated  or revoked sooner.       Influenza A by PCR NEGATIVE NEGATIVE Final   Influenza B by PCR NEGATIVE NEGATIVE Final    Comment: (NOTE) The Xpert Xpress SARS-CoV-2/FLU/RSV plus assay is intended as an aid in the diagnosis of influenza from Nasopharyngeal swab specimens and should not be used as a sole basis for treatment. Nasal washings and aspirates are unacceptable for Xpert Xpress SARS-CoV-2/FLU/RSV testing.  Fact  Sheet for Patients: EntrepreneurPulse.com.au  Fact Sheet for Healthcare Providers: IncredibleEmployment.be  This test is not yet approved or cleared by the [place] FDA and has been authorized for detection and/or diagnosis of SARS-CoV-2 by FDA under an Emergency Use Authorization (EUA). This EUA will remain in effect (meaning this test can be used) for the duration of the COVID-19 declaration under Section 564(b)(1) of the Act, 21 U.S.C. section 360bbb-3(b)(1), unless the authorization is terminated or revoked.  Performed at [facility], [place] [address], [place], [place] [address]   Culture, blood (routine x 2)     Status: None (Preliminary result)   Collection Time: 04/09/21  4:13 PM   Specimen: BLOOD  Result Value Ref Range Status   Specimen Description BLOOD SITE NOT SPECIFIED  Final   Special Requests   Final    BOTTLES DRAWN AEROBIC AND ANAEROBIC Blood Culture results may not be optimal due to an inadequate volume of blood received in culture bottles   Culture   Final    NO GROWTH 2 DAYS Performed at [facility], [place] [address], [place], [place] [address]    Report Status PENDING  Incomplete    Procedures and diagnostic studies:  DG Chest Port 1 View  Result Date: 04/10/2021 CLINICAL DATA:  CHF EXAM: PORTABLE CHEST 1 VIEW COMPARISON:  04/09/2021 FINDINGS: Interval worsening of bilateral airspace opacity. Heart size within normal limits. No significant pulmonary vascular congestion. IMPRESSION: Interval worsening of diffuse bilateral airspace opacities which may be due to CHF/fluid volume overload or multifocal pneumonia. Electronically Signed   By: Miachel Roux M.D.   On: 04/10/2021 07:58   DG Chest Port 1 View  Result Date: 04/09/2021 CLINICAL DATA:  Dyspnea. EXAM: PORTABLE CHEST 1 VIEW COMPARISON:  03/13/2021 FINDINGS: Lungs are hyperexpanded. Interstitial markings are diffusely coarsened with chronic features.  Basilar predominant alveolar opacity again noted, less pronounced than on the prior study. No substantial pleural effusion. Cardiopericardial silhouette is at upper limits of normal for size. Bones are diffusely demineralized. IMPRESSION: Emphysema with persistent basilar predominant alveolar opacity, less pronounced than on the prior study. Imaging  features suggest edema superimposed on chronic underlying interstitial lung disease. Electronically Signed   By: Misty Stanley M.D.   On: 04/09/2021 15:58    Medications:   . aspirin EC  81 mg Oral Daily  . atorvastatin  40 mg Oral QHS  . bisoprolol  10 mg Oral BID  . clopidogrel  75 mg Oral Daily  . doxycycline  100 mg Oral Q12H  . enoxaparin (LOVENOX) injection  40 mg Subcutaneous Q24H  . feeding supplement (GLUCERNA SHAKE)  237 mL Oral TID BM  . fluticasone furoate-vilanterol  1 puff Inhalation Daily  . [START ON 04/12/2021] furosemide  40 mg Intravenous Daily  . guaiFENesin  1,200 mg Oral BID  . insulin aspart  0-15 Units Subcutaneous TID WC  . insulin aspart  0-5 Units Subcutaneous QHS  . insulin aspart  8 Units Subcutaneous TID WC  . insulin glargine  10 Units Subcutaneous QHS  . ipratropium-albuterol  3 mL Nebulization Q6H  . magnesium oxide  400 mg Oral Daily  . methylPREDNISolone (SOLU-MEDROL) injection  40 mg Intravenous Q12H  . multivitamin with minerals  1 tablet Oral Daily  . pantoprazole  40 mg Oral QAC breakfast  . potassium chloride  20 mEq Oral Daily  . [START ON 04/12/2021] predniSONE  40 mg Oral Q breakfast  . umeclidinium bromide  1 puff Inhalation Daily   Continuous Infusions:    LOS: 2 days   Geradine Girt  [facility]   How to contact the [facility] Attending or Consulting provider [place] or covering provider during after hours [place], for this patient?  1. Check the care team in [facility] and look for a) attending/consulting TRH provider listed and b) the [facility] team listed 2. Log into [email or web address] and use [facility]'s universal password to access. If you do not have the password, please contact the hospital operator. 3. Locate the [facility] provider you are looking for under [facility] and page to a number that you can be directly reached. 4. If you still have difficulty reaching the provider, please page the [facility] (Director on Call) for the Hospitalists listed on amion for assistance.  04/11/2021, 11:06 AM

## 2021-04-11 NOTE — Progress Notes (Signed)
Inpatient Diabetes Program Recommendations  AACE/ADA: New Consensus Statement on Inpatient Glycemic Control  Target Ranges:  Prepandial:   less than 140 mg/dL      Peak postprandial:   less than 180 mg/dL (1-2 hours)      Critically ill patients:  140 - 180 mg/dL  Results for Brandon [name], Brandon [name] (MRN [id number]) as of 04/11/2021 09:31  Ref. Range 04/10/2021 07:52 04/10/2021 12:11 04/10/2021 16:59 04/10/2021 20:10 04/11/2021 08:09  Glucose-Capillary Latest Ref Range: 70 - 99 mg/dL 271 (H) 331 (H) 463 (H) 312 (H) 271 (H)    Review of Glycemic Control  Diabetes history: DM2 Outpatient Diabetes medications: Jardiance 10 mg QAM, Metformin 1000 BID Current orders for Inpatient glycemic control:Lantus 5 units QHS, Novolog 8 units TID with meals Novolog 0-15 units TID with meals, Novolog 0-5 units QHS; Solumedrol 40 mg Q12H  Inpatient Diabetes Program Recommendations:   Insulin: Noted Novolog 8 units ordered for meal coverage.  If steroids are continued as ordered, please consider increasing Lantus to 10 units QHS.  Thanks, Barnie Alderman, RN, MSN, CDE Diabetes Coordinator Inpatient Diabetes Program [phone] (Team Pager from 8am to 5pm)

## 2021-04-11 NOTE — Evaluation (Signed)
Occupational Therapy Evaluation Patient Details Name: Brandon [name] MRN: [id number] DOB: [birth year] Today's Date: 04/11/2021    History of Present Illness Brandon [name] is a 75 y.o. male who presented with increasing shortness of breath and cough.  PMH: COPD, 4.5-5 L baseline, chronic diastolic CHF, PVD, IDDM, history of noncompliant with home medications   Clinical Impression   Pt PTA: Pt living at home alone, independent with most ADL and daughter visits pt weekly for iADL management. Pt currently, Pt limited by decreased activity tolerance and increased need for LB ADL. Pt performing mobility with RW for stability. Pt set-upA to minA for ADL and minguardA for mobility. pt's O2 on 6L HF [place] and O2 sats >90% with exertion. BP and HR stable. Pt would benefit from continued OT skilled services. OT following acutely.      Follow Up Recommendations  Home health OT;Supervision - Intermittent    Equipment Recommendations  None recommended by OT    Recommendations for Other Services       Precautions / Restrictions Precautions Precautions: Fall Precaution Comments: watch O2, >3 L at baseline Restrictions Weight Bearing Restrictions: No      Mobility Bed Mobility Overal bed mobility: Needs Assistance Bed Mobility: Supine to Sit     Supine to sit: Supervision;HOB elevated          Transfers Overall transfer level: Needs assistance Equipment used: Rolling walker (2 wheeled) Transfers: Sit to/from [facility] Sit to Stand: Supervision Stand pivot transfers: Supervision       General transfer comment: No physical assist.  Cues for hand placement and pursed lip breathing.    Balance Overall balance assessment: Needs assistance Sitting-balance support: Feet supported;No upper extremity supported Sitting balance-Leahy Scale: Good Sitting balance - Comments: sitting EOB with supervision   Standing balance support: No upper extremity supported;Single  extremity supported;During functional activity Standing balance-Leahy Scale: Poor Standing balance comment: Reliant on B UE support, especially during transfers and ambulation                           ADL either performed or assessed with clinical judgement   ADL Overall ADL's : Needs assistance/impaired Eating/Feeding: Set up;Sitting   Grooming: Set up;Sitting   Upper Body Bathing: Set up;Sitting   Lower Body Bathing: Minimal assistance;Sitting/lateral leans;Sit to/from stand;Cueing for safety   Upper Body Dressing : Set up;Sitting   Lower Body Dressing: Minimal assistance;Sitting/lateral leans;Sit to/from stand   Toilet Transfer: Supervision/safety;Ambulation;RW   Toileting- Clothing Manipulation and Hygiene: Minimal assistance;Cueing for safety;Sitting/lateral lean;Sit to/from stand       Functional mobility during ADLs: Supervision/safety;Rolling walker General ADL Comments: Pt limited by decreased activity tolerance and increased need for LB ADL. Pt performing mobility with RW for stability.     Vision Baseline Vision/History: No visual deficits Patient Visual Report: No change from baseline Vision Assessment?: No apparent visual deficits     Perception     Praxis      Pertinent Vitals/Pain Pain Assessment: No/denies pain     Hand Dominance Right   Extremity/Trunk Assessment Upper Extremity Assessment Upper Extremity Assessment: Generalized weakness;RUE deficits/detail;LUE deficits/detail RUE Deficits / Details: AROM, WNLs; 4/5 MMT LUE Deficits / Details: AROM, WNLs; 4/5 MMT   Lower Extremity Assessment Lower Extremity Assessment: Generalized weakness   Cervical / Trunk Assessment Cervical / Trunk Assessment: Kyphotic   Communication Communication Communication: HOH   Cognition Arousal/Alertness: Awake/alert Behavior During Therapy: WFL for tasks assessed/performed Overall Cognitive Status: Within  Functional Limits for tasks assessed                                  General Comments: A&Ox4, slightly frustrated that "they can't figure out what's going on."   General Comments  O2 on 6L HF [place] and O2 sats >90% with exertion. BP and HR stable.    Exercises     Shoulder Instructions      Home Living Family/patient expects to be discharged to:: Private residence Living Arrangements: Alone Available Help at Discharge: Family;Available PRN/intermittently;Friend(s) (daughter and neighbors) Type of Home: House Home Access: Stairs to enter [employer] of Steps: 3 Entrance Stairs-Rails: Right;Left;Can reach both Home Layout: One level     Bathroom Shower/Tub: Teacher, early years/pre: Standard Bathroom Accessibility: Yes   Home Equipment: Grab bars - tub/shower;Shower seat;Cane - single point;Walker - 2 wheels;Walker - 4 wheels (4.5- 5L home O2 since Jan 2021)   Additional Comments: daughter still working on getting pt a wheelchair      Prior Functioning/Environment Level of Independence: Needs assistance  Gait / Transfers Assistance Needed: using walker PTA, denies falls ADL's / Homemaking Assistance Needed: Pt requires assist for shower transfers. Pt's daughter manages medications and grocery shops/perform iADLs for pt on Saturdays. Communication / Swallowing Assistance Needed: mild HOH Comments: Drives. No falls. Does not cook much. Wears 3.5 L 02 at home        OT Problem List: Decreased strength;Decreased activity tolerance;Decreased safety awareness;Cardiopulmonary status limiting activity      OT Treatment/Interventions: Self-care/ADL training;Therapeutic exercise;Therapeutic activities;Patient/family education;Balance training;Energy conservation    OT Goals(Current goals can be found in the care plan section) Acute Rehab OT Goals Patient Stated Goal: breathe easier and figure out why I can't breathe OT Goal Formulation: With patient Potential to Achieve Goals: Good ADL  Goals Pt Will Perform Lower Body Dressing: with min guard assist;sitting/lateral leans;sit to/from stand Pt/caregiver will Perform Home Exercise Program: Increased strength;Both right and left upper extremity;With Supervision Additional ADL Goal #1: Pt to state 3 energy conservation strategies for ADL and mobility to increase activity tolerance. Additional ADL Goal #2: Pt will increase to standing at sink x10 mins with 1 seated rest break and O2 sats >90%.  OT Frequency: Min 2X/week   Barriers to D/C:            Co-evaluation              AM-PAC OT "6 Clicks" Daily Activity     Outcome Measure Help from another person eating meals?: None Help from another person taking care of personal grooming?: A Little Help from another person toileting, which includes using toliet, bedpan, or urinal?: A Little Help from another person bathing (including washing, rinsing, drying)?: A Little Help from another person to put on and taking off regular upper body clothing?: None Help from another person to put on and taking off regular lower body clothing?: A Little 6 Click Score: 20   End of Session Equipment Utilized During Treatment: Gait belt;Rolling walker;Oxygen Nurse Communication: Mobility status  Activity Tolerance: Patient tolerated treatment well Patient left: in chair;with call bell/phone within reach;with chair alarm set  OT Visit Diagnosis: Unsteadiness on feet (R26.81);Muscle weakness (generalized) (M62.81)                Time: 1015-1040 OT Time Calculation (min): 25 min Charges:  OT General Charges $OT Visit: 1 Visit OT Evaluation $OT  Eval Moderate Complexity: 1 Mod OT Treatments $Self Care/Home Management : 8-22 mins  Jefferey Pica, OTR/L Acute Rehabilitation Services Pager: [phone] Office: [phone]   Thora Scherman  C 04/11/2021, 4:40 PM

## 2021-04-11 NOTE — Progress Notes (Signed)
NAME:  Brandon [name], MRN:  [id number], DOB:  1946/07/12, LOS: 2 ADMISSION DATE:  04/09/2021, CONSULTATION DATE:  04/10/21 REFERRING MD:  Eliseo Squires - TRH, CHIEF COMPLAINT:  Chronic hypoxic respiratory failure  History of Present Illness:  75 yo PMH COPD followed at the [place], chronic hypoxic respiratory failure on 2.5LNC, chronic diastolic heart failure, PVD, CAD who presented to ED 4/10 with CC SOB. Taken to ED by EMS who noted pts tachypnea and hypoxia requiring 5L O2. No associated chest pain, URI sx, fever, chills. In ED CXR most consistent with pulmonary edema. Pt placed on BiPAP, admitted to [facility] for suspected heart failure and COPD exacerbation.  Pulmonary consulted for consideration of possible home BiPAP utility   Pertinent  Medical History  COPD PAD PVD  Chronic diastolic heart failure   Significant Hospital Events: Including procedures, antibiotic start and stop dates in addition to other pertinent events   . 4/10 admitted to [facility]. Started on doxy, solumedrol, lasix  . 4/11 pulm consult   Interim History / Subjective:  Feels "much better"  Objective   Blood pressure 104/71, pulse 82, temperature 97.8 F (36.6 C), temperature source Axillary, resp. rate 19, weight 74.4 kg, SpO2 100 %.    FiO2 (%):  [40 %] 40 %   Intake/Output Summary (Last 24 hours) at 04/11/2021 1049 Last data filed at 04/11/2021 0343 Gross per 24 hour  Intake no documentation  Output 1450 ml  Net -1450 ml   Filed Weights   04/09/21 2224  Weight: 74.4 kg    Examination: General 75 year old WM sitting up in chair. He is in no distress HENT NCAT no JVD MMM pulm basilar rales. No accessory use. Currently 6 lpm  Card RRR w/ occ irreg beat. No murmur or gallop. SR on tele w/ PVCs abd soft not tender + bowel sounds tol diet gu voids  Neuro awake and alert. No focal def Ext warm and dry chronic venous stasis changes. Residual edema is minimal    Labs/imaging that I havepersonally reviewed  (right click  and "Reselect all SmartList Selections" daily)  CXR 4/11- bilateral opacities   4/11- WBC 5.6. Cr 1.41  Resolved Hospital Problem list     Assessment & Plan:   Acute on chronic respiratory failure 2/2 pulmonary edema due to acute diastolic HF +/- COPD exacerbation.   Has reported h/o COPD for which he is followed for by the [place]. We do not have PFTs to confirm this. He certainly could have Nocturnal hypoxia and or worse baseline hypoxia w/ exertion then what was identified at baseline which would predispose him to exacerbating his HF. He does get symptomatic response to nebulized BDs and also feels a little better all together. I think this is mostly from diuresis.  - I am not sure if he will meet criteria for nocturnal NIPPV or not.  - scr up a little. I&O -970  Plan/rec Cont to push diuresis ->if bumps again prob need to back down Cont supplemental oxygen->will be important to check walking oximetry to determine he is getting enough oxygen at home.  Home on his Spiriva, advair and PRN albuterol. He feels like he does better w/ nebulized therapy. Would consider albuterol NEB as a PRN alternative  We are weaning steroids. Will change him to 40 pred daily and just complete 3 more days. And stop Ordering am ABG to see if has hypercarbia to justify NIPPV if not he will need to get out pt PSG  Best practice (right click and "Reselect all SmartList Selections" daily)  Per primary   Erick Colace ACNP-BC [place] Pager # [phone] OR # [phone] if no answer

## 2021-04-11 NOTE — TOC Initial Note (Addendum)
Transition of Care [facility]) - Initial/Assessment Note    Patient Details  Name: Brandon [name] MRN: [id number] Date of Birth: August 05, 1946  Transition of Care [facility]) CM/SW Contact:    Bethann Berkshire, [place] Phone Number: 04/11/2021, 12:30 PM  Clinical Narrative:                  CSW met with pt to discuss d/c plans. Pt is not interested and SNF and wants to return home. He lives home alone. Reports he receives his O2 from [place]. Pt also states he is active with home health but is not sure with what company. Pt consents to CSW contacting pt's daughter.  CSW called daughter. She confirmed plan for pt to return home. She states she visit's him weekly and calls him daily. She confirms he is active with [facility]. She will be the one who would transport pt home. TOC will continue to follow.    1313: Spoke with pt daughter again. She will be able to get an oxygen tank from pt's home to transport him home. She will be coming from [place] and will need to be notified early as possible on day of discharge.   Expected Discharge Plan: [place] Barriers to Discharge: Continued Medical Work up   Patient Goals and CMS Choice Patient states their goals for this hospitalization and ongoing recovery are:: return home      Expected Discharge Plan and Services Expected Discharge Plan: [place]       Living arrangements for the past 2 months: [place]                                      Prior Living Arrangements/Services Living arrangements for the past 2 months: [place] Lives with:: Self Patient language and need for interpreter reviewed:: Yes        Need for Family Participation in Patient Care: Yes (Comment) Care giver support system in place?: Yes (comment) Current home services: DME,Home OT,Home PT Criminal Activity/Legal Involvement Pertinent to Current Situation/Hospitalization: No -  Comment as needed  Activities of Daily Living Home Assistive Devices/Equipment: Oxygen,Cane (specify quad or straight),Walker (specify type) ADL Screening (condition at time of admission) Patient's cognitive ability adequate to safely complete daily activities?: Yes Is the patient deaf or have difficulty hearing?: Yes Does the patient have difficulty seeing, even when wearing glasses/contacts?: Yes Does the patient have difficulty concentrating, remembering, or making decisions?: No Patient able to express need for assistance with ADLs?: Yes Does the patient have difficulty dressing or bathing?: Yes Independently performs ADLs?: No Communication: Independent Dressing (OT): Needs assistance Is this a change from baseline?: Pre-admission baseline Grooming: Needs assistance Is this a change from baseline?: Pre-admission baseline Feeding: Independent Bathing: Needs assistance Is this a change from baseline?: Pre-admission baseline Toileting: Independent In/Out Bed: Independent Walks in Home: Independent Does the patient have difficulty walking or climbing stairs?: Yes Weakness of Legs: Both Weakness of Arms/Hands: None  Permission Sought/Granted   Permission granted to share information with : Yes, Verbal Permission Granted  Share Information with NAME: daughter Mardene Celeste           Emotional Assessment Appearance:: Appears stated age Attitude/Demeanor/Rapport: Engaged Affect (typically observed): Appropriate Orientation: : Oriented to Self,Oriented to Place,Oriented to  Time,Oriented to Situation Alcohol / Substance Use: Not Applicable Psych Involvement: No (comment)  Admission  diagnosis:  COPD (chronic obstructive pulmonary disease) ([place]) [J44.9] CHF (congestive heart failure) ([place]) [I50.9] Elevated troponin [R77.8] COPD exacerbation (HCC) [J44.1] Acute on chronic congestive heart failure, unspecified heart failure type [facility]) [I50.9] Patient Active Problem List    Diagnosis Date Noted  . COPD (chronic obstructive pulmonary disease) ([place]) 04/09/2021  . Acute on chronic congestive heart failure ([place])   . Palliative care by specialist   . Advance care planning   . Prolonged QT interval 03/13/2021  . Protein-calorie malnutrition, severe 02/21/2021  . Acute on chronic respiratory failure with hypoxia ([place]) 02/19/2021  . Normocytic anemia 02/19/2021  . (HFpEF) heart failure with preserved ejection fraction ([place]) 02/01/2021  . Pressure injury of skin 01/09/2021  . Malnutrition of moderate degree 01/09/2021  . Elevated troponin 01/07/2021  . COPD with acute exacerbation ([place]) 01/07/2021  . History of CVA (cerebrovascular accident) 01/07/2021  . COPD exacerbation ([place]) 01/07/2021  . Hypomagnesemia 01/07/2021  . Essential hypertension   . Type 2 diabetes mellitus ([place])   . CAD (coronary artery disease)   . GERD (gastroesophageal reflux disease)   . Acute on chronic respiratory failure with hypoxemia ([place])   . COPD without exacerbation ([place])   . Tobacco abuse   . Hyponatremia   . Hyperkalemia   . Ischemic stroke [facility])    PCP:  Pcp, No Pharmacy:   [place], [place] - [address] [place] [address] Phone: [phone] Fax: [phone]  [place], [place]. [place] [address] Phone: [phone] Fax: [phone]     Social Determinants of Health (SDOH) Interventions    Readmission Risk Interventions Readmission Risk Prevention Plan 02/24/2021 02/06/2021  Transportation Screening Complete Complete  PCP or Specialist Appt within 3-5 Days - Complete  HRI or [place] - Complete  Social Work Consult for [place] Planning/Counseling - Complete  Palliative Care Screening - Not Applicable  Medication Review Press photographer) Complete Complete  PCP or Specialist appointment within 3-5  days of discharge (No Data) -  [place] or Home Care Consult Complete -  SW Recovery Care/Counseling Consult Complete -  Palliative Care Screening Not Applicable -  [place] Not Applicable -  Some recent data might be hidden

## 2021-04-12 ENCOUNTER — Inpatient Hospital Stay (HOSPITAL_COMMUNITY): Payer: No Typology Code available for payment source

## 2021-04-12 DIAGNOSIS — I509 Heart failure, unspecified: Secondary | ICD-10-CM | POA: Diagnosis not present

## 2021-04-12 DIAGNOSIS — J449 Chronic obstructive pulmonary disease, unspecified: Secondary | ICD-10-CM | POA: Diagnosis not present

## 2021-04-12 LAB — BASIC METABOLIC PANEL
Anion gap: 7 (ref 5–15)
BUN: 30 mg/dL — ABNORMAL HIGH (ref 8–23)
CO2: 30 mmol/L (ref 22–32)
Calcium: 8.9 mg/dL (ref 8.9–10.3)
Chloride: 99 mmol/L (ref 98–111)
Creatinine, Ser: 1.1 mg/dL (ref 0.61–1.24)
GFR, Estimated: >60 mL/min (ref >60)
Glucose, Bld: 218 mg/dL — ABNORMAL HIGH (ref 70–99)
Potassium: 3.7 mmol/L (ref 3.5–5.1)
Sodium: 136 mmol/L (ref 135–145)

## 2021-04-12 LAB — BLOOD GAS, ARTERIAL
Acid-Base Excess: 3.2 mmol/L — ABNORMAL HIGH (ref 0.0–2.0)
Bicarbonate: 27.1 mmol/L (ref 20.0–28.0)
FIO2: 40
O2 Saturation: 88.6 %
Patient temperature: 36.6
pCO2 arterial: 40.1 mmHg (ref 32.0–48.0)
pH, Arterial: 7.443 (ref 7.350–7.450)
pO2, Arterial: 59.1 mmHg — ABNORMAL LOW (ref 83.0–108.0)

## 2021-04-12 LAB — CBC
HCT: 35.3 % — ABNORMAL LOW (ref 39.0–52.0)
Hemoglobin: 10.8 g/dL — ABNORMAL LOW (ref 13.0–17.0)
MCH: 26.7 pg (ref 26.0–34.0)
MCHC: 30.6 g/dL (ref 30.0–36.0)
MCV: 87.4 fL (ref 80.0–100.0)
Platelets: 495 10*3/uL — ABNORMAL HIGH (ref 150–400)
RBC: 4.04 MIL/uL — ABNORMAL LOW (ref 4.22–5.81)
RDW: 21.1 % — ABNORMAL HIGH (ref 11.5–15.5)
WBC: 10.1 10*3/uL (ref 4.0–10.5)
nRBC: 0.2 % (ref 0.0–0.2)

## 2021-04-12 LAB — GLUCOSE, CAPILLARY
Glucose-Capillary: 188 mg/dL — ABNORMAL HIGH (ref 70–99)
Glucose-Capillary: 190 mg/dL — ABNORMAL HIGH (ref 70–99)
Glucose-Capillary: 203 mg/dL — ABNORMAL HIGH (ref 70–99)
Glucose-Capillary: 340 mg/dL — ABNORMAL HIGH (ref 70–99)
Glucose-Capillary: 268 mg/dL — ABNORMAL HIGH (ref 70–99)

## 2021-04-12 LAB — MAGNESIUM: Magnesium: 2 mg/dL (ref 1.7–2.4)

## 2021-04-12 LAB — BRAIN NATRIURETIC PEPTIDE: B Natriuretic Peptide: 470.5 pg/mL — ABNORMAL HIGH (ref 0.0–100.0)

## 2021-04-12 NOTE — Progress Notes (Signed)
Physical Therapy Treatment Patient Details Name: Brandon [name] MRN: [id number] DOB: [birth year] Today's Date: 04/12/2021    History of Present Illness Brandon [name] is a 75 y.o. male who presented with increasing shortness of breath and cough.  PMH: COPD, 4.5-5 L baseline, chronic diastolic CHF, PVD, IDDM, history of noncompliant with home medications    PT Comments    Pt restless and frustrated upon arrival to room, states he has been in the hospital too many times and wants to go home. Pt ambulatory in hallway with RW this session, pt limited by DOE 3/4 and accompanying O2 desaturation to 81% on 6-8LO2. Pt requires prolonged seated rest to recover sats. PT to continue to follow acutely.     Follow Up Recommendations  Home health PT     Equipment Recommendations  None recommended by PT    Recommendations for Other Services       Precautions / Restrictions Precautions Precautions: Fall Precaution Comments: watch O2,  4.5LO2 at baseline Restrictions Weight Bearing Restrictions: No    Mobility  Bed Mobility Overal bed mobility: Needs Assistance Bed Mobility: Supine to Sit     Supine to sit: Supervision;HOB elevated     General bed mobility comments: supervision for safety, verbal cuing for sequencing task and requires increased time/effort.    Transfers Overall transfer level: Needs assistance Equipment used: Rolling walker (2 wheeled) Transfers: Sit to/from Stand Sit to Stand: Min guard         General transfer comment: for safety, slow to rise and steady.  Ambulation/Gait Ambulation/Gait assistance: Min guard Gait Distance (Feet): 85 Feet (x2) Assistive device: Rolling walker (2 wheeled) Gait Pattern/deviations: Step-through pattern;Decreased stride length;Trunk flexed Gait velocity: decreased   General Gait Details: min guard for safety, verbal cuing for placement in RW, breathing technique. Pt placed on 8LO2 during gait for sPO2 81% on 6LO2, recovers to  90% with 3 minute seated rest break and cues for pursed lip breathing.   Stairs             Wheelchair Mobility    Modified Rankin (Stroke Patients Only)       Balance Overall balance assessment: Needs assistance Sitting-balance support: Feet supported;No upper extremity supported Sitting balance-Leahy Scale: Good Sitting balance - Comments: sitting EOB with supervision   Standing balance support: No upper extremity supported;Single extremity supported;During functional activity Standing balance-Leahy Scale: Poor Standing balance comment: Reliant on B UE support, especially during transfers and ambulation                            Cognition Arousal/Alertness: Awake/alert Behavior During Therapy: WFL for tasks assessed/performed Overall Cognitive Status: Within Functional Limits for tasks assessed                                 General Comments: A&Ox4, irritable about frequent readmissions      Exercises      General Comments General comments (skin integrity, edema, etc.): 8LO2 with SpO2 86-91%      Pertinent Vitals/Pain Pain Assessment: Faces Faces Pain Scale: No hurt Pain Intervention(s): Monitored during session    Home Living                      Prior Function            PT Goals (current goals can now be found in the care plan  section) Acute Rehab PT Goals PT Goal Formulation: With patient Time For Goal Achievement: 04/24/21 Potential to Achieve Goals: Good Progress towards PT goals: Progressing toward goals    Frequency    Min 3X/week      PT Plan Current plan remains appropriate    Co-evaluation              AM-PAC PT "6 Clicks" Mobility   Outcome Measure  Help needed turning from your back to your side while in a flat bed without using bedrails?: None Help needed moving from lying on your back to sitting on the side of a flat bed without using bedrails?: None Help needed moving to and  from a bed to a chair (including a wheelchair)?: A Little Help needed standing up from a chair using your arms (e.g., wheelchair or bedside chair)?: A Little Help needed to walk in hospital room?: A Little Help needed climbing 3-5 steps with a railing? : A Little 6 Click Score: 20    End of Session Equipment Utilized During Treatment: Oxygen Activity Tolerance: Patient tolerated treatment well;Patient limited by fatigue Patient left: in chair;with call bell/phone within reach;with chair alarm set;with nursing/sitter in room Nurse Communication: Mobility status PT Visit Diagnosis: Unsteadiness on feet (R26.81);Difficulty in walking, not elsewhere classified (R26.2)     Time: 1005-1030 PT Time Calculation (min) (ACUTE ONLY): 25 min  Charges:  $Gait Training: 8-22 mins $Therapeutic Activity: 8-22 mins                     Stacie Glaze, PT Acute Rehabilitation Services Pager [phone]  Office [phone]    [place] 04/12/2021, 1:00 PM

## 2021-04-12 NOTE — Progress Notes (Signed)
Progress Note    Brandon [name]  ESP:[id number] DOB: January 21, 1946  DOA: 04/09/2021 PCP: Pcp, No    Brief Narrative:     Medical records reviewed and are as summarized below:  Brandon [name] is an 75 y.o. male with medical history significant of COPD, chronic hypoxic respiratory on 2.5 L baseline, chronic diastolic CHF, PVD, IDDM, history of noncompliant with home medications, presented with increasing shortness of breath and cough.   Assessment/Plan:   Active Problems:   COPD with acute exacerbation (HCC)   COPD (chronic obstructive pulmonary disease) (HCC)   Acute on chronic hypoxic respiratory failure secondary to combination of acute COPD exacerbation and acute on chronic diastolic congestive heart failure: Baseline oxygen requirement for patient is 4.5 L.  He is currently requiring about 6 L at rest and 8 L with activity.  Although feels like his breathing is back to baseline.  He is requiring too much oxygen to be able to discharge home and use home concentrator.  No wheezes on lung exam.  Faint crackles.  Checking chest x-ray and BNP.  Continue prednisone for 2 more days per pulmonary.  Based off of chest x-ray and BNP, might consider more diuresis. -pulm consult appreciated.  AKI: Resolved.  Repeat labs tomorrow morning.  Troponins elevation -Denied any chest pains, suspect troponin elevation secondary to AKI and demanding ischemia from COPD exacerbation  Elevated lactic acid level -improved  Sinus arrhythmia -Similar EKG presentation as compared to last month. -episodes of SVT- was taken off cardizem at admission  DM type 2 -D/C Metformin - Sliding scale Hyperglycemic.  Add Lantus 10 units tonight and continue SSI.  CAD -Continue aspirin, Plavix and statin  Was seen by palliative care last admission but it appears that outpatient palliative care was not ordered at d/c-- will defer to PCP   Family Communication/Anticipated D/C date and plan/Code Status    DVT prophylaxis: Lovenox ordered. Code Status: dnr Disposition Plan: Status is: Inpatient Family communication: Discussed with daughter over the phone.  She was appreciative of the call. Remains inpatient appropriate because:Inpatient level of care appropriate due to severity of illness   Dispo: The patient is from: Home?              Anticipated d/c is to: home              Patient currently is not medically stable to d/c.  Potentially home in AM if continues to improve   Difficult to place patient No         Medical Consultants:    pulm  Subjective:   Patient seen and examined.  He was getting to start working with physical therapy.  He was little frustrated from being in the hospital so long.  Desperately wanted to go home.  Looked comfortable and felt like his breathing is back to baseline as well.  Objective:    Vitals:   04/11/21 1930 04/11/21 2129 04/12/21 0603 04/12/21 1100  BP:  126/78 122/76 116/73  Pulse: 91 92 80 82  Resp: 16 17 18 15   Temp:  97.9 F (36.6 C) 98.4 F (36.9 C) 98 F (36.7 C)  TempSrc:  Oral Oral Oral  SpO2: 95% 97% 100% 98%  Weight:        Intake/Output Summary (Last 24 hours) at 04/12/2021 1342 Last data filed at 04/12/2021 0811 Gross per 24 hour  Intake 240 ml  Output 2275 ml  Net -2035 ml   Filed Weights   04/09/21 2224  Weight: 74.4 kg   General exam: Appears calm and comfortable  Respiratory system: Diminished breath sounds with faint bibasilar crackles. Respiratory effort normal. Cardiovascular system: S1 & S2 heard, RRR. No JVD, murmurs, rubs, gallops or clicks.  Trace pitting edema bilateral lower extremity. Gastrointestinal system: Abdomen is nondistended, soft and nontender. No organomegaly or masses felt. Normal bowel sounds heard. Central nervous system: Alert and oriented. No focal neurological deficits. Extremities: Symmetric 5 x 5 power. Skin: No rashes, lesions or ulcers.  Psychiatry: Judgement and insight  appear normal. Mood & affect appropriate.    Data Reviewed:   I have personally reviewed following labs and imaging studies:  Labs: Labs show the following:   Basic Metabolic Panel: Recent Labs  Lab 04/09/21 1521 04/09/21 1927 04/10/21 0153 04/11/21 0412 04/12/21 0909  NA 137  --  137 135 136  K 3.2*  --  4.1 4.4 3.7  CL 97*  --  98 99 99  CO2 27  --  23 28 30   GLUCOSE 100*  --  282* 308* 218*  BUN 18  --  20 28* 30*  CREATININE 1.44*  --  1.41* 1.56* 1.10  CALCIUM 8.9  --  8.6* 8.8* 8.9  MG  --  1.8  --   --  2.0  PHOS  --  3.5  --   --   --    GFR Estimated Creatinine Clearance: 61.1 mL/min (by C-G formula based on SCr of 1.1 mg/dL). Liver Function Tests: Recent Labs  Lab 04/09/21 1521  AST 16  ALT 16  ALKPHOS 63  BILITOT 0.9  PROT 6.4*  ALBUMIN 2.7*   No results for input(s): LIPASE, AMYLASE in the last 168 hours. No results for input(s): AMMONIA in the last 168 hours. Coagulation profile No results for input(s): INR, PROTIME in the last 168 hours.  CBC: Recent Labs  Lab 04/09/21 1521 04/10/21 0153 04/11/21 0412 04/12/21 0909  WBC 8.2 5.6 11.0* 10.1  HGB 11.3* 10.4* 9.6* 10.8*  HCT 36.7* 33.6* 30.9* 35.3*  MCV 88.2 87.3 87.3 87.4  PLT 451* 417* 441* 495*   Cardiac Enzymes: No results for input(s): CKTOTAL, CKMB, CKMBINDEX, TROPONINI in the last 168 hours. BNP (last 3 results) No results for input(s): PROBNP in the last 8760 hours. CBG: Recent Labs  Lab 04/11/21 1158 04/11/21 1650 04/11/21 2128 04/12/21 0740 04/12/21 1059  GLUCAP 381* 244* 339* 188* 190*   D-Dimer: No results for input(s): DDIMER in the last 72 hours. Hgb A1c: No results for input(s): HGBA1C in the last 72 hours. Lipid Profile: No results for input(s): CHOL, HDL, LDLCALC, TRIG, CHOLHDL, LDLDIRECT in the last 72 hours. Thyroid function studies: No results for input(s): TSH, T4TOTAL, T3FREE, THYROIDAB in the last 72 hours.  Invalid input(s): FREET3 Anemia work  up: No results for input(s): VITAMINB12, FOLATE, FERRITIN, TIBC, IRON, RETICCTPCT in the last 72 hours. Sepsis Labs: Recent Labs  Lab 04/09/21 1521 04/09/21 1927 04/10/21 0153 04/10/21 1417 04/11/21 0412 04/12/21 0909  PROCALCITON  --   --   --  <0.10  --   --   WBC 8.2  --  5.6  --  11.0* 10.1  LATICACIDVEN 3.2* 2.9*  --   --   --   --     Microbiology Recent Results (from the past 240 hour(s))  Culture, blood (routine x 2)     Status: None (Preliminary result)   Collection Time: 04/09/21  3:22 PM   Specimen: BLOOD  Result Value Ref  Range Status   Specimen Description BLOOD RIGHT ANTECUBITAL  Final   Special Requests   Final    BOTTLES DRAWN AEROBIC AND ANAEROBIC Blood Culture adequate volume   Culture   Final    NO GROWTH 3 DAYS Performed at [facility], [address] [place], [place] [address]    Report Status PENDING  Incomplete  Resp Panel by RT-PCR (Flu A&B, Covid) Nasopharyngeal Swab     Status: None   Collection Time: 04/09/21  4:11 PM   Specimen: Nasopharyngeal Swab; Nasopharyngeal(NP) swabs in vial transport medium  Result Value Ref Range Status   SARS Coronavirus 2 by RT PCR NEGATIVE NEGATIVE Final    Comment: (NOTE) SARS-CoV-2 target nucleic acids are NOT DETECTED.  The SARS-CoV-2 RNA is generally detectable in upper respiratory specimens during the acute phase of infection. The lowest concentration of SARS-CoV-2 viral copies this assay can detect is 138 copies/mL. A negative result does not preclude SARS-Cov-2 infection and should not be used as the sole basis for treatment or other patient management decisions. A negative result may occur with  improper specimen collection/handling, submission of specimen other than nasopharyngeal swab, presence of viral mutation(s) within the areas targeted by this assay, and inadequate number of viral copies(<138 copies/mL). A negative result must be combined with clinical observations, patient history, and  epidemiological information. The expected result is Negative.  Fact Sheet for Patients:  EntrepreneurPulse.com.au  Fact Sheet for Healthcare Providers:  IncredibleEmployment.be  This test is no t yet approved or cleared by the [place] FDA and  has been authorized for detection and/or diagnosis of SARS-CoV-2 by FDA under an Emergency Use Authorization (EUA). This EUA will remain  in effect (meaning this test can be used) for the duration of the COVID-19 declaration under Section 564(b)(1) of the Act, 21 U.S.C.section 360bbb-3(b)(1), unless the authorization is terminated  or revoked sooner.       Influenza A by PCR NEGATIVE NEGATIVE Final   Influenza B by PCR NEGATIVE NEGATIVE Final    Comment: (NOTE) The Xpert Xpress SARS-CoV-2/FLU/RSV plus assay is intended as an aid in the diagnosis of influenza from Nasopharyngeal swab specimens and should not be used as a sole basis for treatment. Nasal washings and aspirates are unacceptable for Xpert Xpress SARS-CoV-2/FLU/RSV testing.  Fact Sheet for Patients: EntrepreneurPulse.com.au  Fact Sheet for Healthcare Providers: IncredibleEmployment.be  This test is not yet approved or cleared by the [place] FDA and has been authorized for detection and/or diagnosis of SARS-CoV-2 by FDA under an Emergency Use Authorization (EUA). This EUA will remain in effect (meaning this test can be used) for the duration of the COVID-19 declaration under Section 564(b)(1) of the Act, 21 U.S.C. section 360bbb-3(b)(1), unless the authorization is terminated or revoked.  Performed at [facility], [place] [address], [place], [place] [address]   Culture, blood (routine x 2)     Status: None (Preliminary result)   Collection Time: 04/09/21  4:13 PM   Specimen: BLOOD  Result Value Ref Range Status   Specimen Description BLOOD SITE NOT SPECIFIED  Final   Special Requests    Final    BOTTLES DRAWN AEROBIC AND ANAEROBIC Blood Culture results may not be optimal due to an inadequate volume of blood received in culture bottles   Culture   Final    NO GROWTH 3 DAYS Performed at [facility], [place] [address], [place], [place] [address]    Report Status PENDING  Incomplete  Procedures and diagnostic studies:  No results found.  Medications:   . aspirin EC  81 mg Oral Daily  . atorvastatin  40 mg Oral QHS  . bisoprolol  10 mg Oral BID  . clopidogrel  75 mg Oral Daily  . doxycycline  100 mg Oral Q12H  . enoxaparin (LOVENOX) injection  40 mg Subcutaneous Q24H  . feeding supplement (GLUCERNA SHAKE)  237 mL Oral TID BM  . fluticasone furoate-vilanterol  1 puff Inhalation Daily  . guaiFENesin  1,200 mg Oral BID  . insulin aspart  0-15 Units Subcutaneous TID WC  . insulin aspart  0-5 Units Subcutaneous QHS  . insulin aspart  8 Units Subcutaneous TID WC  . insulin glargine  10 Units Subcutaneous QHS  . ipratropium-albuterol  3 mL Nebulization Q6H  . magnesium oxide  400 mg Oral Daily  . multivitamin with minerals  1 tablet Oral Daily  . pantoprazole  40 mg Oral QAC breakfast  . potassium chloride  20 mEq Oral Daily  . predniSONE  40 mg Oral Q breakfast  . umeclidinium bromide  1 puff Inhalation Daily   Continuous Infusions:    LOS: 3 days   Darliss Cheney , MD [facility]   How to contact the [facility] Attending or Consulting provider [place] or covering provider during after hours [place], for this patient?  1. Check the care team in [facility] and look for a) attending/consulting [facility] provider listed and b) the [facility] team listed 2. Log into [email or web address] and use [place]'s universal password to access. If you do not have the password, please contact the hospital operator. 3. Locate the [facility] provider you are looking for under [facility] and page to a number that you can be directly reached. 4. If you still have difficulty reaching the provider,  please page the [facility] (Director on Call) for the Hospitalists listed on amion for assistance.  04/12/2021, 1:42 PM

## 2021-04-12 NOTE — Progress Notes (Signed)
NAME:  Brandon [name], MRN:  [id number], DOB:  1946/07/31, LOS: 3 ADMISSION DATE:  04/09/2021, CONSULTATION DATE:  04/10/21 REFERRING MD:  Eliseo Squires - TR[facility], CHIEF COMPLAINT:  Chronic hypoxic respiratory failure  History of Present Illness:  75 yo PMH COPD followed at the [place], chronic hypoxic respiratory failure on 2.5LNC, chronic diastolic heart failure, PVD, CAD who presented to ED 4/10 with CC SOB. Taken to ED by EMS who noted pts tachypnea and hypoxia requiring 5L O2. No associated chest pain, URI sx, fever, chills. In ED CXR most consistent with pulmonary edema. Pt placed on BiPAP, admitted to [facility] for suspected heart failure and COPD exacerbation.  Pulmonary consulted for consideration of possible home BiPAP utility   Pertinent  Medical History  COPD PAD PVD  Chronic diastolic heart failure   Significant Hospital Events: Including procedures, antibiotic start and stop dates in addition to other pertinent events   . 4/10 admitted to [facility]. Started on doxy, solumedrol, lasix  . 4/11 pulm consult   Interim History / Subjective:  Breathing better today. Remains on 5 L [place].   Objective   Blood pressure 122/76, pulse 80, temperature 98.4 F (36.9 C), temperature source Oral, resp. rate 18, weight 74.4 kg, SpO2 100 %.        Intake/Output Summary (Last 24 hours) at 04/12/2021 0826 Last data filed at 04/12/2021 5809 Gross per 24 hour  Intake 240 ml  Output 2275 ml  Net -2035 ml   Filed Weights   04/09/21 2224  Weight: 74.4 kg    Examination: General:  Elderly appearing male, HOH , in NAD Neuro:  Alert, oriented, non-focal HEENT:  [place]/AT, No JVD noted, PERRL Cardiovascular:  RRR, no MRG Lungs:  Clear bilateral breath sounds on 5L [place] no distress Abdomen:  Soft, non-distended, non-tender.  Musculoskeletal:  No acute deformity or ROM limitation. Trace edema.  Skin:  Intact, MMM   Labs/imaging that I havepersonally reviewed  (right click and "Reselect all SmartList Selections" daily)   CXR 4/11- bilateral opacities   4/11- WBC 5.6. Cr 1.41  Resolved Hospital Problem list     Assessment & Plan:   Acute on chronic respiratory failure 2/2 pulmonary edema due to acute diastolic HF +/- COPD exacerbation.   Has reported h/o COPD for which he is followed for by the [place]. We do not have PFTs to confirm this. He certainly could have Nocturnal hypoxia and or worse baseline hypoxia w/ exertion then what was identified at baseline which would predispose him to exacerbating his HF. He has improved quite a bit, most likely due to diuresis.    Plan/rec - Diurese as renal function will tolerate.  - Cont supplemental oxygen->make efforts to wean. He remains on 5L with O2 sats 100% at last check. O2 sat goal 90-95% is reasonable.  - Home on his Spiriva, advair and PRN albuterol. He feels like he does better w/ nebulized therapy. Would consider albuterol NEB as a PRN alternative  - Prednisone 40mg  x 2 more days.  - Normal ventilation on AM ABG after not wearing BiPAP overnight. It is unlikely he will qualify or even needs nocturnal BiPAP. He was hypoxic so perhaps CPAP would be more appropriate. He will need outpatient polysomnogram. We will be happy to arrange pulmonary follow-up as he nears discahrge   Best practice (right click and "Reselect all SmartList Selections" daily)  Per primary    Georgann Housekeeper, AGACNP-BC [place] for personal pager PCCM on  call pager [phone] until 7pm. Please call Elink 7p-7a. [phone]  04/12/2021 8:41 AM

## 2021-04-13 LAB — COMPREHENSIVE METABOLIC PANEL
ALT: 33 U/L (ref 0–44)
AST: 24 U/L (ref 15–41)
Albumin: 2.4 g/dL — ABNORMAL LOW (ref 3.5–5.0)
Alkaline Phosphatase: 58 U/L (ref 38–126)
Anion gap: 7 (ref 5–15)
BUN: 31 mg/dL — ABNORMAL HIGH (ref 8–23)
CO2: 27 mmol/L (ref 22–32)
Calcium: 9 mg/dL (ref 8.9–10.3)
Chloride: 102 mmol/L (ref 98–111)
Creatinine, Ser: 0.96 mg/dL (ref 0.61–1.24)
GFR, Estimated: >60 mL/min (ref >60)
Glucose, Bld: 203 mg/dL — ABNORMAL HIGH (ref 70–99)
Potassium: 3.9 mmol/L (ref 3.5–5.1)
Sodium: 136 mmol/L (ref 135–145)
Total Bilirubin: 0.7 mg/dL (ref 0.3–1.2)
Total Protein: 5.1 g/dL — ABNORMAL LOW (ref 6.5–8.1)

## 2021-04-13 LAB — GLUCOSE, CAPILLARY
Glucose-Capillary: 132 mg/dL — ABNORMAL HIGH (ref 70–99)
Glucose-Capillary: 197 mg/dL — ABNORMAL HIGH (ref 70–99)
Glucose-Capillary: 216 mg/dL — ABNORMAL HIGH (ref 70–99)
Glucose-Capillary: 100 mg/dL — ABNORMAL HIGH (ref 70–99)

## 2021-04-13 MED ORDER — FUROSEMIDE 10 MG/ML IJ SOLN
60.0000 mg | Freq: Once | INTRAMUSCULAR | Status: AC
  Administered 2021-04-13: 60 mg via INTRAVENOUS
  Filled 2021-04-13: qty 6

## 2021-04-13 MED ORDER — INSULIN GLARGINE 100 UNIT/ML ~~LOC~~ SOLN
15.0000 [IU] | Freq: Every day | SUBCUTANEOUS | Status: DC
  Administered 2021-04-13: 15 [IU] via SUBCUTANEOUS
  Filled 2021-04-13 (×2): qty 0.15

## 2021-04-13 NOTE — Progress Notes (Signed)
Progress Note    Brandon [name]  FKC:[id number] DOB: 1946/01/02  DOA: 04/09/2021 PCP: Pcp, No    Brief Narrative:     Medical records reviewed and are as summarized below:  Brandon [name] is an 75 y.o. male with medical history significant of COPD, chronic hypoxic respiratory on 2.5 L baseline, chronic diastolic CHF, PVD, IDDM, history of noncompliant with home medications, presented with increasing shortness of breath and cough.   Assessment/Plan:   Active Problems:   COPD with acute exacerbation (HCC)   COPD (chronic obstructive pulmonary disease) (HCC)   Acute on chronic hypoxic respiratory failure secondary to combination of acute COPD exacerbation and acute on chronic diastolic congestive heart failure: Baseline oxygen requirement for patient is 4.5 L.  He was still requiring 6 L of oxygen at rest when I saw him this morning and was dyspneic and tachypneic and was unable to speak in full sentences.  It appears that his ambulatory oximetry was checked later and surprisingly enough, his oxygen saturation was over 90% on room air.  His x-ray from yesterday shows improving pulmonary edema.  I would like to keep him 1 more day, I have ordered 1 more dose of IV Lasix 60 mg.  Would like to diurese him more.  Continue prednisone for 1 more day.   -pulm consult appreciated.  AKI: Resolved.    Troponins elevation -Denied any chest pains, suspect troponin elevation secondary to AKI and demanding ischemia from COPD exacerbation  Elevated lactic acid level -improved  Sinus arrhythmia -Similar EKG presentation as compared to last month. -episodes of SVT- was taken off cardizem at admission  DM type 2 -D/C Metformin - Sliding scale Still hyperglycemic but better.  Increase Lantus to 15 units and continue SSI.  CAD -Continue aspirin, Plavix and statin  Was seen by palliative care last admission but it appears that outpatient palliative care was not ordered at d/c-- will  defer to PCP   Family Communication/Anticipated D/C date and plan/Code Status   DVT prophylaxis: Lovenox ordered. Code Status: dnr Disposition Plan: Status is: Inpatient Family communication: Discussed with daughter over the phone again today.  She was appreciative of the call. Remains inpatient appropriate because:Inpatient level of care appropriate due to severity of illness   Dispo: The patient is from: Home?              Anticipated d/c is to: home,               Patient currently is not medically stable to d/c.  Potentially home in AM if continues to improve   Difficult to place patient No         Medical Consultants:    pulm  Subjective:   Patient seen and examined early morning.  Although he denied having shortness of breath but he was visibly dyspneic and was not able to speak in full sentences.  He was requiring 6 L of oxygen at that point in time.  Objective:    Vitals:   04/13/21 0635 04/13/21 0719 04/13/21 0816 04/13/21 1130  BP: 127/71 123/74  118/60  Pulse: 73 70  72  Resp: 19 18  (!) 22  Temp: 98.3 F (36.8 C) 98.2 F (36.8 C)  98.3 F (36.8 C)  TempSrc: Oral Oral  Oral  SpO2: 98% 96% 97% 100%  Weight:        Intake/Output Summary (Last 24 hours) at 04/13/2021 1321 Last data filed at 04/13/2021 0358 Gross per 24 hour  Intake 480 ml  Output 2900 ml  Net -2420 ml   Filed Weights   04/09/21 2224  Weight: 74.4 kg   General exam: Appears calm and comfortable  Respiratory system: Diminished breath sounds with faint bibasilar crackles. Respiratory effort normal. Cardiovascular system: S1 & S2 heard, RRR. No JVD, murmurs, rubs, gallops or clicks. No pedal edema. Gastrointestinal system: Abdomen is nondistended, soft and nontender. No organomegaly or masses felt. Normal bowel sounds heard. Central nervous system: Alert and oriented. No focal neurological deficits. Extremities: Symmetric 5 x 5 power. Skin: No rashes, lesions or ulcers.   Psychiatry: Judgement and insight appear normal. Mood & affect appropriate.    Data Reviewed:   I have personally reviewed following labs and imaging studies:  Labs: Labs show the following:   Basic Metabolic Panel: Recent Labs  Lab 04/09/21 1521 04/09/21 1927 04/10/21 0153 04/11/21 0412 04/12/21 0909 04/13/21 0209  NA 137  --  137 135 136 136  K 3.2*  --  4.1 4.4 3.7 3.9  CL 97*  --  98 99 99 102  CO2 27  --  23 28 30 27   GLUCOSE 100*  --  282* 308* 218* 203*  BUN 18  --  20 28* 30* 31*  CREATININE 1.44*  --  1.41* 1.56* 1.10 0.96  CALCIUM 8.9  --  8.6* 8.8* 8.9 9.0  MG  --  1.8  --   --  2.0  --   PHOS  --  3.5  --   --   --   --    GFR Estimated Creatinine Clearance: 70 mL/min (by C-G formula based on SCr of 0.96 mg/dL). Liver Function Tests: Recent Labs  Lab 04/09/21 1521 04/13/21 0209  AST 16 24  ALT 16 33  ALKPHOS 63 58  BILITOT 0.9 0.7  PROT 6.4* 5.1*  ALBUMIN 2.7* 2.4*   No results for input(s): LIPASE, AMYLASE in the last 168 hours. No results for input(s): AMMONIA in the last 168 hours. Coagulation profile No results for input(s): INR, PROTIME in the last 168 hours.  CBC: Recent Labs  Lab 04/09/21 1521 04/10/21 0153 04/11/21 0412 04/12/21 0909  WBC 8.2 5.6 11.0* 10.1  HGB 11.3* 10.4* 9.6* 10.8*  HCT 36.7* 33.6* 30.9* 35.3*  MCV 88.2 87.3 87.3 87.4  PLT 451* 417* 441* 495*   Cardiac Enzymes: No results for input(s): CKTOTAL, CKMB, CKMBINDEX, TROPONINI in the last 168 hours. BNP (last 3 results) No results for input(s): PROBNP in the last 8760 hours. CBG: Recent Labs  Lab 04/12/21 1632 04/12/21 1925 04/12/21 2135 04/13/21 0718 04/13/21 1119  GLUCAP 203* 340* 268* 132* 197*   D-Dimer: No results for input(s): DDIMER in the last 72 hours. Hgb A1c: No results for input(s): HGBA1C in the last 72 hours. Lipid Profile: No results for input(s): CHOL, HDL, LDLCALC, TRIG, CHOLHDL, LDLDIRECT in the last 72 hours. Thyroid function  studies: No results for input(s): TSH, T4TOTAL, T3FREE, THYROIDAB in the last 72 hours.  Invalid input(s): FREET3 Anemia work up: No results for input(s): VITAMINB12, FOLATE, FERRITIN, TIBC, IRON, RETICCTPCT in the last 72 hours. Sepsis Labs: Recent Labs  Lab 04/09/21 1521 04/09/21 1927 04/10/21 0153 04/10/21 1417 04/11/21 0412 04/12/21 0909  PROCALCITON  --   --   --  <0.10  --   --   WBC 8.2  --  5.6  --  11.0* 10.1  LATICACIDVEN 3.2* 2.9*  --   --   --   --  Microbiology Recent Results (from the past 240 hour(s))  Culture, blood (routine x 2)     Status: None (Preliminary result)   Collection Time: 04/09/21  3:22 PM   Specimen: BLOOD  Result Value Ref Range Status   Specimen Description BLOOD RIGHT ANTECUBITAL  Final   Special Requests   Final    BOTTLES DRAWN AEROBIC AND ANAEROBIC Blood Culture adequate volume   Culture   Final    NO GROWTH 4 DAYS Performed at [facility], [address] [place], [place] [address]    Report Status PENDING  Incomplete  Resp Panel by RT-PCR (Flu A&B, Covid) Nasopharyngeal Swab     Status: None   Collection Time: 04/09/21  4:11 PM   Specimen: Nasopharyngeal Swab; Nasopharyngeal(NP) swabs in vial transport medium  Result Value Ref Range Status   SARS Coronavirus 2 by RT PCR NEGATIVE NEGATIVE Final    Comment: (NOTE) SARS-CoV-2 target nucleic acids are NOT DETECTED.  The SARS-CoV-2 RNA is generally detectable in upper respiratory specimens during the acute phase of infection. The lowest concentration of SARS-CoV-2 viral copies this assay can detect is 138 copies/mL. A negative result does not preclude SARS-Cov-2 infection and should not be used as the sole basis for treatment or other patient management decisions. A negative result may occur with  improper specimen collection/handling, submission of specimen other than nasopharyngeal swab, presence of viral mutation(s) within the areas targeted by this assay, and inadequate  number of viral copies(<138 copies/mL). A negative result must be combined with clinical observations, patient history, and epidemiological information. The expected result is Negative.  Fact Sheet for Patients:  EntrepreneurPulse.com.au  Fact Sheet for Healthcare Providers:  IncredibleEmployment.be  This test is no t yet approved or cleared by the [place] FDA and  has been authorized for detection and/or diagnosis of SARS-CoV-2 by FDA under an Emergency Use Authorization (EUA). This EUA will remain  in effect (meaning this test can be used) for the duration of the COVID-19 declaration under Section 564(b)(1) of the Act, 21 U.S.C.section 360bbb-3(b)(1), unless the authorization is terminated  or revoked sooner.       Influenza A by PCR NEGATIVE NEGATIVE Final   Influenza B by PCR NEGATIVE NEGATIVE Final    Comment: (NOTE) The Xpert Xpress SARS-CoV-2/FLU/RSV plus assay is intended as an aid in the diagnosis of influenza from Nasopharyngeal swab specimens and should not be used as a sole basis for treatment. Nasal washings and aspirates are unacceptable for Xpert Xpress SARS-CoV-2/FLU/RSV testing.  Fact Sheet for Patients: EntrepreneurPulse.com.au  Fact Sheet for Healthcare Providers: IncredibleEmployment.be  This test is not yet approved or cleared by the [place] FDA and has been authorized for detection and/or diagnosis of SARS-CoV-2 by FDA under an Emergency Use Authorization (EUA). This EUA will remain in effect (meaning this test can be used) for the duration of the COVID-19 declaration under Section 564(b)(1) of the Act, 21 U.S.C. section 360bbb-3(b)(1), unless the authorization is terminated or revoked.  Performed at [facility], [place] [address] [place], [place] [address]   Culture, blood (routine x 2)     Status: None (Preliminary result)   Collection Time: 04/09/21  4:13 PM    Specimen: BLOOD  Result Value Ref Range Status   Specimen Description BLOOD SITE NOT SPECIFIED  Final   Special Requests   Final    BOTTLES DRAWN AEROBIC AND ANAEROBIC Blood Culture results may not be optimal due to an inadequate volume of blood  received in culture bottles   Culture   Final    NO GROWTH 4 DAYS Performed at [facility], [place] [address] [place], [place] [address]    Report Status PENDING  Incomplete    Procedures and diagnostic studies:  DG CHEST PORT 1 VIEW  Result Date: 04/12/2021 CLINICAL DATA:  CHF EXAM: PORTABLE CHEST 1 VIEW COMPARISON:  04/10/2021 FINDINGS: Cardiac shadow is enlarged but stable. Aortic calcifications are again seen. Persistent vascular congestion is noted although the degree of interstitial edema has improved from the prior exam. No sizable effusion is noted. No bony abnormality is seen. IMPRESSION: Improving edema when compared with the prior exam. Electronically Signed   By: Inez Catalina M.D.   On: 04/12/2021 15:54    Medications:   . aspirin EC  81 mg Oral Daily  . atorvastatin  40 mg Oral QHS  . bisoprolol  10 mg Oral BID  . clopidogrel  75 mg Oral Daily  . doxycycline  100 mg Oral Q12H  . enoxaparin (LOVENOX) injection  40 mg Subcutaneous Q24H  . feeding supplement (GLUCERNA SHAKE)  237 mL Oral TID BM  . fluticasone furoate-vilanterol  1 puff Inhalation Daily  . furosemide  60 mg Intravenous Once  . guaiFENesin  1,200 mg Oral BID  . insulin aspart  0-15 Units Subcutaneous TID WC  . insulin aspart  0-5 Units Subcutaneous QHS  . insulin aspart  8 Units Subcutaneous TID WC  . insulin glargine  10 Units Subcutaneous QHS  . ipratropium-albuterol  3 mL Nebulization Q6H  . magnesium oxide  400 mg Oral Daily  . multivitamin with minerals  1 tablet Oral Daily  . pantoprazole  40 mg Oral QAC breakfast  . potassium chloride  20 mEq Oral Daily  . predniSONE  40 mg Oral Q breakfast  . umeclidinium bromide  1 puff Inhalation Daily    Continuous Infusions:    LOS: 4 days   Darliss Cheney , MD [facility]   How to contact the [facility] Attending or Consulting provider [place] or covering provider during after hours [place], for this patient?  1. Check the care team in [facility] and look for a) attending/consulting TRH provider listed and b) the [facility] team listed 2. Log into [email or web address] and use [place]'s universal password to access. If you do not have the password, please contact the hospital operator. 3. Locate the [facility] provider you are looking for under [facility] and page to a number that you can be directly reached. 4. If you still have difficulty reaching the provider, please page the [facility] (Director on Call) for the Hospitalists listed on amion for assistance.  04/13/2021, 1:21 PM

## 2021-04-13 NOTE — Progress Notes (Signed)
Occupational Therapy Treatment Patient Details Name: Fedrick Cefalu MRN: [id number] DOB: 12-21-1946 Today's Date: 04/13/2021    History of present illness Taylon Coole is a 75 y.o. male who presented with increasing shortness of breath and cough.  PMH: COPD, 4.5-5 L baseline, chronic diastolic CHF, PVD, IDDM, history of noncompliant with home medications   OT comments  Pt sitting in recliner upon arrival, agreeable to OT session. Pt on 6lnc upon arrival, SpO2 100%. Pt required minguard for functional mobility at RW level, pt with increased DoE 3/4 after min5 standing task. SpO2 wave form poor, reading was 77%-84% SpO2 on 6lnc. Pt required seated rest break with pursed lip breathing. Educated pt on energy conservation strategies with provided handout. Pt will continue to benefit from skilled OT services to maximize safety and independence with ADL/IADL and functional mobility. Will continue to follow acutely and progress as tolerated.    Follow Up Recommendations  Home health OT;Supervision - Intermittent    Equipment Recommendations  None recommended by OT    Recommendations for Other Services      Precautions / Restrictions Precautions Precautions: Fall Precaution Comments: watch O2,  4.5LO2 at baseline Restrictions Weight Bearing Restrictions: No       Mobility Bed Mobility               General bed mobility comments: pt sitting in recliner upon arrival    Transfers Overall transfer level: Needs assistance Equipment used: Rolling walker (2 wheeled) Transfers: Sit to/from Stand Sit to Stand: Min guard         General transfer comment: minguard for safety    Balance Overall balance assessment: Needs assistance Sitting-balance support: Feet supported;No upper extremity supported Sitting balance-Leahy Scale: Good     Standing balance support: No upper extremity supported;Single extremity supported;During functional activity Standing balance-Leahy Scale:  Poor Standing balance comment: demonstrates improved stability with BUE support, but able to standing stand without UE support                           ADL either performed or assessed with clinical judgement   ADL Overall ADL's : Needs assistance/impaired Eating/Feeding: Independent;Sitting   Grooming: Min guard;Standing Grooming Details (indicate cue type and reason): limited standing tolerance, requires sitting/standing             Lower Body Dressing: Min guard;Sit to/from stand Lower Body Dressing Details (indicate cue type and reason): pt able to figure-4 to simulated donning/doffing socks             Functional mobility during ADLs: Min guard;Rolling walker General ADL Comments: pt limited by decreased activity tolerance, pt requires minguard for safety with mobility, educated pt on energy conservation strategies with provided handout. Pt eager to return home and expressed frustration regarding readmissions, discussed importance of monitoring SpO2 during ADL, activity progression and importance of mobility     Vision   Vision Assessment?: No apparent visual deficits   Perception     Praxis      Cognition Arousal/Alertness: Awake/alert Behavior During Therapy: WFL for tasks assessed/performed Overall Cognitive Status: Within Functional Limits for tasks assessed                                 General Comments: A&Ox4, irritable about frequent readmissions        Exercises Exercises: General Upper Extremity;Other exercises General Exercises - Upper Extremity Shoulder Flexion:  AROM;10 reps;Standing;Both Shoulder Extension: AROM;10 reps;Standing;Both Elbow Flexion: AROM;10 reps;Standing;Both Elbow Extension: AROM;10 reps;Standing;Both Other Exercises Other Exercises: scapular retraction x10   Shoulder Instructions       General Comments Pt on 6lnc upon arrival, SpO2 100% at rest, pt desaturated with standing, unable to get a good  waveform, but reading was 77%-84%, pt with increased DoE 3/4 with 5 min standing activity    Pertinent Vitals/ Pain       Pain Assessment: No/denies pain Faces Pain Scale: No hurt  Home Living                                          Prior Functioning/Environment              Frequency  Min 2X/week        Progress Toward Goals  OT Goals(current goals can now be found in the care plan section)  Progress towards OT goals: Progressing toward goals  Acute Rehab OT Goals Patient Stated Goal: breathe easier and figure out why I can't breathe OT Goal Formulation: With patient Potential to Achieve Goals: Good ADL Goals Pt Will Perform Lower Body Dressing: with min guard assist;sitting/lateral leans;sit to/from stand Pt/caregiver will Perform Home Exercise Program: Increased strength;Both right and left upper extremity;With Supervision Additional ADL Goal #1: Pt to state 3 energy conservation strategies for ADL and mobility to increase activity tolerance. Additional ADL Goal #2: Pt will increase to standing at sink x10 mins with 1 seated rest break and O2 sats >90%.  Plan Discharge plan remains appropriate    Co-evaluation                 AM-PAC OT "6 Clicks" Daily Activity     Outcome Measure   Help from another person eating meals?: None Help from another person taking care of personal grooming?: A Little Help from another person toileting, which includes using toliet, bedpan, or urinal?: A Little Help from another person bathing (including washing, rinsing, drying)?: A Little Help from another person to put on and taking off regular upper body clothing?: None Help from another person to put on and taking off regular lower body clothing?: A Little 6 Click Score: 20    End of Session Equipment Utilized During Treatment: Gait belt;Rolling walker;Oxygen  OT Visit Diagnosis: Unsteadiness on feet (R26.81);Muscle weakness (generalized) (M62.81)    Activity Tolerance Patient tolerated treatment well   Patient Left in chair;with call bell/phone within reach;with chair alarm set   Nurse Communication Mobility status        Time: 6144-3154 OT Time Calculation (min): 16 min  Charges: OT General Charges $OT Visit: 1 Visit OT Treatments $Self Care/Home Management : 8-22 mins  Helene Kelp OTR/L Acute Rehabilitation Services Office: [place] 04/13/2021, 3:00 PM

## 2021-04-13 NOTE — Progress Notes (Signed)
SATURATION QUALIFICATIONS: (This note is used to comply with regulatory documentation for home oxygen)  Patient Saturations on Room Air at Rest = 98%  Patient Saturations on Room Air while Ambulating = 95%  Patient Saturations on 4 Liters of oxygen while Ambulating = 92%  Please briefly explain why patient needs home oxygen: home 4.5 L oxygen

## 2021-04-13 NOTE — Progress Notes (Signed)
Inpatient Diabetes Program Recommendations  AACE/ADA: New Consensus Statement on Inpatient Glycemic Control   Target Ranges:  Prepandial:   less than 140 mg/dL      Peak postprandial:   less than 180 mg/dL (1-2 hours)      Critically ill patients:  140 - 180 mg/dL  Results for SANTE, BIEDERMANN (MRN [id number]) as of 04/13/2021 08:33  Ref. Range 04/12/2021 07:40 04/12/2021 10:59 04/12/2021 16:32 04/12/2021 19:25 04/12/2021 21:35 04/13/2021 07:18  Glucose-Capillary Latest Ref Range: 70 - 99 mg/dL 188 (H) 190 (H) 203 (H) 340 (H) 268 (H) 132 (H)    Review of Glycemic Control  Diabetes history: DM2 Outpatient Diabetes medications: Jardiance 10 mg QAM, Metformin 1000 BID Current orders for Inpatient glycemic control:Lantus 10 units QHS, Novolog 8 units TID with mealsNovolog 0-15 units TID with meals, Novolog 0-5 units QHS; Prednisone 40 mg daily   Inpatient Diabetes Program Recommendations:    Insulin: If steroids are continued as ordered, please consider increasing meal coverage to Novolog 10 units TID with meals if patient eats at least 50% of meals.  Thanks, Barnie Alderman, RN, MSN, CDE Diabetes Coordinator Inpatient Diabetes Program [phone] (Team Pager from 8am to 5pm)

## 2021-04-14 LAB — GLUCOSE, CAPILLARY
Glucose-Capillary: 184 mg/dL — ABNORMAL HIGH (ref 70–99)
Glucose-Capillary: 189 mg/dL — ABNORMAL HIGH (ref 70–99)

## 2021-04-14 LAB — CULTURE, BLOOD (ROUTINE X 2)
Culture: NO GROWTH
Culture: NO GROWTH
Special Requests: ADEQUATE

## 2021-04-14 NOTE — TOC Transition Note (Signed)
Transition of Care [facility]) - CM/SW Discharge Note   Patient Details  Name: Brandon [name] MRN: [id number] Date of Birth: 1946/11/21  Transition of Care [facility]) CM/SW Contact:  Joanne Chars, LCSW Phone Number: 04/14/2021, 11:05 AM   Clinical Narrative:  Pt discharging home with [facility], CSW confirmed with Stacie that they will resume services.  CSW spoke with pt daughter Brandon [name] and confirmed that she can pick pt up and she will bring O2 with her for transport home.  CSW attempted to contact [place] patient to confirm home O2 service but was unable to speak to anyone.  CSW also spoke to [place] regarding PCP appt.  Pt is seen by Knox Saliva, NP, and is part of the home care program where the PCP services are provided in home.  CSW LM with Knox Saliva NP.  CSW spoke again with Brandon [name] regarding the above information.  She will follow up with Knox Saliva at [facility] for hospital discharge appt.  She said pt has "plenty of O2" at home and she has the contact info if needs arise.       Final next level of care: Home w Home Health Services Barriers to Discharge: Barriers Resolved   Patient Goals and CMS Choice Patient states their goals for this hospitalization and ongoing recovery are:: return home      Discharge Placement                       Discharge Plan and Services                DME Arranged: N/A         HH Arranged: PT,OT [place] Agency: [facility]formerly [employer]) Date [place]: 04/14/21 Time [place]: [phone] Representative spoke with at [place]: [place] ([place]) Interventions     Readmission Risk Interventions Readmission Risk Prevention Plan 02/24/2021 02/06/2021  Transportation Screening Complete Complete  PCP or Specialist Appt within 3-5 Days - Complete  HRI or [place] - Complete  Social Work Consult for [place] Planning/Counseling - Complete  Palliative Care  Screening - Not Applicable  Medication Review Press photographer) Complete Complete  PCP or Specialist appointment within 3-5 days of discharge (No Data) -  [place] or Home Care Consult Complete -  SW Recovery Care/Counseling Consult Complete -  Palliative Care Screening Not Applicable -  [place] Not Applicable -  Some recent data might be hidden

## 2021-04-14 NOTE — Progress Notes (Signed)
Pt was taken down in w/c via staff on 2L nasal cannula. Daughter arrived to pick up pt via private vehicle.

## 2021-04-14 NOTE — Discharge Summary (Signed)
Physician Discharge Summary  Brandon [name] HYW:[id number] DOB: Apr 30, 1946 DOA: 04/09/2021  PCP: Pcp, No  Admit date: 04/09/2021 Discharge date: 04/14/2021 30 Day Unplanned Readmission Risk Score   Flowsheet Row ED to Hosp-Admission (Current) from 04/09/2021 in [employer] [facility] [place] [facility]  30 Day Unplanned Readmission Risk Score (%) 34.76 Filed at 04/14/2021 0801     This score is the patient's risk of an unplanned readmission within 30 days of being discharged (0 -100%). The score is based on dignosis, age, lab data, medications, orders, and past utilization.   Low:  0-14.9   Medium: 15-21.9   High: 22-29.9   Extreme: 30 and above         Admitted From: Home Disposition: Home  Recommendations for Outpatient Follow-up:  1. Follow up with PCP in 1-2 weeks 2. Follow-up with primary cardiologist 3. Follow-up with primary pulmonologist 4. Please obtain BMP/CBC in one week 5. Please follow up with your PCP on the following pending results: Unresulted Labs (From admission, onward)         None        Home Health: Yes Equipment/Devices: Home oxygen  Discharge Condition: Stable CODE STATUS: DNR Diet recommendation: Low-sodium  Subjective: Seen and examined.  Remains symptom-free.  Denies shortness of breath.  Fully alert and oriented.  Able to speak in full sentences.  Brief/Interim Summary: Brandon [name] is an 75 y.o. male with medical history significant ofCOPD,chronic hypoxic respiratory on 4.5 L baseline, chronic diastolic CHF, PVD, IDDM, history of noncompliant with home medications, presented with increasing shortness of breath and cough and was diagnosed with acute on chronic hypoxic respiratory failure secondary to combination of acute COPD exacerbation and acute on chronic diastolic congestive heart failure.  [place] to hospital service.  He was started on diuresis, bronchodilators as well as steroids.  Also came in with AKI which resolved.  Had mild elevation of  troponin likely due to demand ischemia.  Lactic acidosis also resolved.  Over the course of last few days, patient has improved.  His oxygen demand has improved and he is now back to 4.5 L of oxygen which is his baseline.  He was evaluated by PT OT who recommended home health which is arranged for him.  For some reason that is not known to me, patient's diltiazem was held during this admission although he has a history of paroxysmal atrial tachycardia for which he is supposed to continue beta-blocker and diltiazem.  It is also documented in his chart that he also has history of paroxysmal atrial fibrillation however that was not noted during this admission.  Based off of that, I am resuming his diltiazem and beta-blocker.  Patient is stable and is going to be discharged home and resume all his home medications.  He has completed prednisone tapering doses as recommended by PCCM  Discharge Diagnoses:  Active Problems:   COPD with acute exacerbation ([place])   COPD (chronic obstructive pulmonary disease) ([place])    Discharge Instructions   Allergies as of 04/14/2021      Reactions   Hydrochlorothiazide Other (See Comments)   Hypokalemia (Pt claims not allergic 04/09/21)      Medication List    STOP taking these medications   naproxen sodium 220 MG tablet Commonly known as: ALEVE     TAKE these medications   albuterol 108 (90 Base) MCG/ACT inhaler Commonly known as: VENTOLIN HFA Inhale 2 puffs into the lungs every 4 (four) hours as needed for wheezing or shortness of breath.  aspirin 81 MG EC tablet Take 1 tablet (81 mg total) by mouth daily.   atorvastatin 40 MG tablet Commonly known as: LIPITOR Take 40 mg by mouth at bedtime.   bisoprolol 5 MG tablet Commonly known as: ZEBETA Take 1 tablet (5 mg total) by mouth daily.   clopidogrel 75 MG tablet Commonly known as: PLAVIX Take 1 tablet (75 mg total) by mouth daily.   diltiazem 240 MG 24 hr capsule Commonly known as: CARDIZEM  CD Take 1 capsule (240 mg total) by mouth daily.   Jardiance 10 MG Tabs tablet Generic drug: empagliflozin TAKE 1 TABLET (10 MG TOTAL) BY MOUTH DAILY.   empagliflozin 10 MG Tabs tablet Commonly known as: Jardiance Take 1 tablet (10 mg total) by mouth daily before breakfast.   feeding supplement (GLUCERNA SHAKE) Liqd Take 237 mLs by mouth 2 (two) times daily between meals.   Fluticasone-Salmeterol 250-50 MCG/DOSE Aepb Commonly known as: ADVAIR Inhale 1 puff into the lungs 2 (two) times daily.   furosemide 40 MG tablet Commonly known as: LASIX Take 1 tablet (40 mg total) by mouth 2 (two) times daily.   furosemide 40 MG tablet Commonly known as: LASIX TAKE 1 TABLET (40 MG TOTAL) BY MOUTH TWO TIMES DAILY.   insulin aspart protamine - aspart (70-30) 100 UNIT/ML FlexPen Commonly known as: NOVOLOG 70/30 MIX Inject 0.2 mLs (20 Units total) into the skin 2 (two) times daily with a meal.   Magnesium Oxide 420 MG Tabs Take 420 mg by mouth daily.   metFORMIN 1000 MG tablet Commonly known as: GLUCOPHAGE Take by mouth 2 (two) times daily with a meal.   multivitamin with minerals Tabs tablet Take 1 tablet by mouth daily.   nortriptyline 50 MG capsule Commonly known as: PAMELOR Take 50 mg by mouth at bedtime. For restless leg symdrome   Omega-3 1000 MG Caps Take 1,000 mg by mouth in the morning and at bedtime.   OXYGEN Inhale 4.5 L/min into the lungs continuous.   pantoprazole 40 MG tablet Commonly known as: PROTONIX Take 1 tablet (40 mg total) by mouth daily. What changed: when to take this   polyethylene glycol 17 g packet Commonly known as: MIRALAX / GLYCOLAX Take 17 g by mouth daily as needed (constipation). Mix with water or other liquid as instructed before drinking   potassium chloride 10 MEQ tablet Commonly known as: KLOR-CON Take 2 tablets (20 mEq total) by mouth daily. What changed: how much to take   potassium chloride 10 MEQ tablet Commonly known as:  KLOR-CON TAKE 2 TABLETS (20 MEQ TOTAL) BY MOUTH DAILY.   senna-docusate 8.6-50 MG tablet Commonly known as: Senokot-S Take 1 tablet by mouth at bedtime as needed for mild constipation. What changed: when to take this   Tiotropium Bromide Monohydrate 2.5 MCG/ACT Aers Inhale 2 puffs into the lungs daily.            Durable Medical Equipment  (From admission, onward)         Start     Ordered   04/11/21 1113  For home use only DME Nebulizer/meds  Once       Question Answer Comment  Patient needs a nebulizer to treat with the following condition COPD with hypoxia ([place])   Length of Need Lifetime      04/11/21 1113          Allergies  Allergen Reactions  . Hydrochlorothiazide Other (See Comments)    Hypokalemia (Pt claims not allergic 04/09/21)  Consultations: PCCM   Procedures/Studies: DG CHEST PORT 1 VIEW  Result Date: 04/12/2021 CLINICAL DATA:  CHF EXAM: PORTABLE CHEST 1 VIEW COMPARISON:  04/10/2021 FINDINGS: Cardiac shadow is enlarged but stable. Aortic calcifications are again seen. Persistent vascular congestion is noted although the degree of interstitial edema has improved from the prior exam. No sizable effusion is noted. No bony abnormality is seen. IMPRESSION: Improving edema when compared with the prior exam. Electronically Signed   By: Inez Catalina M.D.   On: 04/12/2021 15:54   DG Chest Port 1 View  Result Date: 04/10/2021 CLINICAL DATA:  CHF EXAM: PORTABLE CHEST 1 VIEW COMPARISON:  04/09/2021 FINDINGS: Interval worsening of bilateral airspace opacity. Heart size within normal limits. No significant pulmonary vascular congestion. IMPRESSION: Interval worsening of diffuse bilateral airspace opacities which may be due to CHF/fluid volume overload or multifocal pneumonia. Electronically Signed   By: Miachel Roux M.D.   On: 04/10/2021 07:58   DG Chest Port 1 View  Result Date: 04/09/2021 CLINICAL DATA:  Dyspnea. EXAM: PORTABLE CHEST 1 VIEW COMPARISON:   03/13/2021 FINDINGS: Lungs are hyperexpanded. Interstitial markings are diffusely coarsened with chronic features. Basilar predominant alveolar opacity again noted, less pronounced than on the prior study. No substantial pleural effusion. Cardiopericardial silhouette is at upper limits of normal for size. Bones are diffusely demineralized. IMPRESSION: Emphysema with persistent basilar predominant alveolar opacity, less pronounced than on the prior study. Imaging features suggest edema superimposed on chronic underlying interstitial lung disease. Electronically Signed   By: Misty Stanley M.D.   On: 04/09/2021 15:58      Discharge Exam: Vitals:   04/14/21 0742 04/14/21 0846  BP: 117/67   Pulse: 70   Resp: 17   Temp:    SpO2:  94%   Vitals:   04/14/21 0309 04/14/21 0500 04/14/21 0742 04/14/21 0846  BP: 130/82  117/67   Pulse: 75  70   Resp: 18  17   Temp: 98.2 F (36.8 C)     TempSrc: Oral     SpO2: 100%   94%  Weight:  74.5 kg      General: Pt is alert, awake, not in acute distress Cardiovascular: RRR, S1/S2 +, no rubs, no gallops Respiratory: Diminished breath sounds bilaterally, no wheezing, no rhonchi Abdominal: Soft, NT, ND, bowel sounds + Extremities: no edema, no cyanosis    The results of significant diagnostics from this hospitalization (including imaging, microbiology, ancillary and laboratory) are listed below for reference.     Microbiology: Recent Results (from the past 240 hour(s))  Culture, blood (routine x 2)     Status: None   Collection Time: 04/09/21  3:22 PM   Specimen: BLOOD  Result Value Ref Range Status   Specimen Description BLOOD RIGHT ANTECUBITAL  Final   Special Requests   Final    BOTTLES DRAWN AEROBIC AND ANAEROBIC Blood Culture adequate volume   Culture   Final    NO GROWTH 5 DAYS Performed at [facility], [address], [place], [place] [address]    Report Status 04/14/2021 FINAL  Final  Resp Panel by RT-PCR (Flu A&B, Covid)  Nasopharyngeal Swab     Status: None   Collection Time: 04/09/21  4:11 PM   Specimen: Nasopharyngeal Swab; Nasopharyngeal(NP) swabs in vial transport medium  Result Value Ref Range Status   SARS Coronavirus 2 by RT PCR NEGATIVE NEGATIVE Final    Comment: (NOTE) SARS-CoV-2 target nucleic acids are NOT DETECTED.  The SARS-CoV-2 RNA is generally  detectable in upper respiratory specimens during the acute phase of infection. The lowest concentration of SARS-CoV-2 viral copies this assay can detect is 138 copies/mL. A negative result does not preclude SARS-Cov-2 infection and should not be used as the sole basis for treatment or other patient management decisions. A negative result may occur with  improper specimen collection/handling, submission of specimen other than nasopharyngeal swab, presence of viral mutation(s) within the areas targeted by this assay, and inadequate number of viral copies(<138 copies/mL). A negative result must be combined with clinical observations, patient history, and epidemiological information. The expected result is Negative.  Fact Sheet for Patients:  EntrepreneurPulse.com.au  Fact Sheet for Healthcare Providers:  IncredibleEmployment.be  This test is no t yet approved or cleared by the [place] FDA and  has been authorized for detection and/or diagnosis of SARS-CoV-2 by FDA under an Emergency Use Authorization (EUA). This EUA will remain  in effect (meaning this test can be used) for the duration of the COVID-19 declaration under Section 564(b)(1) of the Act, 21 U.S.C.section 360bbb-3(b)(1), unless the authorization is terminated  or revoked sooner.       Influenza A by PCR NEGATIVE NEGATIVE Final   Influenza B by PCR NEGATIVE NEGATIVE Final    Comment: (NOTE) The Xpert Xpress SARS-CoV-2/FLU/RSV plus assay is intended as an aid in the diagnosis of influenza from Nasopharyngeal swab specimens and should not be  used as a sole basis for treatment. Nasal washings and aspirates are unacceptable for Xpert Xpress SARS-CoV-2/FLU/RSV testing.  Fact Sheet for Patients: EntrepreneurPulse.com.au  Fact Sheet for Healthcare Providers: IncredibleEmployment.be  This test is not yet approved or cleared by the [place] FDA and has been authorized for detection and/or diagnosis of SARS-CoV-2 by FDA under an Emergency Use Authorization (EUA). This EUA will remain in effect (meaning this test can be used) for the duration of the COVID-19 declaration under Section 564(b)(1) of the Act, 21 U.S.C. section 360bbb-3(b)(1), unless the authorization is terminated or revoked.  Performed at [facility], [place] [address] [place], [place] [address]   Culture, blood (routine x 2)     Status: None   Collection Time: 04/09/21  4:13 PM   Specimen: BLOOD  Result Value Ref Range Status   Specimen Description BLOOD SITE NOT SPECIFIED  Final   Special Requests   Final    BOTTLES DRAWN AEROBIC AND ANAEROBIC Blood Culture results may not be optimal due to an inadequate volume of blood received in culture bottles   Culture   Final    NO GROWTH 5 DAYS Performed at [facility], [place] [address] [place], [place] [address]    Report Status 04/14/2021 FINAL  Final     Labs: BNP (last 3 results) Recent Labs    03/16/21 0308 04/10/21 1417 04/12/21 0909  BNP 397.8* 290.4* 932.3*   Basic Metabolic Panel: Recent Labs  Lab 04/09/21 1521 04/09/21 1927 04/10/21 0153 04/11/21 0412 04/12/21 0909 04/13/21 0209  NA 137  --  137 135 136 136  K 3.2*  --  4.1 4.4 3.7 3.9  CL 97*  --  98 99 99 102  CO2 27  --  23 28 30 27   GLUCOSE 100*  --  282* 308* 218* 203*  BUN 18  --  20 28* 30* 31*  CREATININE 1.44*  --  1.41* 1.56* 1.10 0.96  CALCIUM 8.9  --  8.6* 8.8* 8.9 9.0  MG  --  1.8  --   --  2.0  --   PHOS  --  3.5  --   --   --   --    Liver Function Tests: Recent Labs   Lab 04/09/21 1521 04/13/21 0209  AST 16 24  ALT 16 33  ALKPHOS 63 58  BILITOT 0.9 0.7  PROT 6.4* 5.1*  ALBUMIN 2.7* 2.4*   No results for input(s): LIPASE, AMYLASE in the last 168 hours. No results for input(s): AMMONIA in the last 168 hours. CBC: Recent Labs  Lab 04/09/21 1521 04/10/21 0153 04/11/21 0412 04/12/21 0909  WBC 8.2 5.6 11.0* 10.1  HGB 11.3* 10.4* 9.6* 10.8*  HCT 36.7* 33.6* 30.9* 35.3*  MCV 88.2 87.3 87.3 87.4  PLT 451* 417* 441* 495*   Cardiac Enzymes: No results for input(s): CKTOTAL, CKMB, CKMBINDEX, TROPONINI in the last 168 hours. BNP: Invalid input(s): POCBNP CBG: Recent Labs  Lab 04/13/21 0718 04/13/21 1119 04/13/21 1620 04/13/21 2127 04/14/21 0740  GLUCAP 132* 197* 216* 100* 184*   D-Dimer No results for input(s): DDIMER in the last 72 hours. Hgb A1c No results for input(s): HGBA1C in the last 72 hours. Lipid Profile No results for input(s): CHOL, HDL, LDLCALC, TRIG, CHOLHDL, LDLDIRECT in the last 72 hours. Thyroid function studies No results for input(s): TSH, T4TOTAL, T3FREE, THYROIDAB in the last 72 hours.  Invalid input(s): FREET3 Anemia work up No results for input(s): VITAMINB12, FOLATE, FERRITIN, TIBC, IRON, RETICCTPCT in the last 72 hours. Urinalysis    Component Value Date/Time   COLORURINE YELLOW 01/07/2021 2010   APPEARANCEUR CLEAR 01/07/2021 2010   LABSPEC 1.006 01/07/2021 2010   PHURINE 5.0 01/07/2021 2010   GLUCOSEU NEGATIVE 01/07/2021 2010   HGBUR NEGATIVE 01/07/2021 2010   [place] NEGATIVE 01/07/2021 2010   [place] NEGATIVE 01/07/2021 2010   PROTEINUR NEGATIVE 01/07/2021 2010   NITRITE NEGATIVE 01/07/2021 2010   LEUKOCYTESUR NEGATIVE 01/07/2021 2010   Sepsis Labs Invalid input(s): PROCALCITONIN,  WBC,  LACTICIDVEN Microbiology Recent Results (from the past 240 hour(s))  Culture, blood (routine x 2)     Status: None   Collection Time: 04/09/21  3:22 PM   Specimen: BLOOD  Result Value Ref Range Status    Specimen Description BLOOD RIGHT ANTECUBITAL  Final   Special Requests   Final    BOTTLES DRAWN AEROBIC AND ANAEROBIC Blood Culture adequate volume   Culture   Final    NO GROWTH 5 DAYS Performed at [facility], [address], [place], [place] [address]    Report Status 04/14/2021 FINAL  Final  Resp Panel by RT-PCR (Flu A&B, Covid) Nasopharyngeal Swab     Status: None   Collection Time: 04/09/21  4:11 PM   Specimen: Nasopharyngeal Swab; Nasopharyngeal(NP) swabs in vial transport medium  Result Value Ref Range Status   SARS Coronavirus 2 by RT PCR NEGATIVE NEGATIVE Final    Comment: (NOTE) SARS-CoV-2 target nucleic acids are NOT DETECTED.  The SARS-CoV-2 RNA is generally detectable in upper respiratory specimens during the acute phase of infection. The lowest concentration of SARS-CoV-2 viral copies this assay can detect is 138 copies/mL. A negative result does not preclude SARS-Cov-2 infection and should not be used as the sole basis for treatment or other patient management decisions. A negative result may occur with  improper specimen collection/handling, submission of specimen other than nasopharyngeal swab, presence of viral mutation(s) within the areas targeted by this assay, and inadequate number of viral copies(<138 copies/mL). A negative result must be combined with clinical observations, patient history,  and epidemiological information. The expected result is Negative.  Fact Sheet for Patients:  EntrepreneurPulse.com.au  Fact Sheet for Healthcare Providers:  IncredibleEmployment.be  This test is no t yet approved or cleared by the [place] FDA and  has been authorized for detection and/or diagnosis of SARS-CoV-2 by FDA under an Emergency Use Authorization (EUA). This EUA will remain  in effect (meaning this test can be used) for the duration of the COVID-19 declaration under Section 564(b)(1) of the Act,  21 U.S.C.section 360bbb-3(b)(1), unless the authorization is terminated  or revoked sooner.       Influenza A by PCR NEGATIVE NEGATIVE Final   Influenza B by PCR NEGATIVE NEGATIVE Final    Comment: (NOTE) The Xpert Xpress SARS-CoV-2/FLU/RSV plus assay is intended as an aid in the diagnosis of influenza from Nasopharyngeal swab specimens and should not be used as a sole basis for treatment. Nasal washings and aspirates are unacceptable for Xpert Xpress SARS-CoV-2/FLU/RSV testing.  Fact Sheet for Patients: EntrepreneurPulse.com.au  Fact Sheet for Healthcare Providers: IncredibleEmployment.be  This test is not yet approved or cleared by the [place] FDA and has been authorized for detection and/or diagnosis of SARS-CoV-2 by FDA under an Emergency Use Authorization (EUA). This EUA will remain in effect (meaning this test can be used) for the duration of the COVID-19 declaration under Section 564(b)(1) of the Act, 21 U.S.C. section 360bbb-3(b)(1), unless the authorization is terminated or revoked.  Performed at [facility], [place] [address], [place], [place] [address]   Culture, blood (routine x 2)     Status: None   Collection Time: 04/09/21  4:13 PM   Specimen: BLOOD  Result Value Ref Range Status   Specimen Description BLOOD SITE NOT SPECIFIED  Final   Special Requests   Final    BOTTLES DRAWN AEROBIC AND ANAEROBIC Blood Culture results may not be optimal due to an inadequate volume of blood received in culture bottles   Culture   Final    NO GROWTH 5 DAYS Performed at [facility], [place] [address] [place], [place] [address]    Report Status 04/14/2021 FINAL  Final     Time coordinating discharge: Over 30 minutes  SIGNED:   Darliss Cheney, MD  [facility] 04/14/2021, 10:14 AM  If 7PM-7AM, please contact night-coverage [email or web address]

## 2021-04-14 NOTE — Progress Notes (Signed)
D/c instructions provided to daughter over the phone. Also provided to pt. They have no questions at this time. Awaiting for daughter to pick up pt.

## 2021-04-14 NOTE — Progress Notes (Signed)
Physical Therapy Treatment Patient Details Name: Brandon [name] MRN: [id number] DOB: 1946-11-04 Today's Date: 04/14/2021    History of Present Illness Brandon [name] is a 75 y.o. male who presented with increasing shortness of breath and cough.  PMH: COPD, 4.5-5 L baseline, chronic diastolic CHF, PVD, IDDM, history of noncompliant with home medications    PT Comments    Pt received in recliner, SpO2 96% on 2L. He required min guard assist transfers and ambulation 175' with RW. Initiated amb on 2L with desat to 84% after 125'. O2 increased to 3L with SpO2 improvement to 87%. 2/4 DOE. After 1-minuted seated rest break on 3L, recovered to 99% and returned to 2L.  SATURATION QUALIFICATIONS: (This note is used to comply with regulatory documentation for home oxygen)  Patient Saturations on 2L at Rest = 96%  Patient Saturations on 2L while Ambulating = 84%  Patient Saturations on 3 Liters of oxygen while Ambulating = 87%  Please briefly explain why patient needs home oxygen: Pt on home O2 at baseline, 4.5 to 5L. Supplemental O2 required to maintain adequate SpO2 at rest and during activity.    Follow Up Recommendations  Home health PT     Equipment Recommendations  None recommended by PT    Recommendations for Other Services       Precautions / Restrictions Precautions Precautions: Fall;Other (comment) Precaution Comments: watch O2,  4.5LO2 at baseline    Mobility  Bed Mobility               General bed mobility comments: pt sitting in recliner upon arrival    Transfers Overall transfer level: Needs assistance Equipment used: Rolling walker (2 wheeled)   Sit to Stand: Min guard Stand pivot transfers: Min guard       General transfer comment: min guard for safety  Ambulation/Gait Ambulation/Gait assistance: Min guard Gait Distance (Feet): 175 Feet Assistive device: Rolling walker (2 wheeled) Gait Pattern/deviations: Step-through pattern;Decreased stride  length;Trunk flexed Gait velocity: decreased Gait velocity interpretation: <1.31 ft/sec, indicative of household ambulator General Gait Details: min guard assist for safety. Pt able to maintain SpO2 87% or higher on 3L. 96% at rest on 2L.   Stairs             Wheelchair Mobility    Modified Rankin (Stroke Patients Only)       Balance Overall balance assessment: Needs assistance Sitting-balance support: Feet supported;No upper extremity supported Sitting balance-Leahy Scale: Good     Standing balance support: No upper extremity supported;Bilateral upper extremity supported;During functional activity Standing balance-Leahy Scale: Fair Standing balance comment: static stand without UE support, RW for amb                            Cognition Arousal/Alertness: Awake/alert Behavior During Therapy: WFL for tasks assessed/performed Overall Cognitive Status: Within Functional Limits for tasks assessed                                 General Comments: A&Ox4, irritable about frequent readmissions      Exercises      General Comments General comments (skin integrity, edema, etc.): SpO2 96% on 2L. Initiated amb on 2L with desat to 84% after 125' requiring O2 increase to 3L for 87% SpO2. Recovered to 99% on 3L after 1-minute seated rest. Returned to 2L.      Pertinent Vitals/Pain Pain Assessment: No/denies pain  Home Living                      Prior Function            PT Goals (current goals can now be found in the care plan section) Acute Rehab PT Goals Patient Stated Goal: home Progress towards PT goals: Progressing toward goals    Frequency    Min 3X/week      PT Plan Current plan remains appropriate    Co-evaluation              AM-PAC PT "6 Clicks" Mobility   Outcome Measure  Help needed turning from your back to your side while in a flat bed without using bedrails?: None Help needed moving from lying on  your back to sitting on the side of a flat bed without using bedrails?: None Help needed moving to and from a bed to a chair (including a wheelchair)?: A Little Help needed standing up from a chair using your arms (e.g., wheelchair or bedside chair)?: A Little Help needed to walk in hospital room?: A Little Help needed climbing 3-5 steps with a railing? : A Little 6 Click Score: 20    End of Session Equipment Utilized During Treatment: Oxygen;Gait belt Activity Tolerance: Patient tolerated treatment well Patient left: in chair;with call bell/phone within reach Nurse Communication: Mobility status PT Visit Diagnosis: Unsteadiness on feet (R26.81);Difficulty in walking, not elsewhere classified (R26.2)     Time: 2863-8177 PT Time Calculation (min) (ACUTE ONLY): 15 min  Charges:  $Gait Training: 8-22 mins                     Brandon [name], PT  Office # [phone] Pager [phone]    Brandon [name] 04/14/2021, 11:15 AM

## 2021-04-14 NOTE — Discharge Instructions (Signed)

## 2021-05-02 ENCOUNTER — Emergency Department (HOSPITAL_COMMUNITY): Payer: No Typology Code available for payment source

## 2021-05-02 ENCOUNTER — Inpatient Hospital Stay (HOSPITAL_COMMUNITY): Payer: No Typology Code available for payment source

## 2021-05-02 ENCOUNTER — Other Ambulatory Visit: Payer: Self-pay

## 2021-05-02 ENCOUNTER — Inpatient Hospital Stay (HOSPITAL_COMMUNITY)
Admission: EM | Admit: 2021-05-02 | Discharge: 2021-05-04 | DRG: 061 | Disposition: A | Discharge status: Hospice | Payer: No Typology Code available for payment source | Attending: Neurology | Admitting: Neurology

## 2021-05-02 ENCOUNTER — Encounter (HOSPITAL_COMMUNITY): Payer: Self-pay | Admitting: Neurology

## 2021-05-02 DIAGNOSIS — E538 Deficiency of other specified B group vitamins: Secondary | ICD-10-CM | POA: Diagnosis present

## 2021-05-02 DIAGNOSIS — I714 Abdominal aortic aneurysm, without rupture: Secondary | ICD-10-CM | POA: Diagnosis present

## 2021-05-02 DIAGNOSIS — Z9282 Status post administration of tPA (rtPA) in a different facility within the last 24 hours prior to admission to current facility: Secondary | ICD-10-CM | POA: Diagnosis not present

## 2021-05-02 DIAGNOSIS — I5032 Chronic diastolic (congestive) heart failure: Secondary | ICD-10-CM | POA: Diagnosis present

## 2021-05-02 DIAGNOSIS — I480 Paroxysmal atrial fibrillation: Secondary | ICD-10-CM | POA: Diagnosis not present

## 2021-05-02 DIAGNOSIS — G8194 Hemiplegia, unspecified affecting left nondominant side: Secondary | ICD-10-CM | POA: Diagnosis present

## 2021-05-02 DIAGNOSIS — I4891 Unspecified atrial fibrillation: Secondary | ICD-10-CM | POA: Diagnosis not present

## 2021-05-02 DIAGNOSIS — Z6822 Body mass index (BMI) 22.0-22.9, adult: Secondary | ICD-10-CM | POA: Diagnosis not present

## 2021-05-02 DIAGNOSIS — Z9981 Dependence on supplemental oxygen: Secondary | ICD-10-CM

## 2021-05-02 DIAGNOSIS — J9621 Acute and chronic respiratory failure with hypoxia: Secondary | ICD-10-CM | POA: Diagnosis not present

## 2021-05-02 DIAGNOSIS — Z7982 Long term (current) use of aspirin: Secondary | ICD-10-CM

## 2021-05-02 DIAGNOSIS — Z720 Tobacco use: Secondary | ICD-10-CM | POA: Diagnosis present

## 2021-05-02 DIAGNOSIS — F1020 Alcohol dependence, uncomplicated: Secondary | ICD-10-CM | POA: Diagnosis present

## 2021-05-02 DIAGNOSIS — I739 Peripheral vascular disease, unspecified: Secondary | ICD-10-CM | POA: Diagnosis not present

## 2021-05-02 DIAGNOSIS — J9620 Acute and chronic respiratory failure, unspecified whether with hypoxia or hypercapnia: Secondary | ICD-10-CM | POA: Diagnosis not present

## 2021-05-02 DIAGNOSIS — K219 Gastro-esophageal reflux disease without esophagitis: Secondary | ICD-10-CM | POA: Diagnosis present

## 2021-05-02 DIAGNOSIS — Z515 Encounter for palliative care: Secondary | ICD-10-CM | POA: Diagnosis not present

## 2021-05-02 DIAGNOSIS — R29810 Facial weakness: Secondary | ICD-10-CM | POA: Diagnosis present

## 2021-05-02 DIAGNOSIS — D631 Anemia in chronic kidney disease: Secondary | ICD-10-CM | POA: Diagnosis present

## 2021-05-02 DIAGNOSIS — E1122 Type 2 diabetes mellitus with diabetic chronic kidney disease: Secondary | ICD-10-CM | POA: Diagnosis present

## 2021-05-02 DIAGNOSIS — Z955 Presence of coronary angioplasty implant and graft: Secondary | ICD-10-CM

## 2021-05-02 DIAGNOSIS — K861 Other chronic pancreatitis: Secondary | ICD-10-CM | POA: Diagnosis present

## 2021-05-02 DIAGNOSIS — Z794 Long term (current) use of insulin: Secondary | ICD-10-CM

## 2021-05-02 DIAGNOSIS — I130 Hypertensive heart and chronic kidney disease with heart failure and stage 1 through stage 4 chronic kidney disease, or unspecified chronic kidney disease: Secondary | ICD-10-CM | POA: Diagnosis present

## 2021-05-02 DIAGNOSIS — I5033 Acute on chronic diastolic (congestive) heart failure: Secondary | ICD-10-CM | POA: Diagnosis not present

## 2021-05-02 DIAGNOSIS — I639 Cerebral infarction, unspecified: Secondary | ICD-10-CM | POA: Diagnosis present

## 2021-05-02 DIAGNOSIS — C61 Malignant neoplasm of prostate: Secondary | ICD-10-CM | POA: Diagnosis not present

## 2021-05-02 DIAGNOSIS — Z66 Do not resuscitate: Secondary | ICD-10-CM | POA: Diagnosis present

## 2021-05-02 DIAGNOSIS — R414 Neurologic neglect syndrome: Secondary | ICD-10-CM | POA: Diagnosis present

## 2021-05-02 DIAGNOSIS — Z8673 Personal history of transient ischemic attack (TIA), and cerebral infarction without residual deficits: Secondary | ICD-10-CM

## 2021-05-02 DIAGNOSIS — J9611 Chronic respiratory failure with hypoxia: Secondary | ICD-10-CM | POA: Diagnosis not present

## 2021-05-02 DIAGNOSIS — E440 Moderate protein-calorie malnutrition: Secondary | ICD-10-CM | POA: Diagnosis present

## 2021-05-02 DIAGNOSIS — I63411 Cerebral infarction due to embolism of right middle cerebral artery: Secondary | ICD-10-CM | POA: Diagnosis present

## 2021-05-02 DIAGNOSIS — Z7984 Long term (current) use of oral hypoglycemic drugs: Secondary | ICD-10-CM

## 2021-05-02 DIAGNOSIS — J449 Chronic obstructive pulmonary disease, unspecified: Secondary | ICD-10-CM | POA: Diagnosis present

## 2021-05-02 DIAGNOSIS — I252 Old myocardial infarction: Secondary | ICD-10-CM

## 2021-05-02 DIAGNOSIS — Z8249 Family history of ischemic heart disease and other diseases of the circulatory system: Secondary | ICD-10-CM

## 2021-05-02 DIAGNOSIS — E785 Hyperlipidemia, unspecified: Secondary | ICD-10-CM | POA: Diagnosis not present

## 2021-05-02 DIAGNOSIS — Z7189 Other specified counseling: Secondary | ICD-10-CM | POA: Diagnosis not present

## 2021-05-02 DIAGNOSIS — G2581 Restless legs syndrome: Secondary | ICD-10-CM | POA: Diagnosis present

## 2021-05-02 DIAGNOSIS — N1831 Chronic kidney disease, stage 3a: Secondary | ICD-10-CM | POA: Diagnosis present

## 2021-05-02 DIAGNOSIS — Z888 Allergy status to other drugs, medicaments and biological substances status: Secondary | ICD-10-CM

## 2021-05-02 DIAGNOSIS — E11649 Type 2 diabetes mellitus with hypoglycemia without coma: Secondary | ICD-10-CM | POA: Diagnosis not present

## 2021-05-02 DIAGNOSIS — E269 Hyperaldosteronism, unspecified: Secondary | ICD-10-CM | POA: Diagnosis present

## 2021-05-02 DIAGNOSIS — Z79899 Other long term (current) drug therapy: Secondary | ICD-10-CM

## 2021-05-02 DIAGNOSIS — E119 Type 2 diabetes mellitus without complications: Secondary | ICD-10-CM

## 2021-05-02 DIAGNOSIS — J439 Emphysema, unspecified: Secondary | ICD-10-CM | POA: Diagnosis present

## 2021-05-02 DIAGNOSIS — I10 Essential (primary) hypertension: Secondary | ICD-10-CM | POA: Diagnosis present

## 2021-05-02 DIAGNOSIS — R29713 NIHSS score 13: Secondary | ICD-10-CM | POA: Diagnosis present

## 2021-05-02 DIAGNOSIS — J9690 Respiratory failure, unspecified, unspecified whether with hypoxia or hypercapnia: Secondary | ICD-10-CM

## 2021-05-02 DIAGNOSIS — E1159 Type 2 diabetes mellitus with other circulatory complications: Secondary | ICD-10-CM | POA: Diagnosis not present

## 2021-05-02 DIAGNOSIS — Z87891 Personal history of nicotine dependence: Secondary | ICD-10-CM

## 2021-05-02 DIAGNOSIS — Z20822 Contact with and (suspected) exposure to covid-19: Secondary | ICD-10-CM | POA: Diagnosis present

## 2021-05-02 DIAGNOSIS — E1165 Type 2 diabetes mellitus with hyperglycemia: Secondary | ICD-10-CM | POA: Diagnosis present

## 2021-05-02 DIAGNOSIS — I5030 Unspecified diastolic (congestive) heart failure: Secondary | ICD-10-CM | POA: Diagnosis present

## 2021-05-02 DIAGNOSIS — Z9119 Patient's noncompliance with other medical treatment and regimen: Secondary | ICD-10-CM

## 2021-05-02 DIAGNOSIS — R471 Dysarthria and anarthria: Secondary | ICD-10-CM | POA: Diagnosis present

## 2021-05-02 DIAGNOSIS — E1151 Type 2 diabetes mellitus with diabetic peripheral angiopathy without gangrene: Secondary | ICD-10-CM | POA: Diagnosis present

## 2021-05-02 DIAGNOSIS — I2510 Atherosclerotic heart disease of native coronary artery without angina pectoris: Secondary | ICD-10-CM | POA: Diagnosis present

## 2021-05-02 DIAGNOSIS — Z7902 Long term (current) use of antithrombotics/antiplatelets: Secondary | ICD-10-CM

## 2021-05-02 LAB — DIFFERENTIAL
Abs Immature Granulocytes: 0.04 10*3/uL (ref 0.00–0.07)
Basophils Absolute: 0 10*3/uL (ref 0.0–0.1)
Basophils Relative: 0 %
Eosinophils Absolute: 0 10*3/uL (ref 0.0–0.5)
Eosinophils Relative: 0 %
Immature Granulocytes: 0 %
Lymphocytes Relative: 11 %
Lymphs Abs: 1.2 10*3/uL (ref 0.7–4.0)
Monocytes Absolute: 0.8 10*3/uL (ref 0.1–1.0)
Monocytes Relative: 8 %
Neutro Abs: 8.3 10*3/uL — ABNORMAL HIGH (ref 1.7–7.7)
Neutrophils Relative %: 81 %

## 2021-05-02 LAB — CBC
HCT: 34.8 % — ABNORMAL LOW (ref 39.0–52.0)
Hemoglobin: 10.5 g/dL — ABNORMAL LOW (ref 13.0–17.0)
MCH: 25.9 pg — ABNORMAL LOW (ref 26.0–34.0)
MCHC: 30.2 g/dL (ref 30.0–36.0)
MCV: 85.7 fL (ref 80.0–100.0)
Platelets: 343 10*3/uL (ref 150–400)
RBC: 4.06 MIL/uL — ABNORMAL LOW (ref 4.22–5.81)
RDW: 20.6 % — ABNORMAL HIGH (ref 11.5–15.5)
WBC: 10.3 10*3/uL (ref 4.0–10.5)
nRBC: 1.8 % — ABNORMAL HIGH (ref 0.0–0.2)

## 2021-05-02 LAB — COMPREHENSIVE METABOLIC PANEL
ALT: 17 U/L (ref 0–44)
AST: 20 U/L (ref 15–41)
Albumin: 3.2 g/dL — ABNORMAL LOW (ref 3.5–5.0)
Alkaline Phosphatase: 61 U/L (ref 38–126)
Anion gap: 13 (ref 5–15)
BUN: 29 mg/dL — ABNORMAL HIGH (ref 8–23)
CO2: 23 mmol/L (ref 22–32)
Calcium: 8.6 mg/dL — ABNORMAL LOW (ref 8.9–10.3)
Chloride: 99 mmol/L (ref 98–111)
Creatinine, Ser: 1.49 mg/dL — ABNORMAL HIGH (ref 0.61–1.24)
GFR, Estimated: 49 mL/min — ABNORMAL LOW (ref >60)
Glucose, Bld: 201 mg/dL — ABNORMAL HIGH (ref 70–99)
Potassium: 3.8 mmol/L (ref 3.5–5.1)
Sodium: 135 mmol/L (ref 135–145)
Total Bilirubin: 0.8 mg/dL (ref 0.3–1.2)
Total Protein: 6.1 g/dL — ABNORMAL LOW (ref 6.5–8.1)

## 2021-05-02 LAB — I-STAT CHEM 8, ED
BUN: 27 mg/dL — ABNORMAL HIGH (ref 8–23)
Calcium, Ion: 1.04 mmol/L — ABNORMAL LOW (ref 1.15–1.40)
Chloride: 99 mmol/L (ref 98–111)
Creatinine, Ser: 1.4 mg/dL — ABNORMAL HIGH (ref 0.61–1.24)
Glucose, Bld: 198 mg/dL — ABNORMAL HIGH (ref 70–99)
HCT: 34 % — ABNORMAL LOW (ref 39.0–52.0)
Hemoglobin: 11.6 g/dL — ABNORMAL LOW (ref 13.0–17.0)
Potassium: 3.7 mmol/L (ref 3.5–5.1)
Sodium: 135 mmol/L (ref 135–145)
TCO2: 23 mmol/L (ref 22–32)

## 2021-05-02 LAB — RESP PANEL BY RT-PCR (FLU A&B, COVID) ARPGX2
Influenza A by PCR: NEGATIVE
Influenza B by PCR: NEGATIVE
SARS Coronavirus 2 by RT PCR: NEGATIVE

## 2021-05-02 LAB — GLUCOSE, CAPILLARY: Glucose-Capillary: 107 mg/dL — ABNORMAL HIGH (ref 70–99)

## 2021-05-02 LAB — APTT: aPTT: 29 seconds (ref 24–36)

## 2021-05-02 LAB — CBG MONITORING, ED: Glucose-Capillary: 185 mg/dL — ABNORMAL HIGH (ref 70–99)

## 2021-05-02 LAB — PROTIME-INR
INR: 1.1 (ref 0.8–1.2)
Prothrombin Time: 14.1 seconds (ref 11.4–15.2)

## 2021-05-02 MED ORDER — SENNOSIDES-DOCUSATE SODIUM 8.6-50 MG PO TABS: 1.0000 | ORAL_TABLET | Freq: Every evening | ORAL | Status: DC | PRN

## 2021-05-02 MED ORDER — PANTOPRAZOLE SODIUM 40 MG IV SOLR
40.0000 mg | Freq: Every day | INTRAVENOUS | Status: DC
  Administered 2021-05-02: 40 mg via INTRAVENOUS
  Filled 2021-05-02: qty 40

## 2021-05-02 MED ORDER — ALTEPLASE (STROKE) FULL DOSE INFUSION
0.9000 mg/kg | Freq: Once | INTRAVENOUS | Status: AC
  Administered 2021-05-02: 68.9 mg via INTRAVENOUS
  Filled 2021-05-02: qty 100

## 2021-05-02 MED ORDER — STROKE: EARLY STAGES OF RECOVERY BOOK: Freq: Once | Status: DC

## 2021-05-02 MED ORDER — SODIUM CHLORIDE 0.9% FLUSH: 3.0000 mL | Freq: Once | INTRAVENOUS | Status: DC

## 2021-05-02 MED ORDER — DEXTROSE 50 % IV SOLN
12.5000 g | INTRAVENOUS | Status: AC
  Administered 2021-05-03: 12.5 g via INTRAVENOUS

## 2021-05-02 MED ORDER — FUROSEMIDE 20 MG PO TABS: 40.0000 mg | ORAL_TABLET | Freq: Two times a day (BID) | ORAL | Status: DC

## 2021-05-02 MED ORDER — SODIUM CHLORIDE 0.9 % IV SOLN
50.0000 mL | Freq: Once | INTRAVENOUS | Status: AC
  Administered 2021-05-02 (×2): 50 mL via INTRAVENOUS

## 2021-05-02 MED ORDER — MAGNESIUM OXIDE -MG SUPPLEMENT 400 (240 MG) MG PO TABS: 400.0000 mg | ORAL_TABLET | Freq: Every day | ORAL | Status: DC

## 2021-05-02 MED ORDER — ACETAMINOPHEN 160 MG/5ML PO SOLN: 650.0000 mg | ORAL | Status: DC | PRN

## 2021-05-02 MED ORDER — ACETAMINOPHEN 650 MG RE SUPP: 650.0000 mg | RECTAL | Status: DC | PRN

## 2021-05-02 MED ORDER — IOHEXOL 350 MG/ML SOLN
75.0000 mL | Freq: Once | INTRAVENOUS | Status: AC | PRN
  Administered 2021-05-02: 75 mL via INTRAVENOUS

## 2021-05-02 MED ORDER — ALBUTEROL SULFATE (2.5 MG/3ML) 0.083% IN NEBU: 2.5000 mg | INHALATION_SOLUTION | RESPIRATORY_TRACT | Status: DC | PRN

## 2021-05-02 MED ORDER — CLEVIDIPINE BUTYRATE 0.5 MG/ML IV EMUL: 0.0000 mg/h | INTRAVENOUS | Status: DC

## 2021-05-02 MED ORDER — ATORVASTATIN CALCIUM 40 MG PO TABS
40.0000 mg | ORAL_TABLET | Freq: Every day | ORAL | Status: DC
  Filled 2021-05-02: qty 1

## 2021-05-02 MED ORDER — DILTIAZEM HCL ER COATED BEADS 240 MG PO CP24: 240.0000 mg | ORAL_CAPSULE | Freq: Every day | ORAL | Status: DC

## 2021-05-02 MED ORDER — INSULIN ASPART 100 UNIT/ML IJ SOLN: 0.0000 [IU] | INTRAMUSCULAR | Status: DC

## 2021-05-02 MED ORDER — SODIUM CHLORIDE 0.9 % IV SOLN: INTRAVENOUS | Status: DC

## 2021-05-02 MED ORDER — BISOPROLOL FUMARATE 5 MG PO TABS: 5.0000 mg | ORAL_TABLET | Freq: Every day | ORAL | Status: DC

## 2021-05-02 MED ORDER — MOMETASONE FURO-FORMOTEROL FUM 200-5 MCG/ACT IN AERO
2.0000 | INHALATION_SPRAY | Freq: Two times a day (BID) | RESPIRATORY_TRACT | Status: DC
  Administered 2021-05-03: 2 via RESPIRATORY_TRACT
  Filled 2021-05-02: qty 8.8

## 2021-05-02 MED ORDER — METOPROLOL TARTRATE 5 MG/5ML IV SOLN
2.5000 mg | Freq: Four times a day (QID) | INTRAVENOUS | Status: DC
  Administered 2021-05-02 – 2021-05-03 (×3): 2.5 mg via INTRAVENOUS
  Filled 2021-05-02 (×2): qty 5

## 2021-05-02 MED ORDER — ACETAMINOPHEN 325 MG PO TABS: 650.0000 mg | ORAL_TABLET | ORAL | Status: DC | PRN

## 2021-05-02 NOTE — Consult Note (Signed)
NAME:  Brandon [name], MRN:  [id number], DOB:  May 31, 1946, LOS: 0 ADMISSION DATE:  05/02/2021, CONSULTATION DATE:  5/3 REFERRING MD: Cheral Marker , CHIEF COMPLAINT:  Acute stroke   History of Present Illness:  This is a 5 yom w/ hx as per below. Was actually to be admitted to hospice as of 5/3 w/ slow and progressive respiratory failure and functional decline. Then on afternoon of 5/3 at 1530 developed sudden onset of slurred speech/dysarthria and left sided weakness. EMS called Code stroke initiated. CT head neg for bleed. Had new AF w/ RVR. Was given IV TPA and this was completed at [facility]. PCCM asked to eval for assistance with medical therapy.   ->he is DNR   Pertinent  Medical History  Chronic hypoxic respiratory failure 2/2 COPD (2-4 lpm at home), was actually to enter into hospice via the [place] system 5/3. CHF PVD Venous insuff.  IDDM Medical non-compliance  Prostate cancer  PAF GERD  Significant Hospital Events: Including procedures, antibiotic start and stop dates in addition to other pertinent events   . Acute CVA w/ left sided weakness and dysarthria. CT angiogram w/ concern for occlusion of small right M2/M3 branchs. Got IV TPA which completed at 1758. PCCm asked to assist w/ medical care. DNR status confirmed    Interim History / Subjective:  Slowly improving neurologically. Now having increased left arm and left LE strength. Speech a little more clear   Objective   Blood pressure 131/77, pulse (Abnormal) 108, temperature 98 F (36.7 C), resp. rate 13, height 6' (1.829 m), weight 76.6 kg, SpO2 100 %.        Intake/Output Summary (Last 24 hours) at 05/02/2021 1827 Last data filed at 05/02/2021 1758 Gross per 24 hour  Intake 68.9 ml  Output no documentation  Net 68.9 ml   Filed Weights   05/02/21 1600 05/02/21 1754  Weight: 76.6 kg 76.6 kg    Examination: General: chronically ill appearing 75 year old male resting in bed. He is not in acute distress currently  HENT: left  sided facial droop. Sclera not icteric. No JVD Lungs: some scattered rhonchi. A little decreased in bases. No wheeze. Currently on 100% NRB, no accessory use. sats 100% Card tachy irreg irreg. No murmur  abd soft not tender + bowel sounds Ext chronic LE edema pitting 4+. Weak pulses. Chronic venous stasis changes.  Neuro awake. Follows commands. Left sided weakness. This is improving. Oriented X3 dysarthria noted but clearing    Labs/imaging that I havepersonally reviewed  (right click and "Reselect all SmartList Selections" daily)  See below   Resolved Hospital Problem list     Assessment & Plan:   Acute right MCA stroke. S/p IV TPA completed at [facility] to stroke team Serial neuro checks  Initiate secondary stroke prevention in 24 hrs w/ dual platelet therapy if no bleeding after TPA.  SBP goal < 180 CT head tomorrow F/u neuro-diagnostic imaging per neuro DNR status NPO for now   Acute on Chronic hypoxic respiratory failure in setting of advanced COPD. It is not clear to me how acute this is as has been slowly declining over weeks w/ impression that he was at end-of-life per from a pulmonary stand-point.  Plan Supplemental oxygen for sats > 88% cxr to assess for aspiration  Scheduled BDs and ICS he is typically on Dulera   H/o PAF now AF w/ RVR Plan Will add low dose scheduled Lopressor Cont tele  Placing hold orders for low  BP  H/o Chronic diastolic HF, CAD, HL,  and PAD as well as chronic venous disease.  Plan  Tele  Holding his home asa, lipitor, bisoprolol, dilt. & lasix  Will re-assess when able to take POs.   DM type two, insulin dependent now hyperglycemic  Plan SSI  Glucose goal 140-180  CKD stage III Plan Renal dose  meds Keep euvolemic Am chem   Chronic anemia w/out evidence of bleeding Plan Trend cbc   Best practice (right click and "Reselect all SmartList Selections" daily)  Diet:  NPO Pain/Anxiety/Delirium protocol (if indicated):  No VAP protocol (if indicated): Not indicated DVT prophylaxis: SCD GI prophylaxis: N/A and PPI Glucose control:  SSI Yes Central venous access:  N/A Arterial line:  N/A Foley:  N/A Mobility:  OOB  PT consulted: N/A Last date of multidisciplinary goals of care discussion [per primary ] Code Status:  DNR Disposition: to ICU   Labs   CBC: Recent Labs  Lab 05/02/21 1637 05/02/21 1651  WBC 10.3  --   NEUTROABS 8.3*  --   HGB 10.5* 11.6*  HCT 34.8* 34.0*  MCV 85.7  --   PLT 343  --     Basic Metabolic Panel: Recent Labs  Lab 05/02/21 1637 05/02/21 1651  NA 135 135  K 3.8 3.7  CL 99 99  CO2 23  --   GLUCOSE 201* 198*  BUN 29* 27*  CREATININE 1.49* 1.40*  CALCIUM 8.6*  --    GFR: Estimated Creatinine Clearance: 49.4 mL/min (A) (by C-G formula based on SCr of 1.4 mg/dL (H)). Recent Labs  Lab 05/02/21 1637  WBC 10.3    Liver Function Tests: Recent Labs  Lab 05/02/21 1637  AST 20  ALT 17  ALKPHOS 61  BILITOT 0.8  PROT 6.1*  ALBUMIN 3.2*   No results for input(s): LIPASE, AMYLASE in the last 168 hours. No results for input(s): AMMONIA in the last 168 hours.  ABG    Component Value Date/Time   PHART 7.443 04/12/2021 0437   PCO2ART 40.1 04/12/2021 0437   PO2ART 59.1 (L) 04/12/2021 0437   HCO3 27.1 04/12/2021 0437   TCO2 23 05/02/2021 1651   ACIDBASEDEF 2.1 (H) 02/19/2021 1926   O2SAT 88.6 04/12/2021 0437     Coagulation Profile: Recent Labs  Lab 05/02/21 1637  INR 1.1    Cardiac Enzymes: No results for input(s): CKTOTAL, CKMB, CKMBINDEX, TROPONINI in the last 168 hours.  HbA1C: Hgb A1c MFr Bld  Date/Time Value Ref Range Status  03/15/2021 02:58 AM 6.9 (H) 4.8 - 5.6 % Final    Comment:    (NOTE) Pre diabetes:          5.7%-6.4%  Diabetes:              >6.4%  Glycemic control for   <7.0% adults with diabetes   01/07/2021 08:06 PM 6.4 (H) 4.8 - 5.6 % Final    Comment:    (NOTE) Pre diabetes:          5.7%-6.4%  Diabetes:               >6.4%  Glycemic control for   <7.0% adults with diabetes     CBG: Recent Labs  Lab 05/02/21 1654  GLUCAP 185*    Review of Systems:   Not able  Past Medical History:  He,  has a past medical history of Abdominal aortic aneurysm without rupture ([place]), CAD (coronary artery disease), Chronic alcoholism ([place]), Chronic idiopathic constipation,  Chronic pancreatitis ([place]), Chronic respiratory failure with hypoxia ([place]), Essential hypertension, GERD (gastroesophageal reflux disease), Hyperaldosteronism ([place]), Malignant neoplasm of prostate ([place]), Nicotine dependence, Paroxysmal atrial tachycardia ([place]), Peripheral vascular disease ([place]), Serrated polyp of colon, and Type 2 diabetes mellitus ([place]).   Surgical History:   Past Surgical History:  Procedure Laterality Date  . CORONARY ATHERECTOMY N/A 01/11/2021   Procedure: CORONARY ATHERECTOMY;  Surgeon: Sherren Mocha, MD;  Location: [facility];  Service: Cardiovascular;  Laterality: N/A;  . CORONARY ATHERECTOMY N/A 01/13/2021   Procedure: CORONARY ATHERECTOMY;  Surgeon: Martinique, Dorien Mayotte M, MD;  Location: [facility];  Service: Cardiovascular;  Laterality: N/A;  . CORONARY STENT INTERVENTION N/A 01/13/2021   Procedure: CORONARY STENT INTERVENTION;  Surgeon: Martinique, Cameo Schmiesing M, MD;  Location: [facility];  Service: Cardiovascular;  Laterality: N/A;  . INTRAVASCULAR ULTRASOUND/IVUS N/A 01/13/2021   Procedure: Intravascular Ultrasound/IVUS;  Surgeon: Martinique, Eltha Tingley M, MD;  Location: [facility];  Service: Cardiovascular;  Laterality: N/A;  . PROSTATECTOMY  12/2012  . RIGHT/LEFT HEART CATH AND CORONARY ANGIOGRAPHY N/A 01/11/2021   Procedure: RIGHT/LEFT HEART CATH AND CORONARY ANGIOGRAPHY;  Surgeon: Sherren Mocha, MD;  Location: [facility];  Service: Cardiovascular;  Laterality: N/A;     Social History:   reports that he quit smoking about 6 months ago. His smoking use included cigarettes. He has a 30.00 pack-year  smoking history. He has never used smokeless tobacco. He reports previous alcohol use. He reports that he does not use drugs.   Family History:  His family history includes Hypertension in his father and mother.   Allergies Allergies  Allergen Reactions  . Hydrochlorothiazide Other (See Comments)    Hypokalemia (Pt claims not allergic 04/09/21)     Home Medications  Prior to Admission medications   Medication Sig Start Date End Date Taking? Authorizing Provider  albuterol (VENTOLIN HFA) 108 (90 Base) MCG/ACT inhaler Inhale 2 puffs into the lungs every 4 (four) hours as needed for wheezing or shortness of breath. 11/24/19   [provider]  aspirin 81 MG EC tablet Take 1 tablet (81 mg total) by mouth daily. 10/21/20   Little Ishikawa, MD  atorvastatin (LIPITOR) 40 MG tablet Take 40 mg by mouth at bedtime.  06/11/19   [provider]  bisoprolol (ZEBETA) 5 MG tablet Take 1 tablet (5 mg total) by mouth daily. 01/14/21   Raiford Noble Latif, DO  clopidogrel (PLAVIX) 75 MG tablet Take 1 tablet (75 mg total) by mouth daily. 10/22/20   Little Ishikawa, MD  diltiazem (CARDIZEM CD) 240 MG 24 hr capsule Take 1 capsule (240 mg total) by mouth daily. 01/14/21   Raiford Noble Latif, DO  empagliflozin (JARDIANCE) 10 MG TABS tablet Take 1 tablet (10 mg total) by mouth daily before breakfast. 03/10/21   Geradine Girt, DO  empagliflozin (JARDIANCE) 10 MG TABS tablet TAKE 1 TABLET (10 MG TOTAL) BY MOUTH DAILY. Patient not taking: No sig reported 02/24/21 02/24/22  Geradine Girt, DO  feeding supplement, GLUCERNA SHAKE, (GLUCERNA SHAKE) LIQD Take 237 mLs by mouth 2 (two) times daily between meals. 01/14/21   Raiford Noble Latif, DO  Fluticasone-Salmeterol (ADVAIR) 250-50 MCG/DOSE AEPB Inhale 1 puff into the lungs 2 (two) times daily.    [provider]  furosemide (LASIX) 40 MG tablet Take 1 tablet (40 mg total) by mouth 2 (two) times daily. 02/24/21   Geradine Girt, DO   furosemide (LASIX) 40 MG tablet TAKE  1 TABLET (40 MG TOTAL) BY MOUTH TWO TIMES DAILY. Patient not taking: No sig reported 02/24/21 02/24/22  Eulogio Bear U, DO  insulin aspart protamine - aspart (NOVOLOG 70/30 MIX) (70-30) 100 UNIT/ML FlexPen Inject 0.2 mLs (20 Units total) into the skin 2 (two) times daily with a meal. 01/14/21   Sheikh, Omair Latif, DO  Magnesium Oxide 420 MG TABS Take 420 mg by mouth daily.    [provider]  metFORMIN (GLUCOPHAGE) 1000 MG tablet Take by mouth 2 (two) times daily with a meal.    [provider]  Multiple Vitamin (MULTIVITAMIN WITH MINERALS) TABS tablet Take 1 tablet by mouth daily. 01/14/21   Raiford Noble Latif, DO  nortriptyline (PAMELOR) 50 MG capsule Take 50 mg by mouth at bedtime. For restless leg symdrome    [provider]  Omega-3 1000 MG CAPS Take 1,000 mg by mouth in the morning and at bedtime.     [provider]  OXYGEN Inhale 4.5 L/min into the lungs continuous.    [provider]  pantoprazole (PROTONIX) 40 MG tablet Take 1 tablet (40 mg total) by mouth daily. Patient taking differently: Take 40 mg by mouth daily before breakfast. 01/14/21   Raiford Noble Latif, DO  polyethylene glycol (MIRALAX / GLYCOLAX) 17 g packet Take 17 g by mouth daily as needed (constipation). Mix with water or other liquid as instructed before drinking    [provider]  potassium chloride (KLOR-CON) 10 MEQ tablet Take 2 tablets (20 mEq total) by mouth daily. Patient taking differently: Take 10 mEq by mouth daily. 02/24/21   Geradine Girt, DO  potassium chloride (KLOR-CON) 10 MEQ tablet TAKE 2 TABLETS (20 MEQ TOTAL) BY MOUTH DAILY. Patient not taking: No sig reported 02/24/21 02/24/22  Geradine Girt, DO  senna-docusate (SENOKOT-S) 8.6-50 MG tablet Take 1 tablet by mouth at bedtime as needed for mild constipation. Patient taking differently: Take 1 tablet by mouth daily at 6 (six) AM. 01/14/21   Raiford Noble Latif, DO   Tiotropium Bromide Monohydrate 2.5 MCG/ACT AERS Inhale 2 puffs into the lungs daily.    [provider]     Critical care time:  35 minutes this included 1/3 time chart review, 1/3 face to face and 1/3 dictating plan     Erick Colace ACNP-BC [facility] Pager # [phone] OR # [phone] if no answer

## 2021-05-02 NOTE — Code Documentation (Signed)
Pt is a 75 yr old with multiple medical problems who was last known well this afternoon at 1530 after which he became weak on the left side with left facial droop and slurred speech. His son-in-law Leda Min called EMS at that time. [facility] to [facility] at 1635. He was cleared at bridge by EDP, and taken to CT at 1640. CTNC negative for hemorrhage per Dr Cheral Marker. Dr Cheral Marker called pt's daughter Mardene Celeste, and discussed TPA with her. She did consent to give pt TPA. TPA (dose of 68.9 as a bolus of 6.9mg  followed by an infusion of 62 ml/hr) hung at 1657. See flowsheet for times, VS and NIHSS. CTA also obtained and was negative for LVO per neurologist. Pt taken to ED room 19 where he will continue his stroke workup. Bedside handoff with Raquel Sarna RN complete. Pt will need q 15 min VS and mNIHSS for 2 hrs, q 30 min for 6 hrs, then q hr.

## 2021-05-02 NOTE — ED Triage Notes (Signed)
Pt arrived to ED via EMS from home. Pt arrived as a CODE STROKE w/ LKW 1530. Pt was supposed to go to hospice today and pt's son in law was going to take him to hospice and pt wanted to use the bathroom. Pt went to use bathroom and stroke s/s were noted at 1530. EMS reports LUE and LLE weakness and L facial droop as well as slurred speech. EMS reports VSS, pt A-fib on the monitor. EMS placed 18g R AC. CBG 222

## 2021-05-02 NOTE — ED Provider Notes (Signed)
[facility] EMERGENCY DEPARTMENT Provider Note   CSN: [id number] Arrival date & time: 05/02/21  1634     History Chief Complaint  Patient presents with  . Code Stroke    Brandon [name] is a 75 y.o. male.  The history is provided by the patient.  Neurologic Problem This is a new problem. The current episode started less than 1 hour ago. The problem occurs constantly. The problem has not changed since onset.Pertinent negatives include no chest pain, no abdominal pain, no headaches and no shortness of breath. Nothing aggravates the symptoms. Nothing relieves the symptoms. He has tried nothing for the symptoms. The treatment provided no relief.       Past Medical History:  Diagnosis Date  . Abdominal aortic aneurysm without rupture ([place])   . CAD (coronary artery disease)    severe on chest CT   . Chronic alcoholism ([place])   . Chronic idiopathic constipation   . Chronic pancreatitis ([place])   . Chronic respiratory failure with hypoxia ([place])   . Essential hypertension   . GERD (gastroesophageal reflux disease)   . Hyperaldosteronism ([place])   . Malignant neoplasm of prostate ([place])   . Nicotine dependence   . Paroxysmal atrial tachycardia ([place])   . Peripheral vascular disease ([place])   . Serrated polyp of colon   . Type 2 diabetes mellitus [facility])     Patient Active Problem List   Diagnosis Date Noted  . 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 COPD (chronic obstructive pulmonary disease) ([place]) 04/09/2021  . Acute on chronic congestive heart failure ([place])   . Palliative care by specialist   . Advance care planning   . Prolonged QT interval 03/13/2021  . Protein-calorie malnutrition, severe 02/21/2021  . Acute on chronic respiratory failure with hypoxia ([place]) 02/19/2021  . Normocytic anemia 02/19/2021  . (HFpEF) heart failure with preserved ejection fraction ([place]) 02/01/2021  . Pressure injury of skin 01/09/2021  . Malnutrition of moderate degree 01/09/2021  .  Elevated troponin 01/07/2021  . COPD with acute exacerbation ([place]) 01/07/2021  . History of CVA (cerebrovascular accident) 01/07/2021  . COPD exacerbation ([place]) 01/07/2021  . Hypomagnesemia 01/07/2021  . Essential hypertension   . Type 2 diabetes mellitus ([place])   . CAD (coronary artery disease)   . GERD (gastroesophageal reflux disease)   . Acute on chronic respiratory failure with hypoxemia ([place])   . COPD without exacerbation ([place])   . Tobacco abuse   . Hyponatremia   . Hyperkalemia   . Ischemic stroke [facility])     Past Surgical History:  Procedure Laterality Date  . CORONARY ATHERECTOMY N/A 01/11/2021   Procedure: CORONARY ATHERECTOMY;  Surgeon: Sherren Mocha, MD;  Location: [facility] CV LAB;  Service: Cardiovascular;  Laterality: N/A;  . CORONARY ATHERECTOMY N/A 01/13/2021   Procedure: CORONARY ATHERECTOMY;  Surgeon: Martinique, Peter M, MD;  Location: [facility] CV LAB;  Service: Cardiovascular;  Laterality: N/A;  . CORONARY STENT INTERVENTION N/A 01/13/2021   Procedure: CORONARY STENT INTERVENTION;  Surgeon: Martinique, Peter M, MD;  Location: [facility] CV LAB;  Service: Cardiovascular;  Laterality: N/A;  . INTRAVASCULAR ULTRASOUND/IVUS N/A 01/13/2021   Procedure: Intravascular Ultrasound/IVUS;  Surgeon: Martinique, Peter M, MD;  Location: [facility] CV [facility];  Service: Cardiovascular;  Laterality: N/A;  . PROSTATECTOMY  12/2012  . RIGHT/LEFT HEART CATH AND CORONARY ANGIOGRAPHY N/A 01/11/2021   Procedure: RIGHT/LEFT HEART CATH AND CORONARY ANGIOGRAPHY;  Surgeon: Sherren Mocha, MD;  Location: [facility] CV LAB;  Service: Cardiovascular;  Laterality: N/A;       Family History  Problem Relation Age of Onset  . Hypertension Mother   . Hypertension Father     Social History   Tobacco Use  . Smoking status: Former Smoker    Packs/day: 0.50    Years: 60.00    Pack years: 30.00    Types: Cigarettes    Quit date: 10/08/2020    Years since quitting: 0.5  . Smokeless tobacco: Never  Used  Substance Use Topics  . Alcohol use: Not Currently  . Drug use: Never    Home Medications Prior to Admission medications   Medication Sig Start Date End Date Taking? Authorizing Provider  albuterol (VENTOLIN HFA) 108 (90 Base) MCG/ACT inhaler Inhale 2 puffs into the lungs every 4 (four) hours as needed for wheezing or shortness of breath. 11/24/19   [provider]  aspirin 81 MG EC tablet Take 1 tablet (81 mg total) by mouth daily. 10/21/20   Little Ishikawa, MD  atorvastatin (LIPITOR) 40 MG tablet Take 40 mg by mouth at bedtime.  06/11/19   [provider]  bisoprolol (ZEBETA) 5 MG tablet Take 1 tablet (5 mg total) by mouth daily. 01/14/21   Raiford Noble Latif, DO  clopidogrel (PLAVIX) 75 MG tablet Take 1 tablet (75 mg total) by mouth daily. 10/22/20   Little Ishikawa, MD  diltiazem (CARDIZEM CD) 240 MG 24 hr capsule Take 1 capsule (240 mg total) by mouth daily. 01/14/21   Raiford Noble Latif, DO  empagliflozin (JARDIANCE) 10 MG TABS tablet Take 1 tablet (10 mg total) by mouth daily before breakfast. 03/10/21   Geradine Girt, DO  empagliflozin (JARDIANCE) 10 MG TABS tablet TAKE 1 TABLET (10 MG TOTAL) BY MOUTH DAILY. Patient not taking: No sig reported 02/24/21 02/24/22  Geradine Girt, DO  feeding supplement, GLUCERNA SHAKE, (GLUCERNA SHAKE) LIQD Take 237 mLs by mouth 2 (two) times daily between meals. 01/14/21   Raiford Noble Latif, DO  Fluticasone-Salmeterol (ADVAIR) 250-50 MCG/DOSE AEPB Inhale 1 puff into the lungs 2 (two) times daily.    [provider]  furosemide (LASIX) 40 MG tablet Take 1 tablet (40 mg total) by mouth 2 (two) times daily. 02/24/21   Geradine Girt, DO  furosemide (LASIX) 40 MG tablet TAKE 1 TABLET (40 MG TOTAL) BY MOUTH TWO TIMES DAILY. Patient not taking: No sig reported 02/24/21 02/24/22  Eulogio Bear U, DO  insulin aspart protamine - aspart (NOVOLOG 70/30 MIX) (70-30) 100 UNIT/ML FlexPen Inject 0.2 mLs (20 Units total) into  the skin 2 (two) times daily with a meal. 01/14/21   Sheikh, Omair Latif, DO  Magnesium Oxide 420 MG TABS Take 420 mg by mouth daily.    [provider]  metFORMIN (GLUCOPHAGE) 1000 MG tablet Take by mouth 2 (two) times daily with a meal.    [provider]  Multiple Vitamin (MULTIVITAMIN WITH MINERALS) TABS tablet Take 1 tablet by mouth daily. 01/14/21   Raiford Noble Latif, DO  nortriptyline (PAMELOR) 50 MG capsule Take 50 mg by mouth at bedtime. For restless leg symdrome    [provider]  Omega-3 1000 MG CAPS Take 1,000 mg by mouth in the morning and at bedtime.     [provider]  OXYGEN Inhale 4.5 L/min into the lungs continuous.    [provider]  pantoprazole (PROTONIX) 40 MG tablet Take 1 tablet (40 mg total) by mouth daily. Patient taking differently: Take 40 mg by mouth  daily before breakfast. 01/14/21   Raiford Noble Latif, DO  polyethylene glycol (MIRALAX / GLYCOLAX) 17 g packet Take 17 g by mouth daily as needed (constipation). Mix with water or other liquid as instructed before drinking    [provider]  potassium chloride (KLOR-CON) 10 MEQ tablet Take 2 tablets (20 mEq total) by mouth daily. Patient taking differently: Take 10 mEq by mouth daily. 02/24/21   Geradine Girt, DO  potassium chloride (KLOR-CON) 10 MEQ tablet TAKE 2 TABLETS (20 MEQ TOTAL) BY MOUTH DAILY. Patient not taking: No sig reported 02/24/21 02/24/22  Geradine Girt, DO  senna-docusate (SENOKOT-S) 8.6-50 MG tablet Take 1 tablet by mouth at bedtime as needed for mild constipation. Patient taking differently: Take 1 tablet by mouth daily at 6 (six) AM. 01/14/21   Raiford Noble Latif, DO  Tiotropium Bromide Monohydrate 2.5 MCG/ACT AERS Inhale 2 puffs into the lungs daily.    [provider]    Allergies    Hydrochlorothiazide  Review of Systems   Review of Systems  Constitutional: Negative for chills and fever.  HENT: Negative for ear pain and sore  throat.   Eyes: Negative for pain and visual disturbance.  Respiratory: Negative for cough and shortness of breath.   Cardiovascular: Negative for chest pain and palpitations.  Gastrointestinal: Negative for abdominal pain and vomiting.  Genitourinary: Negative for dysuria and hematuria.  Musculoskeletal: Negative for arthralgias and back pain.  Skin: Negative for color change and rash.  Neurological: Positive for facial asymmetry, speech difficulty and weakness. Negative for seizures, syncope and headaches.  All other systems reviewed and are negative.   Physical Exam Updated Vital Signs  ED Triage Vitals [05/02/21 1600]  Enc Vitals Group     BP      Pulse      Resp      Temp      Temp src      SpO2      Weight 168 lb 14 oz (76.6 kg)     Height      Head Circumference      Peak Flow      Pain Score      Pain Loc      Pain Edu?      Excl. in [place]?     Physical Exam Vitals and nursing note reviewed.  Constitutional:      General: He is not in acute distress.    Appearance: He is well-developed. He is ill-appearing.  HENT:     Head: Normocephalic and atraumatic.  Eyes:     Extraocular Movements: Extraocular movements intact.     Conjunctiva/sclera: Conjunctivae normal.     Pupils: Pupils are equal, round, and reactive to light.  Cardiovascular:     Rate and Rhythm: Tachycardia present. Rhythm irregular.     Heart sounds: No murmur heard.   Pulmonary:     Effort: Pulmonary effort is normal. No respiratory distress.     Breath sounds: Normal breath sounds.  Abdominal:     Palpations: Abdomen is soft.     Tenderness: There is no abdominal tenderness.  Musculoskeletal:     Cervical back: Neck supple.  Skin:    General: Skin is warm and dry.     Capillary Refill: Capillary refill takes less than 2 seconds.  Neurological:     Mental Status: He is alert.     Motor: Weakness present.     Comments: 0 out of 5 strength in the left upper  extremity, 1+ out of 5 strength  in the left lower extremity, left-sided neglect, right-sided preference, normal strength and sensation on the right side, slurred speech, left-sided facial droop     ED Results / Procedures / Treatments   Labs (all labs ordered are listed, but only abnormal results are displayed) Labs Reviewed  CBC - Abnormal; Notable for the following components:      Result Value   RBC 4.06 (*)    Hemoglobin 10.5 (*)    HCT 34.8 (*)    MCH 25.9 (*)    RDW 20.6 (*)    nRBC 1.8 (*)    All other components within normal limits  DIFFERENTIAL - Abnormal; Notable for the following components:   Neutro Abs 8.3 (*)    All other components within normal limits  COMPREHENSIVE METABOLIC PANEL - Abnormal; Notable for the following components:   Glucose, Bld 201 (*)    BUN 29 (*)    Creatinine, Ser 1.49 (*)    Calcium 8.6 (*)    Total Protein 6.1 (*)    Albumin 3.2 (*)    GFR, Estimated 49 (*)    All other components within normal limits  I-STAT CHEM 8, ED - Abnormal; Notable for the following components:   BUN 27 (*)    Creatinine, Ser 1.40 (*)    Glucose, Bld 198 (*)    Calcium, Ion 1.04 (*)    Hemoglobin 11.6 (*)    HCT 34.0 (*)    All other components within normal limits  CBG MONITORING, ED - Abnormal; Notable for the following components:   Glucose-Capillary 185 (*)    All other components within normal limits  RESP PANEL BY RT-PCR (FLU A&B, COVID) ARPGX2  PROTIME-INR  APTT  HEMOGLOBIN A1C  LIPID PANEL    EKG EKG Interpretation  Date/Time:  Tuesday May 02 2021 17:22:18 EDT Ventricular Rate:  99 PR Interval:  156 QRS Duration: 112 QT Interval:  385 QTC Calculation: 495 R Axis:   46 Text Interpretation: atrial fibrilation Borderline intraventricular conduction delay Borderline low voltage, extremity leads Borderline prolonged QT interval Confirmed by Lennice Sites [phone]) on 05/02/2021 5:28:43 PM   Radiology CT Angio Head W or Wo Contrast  Result Date: 05/02/2021 CLINICAL DATA:   Left-sided deficit.  Stroke. EXAM: CT ANGIOGRAPHY HEAD AND NECK TECHNIQUE: Multidetector CT imaging of the head and neck was performed using the standard protocol during bolus administration of intravenous contrast. Multiplanar CT image reconstructions and MIPs were obtained to evaluate the vascular anatomy. Carotid stenosis measurements (when applicable) are obtained utilizing NASCET criteria, using the distal internal carotid diameter as the denominator. CONTRAST:  25mL OMNIPAQUE IOHEXOL 350 MG/ML SOLN COMPARISON:  CT head 05/02/2021.  CT angio head and neck 10/13/2020 FINDINGS: CTA NECK FINDINGS Aortic arch: Atherosclerotic calcification aortic arch and proximal great vessels without significant stenosis. Left vertebral artery origin from the arch. Extensive atherosclerotic disease in the proximal great vessels. Right carotid system: Atherosclerotic calcification throughout the right carotid bifurcation without significant stenosis. Left carotid system: Extensive atherosclerotic calcification left carotid bifurcation without significant stenosis. Atherosclerotic calcification throughout the left common carotid without stenosis. Vertebral arteries: Right vertebral artery dominant. Multiple areas of moderate stenosis in the mid right vertebral artery due to atherosclerotic calcification. Left vertebral artery origin from the arch without significant stenosis throughout its course. Skeleton: Cervical degenerative change. No acute skeletal abnormality. Other neck: Negative Upper chest: Advanced apical emphysema. Small loculated right pleural effusion. Apical scarring bilaterally. Review of the MIP  images confirms the above findings CTA HEAD FINDINGS Anterior circulation: Moderate stenosis cavernous carotid bilaterally due to atherosclerotic calcification. Hypoplastic right A1 segment. Both anterior cerebral arteries supplied from the left and patent. M1 segment patent bilaterally without stenosis. Atherosclerotic  irregularity right M2 and M3 branches. There appears to be an occlusion of a small right M2/M3 branch which was patent on the prior study. Atherosclerotic irregularity in left M2 and M3 branches without occlusion. Posterior circulation: Right vertebral artery dominant with mild atherosclerotic disease distally. Right PICA patent. Left vertebral artery patent to PICA with minimal contribution to the basilar. Basilar patent without stenosis. Superior cerebellar and posterior cerebral arteries patent bilaterally without significant stenosis. Venous sinuses: Normal venous enhancement Anatomic variants: None Review of the MIP images confirms the above findings IMPRESSION: 1. Extensive atherosclerotic disease in the aortic arch and proximal great vessels 2. Atherosclerotic disease in the carotid bifurcation without stenosis bilaterally 3. Moderate stenosis proximal and mid right vertebral artery due to atherosclerotic disease. Left vertebral artery is small but without significant disease. 4. Moderate stenosis in the cavernous carotid bilaterally due to atherosclerotic disease 5. Atherosclerotic disease in the middle cerebral arteries bilaterally. There appears to be a small branch occlusion of the right middle cerebral artery involving right M2/M3 branch. This appeared patent on prior CTA October 2021. 6. Code stroke imaging results were communicated on 05/02/2021 at 5:19 pm to provider [place] via text page Electronically Signed   By: Franchot Gallo M.D.   On: 05/02/2021 17:20   CT Angio Neck W and/or Wo Contrast  Result Date: 05/02/2021 CLINICAL DATA:  Left-sided deficit.  Stroke. EXAM: CT ANGIOGRAPHY HEAD AND NECK TECHNIQUE: Multidetector CT imaging of the head and neck was performed using the standard protocol during bolus administration of intravenous contrast. Multiplanar CT image reconstructions and MIPs were obtained to evaluate the vascular anatomy. Carotid stenosis measurements (when applicable) are obtained  utilizing NASCET criteria, using the distal internal carotid diameter as the denominator. CONTRAST:  52mL OMNIPAQUE IOHEXOL 350 MG/ML SOLN COMPARISON:  CT head 05/02/2021.  CT angio head and neck 10/13/2020 FINDINGS: CTA NECK FINDINGS Aortic arch: Atherosclerotic calcification aortic arch and proximal great vessels without significant stenosis. Left vertebral artery origin from the arch. Extensive atherosclerotic disease in the proximal great vessels. Right carotid system: Atherosclerotic calcification throughout the right carotid bifurcation without significant stenosis. Left carotid system: Extensive atherosclerotic calcification left carotid bifurcation without significant stenosis. Atherosclerotic calcification throughout the left common carotid without stenosis. Vertebral arteries: Right vertebral artery dominant. Multiple areas of moderate stenosis in the mid right vertebral artery due to atherosclerotic calcification. Left vertebral artery origin from the arch without significant stenosis throughout its course. Skeleton: Cervical degenerative change. No acute skeletal abnormality. Other neck: Negative Upper chest: Advanced apical emphysema. Small loculated right pleural effusion. Apical scarring bilaterally. Review of the MIP images confirms the above findings CTA HEAD FINDINGS Anterior circulation: Moderate stenosis cavernous carotid bilaterally due to atherosclerotic calcification. Hypoplastic right A1 segment. Both anterior cerebral arteries supplied from the left and patent. M1 segment patent bilaterally without stenosis. Atherosclerotic irregularity right M2 and M3 branches. There appears to be an occlusion of a small right M2/M3 branch which was patent on the prior study. Atherosclerotic irregularity in left M2 and M3 branches without occlusion. Posterior circulation: Right vertebral artery dominant with mild atherosclerotic disease distally. Right PICA patent. Left vertebral artery patent to PICA with  minimal contribution to the basilar. Basilar patent without stenosis. Superior cerebellar and posterior cerebral arteries patent bilaterally  without significant stenosis. Venous sinuses: Normal venous enhancement Anatomic variants: None Review of the MIP images confirms the above findings IMPRESSION: 1. Extensive atherosclerotic disease in the aortic arch and proximal great vessels 2. Atherosclerotic disease in the carotid bifurcation without stenosis bilaterally 3. Moderate stenosis proximal and mid right vertebral artery due to atherosclerotic disease. Left vertebral artery is small but without significant disease. 4. Moderate stenosis in the cavernous carotid bilaterally due to atherosclerotic disease 5. Atherosclerotic disease in the middle cerebral arteries bilaterally. There appears to be a small branch occlusion of the right middle cerebral artery involving right M2/M3 branch. This appeared patent on prior CTA October 2021. 6. Code stroke imaging results were communicated on 05/02/2021 at 5:19 pm to provider [place] via text page Electronically Signed   By: Franchot Gallo M.D.   On: 05/02/2021 17:20   CT HEAD CODE STROKE WO CONTRAST  Result Date: 05/02/2021 CLINICAL DATA:  Code stroke. Acute neuro deficit. Left-sided deficit. EXAM: CT HEAD WITHOUT CONTRAST TECHNIQUE: Contiguous axial images were obtained from the base of the skull through the vertex without intravenous contrast. COMPARISON:  MRI head 10/13/2020 FINDINGS: Brain: Generalized atrophy. Mild white matter hypodensity bilaterally appears chronic. Negative for acute infarct, hemorrhage, mass. Vascular: Negative for hyperdense vessel Skull: Negative Sinuses/Orbits: Mucosal edema and air-fluid level left maxillary sinus with associated bony thickening. Small air-fluid level sphenoid sinus. Right mastoid tip effusion which was also noted in 2021. There is dehiscence of the anterior mastoid tip extending into the external auditory canal. Possible  neoplasm or cholesteatoma. No change from CT angio head 10/13/2020 Other: None ASPECTS [facility] Stroke Program Early CT Score) - Ganglionic level infarction (caudate, lentiform nuclei, internal capsule, insula, M1-M3 cortex): 7 - Supraganglionic infarction (M4-M6 cortex): 3 Total score (0-10 with 10 being normal): 10 IMPRESSION: 1. Generalized atrophy.  No acute intracranial abnormality. 2. ASPECTS is 10 3. Right mastoid opacification with bony dehiscence of the anterior wall of mastoid tip extending into the external auditory canal. Possible cholesteatoma or neoplasm. Recommend physical exam of the right external auditory canal. 4. Code stroke imaging results were communicated on 05/02/2021 at 4:56 pm to provider [place] via text page Electronically Signed   By: Franchot Gallo M.D.   On: 05/02/2021 16:57    Procedures Procedures   Medications Ordered in ED Medications  sodium chloride flush (NS) 0.9 % injection 3 mL (has no administration in time range)  alteplase (ACTIVASE) 1 mg/mL infusion 68.9 mg (68.9 mg Intravenous New Bag/Given 05/02/21 1657)    Followed by  0.9 %  sodium chloride infusion (has no administration in time range)   stroke: mapping our early stages of recovery book (has no administration in time range)  acetaminophen (TYLENOL) tablet 650 mg (has no administration in time range)    Or  acetaminophen (TYLENOL) 160 MG/5ML solution 650 mg (has no administration in time range)    Or  acetaminophen (TYLENOL) suppository 650 mg (has no administration in time range)  senna-docusate (Senokot-S) tablet 1 tablet (has no administration in time range)  pantoprazole (PROTONIX) injection 40 mg (has no administration in time range)  0.9 %  sodium chloride infusion (has no administration in time range)  clevidipine (CLEVIPREX) infusion 0.5 mg/mL (has no administration in time range)  albuterol (VENTOLIN HFA) 108 (90 Base) MCG/ACT inhaler 2 puff (has no administration in time range)   atorvastatin (LIPITOR) tablet 40 mg (has no administration in time range)  bisoprolol (ZEBETA) tablet 5 mg (has no administration in time range)  mometasone-formoterol (DULERA) 200-5 MCG/ACT inhaler 2 puff (has no administration in time range)  furosemide (LASIX) tablet 40 mg (has no administration in time range)  Magnesium Oxide TABS 420 mg (has no administration in time range)  iohexol (OMNIPAQUE) 350 MG/ML injection 75 mL (75 mLs Intravenous Contrast Given 05/02/21 1701)    ED Course  I have reviewed the triage vital signs and the nursing notes.  Pertinent labs & imaging results that were available during my care of the patient were reviewed by me and considered in my medical decision making (see chart for details).    MDM Rules/Calculators/A&P                          Brodey Gillogly is here with stroke symptoms.  Code stroke on arrival.  Symptoms started less than an hour ago.  Left-sided weakness, left-sided facial droop, left-sided neglect.  History of chronic pancreatitis, chronic alcoholism, CAD, diabetes, heart failure, A. fib.  Patient not on blood thinners.  CT scan showed no head bleed.  Neurology at the bedside upon patient arrival.  After talking on the phone with family decision was made to give tPA.  Patient with otherwise unremarkable lab work.  He is DNR.  Patient to be admitted to the neurological ICU given that he has received tPA.  Hemodynamically stable throughout my care.  This chart was dictated using voice recognition software.  Despite best efforts to proofread,  errors can occur which can change the documentation meaning.    Final Clinical Impression(s) / ED Diagnoses Final diagnoses:  Cerebrovascular accident (CVA), unspecified mechanism [facility])    Rx / DC Orders ED Discharge Orders    None       Lennice Sites, DO 05/02/21 1730

## 2021-05-02 NOTE — H&P (Addendum)
NEURO HOSPITALIST HISTORY AND PHYSICAL    Requestig physician: Dr. Ronnald Nian  Reason for Consult: Acute onset of left sided weakness and left hemineglect  History obtained from:  Patient's Family, EMS and Chart    HPI:                                                                                                                                          Brandon [name] is an 75 y.o. male with a medical history significant for coronary artery disease s/p stenting earlier this year following an NSTEMI, paroxysmal atrial fibrillation not on home anticoagulation, hypertension, peripheral vascular disease, type 2 diabetes mellitus, vitamin B12 deficiency, COPD, chronic hypoxic respiratory failure on home oxygen, and remote history of alcoholism and tobacco use who presented today via EMS after being found by his son-in-law to have left hemiparesis, left facial droop, and dysarthria. Patient states that he was being picked up by his son-in-law to be transported to a hospice facility due to complications with his emphysema and diabetes. Of note, Brandon [name] has had at least one hospital admission per month this year with worsening respiratory and cardiac status. He has been living independently and able to complete ADLs without complication until the last few days. His daughter states that over the last few days he has become progressively weak and required assistance with his ADLs prompting him to seek hospice care as she and her family are unable to care for him.   Last Known Well: 15:30  tPA: given at 16:57 IR: Not an endovascular candidate due to DNR/DNI status, as mechanical thrombectomy would require intubation for anesthesia; family would not consider rescinding DNR for procedure mRS: 0 until several days prior to presentation, subsequently deteriorated to an mRS of 4  Past Medical History:  Diagnosis Date  . Abdominal aortic aneurysm without rupture ([place])   . CAD (coronary  artery disease)    severe on chest CT   . Chronic alcoholism ([place])   . Chronic idiopathic constipation   . Chronic pancreatitis ([place])   . Chronic respiratory failure with hypoxia ([place])   . Essential hypertension   . GERD (gastroesophageal reflux disease)   . Hyperaldosteronism ([place])   . Malignant neoplasm of prostate ([place])   . Nicotine dependence   . Paroxysmal atrial tachycardia ([place])   . Peripheral vascular disease ([place])   . Serrated polyp of colon   . Type 2 diabetes mellitus ([place])    Past Surgical History:  Procedure Laterality Date  . CORONARY ATHERECTOMY N/A 01/11/2021   Procedure: CORONARY ATHERECTOMY;  Surgeon: Sherren Mocha, MD;  Location: [place] [facility];  Service: Cardiovascular;  Laterality: N/A;  . CORONARY ATHERECTOMY N/A 01/13/2021   Procedure: CORONARY ATHERECTOMY;  Surgeon: Martinique, Peter M, MD;  Location: [facility]  INVASIVE CV LAB;  Service: Cardiovascular;  Laterality: N/A;  . CORONARY STENT INTERVENTION N/A 01/13/2021   Procedure: CORONARY STENT INTERVENTION;  Surgeon: Martinique, Peter M, MD;  Location: [place] [facility];  Service: Cardiovascular;  Laterality: N/A;  . INTRAVASCULAR ULTRASOUND/IVUS N/A 01/13/2021   Procedure: Intravascular Ultrasound/IVUS;  Surgeon: Martinique, Peter M, MD;  Location: [place] [facility];  Service: Cardiovascular;  Laterality: N/A;  . PROSTATECTOMY  12/2012  . RIGHT/LEFT HEART CATH AND CORONARY ANGIOGRAPHY N/A 01/11/2021   Procedure: RIGHT/LEFT HEART CATH AND CORONARY ANGIOGRAPHY;  Surgeon: Sherren Mocha, MD;  Location: [place] [facility];  Service: Cardiovascular;  Laterality: N/A;   Family History  Problem Relation Age of Onset  . Hypertension Mother   . Hypertension Father       Social History:  reports that he quit smoking about 6 months ago. His smoking use included cigarettes. He has a 30.00 pack-year smoking history. He has never used smokeless tobacco. He reports previous alcohol use. He reports that he does not use  drugs.  Allergies  Allergen Reactions  . Hydrochlorothiazide Other (See Comments)    Hypokalemia (Pt claims not allergic 04/09/21)   MEDICATIONS:                                                                                                                     Current Outpatient Medications  Medication Instructions  . albuterol (VENTOLIN HFA) 108 (90 Base) MCG/ACT inhaler 2 puffs, Inhalation, Every 4 hours PRN  . aspirin 81 mg, Oral, Daily  . atorvastatin (LIPITOR) 40 mg, Oral, Daily at bedtime  . bisoprolol (ZEBETA) 5 mg, Oral, Daily  . clopidogrel (PLAVIX) 75 mg, Oral, Daily  . diltiazem (CARDIZEM CD) 240 mg, Oral, Daily  . empagliflozin (JARDIANCE) 10 MG TABS tablet TAKE 1 TABLET (10 MG TOTAL) BY MOUTH DAILY.  Marland Kitchen empagliflozin (JARDIANCE) 10 mg, Oral, Daily before breakfast  . feeding supplement, GLUCERNA SHAKE, (GLUCERNA SHAKE) LIQD 237 mLs, Oral, 2 times daily between meals  . Fluticasone-Salmeterol (ADVAIR) 250-50 MCG/DOSE AEPB 1 puff, Inhalation, 2 times daily  . furosemide (LASIX) 40 MG tablet TAKE 1 TABLET (40 MG TOTAL) BY MOUTH TWO TIMES DAILY.  . furosemide (LASIX) 40 mg, Oral, 2 times daily  . insulin aspart protamine - aspart (NOVOLOG 70/30 MIX) (70-30) 100 UNIT/ML FlexPen 20 Units, Subcutaneous, 2 times daily with meals  . Magnesium Oxide 420 mg, Oral, Daily  . metFORMIN (GLUCOPHAGE) 1000 MG tablet Oral, 2 times daily with meals  . Multiple Vitamin (MULTIVITAMIN WITH MINERALS) TABS tablet 1 tablet, Oral, Daily  . nortriptyline (PAMELOR) 50 mg, Oral, Daily at bedtime, For restless leg symdrome  . Omega-3 1,000 mg, Oral, 2 times daily  . OXYGEN 4.5 L/min, Inhalation, Continuous  . pantoprazole (PROTONIX) 40 mg, Oral, Daily  . polyethylene glycol (MIRALAX / GLYCOLAX) 17 g, Oral, Daily PRN, Mix with water or other liquid as instructed before drinking  . potassium chloride (KLOR-CON) 10 MEQ tablet 20 mEq, Oral, Daily  . potassium chloride (  KLOR-CON) 10 MEQ tablet TAKE 2  TABLETS (20 MEQ TOTAL) BY MOUTH DAILY.  Marland Kitchen senna-docusate (SENOKOT-S) 8.6-50 MG tablet 1 tablet, Oral, At bedtime PRN  . Tiotropium Bromide Monohydrate 2.5 MCG/ACT AERS 2 puffs, Inhalation, Daily   ROS:                                                                                                                                       History obtained from chart review   General ROS: negative for - chills, fatigue, fever, night sweats Psychological ROS: negative for -  hallucinations, memory difficulties  Ophthalmic ROS: negative for - blurry vision, double vision, or loss of vision ENT ROS: negative for - epistaxis, sore throat, tinnitus or vertigo Allergy and Immunology ROS: negative for - hives or itchy/watery eyes Hematological and Lymphatic ROS: negative for - bleeding problems or swollen lymph nodes Endocrine ROS: negative for - polydipsia/polyuria or temperature intolerance Respiratory ROS: negative for - cough, hemoptysis Cardiovascular ROS: negative for - chest pain Gastrointestinal ROS: negative for - abdominal pain, diarrhea, nausea/vomiting  Genito-Urinary ROS: negative for - hematuria or urinary frequency/urgency Musculoskeletal ROS: negative for - joint swelling or warmth Neurological ROS: as noted in HPI Dermatological ROS: negative for rash and skin lesion changes  Blood pressure 126/81, pulse (!) 52, resp. rate 17, weight 76.6 kg.  General Examination:                                                                                                       Physical Exam  Head: Normocephalic without obvious abnormality EENT: Normal external eye and conjunctiva. No OP obstruction. Patient is edentulous.  Psych: Affect appropriate for situation Cardiovascular: Rapid rate and rhythm initially on cardiac monitor Lungs: Tachypneic on nonrebreather, no excessive work of breathing.  Abdomen: Rounded, soft Extremities: Warm, dry and intact Musculoskeletal: no joint tenderness,  deformity. Bilateral lower extremities with erythema and 2+ nonpitting edema Skin- Warm and dry, scaling erythema of bilateral lower extremities  Neurological Examination Mental Status: Awake, alert to self. States incorrectly that he is 75 years old and that the month is April. Speech is with moderate dysarthria. Patient is not aphasic. He is able to provide some history of present illness but is unable to give detail of left sided deficits. Follows all commands. Cranial Nerves: II: PERRL. No field cut.  III,IV, VI: Rightward gaze preference, able to cross midline with oculocephalic reflex but not to command. Ptosis not present. V,VII: Left facial droop present,  facial light touch sensation normal bilaterally VIII: Hearing normal bilaterally IX,X: Palate rises symmetrically XI: Shoulder shrug present on the right, absent on the left XII: Tongue protrudes leftward Motor: Right : Upper extremity   5/5    Left:     Upper extremity   0/5  Lower extremity   5/5     Lower extremity   1/5 Sensory: Sensation to light touch intact throughout, bilaterally Deep Tendon Reflexes: 2+ and symmetric biceps Plantars: Right: downgoing   Left: mute Cerebellar: Normal finger-to-nose with RUE. Unable to assess LUE.  Gait: Deferred  1a Level of Conscious.: 0 1b LOC Questions: 2 1c LOC Commands: 0 2 Best Gaze: 1 3 Visual: 0 4 Facial Palsy: 2 5a Motor Arm - left: 4 5b Motor Arm - Right: 0 6a Motor Leg - Left: 3 6b Motor Leg - Right: 0 7 Limb Ataxia: 0 8 Sensory: 0 9 Best Language: 0 10 Dysarthria: 1 11 Extinct. and Inatten.: 0 TOTAL: 13  Lab Results: Basic Metabolic Panel: Recent Labs  Lab 05/02/21 1637 05/02/21 1651  NA 135 135  K 3.8 3.7  CL 99 99  CO2 23  --   GLUCOSE 201* 198*  BUN 29* 27*  CREATININE 1.49* 1.40*  CALCIUM 8.6*  --     CBC: Recent Labs  Lab 05/02/21 1637 05/02/21 1651  WBC 10.3  --   NEUTROABS 8.3*  --   HGB 10.5* 11.6*  HCT 34.8* 34.0*  MCV 85.7  --    PLT 343  --     Cardiac Enzymes: No results for input(s): CKTOTAL, CKMB, CKMBINDEX, TROPONINI in the last 168 hours.  Lipid Panel: No results for input(s): CHOL, TRIG, HDL, CHOLHDL, VLDL, LDLCALC in the last 168 hours.  Imaging: CT Angio Head W or Wo Contrast  Result Date: 05/02/2021 CLINICAL DATA:  Left-sided deficit.  Stroke. EXAM: CT ANGIOGRAPHY HEAD AND NECK TECHNIQUE: Multidetector CT imaging of the head and neck was performed using the standard protocol during bolus administration of intravenous contrast. Multiplanar CT image reconstructions and MIPs were obtained to evaluate the vascular anatomy. Carotid stenosis measurements (when applicable) are obtained utilizing NASCET criteria, using the distal internal carotid diameter as the denominator. CONTRAST:  78mL OMNIPAQUE IOHEXOL 350 MG/ML SOLN COMPARISON:  CT head 05/02/2021.  CT angio head and neck 10/13/2020 FINDINGS: CTA NECK FINDINGS Aortic arch: Atherosclerotic calcification aortic arch and proximal great vessels without significant stenosis. Left vertebral artery origin from the arch. Extensive atherosclerotic disease in the proximal great vessels. Right carotid system: Atherosclerotic calcification throughout the right carotid bifurcation without significant stenosis. Left carotid system: Extensive atherosclerotic calcification left carotid bifurcation without significant stenosis. Atherosclerotic calcification throughout the left common carotid without stenosis. Vertebral arteries: Right vertebral artery dominant. Multiple areas of moderate stenosis in the mid right vertebral artery due to atherosclerotic calcification. Left vertebral artery origin from the arch without significant stenosis throughout its course. Skeleton: Cervical degenerative change. No acute skeletal abnormality. Other neck: Negative Upper chest: Advanced apical emphysema. Small loculated right pleural effusion. Apical scarring bilaterally. Review of the MIP images  confirms the above findings CTA HEAD FINDINGS Anterior circulation: Moderate stenosis cavernous carotid bilaterally due to atherosclerotic calcification. Hypoplastic right A1 segment. Both anterior cerebral arteries supplied from the left and patent. M1 segment patent bilaterally without stenosis. Atherosclerotic irregularity right M2 and M3 branches. There appears to be an occlusion of a small right M2/M3 branch which was patent on the prior study. Atherosclerotic irregularity in left M2 and M3  branches without occlusion. Posterior circulation: Right vertebral artery dominant with mild atherosclerotic disease distally. Right PICA patent. Left vertebral artery patent to PICA with minimal contribution to the basilar. Basilar patent without stenosis. Superior cerebellar and posterior cerebral arteries patent bilaterally without significant stenosis. Venous sinuses: Normal venous enhancement Anatomic variants: None Review of the MIP images confirms the above findings IMPRESSION: 1. Extensive atherosclerotic disease in the aortic arch and proximal great vessels 2. Atherosclerotic disease in the carotid bifurcation without stenosis bilaterally 3. Moderate stenosis proximal and mid right vertebral artery due to atherosclerotic disease. Left vertebral artery is small but without significant disease. 4. Moderate stenosis in the cavernous carotid bilaterally due to atherosclerotic disease 5. Atherosclerotic disease in the middle cerebral arteries bilaterally. There appears to be a small branch occlusion of the right middle cerebral artery involving right M2/M3 branch. This appeared patent on prior CTA October 2021. 6. Code stroke imaging results were communicated on 05/02/2021 at 5:19 pm to provider Dartagnan Beavers via text page Electronically Signed   By: Franchot Gallo M.D.   On: 05/02/2021 17:20   CT Angio Neck W and/or Wo Contrast  Result Date: 05/02/2021 CLINICAL DATA:  Left-sided deficit.  Stroke. EXAM: CT ANGIOGRAPHY HEAD  AND NECK TECHNIQUE: Multidetector CT imaging of the head and neck was performed using the standard protocol during bolus administration of intravenous contrast. Multiplanar CT image reconstructions and MIPs were obtained to evaluate the vascular anatomy. Carotid stenosis measurements (when applicable) are obtained utilizing NASCET criteria, using the distal internal carotid diameter as the denominator. CONTRAST:  27mL OMNIPAQUE IOHEXOL 350 MG/ML SOLN COMPARISON:  CT head 05/02/2021.  CT angio head and neck 10/13/2020 FINDINGS: CTA NECK FINDINGS Aortic arch: Atherosclerotic calcification aortic arch and proximal great vessels without significant stenosis. Left vertebral artery origin from the arch. Extensive atherosclerotic disease in the proximal great vessels. Right carotid system: Atherosclerotic calcification throughout the right carotid bifurcation without significant stenosis. Left carotid system: Extensive atherosclerotic calcification left carotid bifurcation without significant stenosis. Atherosclerotic calcification throughout the left common carotid without stenosis. Vertebral arteries: Right vertebral artery dominant. Multiple areas of moderate stenosis in the mid right vertebral artery due to atherosclerotic calcification. Left vertebral artery origin from the arch without significant stenosis throughout its course. Skeleton: Cervical degenerative change. No acute skeletal abnormality. Other neck: Negative Upper chest: Advanced apical emphysema. Small loculated right pleural effusion. Apical scarring bilaterally. Review of the MIP images confirms the above findings CTA HEAD FINDINGS Anterior circulation: Moderate stenosis cavernous carotid bilaterally due to atherosclerotic calcification. Hypoplastic right A1 segment. Both anterior cerebral arteries supplied from the left and patent. M1 segment patent bilaterally without stenosis. Atherosclerotic irregularity right M2 and M3 branches. There appears to be  an occlusion of a small right M2/M3 branch which was patent on the prior study. Atherosclerotic irregularity in left M2 and M3 branches without occlusion. Posterior circulation: Right vertebral artery dominant with mild atherosclerotic disease distally. Right PICA patent. Left vertebral artery patent to PICA with minimal contribution to the basilar. Basilar patent without stenosis. Superior cerebellar and posterior cerebral arteries patent bilaterally without significant stenosis. Venous sinuses: Normal venous enhancement Anatomic variants: None Review of the MIP images confirms the above findings IMPRESSION: 1. Extensive atherosclerotic disease in the aortic arch and proximal great vessels 2. Atherosclerotic disease in the carotid bifurcation without stenosis bilaterally 3. Moderate stenosis proximal and mid right vertebral artery due to atherosclerotic disease. Left vertebral artery is small but without significant disease. 4. Moderate stenosis in the cavernous carotid bilaterally  due to atherosclerotic disease 5. Atherosclerotic disease in the middle cerebral arteries bilaterally. There appears to be a small branch occlusion of the right middle cerebral artery involving right M2/M3 branch. This appeared patent on prior CTA October 2021. 6. Code stroke imaging results were communicated on 05/02/2021 at 5:19 pm to provider Rayen Palen via text page Electronically Signed   By: Franchot Gallo M.D.   On: 05/02/2021 17:20   CT HEAD CODE STROKE WO CONTRAST  Result Date: 05/02/2021 CLINICAL DATA:  Code stroke. Acute neuro deficit. Left-sided deficit. EXAM: CT HEAD WITHOUT CONTRAST TECHNIQUE: Contiguous axial images were obtained from the base of the skull through the vertex without intravenous contrast. COMPARISON:  MRI head 10/13/2020 FINDINGS: Brain: Generalized atrophy. Mild white matter hypodensity bilaterally appears chronic. Negative for acute infarct, hemorrhage, mass. Vascular: Negative for hyperdense vessel Skull:  Negative Sinuses/Orbits: Mucosal edema and air-fluid level left maxillary sinus with associated bony thickening. Small air-fluid level sphenoid sinus. Right mastoid tip effusion which was also noted in 2021. There is dehiscence of the anterior mastoid tip extending into the external auditory canal. Possible neoplasm or cholesteatoma. No change from CT angio head 10/13/2020 Other: None ASPECTS [facility] Stroke Program Early CT Score) - Ganglionic level infarction (caudate, lentiform nuclei, internal capsule, insula, M1-M3 cortex): 7 - Supraganglionic infarction (M4-M6 cortex): 3 Total score (0-10 with 10 being normal): 10 IMPRESSION: 1. Generalized atrophy.  No acute intracranial abnormality. 2. ASPECTS is 10 3. Right mastoid opacification with bony dehiscence of the anterior wall of mastoid tip extending into the external auditory canal. Possible cholesteatoma or neoplasm. Recommend physical exam of the right external auditory canal. 4. Code stroke imaging results were communicated on 05/02/2021 at 4:56 pm to provider Joely Losier via text page Electronically Signed   By: Franchot Gallo M.D.   On: 05/02/2021 16:57   Assessment: 75 year old male hospice patient presenting with acute onset of left sided weakness.  1. Examination reveals patient with left hemiparesis, left facial droop, and dysarthria. NIHSS 13 initially.  2. CT head without acute intracranial abnormality; generalized atrophy is noted. 3. CTA of head and neck reveals extensive atherosclerotic disease in the aortic arch and proximal great vessels, atherosclerotic disease in the carotid bifurcation without stenosis, moderate stenosis proximal and mid right vertebral artery due to atherosclerotic disease, and atherosclerotic disease in the middle cerebral arteries bilaterally with a small branch occlusion of the right middle cerebral artery involving the right M2/M3 branch.  4. Presentation is most consistent with an acute cerebral infarction. tPA given  after discussion with daughter via telephone. Not a IR candidate due to DNR/DNI status, discussed with family.  5. Stroke risk factors include age, CAD, hypertension, remote smoking history, PVD, type 2 DM, paroxysmal AF not on anticoagulation, and chronic hypoxic respiratory failure.   Plan:  Acute Ischemic Stroke Acuity: Acute Current Suspected Etiology: Atherosclerosis with artery to artery embolization or in situ thrombosis versus cardioembolic Continue Evaluation:  -Admit to: ICU -Hold Aspirin and Plavix until 24 hour post tPA; can restart if neuroimaging at 24 hours is stable and without evidence of bleeding -Post-tPA blood pressure control, goal of SYS < 180  -MRI/ECHO/A1C/Lipid panel. - CT stability scan at 24 hours -Hyperglycemia management per SSI to maintain glucose 140-180mg /dL. -PT/OT/ST therapies and recommendations when able - Cardiac telemetry - Statin - Dysarthric. NPO until cleared by speech therapy. Advance diet as tolerated -PT/OT -PM&R consult  RESP Chronic Hypoxic Respiratory Failure  - On 4.5L home oxygen - Nasal cannula as  needed to maintain SpO2 > 88% - CCM has been consulted  CV Essential (primary) hypertension -Aggressive BP control, goal SBP < 180 / < 105  - Heart failure, unspecified Chronic heart failure HFpEF Paroxysmal atrial fibrillation -TTE - On home Diltiazem for rate control, not on home AC - CCM is assisting  HEME Anemia in Chronic Disease -Monitor -Transfuse for hgb < 7  ENDO Type 2 diabetes mellitus with hyperglycemia  -SSI -Start oral meds -goal HgbA1c < 7%  GI/GU GFR 49 on arrival  -Gentle hydration -avoid nephrotoxic agents  Fluid/Electrolyte Disorders AM BMP  -Replete -Repeat labs -Trend  ID No evidence of infection on admission - AM CBC, trend fever and WBC  Nutrition E46 Protein-Calorie Malnutrition Mild Moderate Severe -diet consult  Prophylaxis DVT:  SCDs GI: PPI Bowel: Docusate / Senna  Diet: NPO  until cleared by speech  Code Status: DNR  THE FOLLOWING WERE PRESENT ON ADMISSION: CNS -  Acute Ischemic Stroke, Hemiparesis Respiratory - Chronic Hypoxic Respiratory failure,  Cardiovascular - Chronic HFpEF, recent NSTEMI (January 2022), Cardiac Arrhythmia Infectious - N/A GI - N/A Renal -  N/A Heme-  History of anemia Cancer - N/A Trauma - N/A DNI/DNR   60 minutes spent in the emergent neurological evaluation and management of this critically ill patient.  Electronically signed: Dr. Kerney Elbe 05/02/2021, 4:42 PM

## 2021-05-02 NOTE — Progress Notes (Signed)
PHARMACIST CODE STROKE RESPONSE  Notified to mix tPA at 1649 by Dr. Cheral Marker Delivered tPA to RN at 1652  tPA dose = 6.9mg  bolus over 1 minute followed by 62mg  for a total dose of 68.9mg  over 1 hour  Issues/delays encountered (if applicable): Access issues for CT angiogram and tPA administration  Brandon [name] 05/02/21 5:01 PM

## 2021-05-02 NOTE — ED Notes (Signed)
Attempted repot x1.  

## 2021-05-02 NOTE — ED Notes (Signed)
Attempted report x 2 

## 2021-05-02 NOTE — Progress Notes (Signed)
[facility] Progress Note Patient Name: Brandon [name] DOB: 1946-01-02 MRN: [id number]   Date of Service  05/02/2021  HPI/Events of Note  Patient admitted with left hemiparesis and CTA brain consistent with right M2 / M3  MCA vascular occlusion, patient received TPA and is receiving post-TPA protocol care in 4 N ICU.  eICU Interventions  New Patient Evaluation completed.        Kerry Kass [place] 05/02/2021, 8:31 PM

## 2021-05-03 ENCOUNTER — Other Ambulatory Visit: Payer: Self-pay

## 2021-05-03 DIAGNOSIS — Z9282 Status post administration of tPA (rtPA) in a different facility within the last 24 hours prior to admission to current facility: Secondary | ICD-10-CM

## 2021-05-03 DIAGNOSIS — I480 Paroxysmal atrial fibrillation: Secondary | ICD-10-CM

## 2021-05-03 DIAGNOSIS — I639 Cerebral infarction, unspecified: Secondary | ICD-10-CM | POA: Diagnosis not present

## 2021-05-03 DIAGNOSIS — J9611 Chronic respiratory failure with hypoxia: Secondary | ICD-10-CM

## 2021-05-03 DIAGNOSIS — Z66 Do not resuscitate: Secondary | ICD-10-CM

## 2021-05-03 DIAGNOSIS — J9621 Acute and chronic respiratory failure with hypoxia: Secondary | ICD-10-CM | POA: Diagnosis not present

## 2021-05-03 DIAGNOSIS — Z7189 Other specified counseling: Secondary | ICD-10-CM

## 2021-05-03 DIAGNOSIS — I63411 Cerebral infarction due to embolism of right middle cerebral artery: Principal | ICD-10-CM

## 2021-05-03 LAB — LIPID PANEL
Cholesterol: 114 mg/dL (ref 0–200)
HDL: 58 mg/dL (ref >40)
LDL Cholesterol: 46 mg/dL (ref 0–99)
Total CHOL/HDL Ratio: 2 RATIO
Triglycerides: 49 mg/dL (ref <150)
VLDL: 10 mg/dL (ref 0–40)

## 2021-05-03 LAB — GLUCOSE, CAPILLARY
Glucose-Capillary: 114 mg/dL — ABNORMAL HIGH (ref 70–99)
Glucose-Capillary: 52 mg/dL — ABNORMAL LOW (ref 70–99)
Glucose-Capillary: 62 mg/dL — ABNORMAL LOW (ref 70–99)
Glucose-Capillary: 63 mg/dL — ABNORMAL LOW (ref 70–99)
Glucose-Capillary: 68 mg/dL — ABNORMAL LOW (ref 70–99)
Glucose-Capillary: 74 mg/dL (ref 70–99)
Glucose-Capillary: 83 mg/dL (ref 70–99)
Glucose-Capillary: 84 mg/dL (ref 70–99)

## 2021-05-03 LAB — HEMOGLOBIN A1C
Hgb A1c MFr Bld: 8.2 % — ABNORMAL HIGH (ref 4.8–5.6)
Mean Plasma Glucose: 188.64 mg/dL

## 2021-05-03 MED ORDER — DEXTROSE 50 % IV SOLN
12.5000 g | INTRAVENOUS | Status: DC
  Filled 2021-05-03: qty 50

## 2021-05-03 MED ORDER — DEXTROSE 20 % IV SOLN
INTRAVENOUS | Status: DC
  Filled 2021-05-03 (×2): qty 500

## 2021-05-03 MED ORDER — SODIUM CHLORIDE 0.9 % IV SOLN
6.2500 mg | Freq: Once | INTRAVENOUS | Status: AC
  Administered 2021-05-04: 6.25 mg via INTRAVENOUS
  Filled 2021-05-03: qty 0.25

## 2021-05-03 MED ORDER — LORAZEPAM 1 MG PO TABS: 1.0000 mg | ORAL_TABLET | ORAL | Status: DC | PRN

## 2021-05-03 MED ORDER — DEXTROSE 50 % IV SOLN
INTRAVENOUS | Status: AC
  Filled 2021-05-03: qty 50

## 2021-05-03 MED ORDER — LORAZEPAM 2 MG/ML PO CONC: 1.0000 mg | ORAL | Status: DC | PRN

## 2021-05-03 MED ORDER — ACETAMINOPHEN 325 MG PO TABS: 650.0000 mg | ORAL_TABLET | Freq: Four times a day (QID) | ORAL | Status: DC | PRN

## 2021-05-03 MED ORDER — METOPROLOL TARTRATE 5 MG/5ML IV SOLN: 5.0000 mg | Freq: Four times a day (QID) | INTRAVENOUS | Status: DC | PRN

## 2021-05-03 MED ORDER — BIOTENE DRY MOUTH MT LIQD: 15.0000 mL | OROMUCOSAL | Status: DC | PRN

## 2021-05-03 MED ORDER — DEXTROSE 50 % IV SOLN
12.5000 g | INTRAVENOUS | Status: AC
  Administered 2021-05-03: 12.5 g via INTRAVENOUS

## 2021-05-03 MED ORDER — MORPHINE SULFATE (PF) 2 MG/ML IV SOLN
1.0000 mg | INTRAVENOUS | Status: DC | PRN
  Administered 2021-05-04 (×4): 1 mg via INTRAVENOUS
  Filled 2021-05-03 (×4): qty 1

## 2021-05-03 MED ORDER — CHLORHEXIDINE GLUCONATE CLOTH 2 % EX PADS: 6.0000 | MEDICATED_PAD | Freq: Every day | CUTANEOUS | Status: DC

## 2021-05-03 MED ORDER — POLYVINYL ALCOHOL 1.4 % OP SOLN
1.0000 [drp] | Freq: Four times a day (QID) | OPHTHALMIC | Status: DC | PRN
  Filled 2021-05-03: qty 15

## 2021-05-03 MED ORDER — LORAZEPAM 2 MG/ML IJ SOLN
1.0000 mg | INTRAMUSCULAR | Status: DC | PRN
  Administered 2021-05-03: 1 mg via INTRAVENOUS
  Filled 2021-05-03: qty 1

## 2021-05-03 MED ORDER — METOPROLOL TARTRATE 50 MG PO TABS
50.0000 mg | ORAL_TABLET | Freq: Three times a day (TID) | ORAL | Status: DC
  Administered 2021-05-03: 50 mg via ORAL
  Filled 2021-05-03: qty 1

## 2021-05-03 MED ORDER — DEXTROSE 50 % IV SOLN
INTRAVENOUS | Status: AC
  Administered 2021-05-03: 50 mL
  Filled 2021-05-03: qty 50

## 2021-05-03 MED ORDER — METOPROLOL TARTRATE 5 MG/5ML IV SOLN: 5.0000 mg | Freq: Four times a day (QID) | INTRAVENOUS | Status: DC

## 2021-05-03 MED ORDER — ACETAMINOPHEN 650 MG RE SUPP: 650.0000 mg | Freq: Four times a day (QID) | RECTAL | Status: DC | PRN

## 2021-05-03 NOTE — Evaluation (Signed)
Clinical/Bedside Swallow Evaluation Patient Details  Name: Brandon [name] MRN: [id number] Date of Birth: 1946-12-23  Today's Date: 05/03/2021 Time: SLP Start Time (ACUTE ONLY): 0945 SLP Stop Time (ACUTE ONLY): 1008 SLP Time Calculation (min) (ACUTE ONLY): 23 min  Past Medical History:  Past Medical History:  Diagnosis Date  . Abdominal aortic aneurysm without rupture ([place])   . CAD (coronary artery disease)    severe on chest CT   . Chronic alcoholism ([place])   . Chronic idiopathic constipation   . Chronic pancreatitis ([place])   . Chronic respiratory failure with hypoxia ([place])   . Essential hypertension   . GERD (gastroesophageal reflux disease)   . Hyperaldosteronism ([place])   . Malignant neoplasm of prostate ([place])   . Nicotine dependence   . Paroxysmal atrial tachycardia ([place])   . Peripheral vascular disease ([place])   . Serrated polyp of colon   . Type 2 diabetes mellitus ([place])    Past Surgical History:  Past Surgical History:  Procedure Laterality Date  . CORONARY ATHERECTOMY N/A 01/11/2021   Procedure: CORONARY ATHERECTOMY;  Surgeon: Sherren Mocha, MD;  Location: [facility];  Service: Cardiovascular;  Laterality: N/A;  . CORONARY ATHERECTOMY N/A 01/13/2021   Procedure: CORONARY ATHERECTOMY;  Surgeon: Martinique, Peter M, MD;  Location: [facility];  Service: Cardiovascular;  Laterality: N/A;  . CORONARY STENT INTERVENTION N/A 01/13/2021   Procedure: CORONARY STENT INTERVENTION;  Surgeon: Martinique, Peter M, MD;  Location: [facility];  Service: Cardiovascular;  Laterality: N/A;  . INTRAVASCULAR ULTRASOUND/IVUS N/A 01/13/2021   Procedure: Intravascular Ultrasound/IVUS;  Surgeon: Martinique, Peter M, MD;  Location: [facility];  Service: Cardiovascular;  Laterality: N/A;  . PROSTATECTOMY  12/2012  . RIGHT/LEFT HEART CATH AND CORONARY ANGIOGRAPHY N/A 01/11/2021   Procedure: RIGHT/LEFT HEART CATH AND CORONARY ANGIOGRAPHY;  Surgeon: Sherren Mocha, MD;  Location: [facility];  Service: Cardiovascular;  Laterality: N/A;   HPI:  This is a 40 yom w/ hx of Chronic hypoxic respiratory failure 2/2 COPD (2-4 lpm at home),  Was actually to be admitted to hospice as of 5/3 w/ slow and progressive respiratory failure and functional decline. Then on afternoon of 5/3 at 1530 developed sudden onset of slurred speech/dysarthria and left sided weakness. EMS called Code stroke initiated. CT head neg for bleed. Had new AF w/ RVR. Was given IV TPA and this was completed at [facility] / Plan / Recommendation Clinical Impression  Pt demonstrates no signs of aspiration or dysphagia. He has mild left facial weakness that doesnt appear to impact labial seal or oral manipulation of PO. Pt is edentulous and typically wears his dentures with PO, but is able to masticate soft solids with his gums and tolerated a graham cracker snack well. Will initaite a regular diet and thin liquids as pt will request his dentures and make appropriate food choices as needed. Pts cognition and speech are functional for his needs at this time. Will defer further work up given pts more comfort based approach at this time. SLP Visit Diagnosis: Dysphagia, unspecified (R13.10)    Aspiration Risk       Diet Recommendation Regular;Thin liquid   Liquid Administration via: Cup;Straw Medication Administration: Whole meds with liquid Supervision: Patient able to self feed    Other  Recommendations     Follow up Recommendations None      Frequency and Duration            Prognosis  Swallow Study   General HPI: This is a 29 yom w/ hx of Chronic hypoxic respiratory failure 2/2 COPD (2-4 lpm at home),  Was actually to be admitted to hospice as of 5/3 w/ slow and progressive respiratory failure and functional decline. Then on afternoon of 5/3 at 1530 developed sudden onset of slurred speech/dysarthria and left sided weakness. EMS called Code stroke initiated. CT head neg for bleed.  Had new AF w/ RVR. Was given IV TPA and this was completed at 1758 Type of Study: Bedside Swallow Evaluation Diet Prior to this Study: NPO Respiratory Status: Room air History of Recent Intubation: No Behavior/Cognition: Alert;Cooperative;Pleasant mood Oral Cavity Assessment: Dry Oral Care Completed by SLP: No Oral Cavity - Dentition: Edentulous Self-Feeding Abilities: Able to feed self Patient Positioning: Upright in bed Baseline Vocal Quality: Normal Volitional Cough: Strong Volitional Swallow: Able to elicit    Oral/Motor/Sensory Function Overall Oral Motor/Sensory Function: Mild impairment Facial ROM: Reduced left;Suspected CN VII (facial) dysfunction Facial Symmetry: Abnormal symmetry left;Suspected CN VII (facial) dysfunction Facial Strength: Reduced left;Suspected CN VII (facial) dysfunction Lingual ROM: Within Functional Limits Lingual Symmetry: Within Functional Limits Lingual Strength: Within Functional Limits   Ice Chips     Thin Liquid Thin Liquid: Within functional limits    Nectar Thick Nectar Thick Liquid: Not tested   Honey Thick Honey Thick Liquid: Not tested   Puree Puree: Within functional limits   Solid     Solid: Within functional limits     [name], MA [place] Pager [phone] Office [phone]  [name] 05/03/2021,10:40 AM

## 2021-05-03 NOTE — Progress Notes (Signed)
Inpatient Diabetes Program Recommendations  AACE/ADA: New Consensus Statement on Inpatient Glycemic Control (2015)  Target Ranges:  Prepandial:   less than 140 mg/dL      Peak postprandial:   less than 180 mg/dL (1-2 hours)      Critically ill patients:  140 - 180 mg/dL   Lab Results  Component Value Date   GLUCAP 114 (H) 05/03/2021   HGBA1C 8.2 (H) 05/03/2021    Review of Glycemic Control Results for Brandon [name], Brandon [name] (MRN [id number]) as of 05/03/2021 11:28  Ref. Range 05/03/2021 00:48 05/03/2021 04:15 05/03/2021 04:46 05/03/2021 08:19 05/03/2021 08:21 05/03/2021 08:47 05/03/2021 08:52 05/03/2021 10:28  Glucose-Capillary Latest Ref Range: 70 - 99 mg/dL 83 62 (L) 84 29 (LL) 74 68 (L) 52 (L) 114 (H)   Diabetes history: DM2 Outpatient Diabetes medications: Jardiance 10 mg + Metformin 1 gm bid Current orders for Inpatient glycemic control: Novolog correction 0-15 units q 4 hrs.  Inpatient Diabetes Program Recommendations:   -Decrease Novolog correction to 0-6 units tid + hs 0-5 units  Thank you, Bethena Roys E. Thermon Zulauf, RN, MSN, CDE  Diabetes Coordinator Inpatient Glycemic Control Team Team Pager [phone] (8am-5pm) 05/03/2021 11:29 AM

## 2021-05-03 NOTE — Progress Notes (Signed)
Occupational Therapy Discharge Patient Details Name: Brandon [name] MRN: [id number] DOB: [birth year] Today's Date: 05/03/2021 Time:  -     Patient discharged from OT services secondary to pt is comfort measures only per RN.  Please see latest therapy progress note for current level of functioning and progress toward goals.    Progress and discharge plan discussed with patient and/or caregiver: Patient/Caregiver agrees with plan  GO     Billey Chang, OTR/L  Acute Rehabilitation Services Pager: [phone] Office: [phone] .  05/03/2021, 2:36 PM

## 2021-05-03 NOTE — Progress Notes (Signed)
PT Cancellation Note  Patient Details Name: Brandon [name] MRN: [id number] DOB: 12-10-1946   Cancelled Treatment:    Reason Eval/Treat Not Completed: Active bedrest order   Wyona Almas, PT, DPT Acute Rehabilitation Services Pager [phone] Office [phone]    Deno Etienne 05/03/2021, 8:00 AM

## 2021-05-03 NOTE — Progress Notes (Addendum)
STROKE TEAM PROGRESS NOTE   INTERVAL HISTORY  His daughter and son-in-law are at bedside.  Per his daughter he has had declining health for the past 4 years.  He most recently had progressive weakness requiring assistance with ADLs.  He has was waiting for a bed at an inpatient hospice facility when he developed left sided weakness, left facial droop, and dysarthria.  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  He was given tPA and was admitted to the ICU.  Palliative care was consulted and the decision was made for a comfort approach with transition to a hospice facility.  Vitals:   05/03/21 0600 05/03/21 0700 05/03/21 0800 05/03/21 0849  BP: 133/82 123/88 130/71   Pulse: (!) 130 (!) 131 (!) 130 (!) 101  Resp: 13 16 13 18   Temp:   98.5 F (36.9 C)   TempSrc:   Oral   SpO2: 98% 91% 95% 93%  Weight:      Height:       CBC:  Recent Labs  Lab 05/02/21 1637 05/02/21 1651  WBC 10.3  --   NEUTROABS 8.3*  --   HGB 10.5* 11.6*  HCT 34.8* 34.0*  MCV 85.7  --   PLT 343  --    Basic Metabolic Panel:  Recent Labs  Lab 05/02/21 1637 05/02/21 1651  NA 135 135  K 3.8 3.7  CL 99 99  CO2 23  --   GLUCOSE 201* 198*  BUN 29* 27*  CREATININE 1.49* 1.40*  CALCIUM 8.6*  --    Lipid Panel:  Recent Labs  Lab 05/03/21 0104  CHOL 114  TRIG 49  HDL 58  CHOLHDL 2.0  VLDL 10  LDLCALC 46   HgbA1c:  Recent Labs  Lab 05/03/21 0104  HGBA1C 8.2*   Urine Drug Screen: No results for input(s): LABOPIA, COCAINSCRNUR, LABBENZ, AMPHETMU, THCU, LABBARB in the last 168 hours.  Alcohol Level No results for input(s): ETH in the last 168 hours.  IMAGING past 24 hours CT Angio Head W or Wo Contrast  Result Date: 05/02/2021 CLINICAL DATA:  Left-sided deficit.  Stroke. EXAM: CT ANGIOGRAPHY HEAD AND NECK TECHNIQUE: Multidetector CT imaging of the head and neck was performed using the standard protocol during bolus administration of intravenous contrast.  Multiplanar CT image reconstructions and MIPs were obtained to evaluate the vascular anatomy. Carotid stenosis measurements (when applicable) are obtained utilizing NASCET criteria, using the distal internal carotid diameter as the denominator. CONTRAST:  42mL OMNIPAQUE IOHEXOL 350 MG/ML SOLN COMPARISON:  CT head 05/02/2021.  CT angio head and neck 10/13/2020 FINDINGS: CTA NECK FINDINGS Aortic arch: Atherosclerotic calcification aortic arch and proximal great vessels without significant stenosis. Left vertebral artery origin from the arch. Extensive atherosclerotic disease in the proximal great vessels. Right carotid system: Atherosclerotic calcification throughout the right carotid bifurcation without significant stenosis. Left carotid system: Extensive atherosclerotic calcification left carotid bifurcation without significant stenosis. Atherosclerotic calcification throughout the left common carotid without stenosis. Vertebral arteries: Right vertebral artery dominant. Multiple areas of moderate stenosis in the mid right vertebral artery due to atherosclerotic calcification. Left vertebral artery origin from the arch without significant stenosis throughout its course. Skeleton: Cervical degenerative change. No acute skeletal abnormality. Other neck: Negative Upper chest: Advanced apical emphysema. Small loculated right pleural effusion. Apical scarring bilaterally. Review of the MIP images confirms the above findings CTA HEAD FINDINGS Anterior circulation: Moderate stenosis cavernous carotid bilaterally due to  atherosclerotic calcification. Hypoplastic right A1 segment. Both anterior cerebral arteries supplied from the left and patent. M1 segment patent bilaterally without stenosis. Atherosclerotic irregularity right M2 and M3 branches. There appears to be an occlusion of a small right M2/M3 branch which was patent on the prior study. Atherosclerotic irregularity in left M2 and M3 branches without occlusion.  Posterior circulation: Right vertebral artery dominant with mild atherosclerotic disease distally. Right PICA patent. Left vertebral artery patent to PICA with minimal contribution to the basilar. Basilar patent without stenosis. Superior cerebellar and posterior cerebral arteries patent bilaterally without significant stenosis. Venous sinuses: Normal venous enhancement Anatomic variants: None Review of the MIP images confirms the above findings IMPRESSION: 1. Extensive atherosclerotic disease in the aortic arch and proximal great vessels 2. Atherosclerotic disease in the carotid bifurcation without stenosis bilaterally 3. Moderate stenosis proximal and mid right vertebral artery due to atherosclerotic disease. Left vertebral artery is small but without significant disease. 4. Moderate stenosis in the cavernous carotid bilaterally due to atherosclerotic disease 5. Atherosclerotic disease in the middle cerebral arteries bilaterally. There appears to be a small branch occlusion of the right middle cerebral artery involving right M2/M3 branch. This appeared patent on prior CTA October 2021. 6. Code stroke imaging results were communicated on 05/02/2021 at 5:19 pm to provider Lindzen via text page Electronically Signed   By: Franchot Gallo M.D.   On: 05/02/2021 17:20   CT Angio Neck W and/or Wo Contrast  Result Date: 05/02/2021 CLINICAL DATA:  Left-sided deficit.  Stroke. EXAM: CT ANGIOGRAPHY HEAD AND NECK TECHNIQUE: Multidetector CT imaging of the head and neck was performed using the standard protocol during bolus administration of intravenous contrast. Multiplanar CT image reconstructions and MIPs were obtained to evaluate the vascular anatomy. Carotid stenosis measurements (when applicable) are obtained utilizing NASCET criteria, using the distal internal carotid diameter as the denominator. CONTRAST:  60mL OMNIPAQUE IOHEXOL 350 MG/ML SOLN COMPARISON:  CT head 05/02/2021.  CT angio head and neck 10/13/2020  FINDINGS: CTA NECK FINDINGS Aortic arch: Atherosclerotic calcification aortic arch and proximal great vessels without significant stenosis. Left vertebral artery origin from the arch. Extensive atherosclerotic disease in the proximal great vessels. Right carotid system: Atherosclerotic calcification throughout the right carotid bifurcation without significant stenosis. Left carotid system: Extensive atherosclerotic calcification left carotid bifurcation without significant stenosis. Atherosclerotic calcification throughout the left common carotid without stenosis. Vertebral arteries: Right vertebral artery dominant. Multiple areas of moderate stenosis in the mid right vertebral artery due to atherosclerotic calcification. Left vertebral artery origin from the arch without significant stenosis throughout its course. Skeleton: Cervical degenerative change. No acute skeletal abnormality. Other neck: Negative Upper chest: Advanced apical emphysema. Small loculated right pleural effusion. Apical scarring bilaterally. Review of the MIP images confirms the above findings CTA HEAD FINDINGS Anterior circulation: Moderate stenosis cavernous carotid bilaterally due to atherosclerotic calcification. Hypoplastic right A1 segment. Both anterior cerebral arteries supplied from the left and patent. M1 segment patent bilaterally without stenosis. Atherosclerotic irregularity right M2 and M3 branches. There appears to be an occlusion of a small right M2/M3 branch which was patent on the prior study. Atherosclerotic irregularity in left M2 and M3 branches without occlusion. Posterior circulation: Right vertebral artery dominant with mild atherosclerotic disease distally. Right PICA patent. Left vertebral artery patent to PICA with minimal contribution to the basilar. Basilar patent without stenosis. Superior cerebellar and posterior cerebral arteries patent bilaterally without significant stenosis. Venous sinuses: Normal venous  enhancement Anatomic variants: None Review of the MIP images confirms  the above findings IMPRESSION: 1. Extensive atherosclerotic disease in the aortic arch and proximal great vessels 2. Atherosclerotic disease in the carotid bifurcation without stenosis bilaterally 3. Moderate stenosis proximal and mid right vertebral artery due to atherosclerotic disease. Left vertebral artery is small but without significant disease. 4. Moderate stenosis in the cavernous carotid bilaterally due to atherosclerotic disease 5. Atherosclerotic disease in the middle cerebral arteries bilaterally. There appears to be a small branch occlusion of the right middle cerebral artery involving right M2/M3 branch. This appeared patent on prior CTA October 2021. 6. Code stroke imaging results were communicated on 05/02/2021 at 5:19 pm to provider Lindzen via text page Electronically Signed   By: Franchot Gallo M.D.   On: 05/02/2021 17:20   DG Chest Port 1 View  Result Date: 05/02/2021 CLINICAL DATA:  Respiratory failure EXAM: PORTABLE CHEST 1 VIEW COMPARISON:  04/12/2021, chest CT 01/09/2021 FINDINGS: Emphysematous disease. Patchy bilateral ground-glass opacities. Probable small pleural effusions. Stable cardiomediastinal silhouette. No pneumothorax. IMPRESSION: 1. Cardiomegaly with vascular congestion and patchy bilateral ground-glass opacities which could be secondary to pneumonia to include atypical/viral process versus pulmonary edema. There are probable small pleural effusions. 2. Emphysema Electronically Signed   By: Donavan Foil M.D.   On: 05/02/2021 19:58   CT HEAD CODE STROKE WO CONTRAST  Result Date: 05/02/2021 CLINICAL DATA:  Code stroke. Acute neuro deficit. Left-sided deficit. EXAM: CT HEAD WITHOUT CONTRAST TECHNIQUE: Contiguous axial images were obtained from the base of the skull through the vertex without intravenous contrast. COMPARISON:  MRI head 10/13/2020 FINDINGS: Brain: Generalized atrophy. Mild white matter  hypodensity bilaterally appears chronic. Negative for acute infarct, hemorrhage, mass. Vascular: Negative for hyperdense vessel Skull: Negative Sinuses/Orbits: Mucosal edema and air-fluid level left maxillary sinus with associated bony thickening. Small air-fluid level sphenoid sinus. Right mastoid tip effusion which was also noted in 2021. There is dehiscence of the anterior mastoid tip extending into the external auditory canal. Possible neoplasm or cholesteatoma. No change from CT angio head 10/13/2020 Other: None ASPECTS [facility] Stroke Program Early CT Score) - Ganglionic level infarction (caudate, lentiform nuclei, internal capsule, insula, M1-M3 cortex): 7 - Supraganglionic infarction (M4-M6 cortex): 3 Total score (0-10 with 10 being normal): 10 IMPRESSION: 1. Generalized atrophy.  No acute intracranial abnormality. 2. ASPECTS is 10 3. Right mastoid opacification with bony dehiscence of the anterior wall of mastoid tip extending into the external auditory canal. Possible cholesteatoma or neoplasm. Recommend physical exam of the right external auditory canal. 4. Code stroke imaging results were communicated on 05/02/2021 at 4:56 pm to provider Lindzen via text page Electronically Signed   By: Franchot Gallo M.D.   On: 05/02/2021 16:57    PHYSICAL EXAM Physical Exam  Head: Normocephalic  EENT: Normal external eye and conjunctiva.  Psych: Affect appropriate for situation Cardiovascular: Rapid rate and rhythm on cardiac monitor Lungs: Tachypneic on nonrebreather, no excessive work of breathing.  Abdomen: Rounded, soft Extremities: Warm, dry and intact Musculoskeletal: no joint tenderness, deformity. Bilateral lower extremities with erythema and 2+ nonpitting edema Skin- Warm and dry, scaling erythema of bilateral lower extremities  Neurological Examination Mental Status: Awake, alert and oriented to person, place, time, and situation Speech is with mild dysarthria. Patient is not aphasic. He is  able to provide some history of present illness. Follows all commands. Cranial Nerves: II: PERRL. No field cut.  III,IV, VI: Rightward gaze preference, able to cross midline. Ptosis not present. V,VII: Left facial droop, facial light touch sensation normal bilaterally VIII:  Hearing normal bilaterally IX,X: Palate rises symmetrically XI: Shoulder shrug present on the right, absent on the left XII: Tongue protrudes leftward Motor: Right :  Upper extremity   5/5                                      Left:     Upper extremity  4 /5             Lower extremity   5/5                                                  Lower extremity   4/5 Sensory: Sensation to light touch intact throughout, bilaterally  ASSESSMENT/PLAN Mr. Aubert Choyce is a 75 y.o. male with history of coronary artery disease s/p stenting earlier this year following an NSTEMI, paroxysmal atrial fibrillation not on home anticoagulation, hypertension, peripheral vascular disease, type 2 diabetes mellitus, vitamin B12 deficiency, COPD, chronic hypoxic respiratory failure on home oxygen presenting with acute left sided weakness.  Concern for acute stroke.  S/p tPA.  Not a candidate for thrombectomy due to DNR status.   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 Atrophy. ASPECTS 10.  CTA head & neck: right M2/M3 occlusion  2D Echo (2/22): EF 60-65%, mild LVH, grade 1 diastolic dysfunction  LDL 46  HgbA1c 8.2  VTE prophylaxis - scds  aspirin 81 mg daily prior to admission, now on No antithrombotic. Patient on comfort care  Therapy recommendations:  Not appropriate, patient is comfort measures only  Disposition:  In patient hospice  Hypertension  Home meds:  Zebeta 5mg  daily  Stable . SBP goal <180/105 s/p tPa  Paroxysmal Atrial Fibrillation  Not on anticoagulation at home  Metoprolol prn  HR goal <120  Hyperlipidemia  Home meds:  Atorvastatin 40mg   daily  LDL 46, goal < 70  Diabetes type II Controlled  Home meds:  Jardiance 10mg  daily, aspart 20units bid, metformin 1000mg  bid  HgbA1c 8.2, goal < 7.0  CBGs  SSI  Other Stroke Risk Factors  Advanced Age >/= 39   Cigarette smoker remote quit 6 months ago, 30 pack-year history  Previous ETOH use, alcohol level <10  Hx stroke (October 2021)  Coronary artery disease s/p stent (01/13/2021), NSTEMI  Other Active Problems  PVD   GERD  Malignant neoplasm of prostate  Peripheral vascular disease  Ascending thoracic aortic aneurysm  Chronic respiratory failure with hypoxia  Restless leg syndrome: Pamelor 50mg  daily  Hospital day # 1  Lissy Olivencia-Simmons, ACNP-BC Stroke NP  ATTENDING NOTE: I reviewed above note and agree with the assessment and plan. Pt was seen and examined.   75 year old male with history of CAD/NSTEMI status post stenting, persistent atrial tachycardia, hypertension, PVD, DM, B12 deficiency, chronic respiratory failure on home oxygen, smoker admitted for left-sided weakness, left facial droop and slurred speech.  He received tPA in ED.  CT no acute abnormality.  CTA head and neck showed multifocal intracranial stenosis including right VA, bilateral siphon, bilateral MCA, and right M2/M3 occlusion.  EF 60 to 65% in 01/2021, A1c 8.2, LDL 46.  Hyperglycemia with creatinine 1.4.  Patient had stroke in 09/2020 with left lower extremity weakness and numbness, MRI  showed bilateral ACAs, left MCA/ACA and MCA/PCA infarcts.  CT head and neck no large vessel occlusion.  EF 60 to 65%.  LDL 42 and A1c 6.1.  He was concerning for occult A. fib given persistent atrial tachycardia, recommend outpatient 30-day monitor and the loop recorder.  He was discharged with triple antiplatelet therapy and Lipitor 40.  Since discharge in 09/2020, patient has frequent admission for chronic respite failure, COPD exacerbation and dependence on oxygen at home.  Most of admission  still has persistent atrial tachycardia, however no A. fib found on EKGs.  However, on this admission, EKG showed A. fib with RVR.  Given patient declining in medical condition, patient and family has been working with hospice team and decided residential hospice recently.  Actually, patient was in the process to admit to resident hospice while he was sent to ER for current neurological episode and received tPA.  On exam today, patient lethargic, however open eyes on voice, orientated to time place and age, able to name and repeat, no aphasia, mild dysarthria.  No gaze palsy, visual field full, left facial droop, tongue midline.  Left upper extremity pronator drift.  Right upper extremity at least 4/5.  Bilateral lower extremity proximal 3/5, distal 4/5.  Sensation symmetrical.  Finger-to-nose grossly intact bilaterally.  Palliative care involved and obtain further information from patient and daughter.  On further discussion, patient and family requested for comfort care.  We will transition to full comfort care at this time.  MRI brain canceled.  For detailed assessment and plan, please refer to above as I have made changes wherever appropriate.   Rosalin Hawking, MD PhD Stroke Neurology 05/03/2021 7:20 PM  This patient is critically ill due to acute stroke symptoms status post tPA and at significant risk of neurological worsening, death form acute stroke, bleeding from tPA, respiratory failure, heart failure. This patient's care requires constant monitoring of vital signs, hemodynamics, respiratory and cardiac monitoring, review of multiple databases, neurological assessment, discussion with family, other specialists and medical decision making of high complexity. I spent 35 minutes of neurocritical care time in the care of this patient.   To contact Stroke Continuity provider, please refer to [email or web address] After hours, contact General Neurology

## 2021-05-03 NOTE — Consult Note (Signed)
Consultation Note Date: 05/03/2021   Patient Name: Brandon [name]  DOB: 10-30-1946  MRN: [id number]  Age / Sex: 75 y.o., male  PCP: Pcp, No Referring Physician: Rosalin Hawking, MD  Reason for Consultation: Establishing goals of care  HPI/Patient Profile: 75 y.o. male  with past medical history of chronic hypoxic respiratory failure 2/2 COPD (2-4 lpm at home), CHF, PVD, Venous insuff., IDDM, Prostate cancer, PAF, and GERD admitted on 05/02/2021 with sudden onset of slurred speech/dysarthria and left sided weakness. Patient was actually supposed to be transferred to hospice facility this day however transfer delayed was told to call EMS. CT head neg for bleed. Had new AF w/ RVR. Was given IV TPA and this was completed at [facility]. Patient remains in a fib RVR - rates reaching 140s despite metoprolol. Also with frequent hypoglycemia - 29 this AM. Also requiring 6 L oxygen - desaturates to 70s with any movement or speaking. PMT consulted to discuss [place].   Clinical Assessment and Goals of Care: I have reviewed medical records including EPIC notes, labs and imaging, received report from RN and Dr. Erlinda Hong, assessed the patient and then met with patient and his daughter Brandon [name]  to discuss diagnosis prognosis, [place], EOL wishes, disposition and options.  I introduced Palliative Medicine as specialized medical care for people living with serious illness. It focuses on providing relief from the symptoms and stress of a serious illness. The goal is to improve quality of life for both the patient and the family.  Patient initially tells me his daughter makes all decisions for him and he defers to her. However, patient is oriented to situation so I attempted to understand his goals of care. He tells me that he does not want to be in the hospital anymore, he just wants to be left alone. We discussed shifting to a comfort approach and he agrees this is in line with his goals of care.  We discussed transition to hospice facility and he also agrees to this.  I called and spoke with patient's daughter Brandon [name] - she tells me patient has been slowly declining for 4 years. She tells me his is tired and wants to be allowed to pass in peace. She tells me he is tired of living this way, suffering. She tells me of the decision for DNR status. She tells me she just wants her father to be comfortable, not suffer. We discuss a hospice facility/hospice philosophy of care and she agrees. We discussed poor prognosis: discuss his rapid heart rate and desaturation easily, hypoglycemia - she expresses understanding.   Questions and concerns were addressed. The family was encouraged to call with questions or concerns.   Primary Decision Maker PATIENT joined by daughter    SUMMARY OF RECOMMENDATIONS   - comfort measures only per patient and daughter - medications added to promote comfort - transfer to hospice facility  Code Status/Advance Care Planning:  DNR  Additional Recommendations (Limitations, Scope, Preferences):  Full Comfort Care  Prognosis:   < 2 weeks  Discharge Planning: Hospice facility      Primary Diagnoses: Present on Admission: . Stroke (cerebrum) ([place])   I have reviewed the medical record, interviewed the patient and family, and examined the patient. The following aspects are pertinent.  Past Medical History:  Diagnosis Date  . Abdominal aortic aneurysm without rupture ([place])   . CAD (coronary artery disease)    severe on chest CT   . Chronic alcoholism ([place])   . Chronic idiopathic constipation   .  Chronic pancreatitis ([place])   . Chronic respiratory failure with hypoxia ([place])   . Essential hypertension   . GERD (gastroesophageal reflux disease)   . Hyperaldosteronism ([place])   . Malignant neoplasm of prostate ([place])   . Nicotine dependence   . Paroxysmal atrial tachycardia ([place])   . Peripheral vascular disease ([place])   . Serrated polyp of colon   .  Type 2 diabetes mellitus ([place])    Social History   Socioeconomic History  . Marital status: Divorced    Spouse name: Not on file  . Number of children: Not on file  . Years of education: Not on file  . Highest education level: Not on file  Occupational History  . Not on file  Tobacco Use  . Smoking status: Former Smoker    Packs/day: 0.50    Years: 60.00    Pack years: 30.00    Types: Cigarettes    Quit date: 10/08/2020    Years since quitting: 0.5  . Smokeless tobacco: Never Used  Substance and Sexual Activity  . Alcohol use: Not Currently  . Drug use: Never  . Sexual activity: Not on file  Other Topics Concern  . Not on file  Social History Narrative  . Not on file   Social Determinants of Health   Financial Resource Strain: Not on file  Food Insecurity: Not on file  Transportation Needs: Not on file  Physical Activity: Not on file  Stress: Not on file  Social Connections: Not on file   Family History  Problem Relation Age of Onset  . Hypertension Mother   . Hypertension Father    Scheduled Meds: . metoprolol tartrate  50 mg Oral Q8H  . mometasone-formoterol  2 puff Inhalation BID  . sodium chloride flush  3 mL Intravenous Once   Continuous Infusions: PRN Meds:.acetaminophen **OR** acetaminophen, albuterol, antiseptic oral rinse, LORazepam **OR** LORazepam **OR** LORazepam, metoprolol tartrate, morphine injection, polyvinyl alcohol Allergies  Allergen Reactions  . Hydrochlorothiazide Other (See Comments)    Hypokalemia (Pt claims not allergic 04/09/21)   Review of Systems  Constitutional: Positive for activity change.  Respiratory: Positive for shortness of breath.     Physical Exam Cardiovascular:     Rate and Rhythm: Tachycardia present. Rhythm irregular.  Pulmonary:     Comments: Slight increased work of breathing, increased RR Skin:    General: Skin is warm and dry.  Neurological:     Mental Status: He is alert and oriented to person, place,  and time.  Psychiatric:        Behavior: Behavior is withdrawn.     Vital Signs: BP 117/90   Pulse (!) 128   Temp 98.5 F (36.9 C) (Oral)   Resp 15   Ht 6' (1.829 m)   Wt 76.6 kg   SpO2 100%   BMI 22.90 kg/m  Pain Scale: 0-10   Pain Score: 0-No pain   SpO2: SpO2: 100 % O2 Device:SpO2: 100 % O2 Flow Rate: .O2 Flow Rate (L/min): 5 L/min  IO: Intake/output summary:   Intake/Output Summary (Last 24 hours) at 05/03/2021 1611 Last data filed at 05/03/2021 1300 Gross per 24 hour  Intake 1104.33 ml  Output 1300 ml  Net -195.67 ml    LBM: Last BM Date:  (PTA) Baseline Weight: Weight: 76.6 kg Most recent weight: Weight: 76.6 kg     Palliative Assessment/Data: PPS 30%    Time Total: 55 minutes Greater than 50%  of this time was spent counseling and coordinating  care related to the above assessment and plan.  Juel Burrow, DNP, AGNP-C Palliative Medicine Team [phone] Pager: [phone]

## 2021-05-03 NOTE — Progress Notes (Signed)
[facility] Progress Note Patient Name: Brandon [name] DOB: [birth year] MRN: [id number]   Date of Service  05/03/2021  HPI/Events of Note  Patient needs medication for nausea.  eICU Interventions  Phenergan 6.25 mg iv x 1 ordered.        Kerry Kass [facility] 05/03/2021, 10:16 PM

## 2021-05-03 NOTE — TOC Initial Note (Addendum)
Transition of Care [facility]) - Initial/Assessment Note    Patient Details  Name: Brandon [name] MRN: [id number] Date of Birth: 25-May-1946  [facility]) CM/SW Contact:    Benard Halsted, LCSW Phone Number: 05/03/2021, 12:26 PM  Clinical Narrative:                 12:26pm-CSW met with patient's daughter and son in law at his bedside. Daughter reported being upset because patient was due to [facility] to the [facility] right before he was sent to the hospital. She stated they were supposed to pick him up and forgot to arrange the transport. But then he had stroke symptoms when his son in law was going to take him by car. Hospice told her to call EMS and let them check him out and if he was having a stroke to take him to the hospital. Patient's daughter stated hospice came to the hospital and had family sign a revocation form since he was hospitalized. She is requesting patient be comfort and not in pain. She is in agreement to any hospice location that is available as she wants him to be comfortable. She lives in [place] but understands we are unable to transport him that far without paying privately. She showed CSW the revocation form, which stated an address of [address] is in [place] (not a [facility]).    CSW contacted [facility] to see if he was active with them. They said no. CSW contacted [facility] to see if they were aware of the patient. They reported the patient was scheduled to come to them for 5 days of respite care, paid for by [facility], as patient does not have Medicaid. MD obtaining palliative consult to see if patient meets residential hospice criteria as daughter is requesting.  4pm-CSW received consult from Palliative care for residential hospice placement. CSW contacted [facility] and they will review referral.        Expected Discharge Plan: [place] Barriers to Discharge: Continued Medical Work up   Patient  Goals and CMS Choice Patient states their goals for this hospitalization and ongoing recovery are:: Comfort CMS Medicare.gov Compare Post Acute Care list provided to:: Patient Represenative (must comment) Choice offered to / list presented to : Adult Children  Expected Discharge Plan and Services Expected Discharge Plan: [place] In-house Referral: Clinical Social Work,Hospice / [place] Acute Care Choice: Hospice Living arrangements for the past 2 months: Single Family Home                                      Prior Living Arrangements/Services Living arrangements for the past 2 months: Single Family Home Lives with:: Self Patient language and need for interpreter reviewed:: Yes Do you feel safe going back to the place where you live?: Yes      Need for Family Participation in Patient Care: Yes (Comment) Care giver support system in place?: Yes (comment) Current home services: DME Criminal Activity/Legal Involvement Pertinent to Current Situation/Hospitalization: No - Comment as needed  Activities of Daily Living Home Assistive Devices/Equipment: Oxygen,Cane (specify quad or straight),Walker (specify type) ADL Screening (condition at time of admission) Patient's cognitive ability adequate to safely complete daily activities?: Yes Is the patient deaf or have difficulty hearing?: Yes Does the patient have difficulty seeing, even when wearing glasses/contacts?: Yes  Does the patient have difficulty concentrating, remembering, or making decisions?: No Patient able to express need for assistance with ADLs?: Yes Does the patient have difficulty dressing or bathing?: Yes Independently performs ADLs?: No Communication: Independent Dressing (OT): Needs assistance Is this a change from baseline?: Pre-admission baseline Grooming: Needs assistance Is this a change from baseline?: Pre-admission baseline Feeding: Independent Bathing: Needs  assistance Is this a change from baseline?: Pre-admission baseline Toileting: Independent In/Out Bed: Independent Walks in Home: Independent Does the patient have difficulty walking or climbing stairs?: Yes Weakness of Legs: Both Weakness of Arms/Hands: Left  Permission Sought/Granted Permission sought to share information with : Facility Art therapist granted to share information with : No  Share Information with NAME: Mardene Celeste  Permission granted to share info w AGENCY: Hospice  Permission granted to share info w Relationship: Daughter  Permission granted to share info w Contact Information: [phone]  Emotional Assessment Appearance:: Appears stated age Attitude/Demeanor/Rapport: Unable to Assess Affect (typically observed): Unable to Assess Orientation: :  (On breathing mask; did not arouse) Alcohol / Substance Use: Not Applicable Psych Involvement: No (comment)  Admission diagnosis:  Respiratory failure ([place]) [J96.90] Stroke (cerebrum) ([place]) [I63.9] Cerebrovascular accident (CVA), unspecified mechanism ([place]) [I63.9] Patient Active Problem List   Diagnosis Date Noted  . Stroke (cerebrum) ([place]) 05/02/2021  . Atrial fibrillation with rapid ventricular response ([place])   . COPD (chronic obstructive pulmonary disease) ([place]) 04/09/2021  . Acute on chronic congestive heart failure ([place])   . Palliative care by specialist   . Advance care planning   . Prolonged QT interval 03/13/2021  . Protein-calorie malnutrition, severe 02/21/2021  . Acute on chronic respiratory failure with hypoxia ([place]) 02/19/2021  . Normocytic anemia 02/19/2021  . (HFpEF) heart failure with preserved ejection fraction ([place]) 02/01/2021  . Pressure injury of skin 01/09/2021  . Malnutrition of moderate degree 01/09/2021  . Elevated troponin 01/07/2021  . COPD with acute exacerbation ([place]) 01/07/2021  . History of CVA (cerebrovascular accident) 01/07/2021  . COPD exacerbation ([place])  01/07/2021  . Hypomagnesemia 01/07/2021  . Essential hypertension   . Type 2 diabetes mellitus ([place])   . CAD (coronary artery disease)   . GERD (gastroesophageal reflux disease)   . Acute on chronic respiratory failure with hypoxemia ([place])   . COPD without exacerbation ([place])   . Tobacco abuse   . Hyponatremia   . Hyperkalemia   . Ischemic stroke [facility])    PCP:  Pcp, No Pharmacy:   [facility], [place] - [address] [place] [address] Phone: [phone] Fax: [phone]     Social Determinants of Health (SDOH) Interventions    Readmission Risk Interventions Readmission Risk Prevention Plan 05/03/2021 04/14/2021 02/24/2021  Transportation Screening Complete Complete Complete  PCP or Specialist Appt within 3-5 Days - - -  HRI or [place] for [place] - - -  Medication Review (RN Care Manager) Complete - Complete  PCP or Specialist appointment within 3-5 days of discharge Complete Not Complete (No Data)  PCP/Specialist Appt Not Complete comments - Unable top reach provider.  Daughter aware and will follow up. -  HRI or Home Care Consult Complete Complete Complete  SW Recovery Care/Counseling Consult Complete Complete Complete  Palliative Care Screening Complete Not Applicable Not Applicable  Skilled Nursing Facility Complete Not Applicable Not Applicable  Some recent data might be hidden

## 2021-05-03 NOTE — Progress Notes (Signed)
NAME:  Brandon [name], MRN:  [id number], DOB:  1946/03/12, LOS: 1 ADMISSION DATE:  05/02/2021, CONSULTATION DATE:  5/3 REFERRING MD: Cheral Marker , CHIEF COMPLAINT:  Acute stroke   History of Present Illness:  This is a 41 yom w/ hx as per below. Was actually to be admitted to hospice as of 5/3 w/ slow and progressive respiratory failure and functional decline. Then on afternoon of 5/3 at 1530 developed sudden onset of slurred speech/dysarthria and left sided weakness. EMS called Code stroke initiated. CT head neg for bleed. Had new AF w/ RVR. Was given IV TPA and this was completed at [facility]. PCCM asked to eval for assistance with medical therapy.   Pertinent  Medical History  Chronic hypoxic respiratory failure 2/2 COPD (2-4 lpm at home), was actually to enter into hospice via the [place] system 5/3. CHF PVD Venous insuff.  IDDM Medical non-compliance  Prostate cancer  PAF GERD  Significant Hospital Events: Including procedures, antibiotic start and stop dates in addition to other pertinent events   . 5/3 acute CVA w/ left sided weakness and dysarthria. CT angiogram w/ concern for occlusion of small right M2/M3 branchs. Got IV TPA which completed at 1758. PCCm asked to assist w/ medical care. DNR status confirmed  . 5/4 patient remained in A. fib with RVR, passed swallow evaluation   Interim History / Subjective:  Patient remained in A. fib with RVR, heart rate reaching to 140s despite IV metoprolol. frequently going into hypoglycemia, received D50 x2  Objective   Blood pressure 130/71, pulse (!) 101, temperature 98.5 F (36.9 C), temperature source Oral, resp. rate 18, height 6' (1.829 m), weight 76.6 kg, SpO2 93 %.    FiO2 (%):  [36 %] 36 %   Intake/Output Summary (Last 24 hours) at 05/03/2021 1023 Last data filed at 05/03/2021 0700 Gross per 24 hour  Intake 414.45 ml  Output --  Net 414.45 ml   Filed Weights   05/02/21 1600 05/02/21 1754  Weight: 76.6 kg 76.6 kg     Examination: General: Chronically ill-appearing elderly male, lying in the bed HENT: Atraumatic, normocephalic, sclera anicteric, no JVD Lungs: Reduced air entry at the bases, clear otherwise, no wheezes or rhonchi  Card: Irregularly irregular, tachycardic abd soft not tender + bowel sounds Ext chronic LE edema pitting 4+. Weak pulses. Chronic venous stasis changes.  Neuro awake. Follows commands.  Dysarthric, left upper extremity drift, antigravity otherwise Skin: Chronic changes in lower extremities   Labs/imaging that I havepersonally reviewed  (right click and "Reselect all SmartList Selections" daily)  Serum creatinine 1.4, serum potassium 3.7  Resolved Hospital Problem list     Assessment & Plan:   Acute right MCA stroke. S/p IV TPA Continue neuro watch per protocol post tPA Holding antiplatelets for now Continue atorvastatin Continue secondary stroke prophylaxis SBP goal < 180 CT head and MRI brain pending Passed speech and swallow evaluation, started on heart healthy diet  Acute on Chronic hypoxic respiratory failure in setting of advanced COPD At baseline patient uses 2 L of oxygen via nasal cannula Currently on 5 L frequently his oxygen saturation drops down to low 80s Continue to titrate oxygen with O2 sat goal 88-92% Scheduled BDs and ICS he is typically on [place]   Paroxysmal A. fib with RVR Patient's heart rate is not well controlled still reaching to 140s Started on oral Lopressor 50 mg every 8 Continue IV Lopressor 5 mg every 6 hours as needed for heart rate more than 120  Patient needs to be anticoagulated for stroke prophylaxis based on high CHA2DS2-VASc score, anticoagulation is on hold for post tPA  Chronic diastolic HF, CAD, HL,  and PAD as well as chronic venous disease.  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 Closely monitor serum creatinine and electrolytes  Chronic anemia w/out evidence of bleeding Monitor H&H  Best practice (right click and "Reselect all SmartList Selections" daily)  Diet: Diabetic diet Pain/Anxiety/Delirium protocol (if indicated): No VAP protocol (if indicated): Not indicated DVT prophylaxis: SCD GI prophylaxis: PPI Glucose control:  SSI Yes Central venous access:  N/A Arterial line:  N/A Foley:  N/A Mobility:  OOB  PT consulted: Yes Last date of multidisciplinary goals of care discussion [per primary ] Code Status:  DNR Disposition: to ICU   Labs   CBC: Recent Labs  Lab 05/02/21 1637 05/02/21 1651  WBC 10.3  --   NEUTROABS 8.3*  --   HGB 10.5* 11.6*  HCT 34.8* 34.0*  MCV 85.7  --   PLT 343  --     Basic Metabolic Panel: Recent Labs  Lab 05/02/21 1637 05/02/21 1651  NA 135 135  K 3.8 3.7  CL 99 99  CO2 23  --   GLUCOSE 201* 198*  BUN 29* 27*  CREATININE 1.49* 1.40*  CALCIUM 8.6*  --    GFR: Estimated Creatinine Clearance: 49.4 mL/min (A) (by C-G formula based on SCr of 1.4 mg/dL (H)). Recent Labs  Lab 05/02/21 1637  WBC 10.3    Liver Function Tests: Recent Labs  Lab 05/02/21 1637  AST 20  ALT 17  ALKPHOS 61  BILITOT 0.8  PROT 6.1*  ALBUMIN 3.2*   No results for input(s): LIPASE, AMYLASE in the last 168 hours. No results for input(s): AMMONIA in the last 168 hours.  ABG    Component Value Date/Time   PHART 7.443 04/12/2021 0437   PCO2ART 40.1 04/12/2021 0437   PO2ART 59.1 (L) 04/12/2021 0437   HCO3 27.1 04/12/2021 0437   TCO2 23 05/02/2021 1651   ACIDBASEDEF 2.1 (H) 02/19/2021 1926   O2SAT 88.6 04/12/2021 0437     Coagulation Profile: Recent Labs  Lab 05/02/21 1637  INR 1.1    Cardiac Enzymes: No results for input(s): CKTOTAL, CKMB, CKMBINDEX, TROPONINI in the last 168  hours.  HbA1C: Hgb A1c MFr Bld  Date/Time Value Ref Range Status  05/03/2021 01:04 AM 8.2 (H) 4.8 - 5.6 % Final    Comment:    (NOTE) Pre diabetes:          5.7%-6.4%  Diabetes:              >6.4%  Glycemic control for   <7.0% adults with diabetes   03/15/2021 02:58 AM 6.9 (H) 4.8 - 5.6 % Final    Comment:    (NOTE) Pre diabetes:          5.7%-6.4%  Diabetes:              >6.4%  Glycemic control for   <7.0% adults with diabetes     CBG: Recent Labs  Lab 05/03/21 0446 05/03/21 0819 05/03/21 0821 05/03/21 0847 05/03/21 0852  GLUCAP 84 29* 74 68* 52*       Total critical care time: 34 minutes  Performed by: Jacky Kindle   Critical care time was exclusive of separately billable procedures and treating other patients.   Critical care was necessary to treat or prevent imminent or life-threatening deterioration.   Critical care was time spent personally by me on the following activities: development of treatment plan with patient and/or surrogate as well as nursing, discussions with consultants, evaluation of patient's response to treatment, examination of patient, obtaining history from patient or surrogate, ordering and performing treatments and interventions, ordering and review of laboratory studies, ordering and review of radiographic studies, pulse oximetry and re-evaluation of patient's condition.   Jacky Kindle MD [facility] See Amion for pager If no response to pager, please call [phone] until 7pm After 7pm, Please call E-link [phone]

## 2021-05-03 NOTE — Progress Notes (Signed)
OT Cancellation Note  Patient Details Name: Brandon [name] MRN: [id number] DOB: [birth year]   Cancelled Treatment:    Reason Eval/Treat Not Completed: Active bedrest order OT order received and appreciated however this conflicts with current bedrest order set. Please increase activity tolerance as appropriate and remove bedrest from orders. . Please contact OT at [phone] if bed rest order is discontinued. OT will hold evaluation at this time and will check back as time allows pending increased activity orders.   Billey Chang, OTR/L  Acute Rehabilitation Services Pager: [phone] Office: [phone] .  05/03/2021, 8:13 AM

## 2021-05-04 DIAGNOSIS — Z515 Encounter for palliative care: Secondary | ICD-10-CM

## 2021-05-04 DIAGNOSIS — C61 Malignant neoplasm of prostate: Secondary | ICD-10-CM | POA: Diagnosis not present

## 2021-05-04 DIAGNOSIS — E1159 Type 2 diabetes mellitus with other circulatory complications: Secondary | ICD-10-CM

## 2021-05-04 DIAGNOSIS — Z7189 Other specified counseling: Secondary | ICD-10-CM

## 2021-05-04 DIAGNOSIS — I5033 Acute on chronic diastolic (congestive) heart failure: Secondary | ICD-10-CM

## 2021-05-04 DIAGNOSIS — I739 Peripheral vascular disease, unspecified: Secondary | ICD-10-CM | POA: Diagnosis not present

## 2021-05-04 DIAGNOSIS — E785 Hyperlipidemia, unspecified: Secondary | ICD-10-CM | POA: Diagnosis not present

## 2021-05-04 DIAGNOSIS — Z66 Do not resuscitate: Secondary | ICD-10-CM

## 2021-05-04 DIAGNOSIS — I10 Essential (primary) hypertension: Secondary | ICD-10-CM

## 2021-05-04 DIAGNOSIS — Z8673 Personal history of transient ischemic attack (TIA), and cerebral infarction without residual deficits: Secondary | ICD-10-CM

## 2021-05-04 DIAGNOSIS — Z794 Long term (current) use of insulin: Secondary | ICD-10-CM

## 2021-05-04 DIAGNOSIS — Z720 Tobacco use: Secondary | ICD-10-CM

## 2021-05-04 LAB — GLUCOSE, CAPILLARY: Glucose-Capillary: 29 mg/dL — CL (ref 70–99)

## 2021-05-04 MED ORDER — ACETAMINOPHEN 325 MG PO TABS: 650.0000 mg | ORAL_TABLET | Freq: Four times a day (QID) | ORAL | Status: AC | PRN

## 2021-05-04 MED ORDER — ONDANSETRON HCL 4 MG/2ML IJ SOLN
4.0000 mg | Freq: Four times a day (QID) | INTRAMUSCULAR | Status: DC | PRN
  Administered 2021-05-04: 4 mg via INTRAVENOUS
  Filled 2021-05-04: qty 2

## 2021-05-04 MED ORDER — GLYCOPYRROLATE 0.2 MG/ML IJ SOLN: 0.2000 mg | INTRAMUSCULAR | Status: DC | PRN

## 2021-05-04 MED ORDER — GLYCOPYRROLATE 1 MG PO TABS: 1.0000 mg | ORAL_TABLET | ORAL | Status: AC | PRN

## 2021-05-04 MED ORDER — GLYCOPYRROLATE 1 MG PO TABS
1.0000 mg | ORAL_TABLET | ORAL | Status: DC | PRN
  Filled 2021-05-04: qty 1

## 2021-05-04 NOTE — Progress Notes (Signed)
Daily Progress Note   Patient Name: Brandon [name]       Date: 05/04/2021 DOB: 07/06/1946  Age: 75 y.o. MRN#: [id number] Attending Physician: Rosalin Hawking, MD Primary Care Physician: Pcp, No Admit Date: 05/02/2021  Reason for Consultation/Follow-up: Hospice Evaluation and Terminal Care  Subjective: Sleeping, does not wake to voice Per  RN was c/o nausea earlier - only ate a few bites of breakfast Receiving PRN morphine  Length of Stay: 2  Current Medications: Scheduled Meds:    Continuous Infusions:   PRN Meds: acetaminophen **OR** acetaminophen, antiseptic oral rinse, glycopyrrolate **OR** glycopyrrolate **OR** glycopyrrolate, LORazepam **OR** LORazepam **OR** LORazepam, morphine injection, ondansetron (ZOFRAN) IV, polyvinyl alcohol  Physical Exam Constitutional:      Comments: Sleeping does not wake to voice  Pulmonary:     Effort: Pulmonary effort is normal.  Skin:    General: Skin is warm and dry.             Vital Signs: BP 111/85 (BP Location: Right Arm)   Pulse (!) 135   Temp 98.3 F (36.8 C) (Oral)   Resp (!) 21   Ht 6' (1.829 m)   Wt 76.6 kg   SpO2 (!) 79%   BMI 22.90 kg/m  SpO2: SpO2: (!) 79 % O2 Device: O2 Device: Nasal Cannula O2 Flow Rate: O2 Flow Rate (L/min): 5 L/min  Intake/output summary:   Intake/Output Summary (Last 24 hours) at 05/04/2021 1548 Last data filed at 05/04/2021 1300 Gross per 24 hour  Intake 340.02 ml  Output 2250 ml  Net -1909.98 ml   LBM: Last BM Date:  (pta) Baseline Weight: Weight: 76.6 kg Most recent weight: Weight: 76.6 kg       Palliative Assessment/Data: PPS 20%    Flowsheet Rows   Flowsheet Row Most Recent Value  Intake Tab   Referral Department Neurology  Unit at Time of Referral ICU  Palliative Care Primary Diagnosis  Neurology  Date Notified 05/03/21  Palliative Care Type Return patient Palliative Care  Reason for referral Clarify Goals of Care  Date of Admission 05/02/21  Date first seen by Palliative Care 05/03/21  # of days Palliative referral response time 0 Day(s)  # of days IP prior to Palliative referral 1  Clinical Assessment   Palliative Performance Scale Score 30%  Psychosocial & Spiritual Assessment   Palliative Care Outcomes   Patient/Family meeting held? Yes  Who was at the meeting? daughter and patient  Palliative Care Outcomes Clarified goals of care, Counseled regarding hospice, Provided end of life care assistance, Provided psychosocial or spiritual support, Changed to focus on comfort      Patient Active Problem List   Diagnosis Date Noted  . Hyperlipidemia 05/04/2021  . PVD (peripheral vascular disease) ([place]) 05/04/2021  . Prostate cancer ([place]) 05/04/2021  . Stroke (cerebrum) ([place]) 05/02/2021  . Atrial fibrillation with rapid ventricular response ([place])   . COPD (chronic obstructive pulmonary disease) ([place]) 04/09/2021  . Acute on chronic congestive heart failure ([place])   . Palliative care by specialist   . Advance care planning   . Prolonged QT interval 03/13/2021  . Protein-calorie malnutrition, severe 02/21/2021  . Acute on chronic respiratory failure with hypoxia ([place]) 02/19/2021  .  Normocytic anemia 02/19/2021  . (HFpEF) heart failure with preserved ejection fraction ([place]) 02/01/2021  . Pressure injury of skin 01/09/2021  . Malnutrition of moderate degree 01/09/2021  . Elevated troponin 01/07/2021  . COPD with acute exacerbation ([place]) 01/07/2021  . History of CVA (cerebrovascular accident) 01/07/2021  . COPD exacerbation ([place]) 01/07/2021  . Hypomagnesemia 01/07/2021  . Essential hypertension   . Type 2 diabetes mellitus ([place])   . CAD (coronary artery disease)   . GERD (gastroesophageal reflux disease)   . Acute on chronic respiratory failure with hypoxemia ([place])    . COPD without exacerbation ([place])   . Tobacco abuse   . Hyponatremia   . Hyperkalemia   . 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 male  with past medical history of chronic hypoxic respiratory failure 2/2 COPD (2-4 lpm at home), CHF, PVD, Venous insuff., IDDM, Prostate cancer, PAF, and GERD admitted on 05/02/2021 with sudden onset of slurred speech/dysarthria and left sided weakness. Patient was actually supposed to be transferred to hospice facility this day however transfer delayed was told to call EMS. CT head neg for bleed. Had new AF w/ RVR. Was given IV TPA and this was completed at [place]. Patient remains in a fib RVR - rates reaching 140s despite metoprolol. Also with frequent hypoglycemia - 29 this AM. Also requiring 6 L oxygen - desaturates to 70s with any movement or speaking. PMT consulted to discuss [place].  Assessment: Patient is resting - appears comfortable. Breathing unlabored. Has been accepted at hospice facility with plans to dc today. C/o nausea earlier - phenergan ordered by other provider Has also been receiving PRN morphine  Recommendations/Plan: Dc to hospice facility today Continue comfort measures  Goals of Care and Additional Recommendations: Limitations on Scope of Treatment: Full Comfort Care  Code Status: DNR  Prognosis:  < 2 weeks  Discharge Planning: Hospice facility  Care plan was discussed with RN  Thank you for allowing the Palliative Medicine Team to assist in the care of this patient.   Total Time 15 minutes Prolonged Time Billed  no       Greater than 50%  of this time was spent counseling and coordinating care related to the above assessment and plan.  Juel Burrow, DNP, [facility] Palliative Medicine Team Team Phone # [phone]  Pager [phone]

## 2021-05-04 NOTE — Discharge Summary (Addendum)
Stroke Discharge Summary  Patient ID: Caydon Feasel   MRN: [id number]      DOB: 1946-03-06  Date of Admission: 05/02/2021 Date of Discharge: 05/04/2021  Attending Physician:  Rosalin Hawking, MD, Stroke MD Consultant(s):    palliative care, PCCM  Patient's PCP:  Pcp, No  DISCHARGE DIAGNOSIS:  Principal Problem:   Ischemic stroke ([place]) R MCA infarc, embolic d/t AF w/ R X8/B3 occlusion s/p tPA Active Problems:   Essential hypertension   Type 2 diabetes mellitus ([place])   CAD (coronary artery disease)   GERD (gastroesophageal reflux disease)   Tobacco abuse   History of CVA (cerebrovascular accident)   Malnutrition of moderate degree   (HFpEF) heart failure with preserved ejection fraction (HCC)   Acute on chronic respiratory failure with hypoxia ([place])   Palliative care by specialist   COPD (chronic obstructive pulmonary disease) ([place])   Stroke (cerebrum) (HCC)   Atrial fibrillation with rapid ventricular response (HCC)   Hyperlipidemia   PVD (peripheral vascular disease) ([place])   Prostate cancer ([place])   Allergies as of 05/04/2021      Reactions   Hydrochlorothiazide Other (See Comments)   Hypokalemia (Pt claims not allergic 04/09/21)      Medication List    STOP taking these medications   albuterol 108 (90 Base) MCG/ACT inhaler Commonly known as: VENTOLIN HFA   aspirin 81 MG EC tablet   atorvastatin 40 MG tablet Commonly known as: LIPITOR   bisoprolol 5 MG tablet Commonly known as: ZEBETA   clopidogrel 75 MG tablet Commonly known as: PLAVIX   diltiazem 240 MG 24 hr capsule Commonly known as: CARDIZEM CD   empagliflozin 10 MG Tabs tablet Commonly known as: Jardiance   feeding supplement (GLUCERNA SHAKE) Liqd   Fluticasone-Salmeterol 250-50 MCG/DOSE Aepb Commonly known as: ADVAIR   furosemide 40 MG tablet Commonly known as: LASIX   insulin aspart protamine - aspart (70-30) 100 UNIT/ML FlexPen Commonly known as: NOVOLOG 70/30 MIX   ipratropium-albuterol  0.5-2.5 (3) MG/3ML Soln Commonly known as: DUONEB   Jardiance 10 MG Tabs tablet Generic drug: empagliflozin   Magnesium Oxide 420 MG Tabs   metFORMIN 1000 MG tablet Commonly known as: GLUCOPHAGE   multivitamin with minerals Tabs tablet   nortriptyline 50 MG capsule Commonly known as: PAMELOR   Omega-3 1000 MG Caps   OXYGEN   pantoprazole 40 MG tablet Commonly known as: PROTONIX   polyethylene glycol 17 g packet Commonly known as: MIRALAX / GLYCOLAX   potassium chloride 10 MEQ tablet Commonly known as: KLOR-CON   potassium chloride 10 MEQ tablet Commonly known as: KLOR-CON   predniSONE 10 MG tablet Commonly known as: DELTASONE   senna-docusate 8.6-50 MG tablet Commonly known as: Senokot-S   Tiotropium Bromide Monohydrate 2.5 MCG/ACT Aers     TAKE these medications   acetaminophen 325 MG tablet Commonly known as: TYLENOL Take 2 tablets (650 mg total) by mouth every 6 (six) hours as needed for mild pain (or Fever >/= 101).   glycopyrrolate 1 MG tablet Commonly known as: ROBINUL Take 1 tablet (1 mg total) by mouth every 4 (four) hours as needed (excessive secretions).   LORazepam 0.5 MG tablet Commonly known as: ATIVAN Take 0.5 mg by mouth every 6 (six) hours as needed for anxiety.   morphine CONCENTRATE 10 mg / 0.5 ml concentrated solution Take 0.25 mLs by mouth every 4 (four) hours as needed. Shortness of breath       LABORATORY STUDIES CBC  Component Value Date/Time   WBC 10.3 05/02/2021 1637   RBC 4.06 (L) 05/02/2021 1637   HGB 11.6 (L) 05/02/2021 1651   HCT 34.0 (L) 05/02/2021 1651   PLT 343 05/02/2021 1637   MCV 85.7 05/02/2021 1637   MCH 25.9 (L) 05/02/2021 1637   MCHC 30.2 05/02/2021 1637   RDW 20.6 (H) 05/02/2021 1637   LYMPHSABS 1.2 05/02/2021 1637   MONOABS 0.8 05/02/2021 1637   EOSABS 0.0 05/02/2021 1637   BASOSABS 0.0 05/02/2021 1637   CMP    Component Value Date/Time   NA 135 05/02/2021 1651   K 3.7 05/02/2021 1651   CL 99  05/02/2021 1651   CO2 23 05/02/2021 1637   GLUCOSE 198 (H) 05/02/2021 1651   BUN 27 (H) 05/02/2021 1651   CREATININE 1.40 (H) 05/02/2021 1651   CALCIUM 8.6 (L) 05/02/2021 1637   PROT 6.1 (L) 05/02/2021 1637   ALBUMIN 3.2 (L) 05/02/2021 1637   AST 20 05/02/2021 1637   ALT 17 05/02/2021 1637   ALKPHOS 61 05/02/2021 1637   BILITOT 0.8 05/02/2021 1637   GFRNONAA 49 (L) 05/02/2021 1637   GFRAA  07/21/2008 1915    >60        The eGFR has been calculated using the MDRD equation. This calculation has not been validated in all clinical   COAGS Lab Results  Component Value Date   INR 1.1 05/02/2021   INR 1.1 07/21/2008   Lipid Panel    Component Value Date/Time   CHOL 114 05/03/2021 0104   TRIG 49 05/03/2021 0104   HDL 58 05/03/2021 0104   CHOLHDL 2.0 05/03/2021 0104   VLDL 10 05/03/2021 0104   LDLCALC 46 05/03/2021 0104   HgbA1C  Lab Results  Component Value Date   HGBA1C 8.2 (H) 05/03/2021   Urinalysis    Component Value Date/Time   COLORURINE YELLOW 01/07/2021 2010   APPEARANCEUR CLEAR 01/07/2021 2010   LABSPEC 1.006 01/07/2021 2010   PHURINE 5.0 01/07/2021 2010   GLUCOSEU NEGATIVE 01/07/2021 2010   HGBUR NEGATIVE 01/07/2021 2010   [place] NEGATIVE 01/07/2021 2010   [place] NEGATIVE 01/07/2021 2010   PROTEINUR NEGATIVE 01/07/2021 2010   NITRITE NEGATIVE 01/07/2021 2010   LEUKOCYTESUR NEGATIVE 01/07/2021 2010   Alcohol Level    Component Value Date/Time   ETH <10 02/01/2021 1847    SIGNIFICANT DIAGNOSTIC STUDIES CT Angio Head W or Wo Contrast  Result Date: 05/02/2021 CLINICAL DATA:  Left-sided deficit.  Stroke. EXAM: CT ANGIOGRAPHY HEAD AND NECK TECHNIQUE: Multidetector CT imaging of the head and neck was performed using the standard protocol during bolus administration of intravenous contrast. Multiplanar CT image reconstructions and MIPs were obtained to evaluate the vascular anatomy. Carotid stenosis measurements (when applicable) are obtained  utilizing NASCET criteria, using the distal internal carotid diameter as the denominator. CONTRAST:  79m OMNIPAQUE IOHEXOL 350 MG/ML SOLN COMPARISON:  CT head 05/02/2021.  CT angio head and neck 10/13/2020 FINDINGS: CTA NECK FINDINGS Aortic arch: Atherosclerotic calcification aortic arch and proximal great vessels without significant stenosis. Left vertebral artery origin from the arch. Extensive atherosclerotic disease in the proximal great vessels. Right carotid system: Atherosclerotic calcification throughout the right carotid bifurcation without significant stenosis. Left carotid system: Extensive atherosclerotic calcification left carotid bifurcation without significant stenosis. Atherosclerotic calcification throughout the left common carotid without stenosis. Vertebral arteries: Right vertebral artery dominant. Multiple areas of moderate stenosis in the mid right vertebral artery due to atherosclerotic calcification. Left vertebral artery origin from the arch without significant  stenosis throughout its course. Skeleton: Cervical degenerative change. No acute skeletal abnormality. Other neck: Negative Upper chest: Advanced apical emphysema. Small loculated right pleural effusion. Apical scarring bilaterally. Review of the MIP images confirms the above findings CTA HEAD FINDINGS Anterior circulation: Moderate stenosis cavernous carotid bilaterally due to atherosclerotic calcification. Hypoplastic right A1 segment. Both anterior cerebral arteries supplied from the left and patent. M1 segment patent bilaterally without stenosis. Atherosclerotic irregularity right M2 and M3 branches. There appears to be an occlusion of a small right M2/M3 branch which was patent on the prior study. Atherosclerotic irregularity in left M2 and M3 branches without occlusion. Posterior circulation: Right vertebral artery dominant with mild atherosclerotic disease distally. Right PICA patent. Left vertebral artery patent to PICA with  minimal contribution to the basilar. Basilar patent without stenosis. Superior cerebellar and posterior cerebral arteries patent bilaterally without significant stenosis. Venous sinuses: Normal venous enhancement Anatomic variants: None Review of the MIP images confirms the above findings IMPRESSION: 1. Extensive atherosclerotic disease in the aortic arch and proximal great vessels 2. Atherosclerotic disease in the carotid bifurcation without stenosis bilaterally 3. Moderate stenosis proximal and mid right vertebral artery due to atherosclerotic disease. Left vertebral artery is small but without significant disease. 4. Moderate stenosis in the cavernous carotid bilaterally due to atherosclerotic disease 5. Atherosclerotic disease in the middle cerebral arteries bilaterally. There appears to be a small branch occlusion of the right middle cerebral artery involving right M2/M3 branch. This appeared patent on prior CTA October 2021. 6. Code stroke imaging results were communicated on 05/02/2021 at 5:19 pm to provider Lindzen via text page Electronically Signed   By: Franchot Gallo M.D.   On: 05/02/2021 17:20   CT Angio Neck W and/or Wo Contrast  Result Date: 05/02/2021 CLINICAL DATA:  Left-sided deficit.  Stroke. EXAM: CT ANGIOGRAPHY HEAD AND NECK TECHNIQUE: Multidetector CT imaging of the head and neck was performed using the standard protocol during bolus administration of intravenous contrast. Multiplanar CT image reconstructions and MIPs were obtained to evaluate the vascular anatomy. Carotid stenosis measurements (when applicable) are obtained utilizing NASCET criteria, using the distal internal carotid diameter as the denominator. CONTRAST:  65m OMNIPAQUE IOHEXOL 350 MG/ML SOLN COMPARISON:  CT head 05/02/2021.  CT angio head and neck 10/13/2020 FINDINGS: CTA NECK FINDINGS Aortic arch: Atherosclerotic calcification aortic arch and proximal great vessels without significant stenosis. Left vertebral artery origin  from the arch. Extensive atherosclerotic disease in the proximal great vessels. Right carotid system: Atherosclerotic calcification throughout the right carotid bifurcation without significant stenosis. Left carotid system: Extensive atherosclerotic calcification left carotid bifurcation without significant stenosis. Atherosclerotic calcification throughout the left common carotid without stenosis. Vertebral arteries: Right vertebral artery dominant. Multiple areas of moderate stenosis in the mid right vertebral artery due to atherosclerotic calcification. Left vertebral artery origin from the arch without significant stenosis throughout its course. Skeleton: Cervical degenerative change. No acute skeletal abnormality. Other neck: Negative Upper chest: Advanced apical emphysema. Small loculated right pleural effusion. Apical scarring bilaterally. Review of the MIP images confirms the above findings CTA HEAD FINDINGS Anterior circulation: Moderate stenosis cavernous carotid bilaterally due to atherosclerotic calcification. Hypoplastic right A1 segment. Both anterior cerebral arteries supplied from the left and patent. M1 segment patent bilaterally without stenosis. Atherosclerotic irregularity right M2 and M3 branches. There appears to be an occlusion of a small right M2/M3 branch which was patent on the prior study. Atherosclerotic irregularity in left M2 and M3 branches without occlusion. Posterior circulation: Right vertebral artery dominant with  mild atherosclerotic disease distally. Right PICA patent. Left vertebral artery patent to PICA with minimal contribution to the basilar. Basilar patent without stenosis. Superior cerebellar and posterior cerebral arteries patent bilaterally without significant stenosis. Venous sinuses: Normal venous enhancement Anatomic variants: None Review of the MIP images confirms the above findings IMPRESSION: 1. Extensive atherosclerotic disease in the aortic arch and proximal great  vessels 2. Atherosclerotic disease in the carotid bifurcation without stenosis bilaterally 3. Moderate stenosis proximal and mid right vertebral artery due to atherosclerotic disease. Left vertebral artery is small but without significant disease. 4. Moderate stenosis in the cavernous carotid bilaterally due to atherosclerotic disease 5. Atherosclerotic disease in the middle cerebral arteries bilaterally. There appears to be a small branch occlusion of the right middle cerebral artery involving right M2/M3 branch. This appeared patent on prior CTA October 2021. 6. Code stroke imaging results were communicated on 05/02/2021 at 5:19 pm to provider Lindzen via text page Electronically Signed   By: Franchot Gallo M.D.   On: 05/02/2021 17:20   DG Chest Port 1 View  Result Date: 05/02/2021 CLINICAL DATA:  Respiratory failure EXAM: PORTABLE CHEST 1 VIEW COMPARISON:  04/12/2021, chest CT 01/09/2021 FINDINGS: Emphysematous disease. Patchy bilateral ground-glass opacities. Probable small pleural effusions. Stable cardiomediastinal silhouette. No pneumothorax. IMPRESSION: 1. Cardiomegaly with vascular congestion and patchy bilateral ground-glass opacities which could be secondary to pneumonia to include atypical/viral process versus pulmonary edema. There are probable small pleural effusions. 2. Emphysema Electronically Signed   By: Donavan Foil M.D.   On: 05/02/2021 19:58   DG CHEST PORT 1 VIEW  Result Date: 04/12/2021 CLINICAL DATA:  CHF EXAM: PORTABLE CHEST 1 VIEW COMPARISON:  04/10/2021 FINDINGS: Cardiac shadow is enlarged but stable. Aortic calcifications are again seen. Persistent vascular congestion is noted although the degree of interstitial edema has improved from the prior exam. No sizable effusion is noted. No bony abnormality is seen. IMPRESSION: Improving edema when compared with the prior exam. Electronically Signed   By: Inez Catalina M.D.   On: 04/12/2021 15:54   DG Chest Port 1 View  Result Date:  04/10/2021 CLINICAL DATA:  CHF EXAM: PORTABLE CHEST 1 VIEW COMPARISON:  04/09/2021 FINDINGS: Interval worsening of bilateral airspace opacity. Heart size within normal limits. No significant pulmonary vascular congestion. IMPRESSION: Interval worsening of diffuse bilateral airspace opacities which may be due to CHF/fluid volume overload or multifocal pneumonia. Electronically Signed   By: Miachel Roux M.D.   On: 04/10/2021 07:58   DG Chest Port 1 View  Result Date: 04/09/2021 CLINICAL DATA:  Dyspnea. EXAM: PORTABLE CHEST 1 VIEW COMPARISON:  03/13/2021 FINDINGS: Lungs are hyperexpanded. Interstitial markings are diffusely coarsened with chronic features. Basilar predominant alveolar opacity again noted, less pronounced than on the prior study. No substantial pleural effusion. Cardiopericardial silhouette is at upper limits of normal for size. Bones are diffusely demineralized. IMPRESSION: Emphysema with persistent basilar predominant alveolar opacity, less pronounced than on the prior study. Imaging features suggest edema superimposed on chronic underlying interstitial lung disease. Electronically Signed   By: Misty Stanley M.D.   On: 04/09/2021 15:58   CT HEAD CODE STROKE WO CONTRAST  Result Date: 05/02/2021 CLINICAL DATA:  Code stroke. Acute neuro deficit. Left-sided deficit. EXAM: CT HEAD WITHOUT CONTRAST TECHNIQUE: Contiguous axial images were obtained from the base of the skull through the vertex without intravenous contrast. COMPARISON:  MRI head 10/13/2020 FINDINGS: Brain: Generalized atrophy. Mild white matter hypodensity bilaterally appears chronic. Negative for acute infarct, hemorrhage, mass. Vascular: Negative for  hyperdense vessel Skull: Negative Sinuses/Orbits: Mucosal edema and air-fluid level left maxillary sinus with associated bony thickening. Small air-fluid level sphenoid sinus. Right mastoid tip effusion which was also noted in 2021. There is dehiscence of the anterior mastoid tip  extending into the external auditory canal. Possible neoplasm or cholesteatoma. No change from CT angio head 10/13/2020 Other: None ASPECTS [facility] Stroke Program Early CT Score) - Ganglionic level infarction (caudate, lentiform nuclei, internal capsule, insula, M1-M3 cortex): 7 - Supraganglionic infarction (M4-M6 cortex): 3 Total score (0-10 with 10 being normal): 10 IMPRESSION: 1. Generalized atrophy.  No acute intracranial abnormality. 2. ASPECTS is 10 3. Right mastoid opacification with bony dehiscence of the anterior wall of mastoid tip extending into the external auditory canal. Possible cholesteatoma or neoplasm. Recommend physical exam of the right external auditory canal. 4. Code stroke imaging results were communicated on 05/02/2021 at 4:56 pm to provider [place]zen via text page Electronically Signed   By: Franchot Gallo M.D.   On: 05/02/2021 16:57      HISTORY OF PRESENT ILLNESS Domanick Cuccia is an 75 y.o. male with a medical history significant for coronary artery disease s/p stenting earlier this year following an NSTEMI, paroxysmal atrial fibrillation not on home anticoagulation, hypertension, peripheral vascular disease, type 2 diabetes mellitus, vitamin B12 deficiency, COPD, chronic hypoxic respiratory failure on home oxygen, and remote history of alcoholism and tobacco use who presented today via EMS after being found by his son-in-law to have left hemiparesis, left facial droop, and dysarthria. Patient states that he was being picked up by his son-in-law to be transported to a hospice facility due to complications with his emphysema and diabetes. Of note, Mr. Livingood has had at least one hospital admission per month this year with worsening respiratory and cardiac status. He has been living independently and able to complete ADLs without complication until the last few days. His daughter states that over the last few days he has become progressively weak and required assistance with his ADLs  prompting him to seek hospice care as she and her family are unable to care for him.   Last Known Well: 15:30  tPA: given at 16:57 IR: Not an endovascular candidate due to DNR/DNI status, as mechanical thrombectomy would require intubation for anesthesia; family would not consider rescinding DNR for procedure mRS: 0 until several days prior to presentation, subsequently deteriorated to an mRS of [place] Mr. Cyan Moultrie is a 75 y.o. male with history of coronary artery disease s/p stentingearlier this yearfollowing an NSTEMI, paroxysmal atrial fibrillation not on home anticoagulation, hypertension, peripheral vascular disease, type 2 diabetes mellitus, vitamin B12 deficiency, COPD, chronic hypoxic respiratory failure on home oxygen presenting with acute left sided weakness.  Concern for acute stroke.  S/p tPA.  Not a candidate for thrombectomy due to DNR status. Family working with facility for hospice admission d/t declining respiratory status, when he had acute neuro deficits which brought him to the hospital. Post tPA, pt lethargic with mild dysarthira, L facial droop, LUE drift, RUE 4/5, BLE weakness. Palliative care involved and after discussion pt/family agreed to continue pursing residential placement.   Stroke right MCA stroke embolic from AF w/ right M2/M3 occlusion s/p tPA  CT head No acute abnormality. Atrophy. ASPECTS 10.  CTA head & neck: multifocal intracranial stenoses - R VA siphon, B MCA, right M2/M3 occlusion  2D Echo (2/22): EF 60-65%, mild LVH, grade 1 diastolic dysfunction  LDL 46  HgbA1c 8.2  aspirin  81 mg daily prior to admission, now on No antithrombotic.   Disposition:  Residential hospice   History of stroke  09/2020 with left lower extremity weakness and numbness, MRI showed bilateral ACAs, left MCA/ACA and MCA/PCA infarcts.  CT head and neck no large vessel occlusion.  EF 60 to 65%.  LDL 42 and A1c 6.1.  He was concerning for occult A. fib  given persistent atrial tachycardia, recommend outpatient 30-day monitor and the loop recorder.  He was discharged with triple antiplatelet therapy and Lipitor 40.  Chronic respiratory failure with hypoxia   on home O2    ongoing admissions  Patient and family requested comfort care measures, pending resident hospice  Hypertension  Home meds:  Zebeta 53m daily  Paroxysmal Atrial Fibrillation  Persistent atrial tachycardia on previous admissions, no afib  New EKG this admission showed afib   Not on anticoagulation at home  Hyperlipidemia  Home meds:  Atorvastatin 475mdaily  LDL 46, goal < 70 following stroke   Diabetes type II Controlled  Home meds:  Jardiance 1032maily, aspart 20units bid, metformin 1000m59md  HgbA1c 8.2, goal < 7.0  Other Stroke Risk Factors  Advanced Age >/= 65  60igarette smoker remote quit 6 months ago, 30 pack-year history  Previous ETOH use, alcohol level <10  Hx stroke (October 2021) - bilateral ACAs, left MCA/ACA and MCA/PCA infarcts.  CT head and neck no large vessel occlusion.  EF 60 to 65%.  LDL 42 and A1c 6.1.  He was concerning for occult A. fib given persistent atrial tachycardia, recommend outpatient 30-day monitor and the loop recorder.  He was discharged with triple antiplatelet therapy and Lipitor 40.  Coronary artery disease s/p stent (01/13/2021), NSTEMI  Malnutrition of moderate degree, Body mass index is 22.9 kg/m.   Other Active Problems  PVD   GERD  Malignant neoplasm of prostate  Ascending thoracic aortic aneurysm  Restless leg syndrome: Pamelor 50mg75mly  DISCHARGE EXAM Blood pressure 111/85, pulse (!) 135, temperature 98.3 F (36.8 C), temperature source Oral, resp. rate (!) 21, height 6' (1.829 m), weight 76.6 kg, SpO2 (!) 79 %.   Head: Normocephalic  EENT:Normal external eye and conjunctiva.  Psych: Affect appropriate for situation Cardiovascular: Rapid rate and rhythm on cardiac monitor Lungs:  Tachypneic on nonrebreather,no excessive work ofbreathing.  Abdomen: Rounded, soft Extremities:Warm, dry and intact Musculoskeletal:no joint tenderness, deformity. Bilateral lower extremities with erythema and 2+ nonpitting edema Skin-Warm and dry, scaling erythema of bilateral lower extremities  patient lethargic, however open eyes on voice, orientated to time place and age, able to name and repeat, no aphasia, mild dysarthria.  No gaze palsy, visual field full, left facial droop, tongue midline.  Left upper extremity pronator drift.  Right upper extremity at least 4/5.  Bilateral lower extremity proximal 3/5, distal 4/5. Sensation symmetrical.  Finger-to-nose grossly intact bilaterally.   ASSESSMENT/PLAN Mr. RogerHiep Ollis 74 y.22 male with history of coronary artery disease s/p stentingearlier this yearfollowing an NSTEMI, paroxysmal atrial fibrillation not on home anticoagulation, hypertension, peripheral vascular disease, type 2 diabetes mellitus, vitamin B12 deficiency, COPD, chronic hypoxic respiratory failure on home oxygen presenting with acute left sided weakness.  Concern for acute stroke.  S/p tPA.  Not a candidate for thrombectomy due to DNR status.   Discharge Diet   Regular thin liquids  DISCHARGE PLAN  [facility]  [facility]  35 minutes  were spent preparing discharge.  Rosalin Hawking, MD PhD Stroke Neurology 05/04/2021 10:38 PM

## 2021-05-04 NOTE — Care Management (Signed)
Brandon [name] with [facility] can admit patient today.   Brandon [name] has signed consents.   Patient aware and agreeable. Nurse requesting PTAR for 4 pm and will order dinner tray early for patient. Patient aware.   PTAR called andNCM requested 4 pm. PTAR paperwork and DNR in chart.   Nurse to call report to [phone].  Magdalen Spatz RN

## 2021-05-31 DEATH — deceased

## 2021-08-28 ENCOUNTER — Other Ambulatory Visit: Payer: Self-pay

## 2021-08-28 NOTE — Patient Outreach (Signed)
[place] [facility]) Care Management  08/28/2021  Brandon [name] 1946-02-17 [id number]   No telephone outreach attempt to obtain mRS. As patient discharged to Hospice.   [place] Management Assistant [phone]

## 2022-10-14 IMAGING — DX DG CHEST 1V PORT
1 series · 1 of 1 positions shown · non-contrast
Comparison: October 13, 2020

CLINICAL DATA: Shortness of breath for 2 days.

EXAM:
PORTABLE CHEST 1 VIEW

[chest]
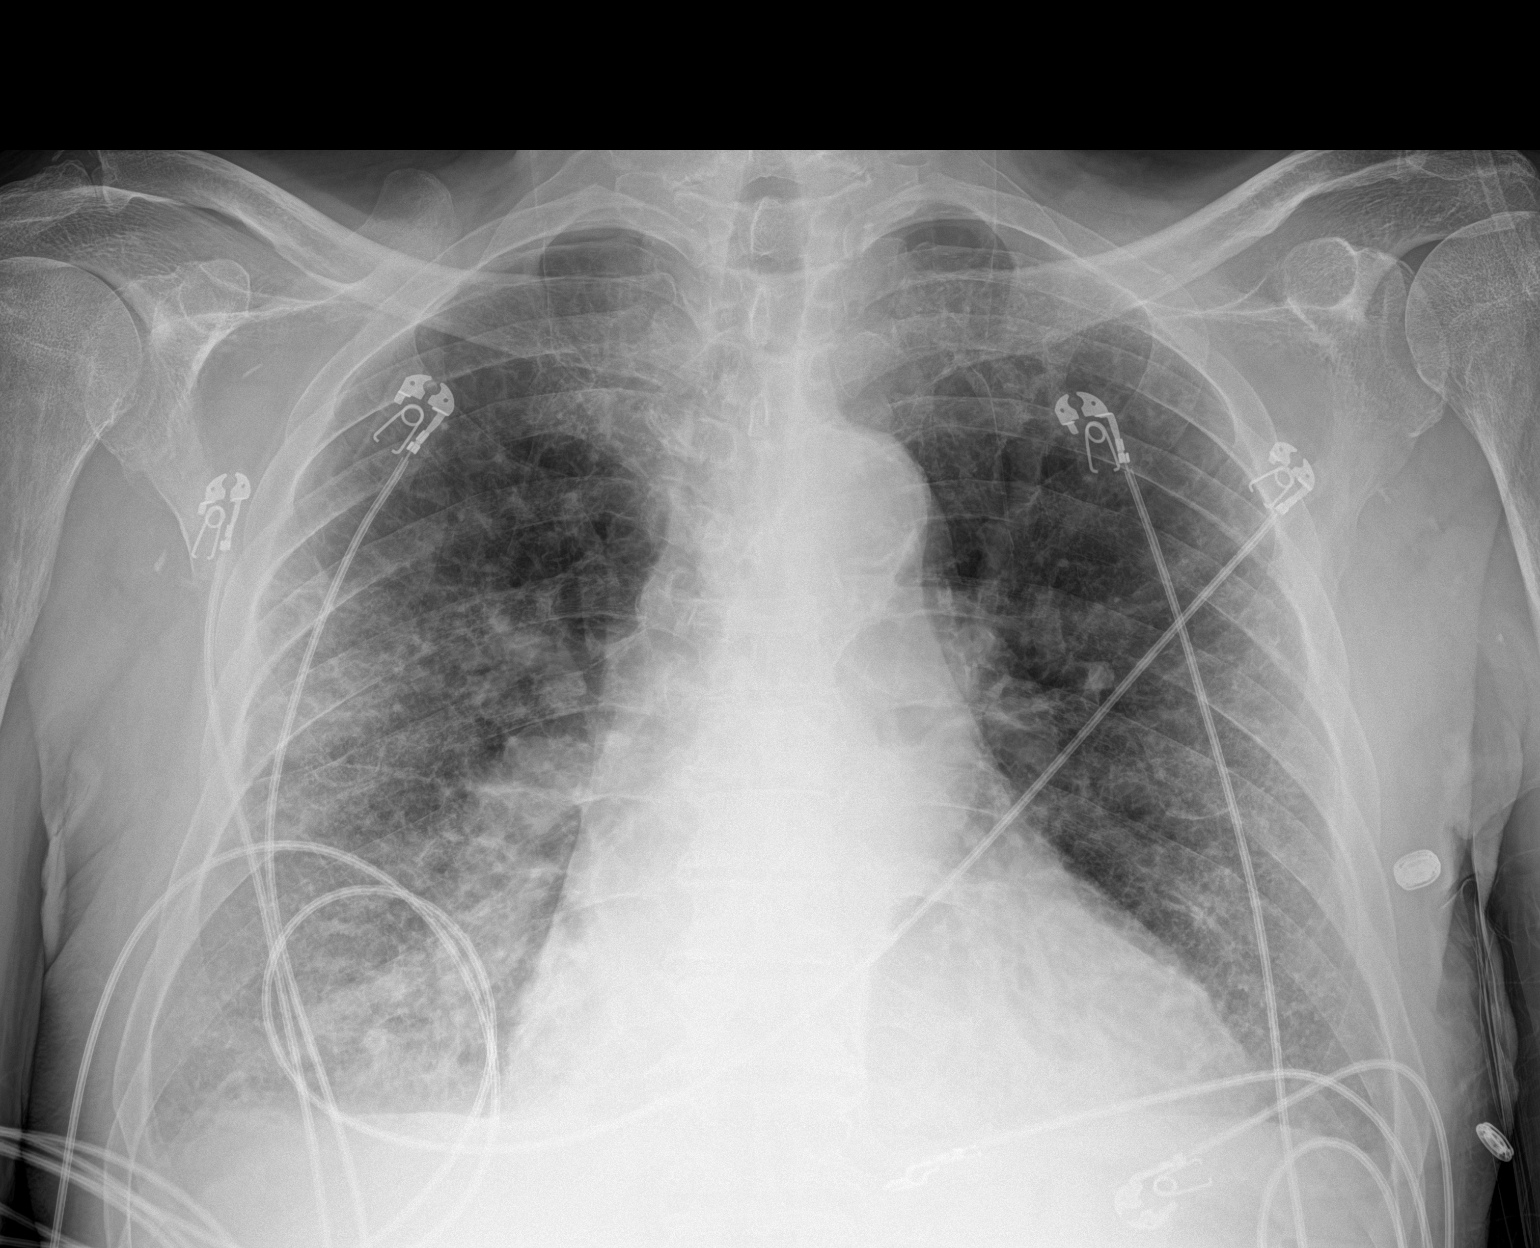

[1 of 1 positions shown; findings below may reference images not displayed]

FINDINGS: Increased bilateral interstitial lung markings in the right mid
lower lung in the periphery of the left mid lower lung. These
findings are not significantly changed since October 13, 2020. A CT
scan at that time demonstrated emphysematous changes without
infiltrate. No pneumothorax. No nodules or masses. Stable
cardiomegaly. The hila and mediastinum are unchanged.
IMPRESSION: No definite interval change in the appearance of the chest. Chronic
increased interstitial lung markings remain, particularly in the
right mid and lower lung, noted to represent emphysematous changes
on previous CT imaging. A subtle acute on chronic process may be
difficult to identify in the background of these chronic changes.

## 2023-02-06 IMAGING — CT CT HEAD CODE STROKE
3 of 4 series · 15 of 47 positions shown, 18 images · non-contrast
Comparison: MRI head 10/13/2020

CLINICAL DATA: Code stroke. Acute neuro deficit. Left-sided
deficit.

EXAM:
CT HEAD WITHOUT CONTRAST
TECHNIQUE: Contiguous axial images were obtained from the base of the skull
through the vertex without intravenous contrast.

[Series 4: head 3.0 mpr cor · coronal · 0.36mm/px · 3 of 73 slices shown]
[im 25/73  brain]
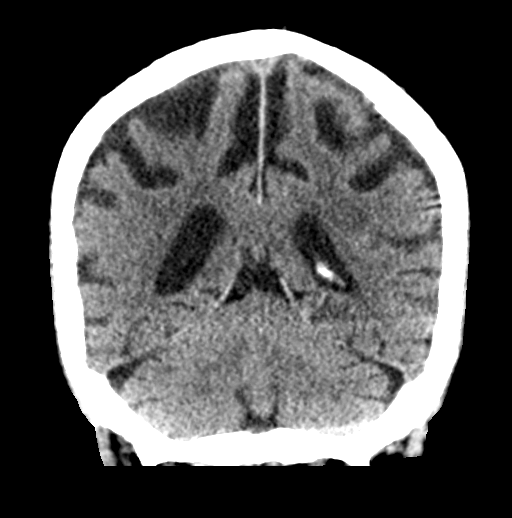
[im 33/73  brain]
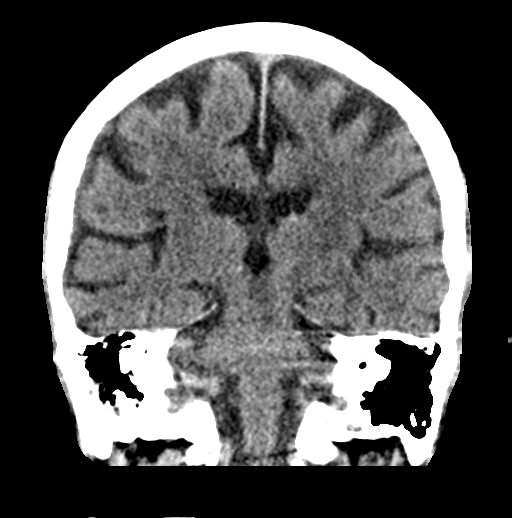
[im 41/73  brain]
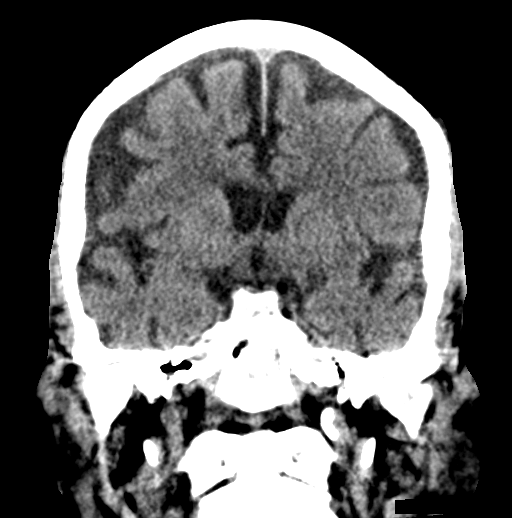

[Series 5: head 3.0 mpr sag · sagittal · 0.35mm/px · 3 of 63 slices shown]
[im 21/63  brain]
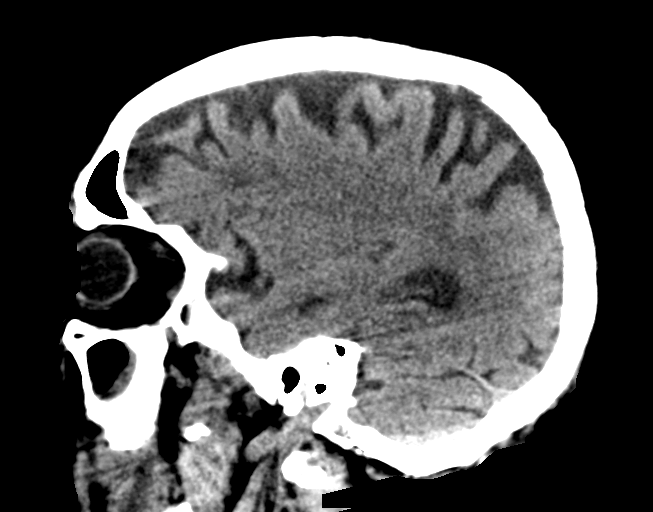
[im 32/63  brain]
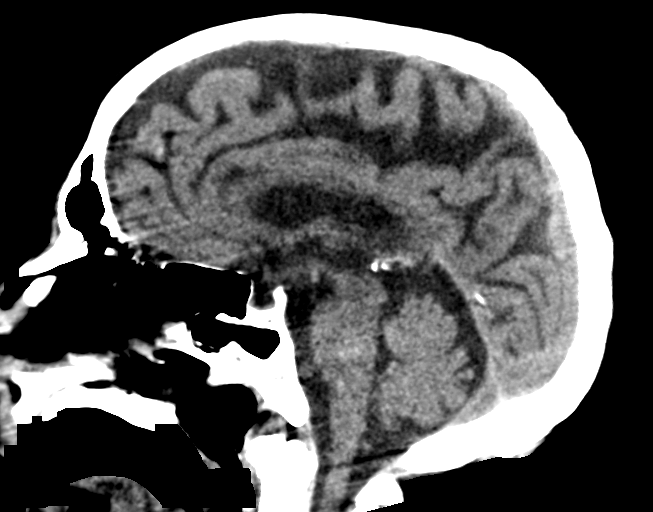
[im 42/63  brain]
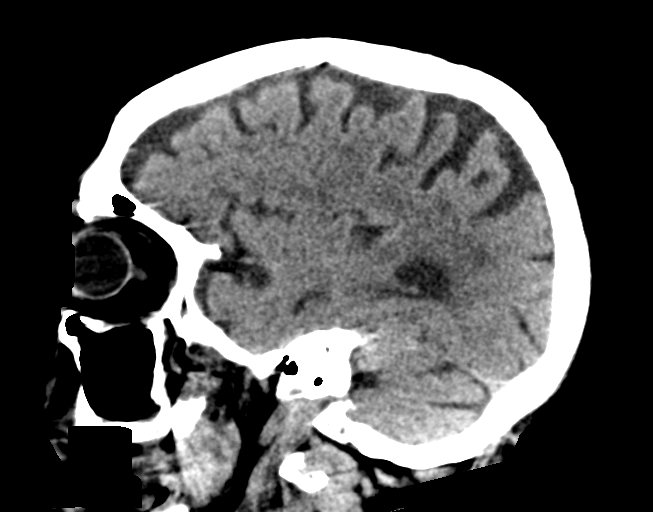

[Series 6: head 2.0 h70h · axial · 0.42mm/px · z∈[-176,-26]mm · 9 of 93 slices shown, 12 images]
[im 9/93  brain]
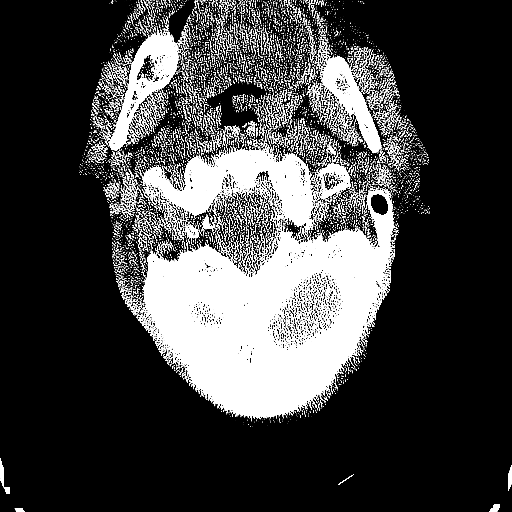
[im 9/93  bone]
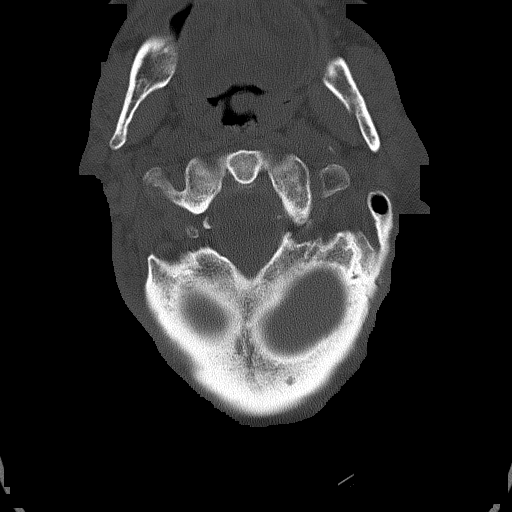
[im 18/93  brain]
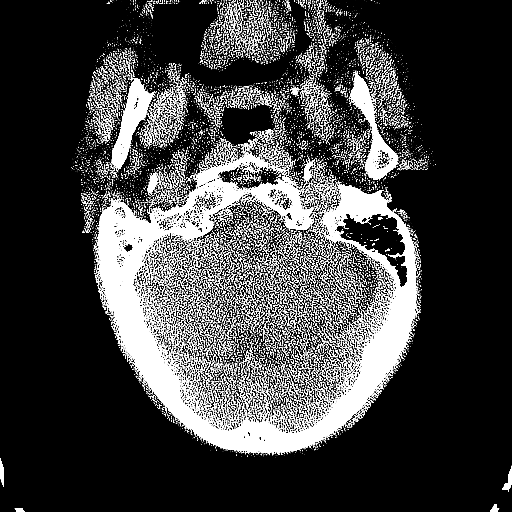
[im 27/93  brain]
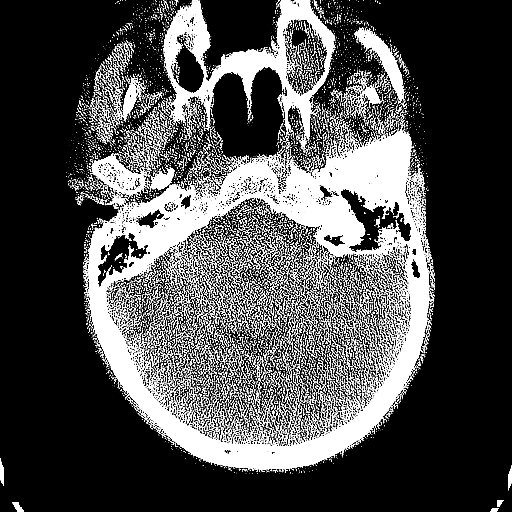
[im 36/93  brain]
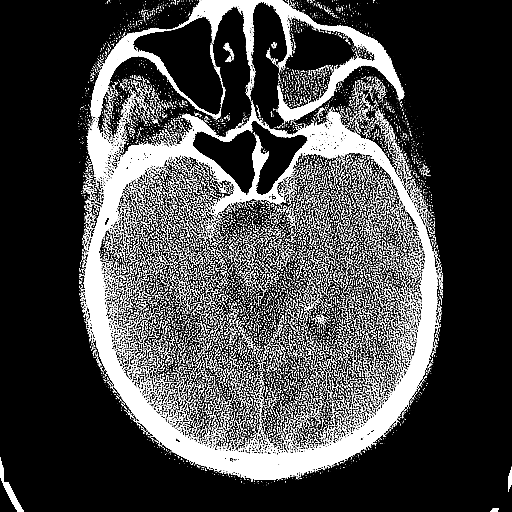
[im 49/93  brain]
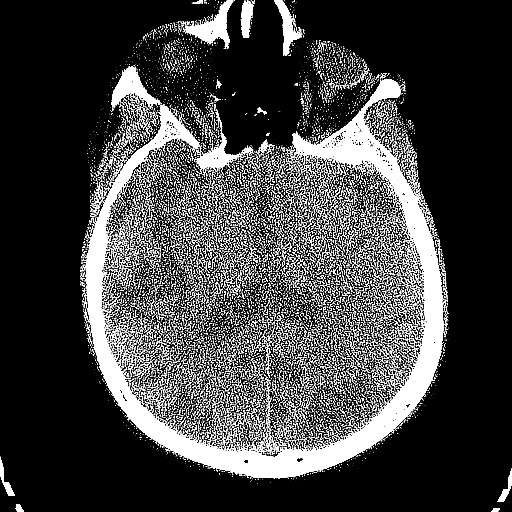
[im 49/93  bone]
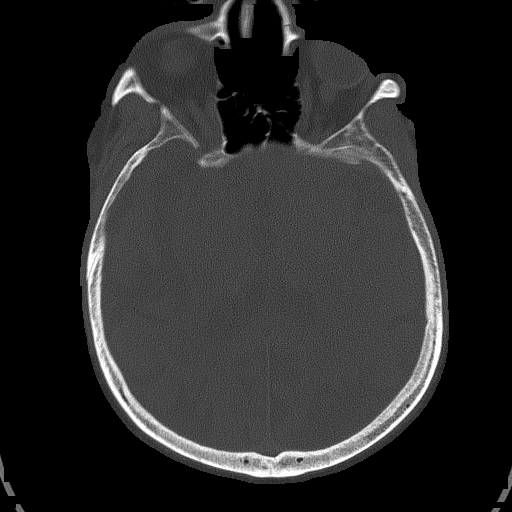
[im 57/93  brain]
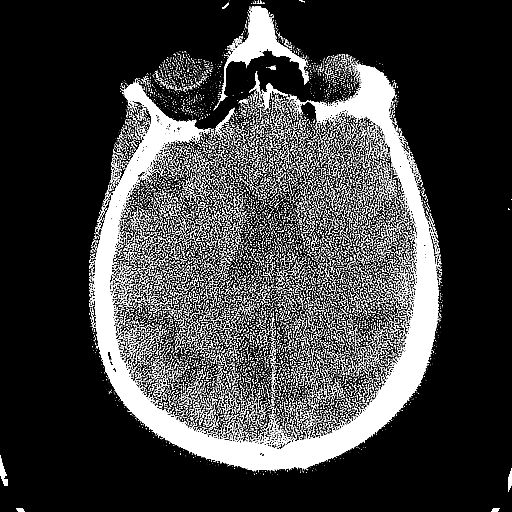
[im 66/93  brain]
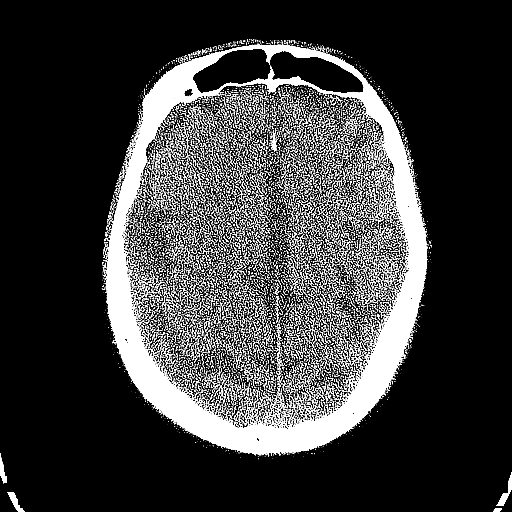
[im 75/93  brain]
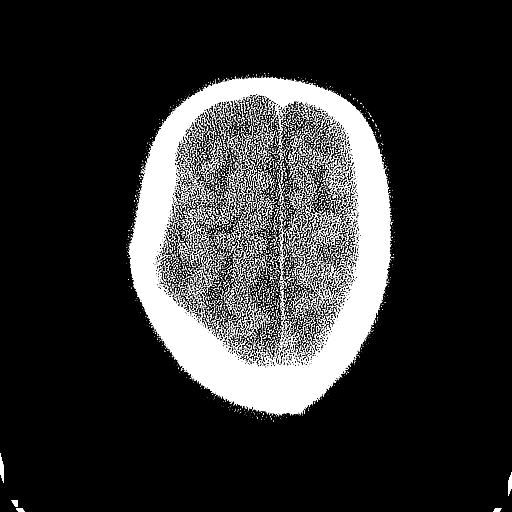
[im 84/93  brain]
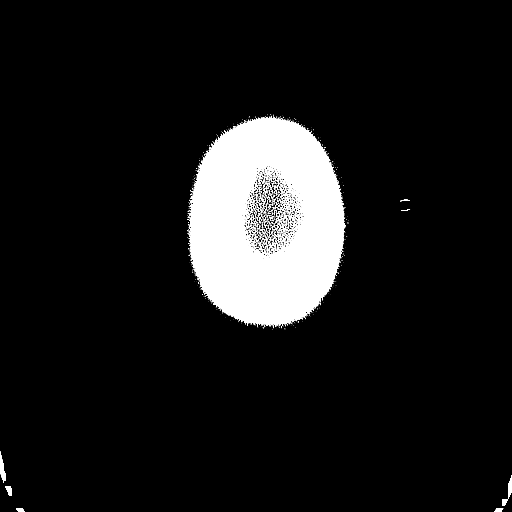
[im 84/93  bone]
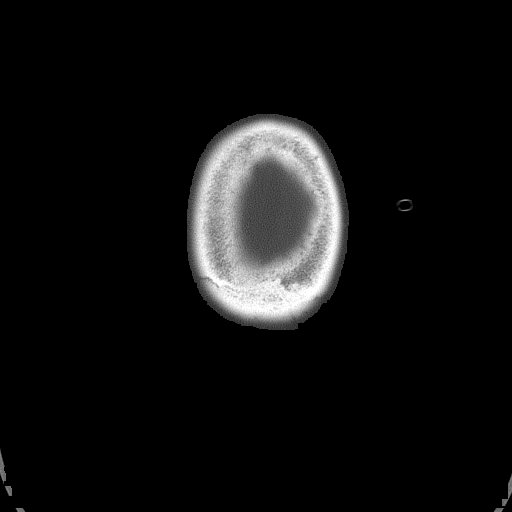

[15 of 47 positions shown; findings below may reference images not displayed]

FINDINGS: Brain: Generalized atrophy. Mild white matter hypodensity
bilaterally appears chronic.

Negative for acute infarct, hemorrhage, mass.

Vascular: Negative for hyperdense vessel

Skull: Negative

Sinuses/Orbits: Mucosal edema and air-fluid level left maxillary
sinus with associated bony thickening. Small air-fluid level
sphenoid sinus.

Right mastoid tip effusion which was also noted in 2322. There is
dehiscence of the anterior mastoid tip extending into the external
auditory canal. Possible neoplasm or cholesteatoma. No change from
CT angio head 10/13/2020

Other: None

ASPECTS (Alberta Stroke Program Early CT Score)

- Ganglionic level infarction (caudate, lentiform nuclei, internal
capsule, insula, M1-M3 cortex): 7

- Supraganglionic infarction (M4-M6 cortex): 3

Total score (0-10 with 10 being normal): 10
IMPRESSION: 1. Generalized atrophy.  No acute intracranial abnormality.
2. ASPECTS is 10
3. Right mastoid opacification with bony dehiscence of the anterior
wall of mastoid tip extending into the external auditory canal.
Possible cholesteatoma or neoplasm. Recommend physical exam of the
right external auditory canal.
pm to provider [place] via text page

## 2023-02-06 IMAGING — CT CT ANGIO HEAD
2 of 7 series · 8 of 47 positions shown · IV contrast (OMNI)
Comparison: CT head 05/02/2021.  CT angio head and neck 10/13/2020

CLINICAL DATA: Left-sided deficit.  Stroke.

EXAM:
CT ANGIOGRAPHY HEAD AND NECK
TECHNIQUE: Multidetector CT imaging of the head and neck was performed using
the standard protocol during bolus administration of intravenous
contrast. Multiplanar CT image reconstructions and MIPs were
obtained to evaluate the vascular anatomy. Carotid stenosis
measurements (when applicable) are obtained utilizing NASCET
criteria, using the distal internal carotid diameter as the
denominator.
CONTRAST:  75mL OMNIPAQUE IOHEXOL 350 MG/ML SOLN

[Series 5: carotid brain · axial · 0.59mm/px · z∈[-337,-55]mm · 6 of 198 slices shown]
[im 29/198  brain]
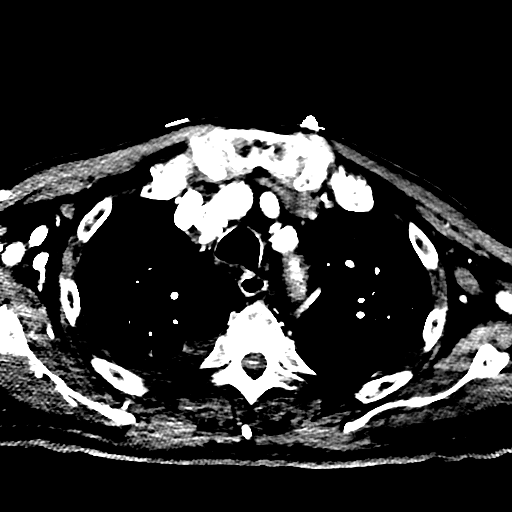
[im 57/198  bone]
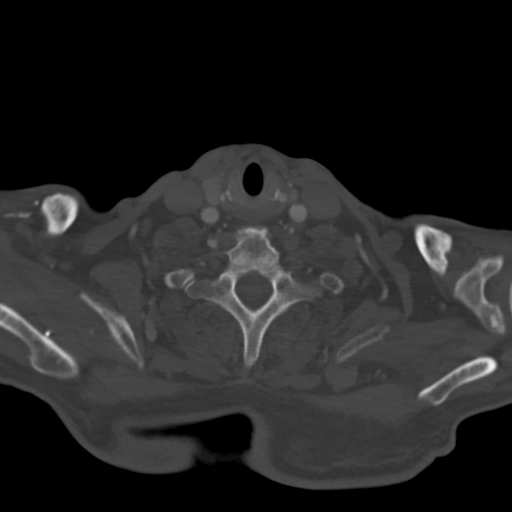
[im 85/198  brain]
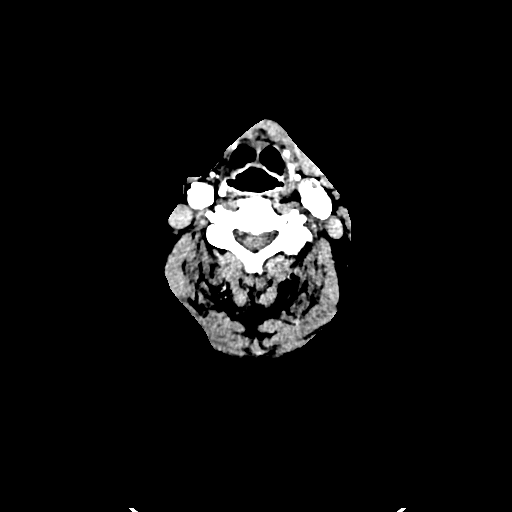
[im 113/198  bone]
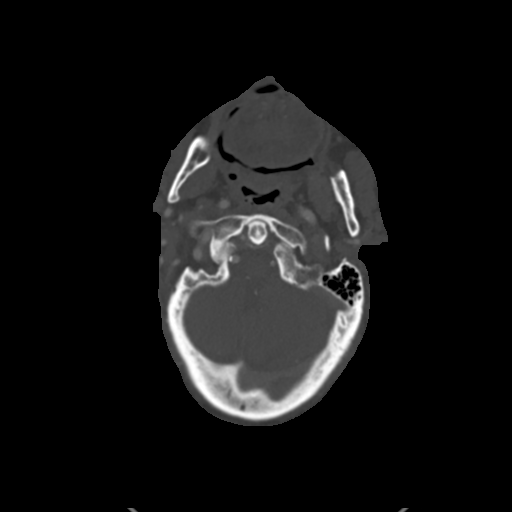
[im 141/198  brain]
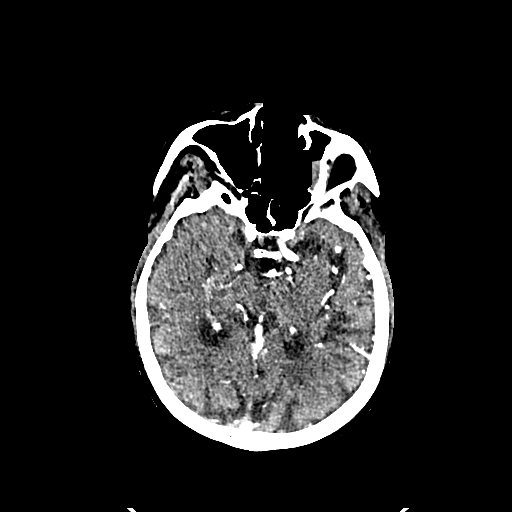
[im 169/198  bone]
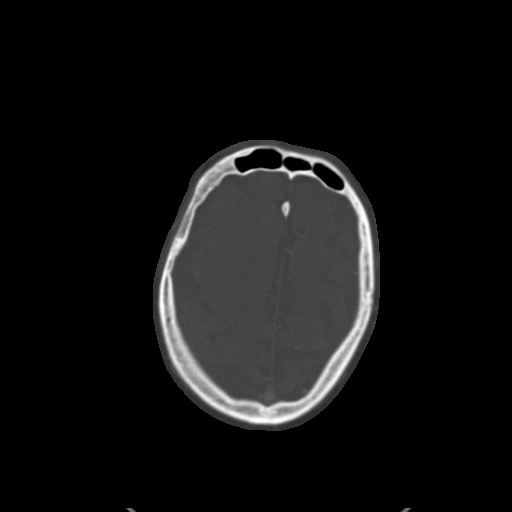

[Series 11: carotid mips · axial · 0.41mm/px · z∈[-268,-138]mm · 2 of 80 slices shown]
[im 27/80  brain]
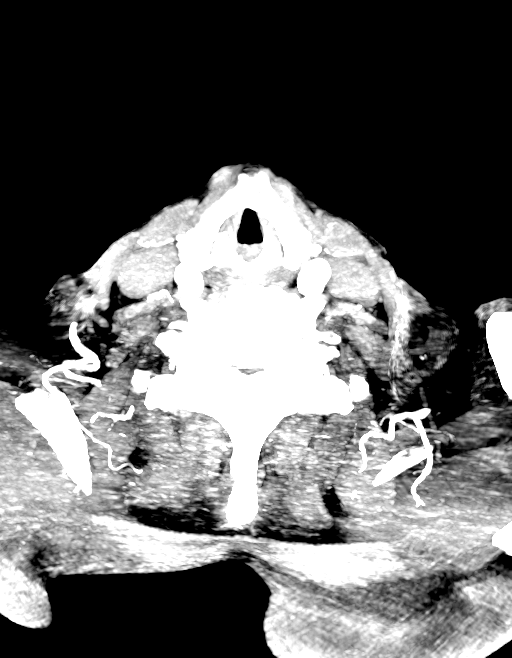
[im 53/80  brain]
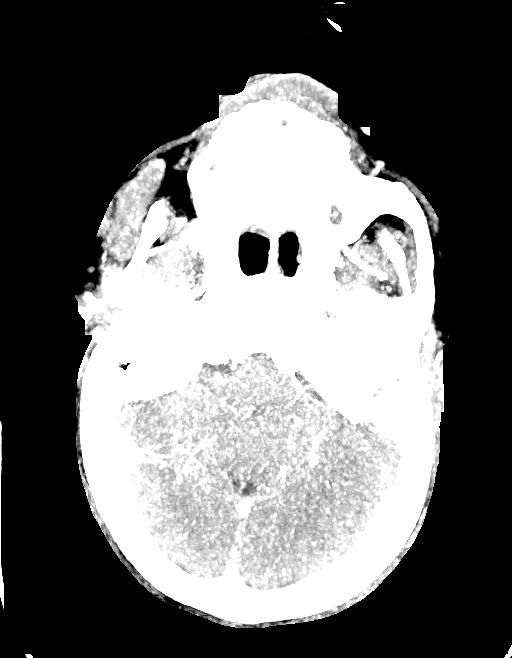

[8 of 47 positions shown; findings below may reference images not displayed]

FINDINGS: CTA NECK FINDINGS

Aortic arch: Atherosclerotic calcification aortic arch and proximal
great vessels without significant stenosis. Left vertebral artery
origin from the arch. Extensive atherosclerotic disease in the
proximal great vessels.

Right carotid system: Atherosclerotic calcification throughout the
right carotid bifurcation without significant stenosis.

Left carotid system: Extensive atherosclerotic calcification left
carotid bifurcation without significant stenosis. Atherosclerotic
calcification throughout the left common carotid without stenosis.

Vertebral arteries: Right vertebral artery dominant. Multiple areas
of moderate stenosis in the mid right vertebral artery due to
atherosclerotic calcification. Left vertebral artery origin from the
arch without significant stenosis throughout its course.

Skeleton: Cervical degenerative change. No acute skeletal
abnormality.

Other neck: Negative

Upper chest: Advanced apical emphysema. Small loculated right
pleural effusion. Apical scarring bilaterally.

Review of the MIP images confirms the above findings

CTA HEAD FINDINGS

Anterior circulation: Moderate stenosis cavernous carotid
bilaterally due to atherosclerotic calcification. Hypoplastic right
A1 segment. Both anterior cerebral arteries supplied from the left
and patent. M1 segment patent bilaterally without stenosis.
Atherosclerotic irregularity right M2 and M3 branches. There appears
to be an occlusion of a small right M2/M3 branch which was patent on
the prior study.

Atherosclerotic irregularity in left M2 and M3 branches without
occlusion.

Posterior circulation: Right vertebral artery dominant with mild
atherosclerotic disease distally. Right PICA patent. Left vertebral
artery patent to PICA with minimal contribution to the basilar.
Basilar patent without stenosis. Superior cerebellar and posterior
cerebral arteries patent bilaterally without significant stenosis.

Venous sinuses: Normal venous enhancement

Anatomic variants: None

Review of the MIP images confirms the above findings
IMPRESSION: 1. Extensive atherosclerotic disease in the aortic arch and proximal
great vessels
2. Atherosclerotic disease in the carotid bifurcation without
stenosis bilaterally
3. Moderate stenosis proximal and mid right vertebral artery due to
atherosclerotic disease. Left vertebral artery is small but without
significant disease.
4. Moderate stenosis in the cavernous carotid bilaterally due to
atherosclerotic disease
5. Atherosclerotic disease in the middle cerebral arteries
bilaterally. There appears to be a small branch occlusion of the
right middle cerebral artery involving right M2/M3 branch. This
appeared patent on prior CTA September 2020.
pm to provider [place] via text page
